# Patient Record
Sex: Male | Born: 1935 | Race: White | Hispanic: No | Marital: Married | Smoking: Never smoker
Health system: Southern US, Community
[De-identification: ages and names within clinical notes are randomized; demographics above are authoritative.]

## PROBLEM LIST (undated history)

## (undated) ENCOUNTER — Ambulatory Visit: Payer: Medicare Other | Source: Home / Self Care

## (undated) DIAGNOSIS — M1990 Unspecified osteoarthritis, unspecified site: Secondary | ICD-10-CM

## (undated) DIAGNOSIS — I4891 Unspecified atrial fibrillation: Secondary | ICD-10-CM

## (undated) DIAGNOSIS — C61 Malignant neoplasm of prostate: Secondary | ICD-10-CM

## (undated) DIAGNOSIS — K5790 Diverticulosis of intestine, part unspecified, without perforation or abscess without bleeding: Secondary | ICD-10-CM

## (undated) DIAGNOSIS — E785 Hyperlipidemia, unspecified: Secondary | ICD-10-CM

## (undated) DIAGNOSIS — U071 COVID-19: Secondary | ICD-10-CM

## (undated) DIAGNOSIS — N200 Calculus of kidney: Secondary | ICD-10-CM

## (undated) DIAGNOSIS — I639 Cerebral infarction, unspecified: Secondary | ICD-10-CM

## (undated) DIAGNOSIS — J189 Pneumonia, unspecified organism: Secondary | ICD-10-CM

## (undated) DIAGNOSIS — K635 Polyp of colon: Secondary | ICD-10-CM

## (undated) HISTORY — DX: Malignant neoplasm of prostate: C61

## (undated) HISTORY — PX: COLONOSCOPY W/ POLYPECTOMY: SHX1380

## (undated) HISTORY — DX: COVID-19: U07.1

## (undated) HISTORY — DX: Diverticulosis of intestine, part unspecified, without perforation or abscess without bleeding: K57.90

## (undated) HISTORY — DX: Polyp of colon: K63.5

## (undated) HISTORY — DX: Calculus of kidney: N20.0

## (undated) HISTORY — DX: Hyperlipidemia, unspecified: E78.5

## (undated) HISTORY — PX: CERVICAL SPINE SURGERY: SHX589

## (undated) HISTORY — PX: RETROPUBIC PROSTATECTOMY: SUR1055

## (undated) HISTORY — PX: OTHER SURGICAL HISTORY: SHX169

## (undated) HISTORY — PX: CATARACT EXTRACTION W/ INTRAOCULAR LENS  IMPLANT, BILATERAL: SHX1307

## (undated) HISTORY — PX: SHOULDER SURGERY: SHX246

---

## 1997-12-08 ENCOUNTER — Emergency Department (HOSPITAL_COMMUNITY): Admission: EM | Admit: 1997-12-08 | Discharge: 1997-12-08 | Payer: Self-pay | Admitting: Emergency Medicine

## 1999-10-10 ENCOUNTER — Ambulatory Visit (HOSPITAL_BASED_OUTPATIENT_CLINIC_OR_DEPARTMENT_OTHER): Admission: RE | Admit: 1999-10-10 | Discharge: 1999-10-10 | Payer: Self-pay | Admitting: Orthopaedic Surgery

## 2004-02-17 ENCOUNTER — Encounter: Payer: Self-pay | Admitting: Internal Medicine

## 2004-02-17 ENCOUNTER — Encounter: Admission: RE | Admit: 2004-02-17 | Discharge: 2004-02-17 | Payer: Self-pay | Admitting: Internal Medicine

## 2005-03-13 ENCOUNTER — Ambulatory Visit (HOSPITAL_COMMUNITY): Admission: RE | Admit: 2005-03-13 | Discharge: 2005-03-13 | Payer: Self-pay | Admitting: Family Medicine

## 2005-07-12 ENCOUNTER — Ambulatory Visit: Payer: Self-pay | Admitting: Internal Medicine

## 2006-04-08 ENCOUNTER — Ambulatory Visit: Payer: Self-pay | Admitting: Internal Medicine

## 2006-05-30 ENCOUNTER — Ambulatory Visit: Payer: Self-pay | Admitting: Internal Medicine

## 2006-10-31 ENCOUNTER — Ambulatory Visit: Payer: Self-pay | Admitting: Internal Medicine

## 2006-10-31 LAB — CONVERTED CEMR LAB
Rhuematoid fact SerPl-aCnc: <20 intl units/mL — ABNORMAL LOW (ref 0.0–20.0)
Sed Rate: 17 mm/hr (ref 0–20)
Total CK: 123 units/L (ref 7–195)
Uric Acid, Serum: 7 mg/dL (ref 2.4–7.0)

## 2006-11-26 ENCOUNTER — Ambulatory Visit: Payer: Self-pay | Admitting: Internal Medicine

## 2007-01-13 ENCOUNTER — Encounter: Payer: Self-pay | Admitting: Internal Medicine

## 2007-01-13 ENCOUNTER — Ambulatory Visit: Payer: Self-pay | Admitting: Internal Medicine

## 2007-08-12 ENCOUNTER — Encounter: Payer: Self-pay | Admitting: Internal Medicine

## 2007-10-09 DIAGNOSIS — Z8739 Personal history of other diseases of the musculoskeletal system and connective tissue: Secondary | ICD-10-CM | POA: Insufficient documentation

## 2007-10-10 ENCOUNTER — Ambulatory Visit: Payer: Self-pay | Admitting: Internal Medicine

## 2007-10-10 ENCOUNTER — Telehealth (INDEPENDENT_AMBULATORY_CARE_PROVIDER_SITE_OTHER): Payer: Self-pay | Admitting: *Deleted

## 2007-10-27 ENCOUNTER — Ambulatory Visit: Payer: Self-pay | Admitting: Internal Medicine

## 2007-10-28 ENCOUNTER — Encounter (INDEPENDENT_AMBULATORY_CARE_PROVIDER_SITE_OTHER): Payer: Self-pay | Admitting: *Deleted

## 2008-01-19 ENCOUNTER — Ambulatory Visit: Payer: Self-pay | Admitting: Internal Medicine

## 2008-01-20 ENCOUNTER — Encounter (INDEPENDENT_AMBULATORY_CARE_PROVIDER_SITE_OTHER): Payer: Self-pay | Admitting: *Deleted

## 2008-01-29 ENCOUNTER — Ambulatory Visit: Payer: Self-pay | Admitting: Pulmonary Disease

## 2008-02-03 ENCOUNTER — Telehealth (INDEPENDENT_AMBULATORY_CARE_PROVIDER_SITE_OTHER): Payer: Self-pay | Admitting: *Deleted

## 2008-05-07 DIAGNOSIS — Z87442 Personal history of urinary calculi: Secondary | ICD-10-CM | POA: Insufficient documentation

## 2008-05-07 DIAGNOSIS — D126 Benign neoplasm of colon, unspecified: Secondary | ICD-10-CM | POA: Insufficient documentation

## 2008-05-07 DIAGNOSIS — K5730 Diverticulosis of large intestine without perforation or abscess without bleeding: Secondary | ICD-10-CM | POA: Insufficient documentation

## 2008-05-10 ENCOUNTER — Ambulatory Visit: Payer: Self-pay | Admitting: Internal Medicine

## 2008-06-21 ENCOUNTER — Ambulatory Visit: Payer: Self-pay | Admitting: Internal Medicine

## 2008-06-21 DIAGNOSIS — Z860100 Personal history of colon polyps, unspecified: Secondary | ICD-10-CM | POA: Insufficient documentation

## 2008-06-21 DIAGNOSIS — Z8546 Personal history of malignant neoplasm of prostate: Secondary | ICD-10-CM | POA: Insufficient documentation

## 2008-06-21 DIAGNOSIS — E785 Hyperlipidemia, unspecified: Secondary | ICD-10-CM

## 2008-06-21 DIAGNOSIS — E1169 Type 2 diabetes mellitus with other specified complication: Secondary | ICD-10-CM | POA: Insufficient documentation

## 2008-06-21 DIAGNOSIS — R9431 Abnormal electrocardiogram [ECG] [EKG]: Secondary | ICD-10-CM | POA: Insufficient documentation

## 2008-06-22 ENCOUNTER — Encounter (INDEPENDENT_AMBULATORY_CARE_PROVIDER_SITE_OTHER): Payer: Self-pay | Admitting: *Deleted

## 2008-06-25 ENCOUNTER — Ambulatory Visit: Payer: Self-pay | Admitting: Cardiology

## 2008-07-07 ENCOUNTER — Encounter: Payer: Self-pay | Admitting: Cardiology

## 2008-07-07 ENCOUNTER — Encounter: Payer: Self-pay | Admitting: Internal Medicine

## 2008-07-07 ENCOUNTER — Ambulatory Visit: Payer: Self-pay

## 2008-07-13 ENCOUNTER — Ambulatory Visit: Payer: Self-pay | Admitting: Cardiology

## 2008-07-28 ENCOUNTER — Ambulatory Visit: Payer: Self-pay | Admitting: Internal Medicine

## 2008-09-03 ENCOUNTER — Encounter: Payer: Self-pay | Admitting: Internal Medicine

## 2008-09-21 ENCOUNTER — Ambulatory Visit: Payer: Self-pay | Admitting: Internal Medicine

## 2008-09-21 ENCOUNTER — Encounter (INDEPENDENT_AMBULATORY_CARE_PROVIDER_SITE_OTHER): Payer: Self-pay | Admitting: *Deleted

## 2008-09-21 LAB — CONVERTED CEMR LAB
OCCULT 1: NEGATIVE
OCCULT 2: NEGATIVE
OCCULT 3: NEGATIVE

## 2008-12-01 ENCOUNTER — Encounter: Payer: Self-pay | Admitting: Internal Medicine

## 2009-04-07 ENCOUNTER — Emergency Department (HOSPITAL_COMMUNITY): Admission: EM | Admit: 2009-04-07 | Discharge: 2009-04-07 | Payer: Self-pay | Admitting: Emergency Medicine

## 2009-04-26 ENCOUNTER — Ambulatory Visit: Payer: Self-pay | Admitting: Internal Medicine

## 2009-06-10 ENCOUNTER — Encounter: Payer: Self-pay | Admitting: Internal Medicine

## 2009-06-21 ENCOUNTER — Emergency Department (HOSPITAL_COMMUNITY): Admission: EM | Admit: 2009-06-21 | Discharge: 2009-06-22 | Payer: Self-pay | Admitting: Emergency Medicine

## 2009-07-14 ENCOUNTER — Encounter: Admission: RE | Admit: 2009-07-14 | Discharge: 2009-07-14 | Payer: Self-pay | Admitting: Family Medicine

## 2009-07-25 ENCOUNTER — Encounter: Admission: RE | Admit: 2009-07-25 | Discharge: 2009-10-23 | Payer: Self-pay | Admitting: Family Medicine

## 2009-10-03 ENCOUNTER — Encounter: Payer: Self-pay | Admitting: Internal Medicine

## 2009-10-07 ENCOUNTER — Encounter: Payer: Self-pay | Admitting: Internal Medicine

## 2009-10-11 ENCOUNTER — Ambulatory Visit: Payer: Self-pay | Admitting: Internal Medicine

## 2009-10-11 ENCOUNTER — Telehealth (INDEPENDENT_AMBULATORY_CARE_PROVIDER_SITE_OTHER): Payer: Self-pay | Admitting: *Deleted

## 2009-10-11 DIAGNOSIS — M109 Gout, unspecified: Secondary | ICD-10-CM | POA: Insufficient documentation

## 2009-10-12 LAB — CONVERTED CEMR LAB: Uric Acid, Serum: 8.8 mg/dL — ABNORMAL HIGH (ref 4.0–7.8)

## 2009-10-13 ENCOUNTER — Inpatient Hospital Stay (HOSPITAL_COMMUNITY): Admission: RE | Admit: 2009-10-13 | Discharge: 2009-10-14 | Payer: Self-pay | Admitting: Neurological Surgery

## 2009-11-03 ENCOUNTER — Encounter: Payer: Self-pay | Admitting: Internal Medicine

## 2009-11-30 ENCOUNTER — Encounter: Payer: Self-pay | Admitting: Internal Medicine

## 2010-04-19 ENCOUNTER — Ambulatory Visit: Payer: Self-pay | Admitting: Internal Medicine

## 2010-06-30 ENCOUNTER — Encounter: Payer: Self-pay | Admitting: Internal Medicine

## 2010-08-20 LAB — CONVERTED CEMR LAB
ALT: 29 units/L (ref 0–53)
AST: 26 units/L (ref 0–37)
Albumin: 4.2 g/dL (ref 3.5–5.2)
Alkaline Phosphatase: 37 units/L — ABNORMAL LOW (ref 39–117)
BUN: 23 mg/dL (ref 6–23)
Basophils Absolute: 0.1 10*3/uL (ref 0.0–0.1)
Basophils Relative: 0.7 % (ref 0.0–3.0)
Bilirubin Urine: NEGATIVE
Bilirubin, Direct: 0.1 mg/dL (ref 0.0–0.3)
Blood in Urine, dipstick: NEGATIVE
CO2: 29 meq/L (ref 19–32)
Calcium: 9.3 mg/dL (ref 8.4–10.5)
Chloride: 106 meq/L (ref 96–112)
Cholesterol, target level: 200 mg/dL
Cholesterol: 179 mg/dL (ref 0–200)
Creatinine, Ser: 1.2 mg/dL (ref 0.4–1.5)
Eosinophils Absolute: 0.2 10*3/uL (ref 0.0–0.7)
Eosinophils Relative: 2.8 % (ref 0.0–5.0)
GFR calc Af Amer: 76 mL/min
GFR calc non Af Amer: 63 mL/min
Glucose, Bld: 99 mg/dL (ref 70–99)
Glucose, Urine, Semiquant: NEGATIVE
HCT: 48.4 % (ref 39.0–52.0)
HDL goal, serum: 40 mg/dL
HDL: 43.2 mg/dL (ref >39.0)
Hemoglobin: 16.9 g/dL (ref 13.0–17.0)
Ketones, urine, test strip: NEGATIVE
LDL Cholesterol: 113 mg/dL — ABNORMAL HIGH (ref 0–99)
LDL Goal: 110 mg/dL
Lymphocytes Relative: 34.4 % (ref 12.0–46.0)
MCHC: 34.9 g/dL (ref 30.0–36.0)
MCV: 92 fL (ref 78.0–100.0)
Monocytes Absolute: 0.6 10*3/uL (ref 0.1–1.0)
Monocytes Relative: 8.5 % (ref 3.0–12.0)
Neutro Abs: 4 10*3/uL (ref 1.4–7.7)
Neutrophils Relative %: 53.6 % (ref 43.0–77.0)
Nitrite: NEGATIVE
Platelets: 175 10*3/uL (ref 150–400)
Potassium: 4.4 meq/L (ref 3.5–5.1)
Protein, U semiquant: 30
RBC: 5.26 M/uL (ref 4.22–5.81)
RDW: 11.9 % (ref 11.5–14.6)
Sodium: 141 meq/L (ref 135–145)
Specific Gravity, Urine: 1.02
TSH: 1.29 microintl units/mL (ref 0.35–5.50)
Total Bilirubin: 1.3 mg/dL — ABNORMAL HIGH (ref 0.3–1.2)
Total CHOL/HDL Ratio: 4.1
Total Protein: 7.5 g/dL (ref 6.0–8.3)
Triglycerides: 116 mg/dL (ref 0–149)
Uric Acid, Serum: 7.7 mg/dL (ref 4.0–7.8)
Urobilinogen, UA: NEGATIVE
VLDL: 23 mg/dL (ref 0–40)
WBC Urine, dipstick: NEGATIVE
WBC: 7.4 10*3/uL (ref 4.5–10.5)
pH: 6

## 2010-08-24 NOTE — Letter (Signed)
Summary: [facility]  [facility]   Imported By: Lanelle Bal 12/15/2009 11:45:02  _____________________________________________________________________  External Attachment:    Type:   Image     Comment:   External Document

## 2010-08-24 NOTE — Assessment & Plan Note (Signed)
Summary: LITTLE THINGS THAT NEED TO BE CUT OFF/YF    History of Present Illness Visit Type: follow up Primary GI MD: Yancey Flemings MD Primary Provider: Marga Melnick, MD Chief Complaint: anal tag History of Present Illness:   77 year old gentleman with a history of prostate cancer, hyperlipidemia, and colon polyps. Also has a history of anal fissure with anal tag. He presents today complains of occasional discomfort or bleeding per rectum as well as the obvious presence of the anal tag and difficulty cleaning the rectum post defecation. He has these problems but once per week. He is on no medical therapy. His last colonoscopy was performed January 13, 2007. At that time he was noted to have multiple diminutive polyps and sigmoid diverticulosis. In addition internal hemorrhoids. Followup in 5 years recommended. Patient has no other symptoms or complaints at this time.             Prior Medications Reviewed Using: Medication Bottles  Updated Prior Medication List: PRAVASTATIN SODIUM 40 MG  TABS (PRAVASTATIN SODIUM) 1 by mouth once daily * IBUPROFEN 600MG  take by mouth as needed * CLARITIN 10MG  1 by mouth once daily * MUCINEX as needed SINGULAIR 10 MG  TABS (MONTELUKAST SODIUM) 1 once daily TYLENOL EX ST ARTHRITIS PAIN 500 MG TABS (ACETAMINOPHEN) Take 4 times weekly prn  Current Allergies: No known allergies   Past Medical History:    Reviewed history from 10/10/2007 and no changes required:       Prostate Cancer       Gout       elevated homocysteine level       elevated lipids  Past Surgical History:    Reviewed history from 05/07/2008 and no changes required:       Left Shoulder surgery       Prostate Biopsy       Radical Retropubic Prostatecomy   Family History:    Reviewed history from 01/29/2008 and no changes required:       cancer: father (kidney) brother (unsure what type)       sister MI age 45       brother MI age 87, diabetes  Social History:    Reviewed  history from 01/29/2008 and no changes required:       Never Smoked       Alcohol use-no       pt is married.       pt has children.     Vital Signs:  Patient Profile:   77 Years Old Male Height:     72 inches Weight:      205.50 pounds BMI:     27.97 Pulse rate:   72 / minute Pulse rhythm:   regular BP sitting:   120 / 78  (left arm)  Vitals Entered By: June McMurray CMA (May 10, 2008 10:03 AM)                  Physical Exam  General:     Well developed, well nourished, no acute distress. Head:     Normocephalic and atraumatic. Eyes:     PERRLA, no icterus. Abdomen:     Soft, nontender and nondistended. No masses, hepatosplenomegaly or hernias noted. Normal bowel sounds. Rectal:     rectal exam reveals a benign anal tag as well as a healed posterior fissure. No other abnormalities. Pulses:     Normal pulses noted. Neurologic:     Alert and  oriented x4;   Psych:  Alert and cooperative. Normal mood and affect.    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  Plan: #1. Daily fiber supplementation with Metamucil 2 tablespoons and 14 ounces of water or juice #2. Anusol suppositories q.h.s. p.r.n. #3. Literature on anal fissure provided #4. If patient fails to respond to medical therapy, consider surgical referral.  Problem # 2:  RECTAL BLEEDING (ICD-569.3) This is due to fissure. Plan as outlined above.  Problem # 3:  DIVERTICULOSIS, COLON (ICD-562.10) Surveillance up-to-date. Due for repeat colonoscopy around June of 2013   Patient Instructions: 1)  Metamucil instructions given to patient in written detail. 2)  Samples of Metamucil and coupon given to patient. 3)  Anusol HC Suppositories sent to pharmacy. 4)  Anal Fissure brochure given.    Prescriptions: ANUSOL-HC 25 MG SUPP (HYDROCORTISONE ACETATE) 1 suppository per rectum at bedtime as needed  #30 x 1   Entered  by:   Milford Cage CMA   Authorized by:   Hilarie Fredrickson MD   Signed by:   Milford Cage CMA on 05/10/2008   Method used:   Electronically to        [id number]* (retail)       [address] [place], [place]  [address]       Ph: [phone]       Fax: [phone]   RxID:   [id number]  ]

## 2010-08-24 NOTE — Assessment & Plan Note (Signed)
Summary: tightness of the chest and cough//PH   Vital Signs:  Patient Profile:   77 Years Old Male Weight:      211.50 pounds Temp:     98.6 degrees F oral Pulse rate:   60 / minute Pulse rhythm:   regular Resp:     17 per minute BP sitting:   120 / 80  (left arm) Cuff size:   large  Pt. in pain?   no  Vitals Entered By: Wendall Stade (October 27, 2007 10:48 AM)                  Chief Complaint:  relapse of symtoms and Cough.  History of Present Illness: some better but it seems it is another relapse, cough with white to yellow mucus chest feels tight fever on and off in the evening and at night  Cough; Rx: Zpack, Hycodan, Mucinex      This is a 77 year old man who presents with Cough.  The patient reports non-productive cough, shortness of breath, and exertional dyspnea, but denies productive cough, pleuritic chest pain, wheezing, fever, hemoptysis, and malaise.  Associated symtpoms include acid reflux symptoms.  The patient denies the following symptoms: cold/URI symptoms, sore throat, nasal congestion, chronic rhinitis, weight loss, and peripheral edema.  The cough is worse with cold exposure.  Risk factors include history of reflux.      Current Allergies (reviewed today): No known allergies   Past Medical History:    Reviewed history from 10/10/2007 and no changes required:       Prostate Cancer       Gout       elevated homocysteine level       elevated lipids  Past Surgical History:    Reviewed history from 10/10/2007 and no changes required:       Left Shoulder surgery       Prostate Biopsy       Radical Retropubic Prost       atecomy   Family History:    Reviewed history from 10/10/2007 and no changes required:       father kidney cancer       sister MI age 13       brother MI age 52, diabetes  Social History:    Reviewed history from 10/10/2007 and no changes required:       Never Smoked       Alcohol use-no    Review of Systems  General       Denies chills, fever, sweats, and weight loss.  Eyes      Denies discharge, eye pain, red eye, and vision loss-both eyes.  ENT      Denies nasal congestion and sinus pressure.      No facial pain or purulence from nose   Physical Exam  General:     Well-developed,well-nourished,in no acute distress; alert,appropriate and cooperative throughout examination Ears:     External ear exam shows no significant lesions or deformities.  Otoscopic examination reveals clear canals, tympanic membranes are intact bilaterally without bulging, retraction, inflammation or discharge. Hearing is grossly normal bilaterally. Some increased  wax bilat Nose:     External nasal examination shows no deformity or inflammation. Nasal mucosa are pink and moist without lesions or exudates. Septum to R Mouth:     Oral mucosa and oropharynx without lesions or exudates.  Teeth in good repair. Lungs:     Normal respiratory effort, chest expands symmetrically. Lungs are  clear to auscultation, no crackles or wheezes. Cervical Nodes:     No lymphadenopathy noted Axillary Nodes:     No palpable lymphadenopathy    Impression & Recommendations:  Problem # 1:  BRONCHITIS-ACUTE (ICD-466.0)  The following medications were removed from the medication list:    Azithromycin 250 Mg Tabs (Azithromycin) .[name]... As per pack    Hydrocodone-homatropine 5-1.5 Mg/43ml Syrp (Hydrocodone-homatropine) .[name]... 1 tsp q 6-8 hrs prn  His updated medication list for this problem includes:    Avelox Abc Pack 400 Mg Tabs (Moxifloxacin hcl) .[name]... 1 once daily    Qvar 40 Mcg/act Aers (Beclomethasone dipropionate) .[name]... 2 inhalations every 12 hrs ; gargle & swallow after use  Orders: T-2 View CXR, Same Day (71020.5TC)   Complete Medication List: 1)  Pravastatin Sodium 40 Mg Tabs (Pravastatin sodium) .[name].. 1 by mouth once daily 2)  Ibuprofen 600mg   .... 1 by mouth three times a day 3)  Claritin 10mg   .... 1 by mouth once daily 4)   Mucinex  .... As needed 5)  Avelox Abc Pack 400 Mg Tabs (Moxifloxacin hcl) .[name].. 1 once daily 6)  Qvar 40 Mcg/act Aers (Beclomethasone dipropionate) .... 2 inhalations every 12 hrs ; gargle & swallow after use   Patient Instructions: 1)  Drink as much fluid as you can tolerate for the next few days. Xray @ [address]    Prescriptions: QVAR 40 MCG/ACT  AERS (BECLOMETHASONE DIPROPIONATE) 2 inhalations every 12 hrs ; gargle & swallow after use  #1 x 1   Entered and Authorized by:   Marga Melnick MD   Signed by:   Marga Melnick MD on 10/27/2007   Method used:   Print then Give to Patient   RxID:   [id number] AVELOX ABC PACK 400 MG  TABS (MOXIFLOXACIN HCL) 1 once daily  #5 x 0   Entered and Authorized by:   Marga Melnick MD   Signed by:   Marga Melnick MD on 10/27/2007   Method used:   Print then Give to Patient   RxID:   [id number]  ]

## 2010-08-24 NOTE — Procedures (Signed)
Summary: Gastroenterology--COLON  Gastroenterology--COLON   Imported By: Freddy Jaksch 01/21/2007 16:55:24  _____________________________________________________________________  External Attachment:    Type:   Image     Comment:   colon

## 2010-08-24 NOTE — Assessment & Plan Note (Signed)
Summary: acute/cough,achy/alr   Vital Signs:  Patient Profile:   78 Years Old Male Weight:      213.25 pounds Temp:     97.7 degrees F oral Pulse rate:   64 / minute Pulse rhythm:   regular Resp:     17 per minute BP sitting:   100 / 60  (left arm) Cuff size:   large  Pt. in pain?   no  Vitals Entered By: Wendall Stade (October 10, 2007 11:58 AM)                  Chief Complaint:  sick for three days and URI symptoms.  History of Present Illness: sick for three days fever at night post nasal drip esp at night with clear mucus coughing but nonproductive chest congestion took one of wife's lipitor and I cautioned him not to do that  URI Symptoms; Rx: ibuprofen, Mucinex DM      This is a 77 year old man who presents with URI symptoms.  Only muscle ache = Rgastroc tear.  The patient reports nasal congestion, clear nasal discharge, dry cough, and sick contacts, but denies purulent nasal discharge, sore throat, productive cough, and earache.  Associated symptoms include fever, low-grade fever (<100.5 degrees), wheezing, and use of an antipyretic.  The patient denies stiff neck, dyspnea, rash, vomiting, diarrhea, and response to antipyretic.  The patient denies itchy watery eyes, itchy throat, sneezing, seasonal symptoms, response to antihistamine, headache, muscle aches, and severe fatigue.  The patient denies the following risk factors for Strep sinusitis: double sickening, tooth pain, Strep exposure, and tender adenopathy.      Current Allergies (reviewed today): No known allergies   Past Medical History:    Reviewed history from 10/09/2007 and no changes required:       Prostate Cancer       Gout       elevated homocysteine level       elevated lipids  Past Surgical History:    Reviewed history from 10/09/2007 and no changes required:       Left Shoulder surgery       Prostate Biopsy       Radical Retropubic Prost       atecomy   Family History:    Reviewed  history and no changes required:       father kidney cancer       sister MI age 52       brother MI age 68, diabetes  Social History:    Reviewed history and no changes required:       Never Smoked       Alcohol use-no   Risk Factors:  Tobacco use:  never Alcohol use:  no    Physical Exam  General:     Well-developed,well-nourished,in no acute distress; alert,appropriate and cooperative throughout examination Eyes:     No corneal or conjunctival inflammation noted. EOMI. Perrla.  Vision grossly normal. Ears:     External ear exam shows no significant lesions or deformities.  Otoscopic examination reveals clear canals, tympanic membranes are dull  without bulging, retraction, inflammation or discharge. Hearing is grossly slightly reduced Nose:     External nasal examination shows no deformity or inflammation. Nasal mucosa are dry; septum to R w/o  lesions or exudates. Mouth:     Oral mucosa and oropharynx without lesions or exudates.  Teeth in good repair.Slightly hoarse Lungs:     Normal respiratory effort, chest expands symmetrically. Lungs :minor  crackles. Skin:     Intact without suspicious lesions or rashes Cervical Nodes:     No lymphadenopathy noted Axillary Nodes:     No palpable lymphadenopathy    Impression & Recommendations:  Problem # 1:  BRONCHITIS-ACUTE (ICD-466.0)  His updated medication list for this problem includes:    Azithromycin 250 Mg Tabs (Azithromycin) .[name]... As per pack    Hydrocodone-homatropine 5-1.5 Mg/75ml Syrp (Hydrocodone-homatropine) .[name]... 1 tsp q 6-8 hrs prn   Problem # 2:  URI (ICD-465.9)  The following medications were removed from the medication list:    Indocin 50 Mg Supp (Indomethacin) ..... Use three times a day as needed  His updated medication list for this problem includes:    Hydrocodone-homatropine 5-1.5 Mg/65ml Syrp (Hydrocodone-homatropine) .[name]... 1 tsp q 6-8 hrs prn   Complete Medication List: 1)  Pravastatin  Sodium 40 Mg Tabs (Pravastatin sodium) .[name].. 1 by mouth once daily out for one month 2)  Ibuprofen 600mg   .... 1 by mouth three times a day 3)  Claritin 10mg   .... 1 by mouth once daily 4)  Mucinex  .... As needed 5)  Azithromycin 250 Mg Tabs (Azithromycin) .... As per pack 6)  Hydrocodone-homatropine 5-1.5 Mg/33ml Syrp (Hydrocodone-homatropine) .[name].. 1 tsp q 6-8 hrs prn   Patient Instructions: 1)  Drink as much fluid as you can tolerate for the next few days.Neti pot once daily for nasal congestion    Prescriptions: HYDROCODONE-HOMATROPINE 5-1.5 MG/5ML  SYRP (HYDROCODONE-HOMATROPINE) 1 tsp q 6-8 hrs prn  #120cc x 0   Entered and Authorized by:   Marga Melnick MD   Signed by:   Marga Melnick MD on 10/10/2007   Method used:   Print then Give to Patient   RxID:   [id number] AZITHROMYCIN 250 MG  TABS (AZITHROMYCIN) as per pack  #1 pack x 0   Entered and Authorized by:   Marga Melnick MD   Signed by:   Marga Melnick MD on 10/10/2007   Method used:   Print then Give to Patient   RxID:   [id number]  ]

## 2010-08-24 NOTE — Progress Notes (Signed)
   Faxed all Cardiac over to [facility]/ [facility] Pre- Admit to fax [phone] [facility]  October 11, 2009 2:45 PM

## 2010-08-24 NOTE — Letter (Signed)
Summary: [facility]  [facility]   Imported By: Lanelle Bal 07/19/2010 10:35:45  _____________________________________________________________________  External Attachment:    Type:   Image     Comment:   External Document

## 2010-08-24 NOTE — Letter (Signed)
Summary: [facility]  [facility]   Imported By: Lanelle Bal 10/22/2009 11:34:45  _____________________________________________________________________  External Attachment:    Type:   Image     Comment:   External Document

## 2010-08-24 NOTE — Letter (Signed)
Summary: Results Follow up Letter  [place] [facility] [place]  [address] [place], [place] [address]   Phone: [phone]  Fax: [phone]    09/21/2008 MRN: [id number]  Chad Norris [address] [place], [place]  [address]  Dear [name],  The following are the results of your recent test(s):  Test         Result    Pap Smear:        Normal _____  Not Normal _____ Comments: ______________________________________________________ Cholesterol: LDL(Bad cholesterol):         Your goal is less than:         HDL (Good cholesterol):       Your goal is more than: Comments:  ______________________________________________________ Mammogram:        Normal _____  Not Normal _____ Comments:  ___________________________________________________________________ Hemoccult:        Normal _X____  Not normal _______ Comments:    _____________________________________________________________________ Other Tests:    We routinely do not discuss normal results over the telephone.  If you desire a copy of the results, or you have any questions about this information we can discuss them at your next office visit.   Sincerely,

## 2010-08-24 NOTE — Letter (Signed)
Summary: [facility]  [facility]   Imported By: Lanelle Bal 06/22/2009 13:57:50  _____________________________________________________________________  External Attachment:    Type:   Image     Comment:   External Document

## 2010-08-24 NOTE — Letter (Signed)
Summary: [facility]  [facility]   Imported By: Lanelle Bal 11/16/2009 14:26:53  _____________________________________________________________________  External Attachment:    Type:   Image     Comment:   External Document

## 2010-08-24 NOTE — Assessment & Plan Note (Signed)
Summary: flu shot/cbs   Nurse Visit   Allergies: No Known Drug Allergies  Orders Added: 1)  Admin 1st Vaccine [90471] 2)  Flu Vaccine 72yrs + [04540] Flu Vaccine Consent Questions     Do you have a history of severe allergic reactions to this vaccine? no    Any prior history of allergic reactions to egg and/or gelatin? no    Do you have a sensitivity to the preservative Thimersol? no    Do you have a past history of Guillan-Barre Syndrome? no    Do you currently have an acute febrile illness? no    Have you ever had a severe reaction to latex? no    Vaccine information given and explained to patient? yes    Are you currently pregnant? no    Lot Number:[id number]   Exp Date:01/20/2011   Site Given  Left Deltoid IM .lbflu

## 2010-08-24 NOTE — Progress Notes (Signed)
Summary: sick  Phone Note Call from Patient Call back at Home Phone [phone]   Caller: Spouse Reason for Call: Acute Illness Summary of Call: dr. hopper patient has a deep cough, chills, weak, achy, and patient has been taking musenix and that is not helping.  Initial call taken by: Charolette Child,  October 10, 2007 8:50 AM  Follow-up for Phone Call        spoke with pt wife c/o deep cough given him mucinex,claritin, not helping ov sched today...................................................................Marland KitchenKandice Hams  October 10, 2007 10:04 AM   Follow-up by: Kandice Hams,  October 10, 2007 10:04 AM

## 2010-08-24 NOTE — Assessment & Plan Note (Signed)
Summary: ? gout//fd   Vital Signs:  Patient profile:   77 year old male Weight:      209.6 pounds BMI:     29.13 Temp:     98.5 degrees F oral Pulse rate:   60 / minute Resp:     16 per minute BP sitting:   136 / 84  (left arm) Cuff size:   large  Vitals Entered By: Shonna Chock (October 11, 2009 9:52 AM) CC: Left big toe sore and stiff-? Gout, would like a RX for Indomethacin Comments REVIEWED MED LIST, PATIENT AGREED DOSE AND INSTRUCTION CORRECT    Primary Care Provider:  Marga Melnick, MD  CC:  Left big toe sore and stiff-? Gout and would like a RX for Indomethacin.  History of Present Illness: Ten- 14  days ago he ate  increased amount of shrimp @ beach & again  @ local restaurant  late last week. This am he woke with L great toe stiffness, pain, redness & redness. ZO:XWRU. PMH of gout.Not on HCTZ.  Allergies (verified): No Known Drug Allergies  Review of Systems General:  Denies chills, fever, and sweats. MS:  Complains of joint pain, joint redness, joint swelling, and stiffness. Derm:  Complains of changes in color of skin; denies lesion(s) and rash.  Physical Exam  General:  in no acute distress; alert,appropriate and cooperative throughout examination Pulses:  R and L posterior tibial pulses are full and equal bilaterally. DecreasedDPP w/o ischemic change Extremities:  No clubbing, cyanosis, edema, or deformity noted with normal full range of motion of all joints.   L great toe red , blanching, tender  distally/ medially Skin:  Erythema with blanching L great toe   Impression & Recommendations:  Problem # 1:  ACUTE GOUTY ARTHROPATHY (ICD-274.01)  Orders: Venipuncture (04540) TLB-Uric Acid, Blood (84550-URIC) Prescription Created Electronically [phone])  Complete Medication List: 1)  Pravastatin Sodium 40 Mg Tabs (Pravastatin sodium) .[name].. 1 by mouth once daily 2)  Ibuprofen 600mg   .... Take by mouth as needed 3)  Multivitamins Tabs (Multiple vitamin) .[name].. 1  by mouth once daily 4)  Vitamin D 1000 Unit Tabs (Cholecalciferol) .[name].. 1 by mouth once daily 5)  Indomethacin 50 Mg Caps (Indomethacin) .[name].. 1 three times a day with meals as needed  Patient Instructions: 1)  Go to Web MD for information on GOUT. High Fructose Corn Syrup in foods & drinks will also raise uric acid .  Prescriptions: INDOMETHACIN 50 MG CAPS (INDOMETHACIN) 1 three times a day with meals as needed  #30 x 1   Entered and Authorized by:   Marga Melnick MD   Signed by:   Marga Melnick MD on 10/11/2009   Method used:   Faxed to ...       [facility] [phone]* (retail)       [address] [place], [place]  [address]       Ph: [phone]       Fax: [phone]   RxID:   [id number]   Appended Document: ? gout//fd Called in RX to the pharmacy-didnt go through electronically

## 2010-08-24 NOTE — Assessment & Plan Note (Signed)
Summary: DRY COUGH//VGJ   Vital Signs:  Patient Profile:   77 Years Old Male Weight:      206 pounds Temp:     98.3 degrees F oral Pulse rate:   62 / minute Resp:     14 per minute BP sitting:   118 / 72  (left arm)  Vitals Entered By: Doristine Devoid (January 19, 2008 4:47 PM)                 Chief Complaint:  COUGH X4 DAYS YELLOW MUCOUS AND CHEST TIGHTNESS and Cough.  History of Present Illness:  Cough; Rx: Mucinex      This is a 77 year old man who presents with Cough.  This similar to episode in 4/09. Some sputum, clear to yellow.  The patient denies productive cough, non-productive cough, pleuritic chest pain, shortness of breath, wheezing, exertional dyspnea, fever, hemoptysis, and malaise.  Associated symtpoms include sore throat.  The patient denies the following symptoms: cold/URI symptoms, nasal congestion, chronic rhinitis, weight loss, acid reflux symptoms, and peripheral edema.  The cough is worse with lying down.  Ineffective prior treatments have included OTC cough medication.      Current Allergies: No known allergies      Review of Systems  Eyes      Denies discharge, eye pain, and red eye.  ENT      Denies earache, nasal congestion, and sinus pressure.  Resp      Complains of excessive snoring.      Wife questions Sleep Apnea   Physical Exam  General:     Well-developed,well-nourished,in no acute distress; alert,appropriate and cooperative throughout examination; appears younger Eyes:     No corneal or conjunctival inflammation noted. EOMI. Perrla.  Ears:     External ear exam shows no significant lesions or deformities.  Otoscopic examination reveals clear canals, tympanic membranes are intact bilaterally without bulging, retraction, inflammation or discharge. Hearing is grossly normal bilaterally. Nose:     External nasal examination shows no deformity or inflammation. Nasal mucosa are pink and moist without lesions or exudates. Mouth:  Oral mucosa and oropharynx without lesions or exudates.  Mild erythema Lungs:     Normal respiratory effort, chest expands symmetrically. Lungs : mild rhonchi & crackles. Cervical Nodes:     No lymphadenopathy noted Axillary Nodes:     No palpable lymphadenopathy    Impression & Recommendations:  Problem # 1:  ACUTE BRONCHITIS (ICD-466.0)  The following medications were removed from the medication list:    Avelox Abc Pack 400 Mg Tabs (Moxifloxacin hcl) .[name]... 1 once daily    Qvar 40 Mcg/act Aers (Beclomethasone dipropionate) .[name]... 2 inhalations every 12 hrs ; gargle & swallow after use  His updated medication list for this problem includes:    Amoxicillin-pot Clavulanate 500-125 Mg Tabs (Amoxicillin-pot clavulanate) .[name]... 1  q 12 hrs with a meal    Singulair 10 Mg Tabs (Montelukast sodium) .[name]... 1 once daily    Promethazine-codeine 6.25-10 Mg/74ml Syrp (Promethazine-codeine) .[name]... 1 tsp q 6 hrs prn   Problem # 2:  SLEEP APNEA (ICD-780.57)  Orders: Sleep Disorder Referral (Sleep Disorder)   Complete Medication List: 1)  Pravastatin Sodium 40 Mg Tabs (Pravastatin sodium) .[name].. 1 by mouth once daily 2)  Ibuprofen 600mg   .... 1 by mouth three times a day 3)  Claritin 10mg   .... 1 by mouth once daily 4)  Mucinex  .... As needed 5)  Amoxicillin-pot Clavulanate 500-125 Mg Tabs (Amoxicillin-pot clavulanate) .Marland KitchenMarland KitchenMarland Kitchen  1  q 12 hrs with a meal 6)  Singulair 10 Mg Tabs (Montelukast sodium) .[name].. 1 once daily 7)  Promethazine-codeine 6.25-10 Mg/90ml Syrp (Promethazine-codeine) .[name].. 1 tsp q 6 hrs prn   Patient Instructions: 1)  Drink as much fluid as you can tolerate for the next few days.   Prescriptions: PROMETHAZINE-CODEINE 6.25-10 MG/5ML  SYRP (PROMETHAZINE-CODEINE) 1 tsp q 6 hrs prn  #120 cc x 0   Entered and Authorized by:   Marga Melnick MD   Signed by:   Marga Melnick MD on 01/19/2008   Method used:   Print then Give to Patient   RxID:   [id number] SINGULAIR 10 MG  TABS  (MONTELUKAST SODIUM) 1 once daily  #28 x 0   Entered and Authorized by:   Marga Melnick MD   Signed by:   Marga Melnick MD on 01/19/2008   Method used:   Print then Give to Patient   RxID:   [id number] AMOXICILLIN-POT CLAVULANATE 500-125 MG  TABS (AMOXICILLIN-POT CLAVULANATE) 1  q 12 hrs with a meal  #20 x 0   Entered and Authorized by:   Marga Melnick MD   Signed by:   Marga Melnick MD on 01/19/2008   Method used:   Print then Give to Patient   RxID:   [id number]  ]

## 2010-08-24 NOTE — Assessment & Plan Note (Signed)
Summary: yearly check up   Vital Signs:  Patient Profile:   77 Years Old Norris Height:     71.25 inches Weight:      203.2 pounds Temp:     97.9 degrees F oral Pulse rate:   60 / minute Resp:     14 per minute BP sitting:   112 / 78  (left arm) Cuff size:   large  Pt. in pain?   no  Vitals Entered By: Shonna Chock (June 21, 2008 11:09 AM)                   Referred by:  Marga Melnick PCP:  Marga Melnick, MD  Chief Complaint:  CPX / Medicare Complete.  History of Present Illness: He saw Dr Marina Goodell for rectal "polyps"; surgery deferred due to risk of infection. Note reviewed ; "benign anal tag & fissure". Adenomatous polyp @ colonoscopy 2008; repeat 2013.  General Medical HPI:        Currently he is doing well and is not having any significant new complaints.  Lipid Management History:      Positive NCEP/ATP III risk factors include Norris age 37 years old or older and family history for ischemic heart disease (males less than 54 years old).  Negative NCEP/ATP III risk factors include non-diabetic, non-tobacco-user status, non-hypertensive, no ASHD (atherosclerotic heart disease), no prior stroke/TIA, no peripheral vascular disease, and no history of aortic aneurysm.       Current Allergies (reviewed today): No known allergies   Past Medical History:    Prostate Cancer, Dr Laverle Patter    Gout    elevated homocysteine level    Colonic polyps, hx of, Dr Marina Goodell    Diverticulosis, colon    Hyperlipidemia    Nephrolithiasis, hx of  Past Surgical History:    Left Shoulder surgery    Radical Retropubic Prostatectomy, [facility] 2003    Colon polypectomy     Cataract extraction & implants bilat   Family History:    cancer: father (kidney) brother (unsure what type)    sister MI age 29    brother MI age 26, diabetes ; mother d @ 95  Social History:    Never Smoked    Alcohol use-no    pt is married.    Regular exercise-yes   Risk Factors:  Exercise:  yes   Family History Risk Factors:    Family History of MI in females < 37 years old:  no    Family History of MI in males < 33 years old:  yes   Review of Systems       The patient complains of vision loss and decreased hearing.  The patient denies anorexia, fever, weight loss, weight gain, hoarseness, syncope, prolonged cough, headaches, hemoptysis, abdominal pain, melena, hematochezia, severe indigestion/heartburn, hematuria, incontinence, muscle weakness, suspicious skin lesions, difficulty walking, depression, unusual weight change, abnormal bleeding, enlarged lymph nodes, and angioedema.         PSA was 0.00 by Dr Laverle Patter in 01/09  Eyes      Complains of blurring.      OD vision "foggy"; surgery next week by Dr Nile Riggs  CV      Denies bluish discoloration of lips or nails, chest pain or discomfort, difficulty breathing at night, difficulty breathing while lying down, leg cramps with exertion, palpitations, shortness of breath with exertion, swelling of feet, and swelling of hands.   Physical Exam  General:     well-nourished,in no acute  distress; alert,appropriate and cooperative throughout examination; appears younger than age Head:     Normocephalic and atraumatic without obvious abnormalities.  Eyes:     No corneal or conjunctival inflammation noted.Perrla. Funduscopic exam benign, without hemorrhages, exudates or papilledema.  Ears:     External ear exam shows no significant lesions or deformities.  Otoscopic examination reveals clear canals, tympanic membranes are intact bilaterally without bulging, retraction, inflammation or discharge. Hearing is grossly normal bilaterally. Nose:     External nasal examination shows no deformity or inflammation. Nasal mucosa are pink and moist without lesions or exudates. Mouth:     Oral mucosa and oropharynx without lesions or exudates.  Teeth in good repair. Neck:     No deformities, masses, or tenderness noted. Lungs:     Normal  respiratory effort, chest expands symmetrically. Lungs are clear to auscultation, no crackles or wheezes. Heart:     Heart sounds distant. Occa prematurenormal rate.   Abdomen:     Bowel sounds positive,abdomen soft and non-tender without masses, organomegaly or hernias noted. Rectal:      Single external tag  w/o inflammation Genitalia:     as per Dr Laverle Patter Prostate:     see PSA value  ( 0.00) Msk:     No deformity or scoliosis noted of thoracic or lumbar spine.   Pulses:     R and L carotid,radial,dorsalis pedis and posterior tibial pulses are full and equal bilaterally Extremities:     No clubbing, cyanosis, edema, or deformity noted  Neurologic:     alert & oriented X3 and DTRs symmetrical and normal.   Skin:     Intact without suspicious lesions or rashes Cervical Nodes:     No lymphadenopathy noted Axillary Nodes:     No palpable lymphadenopathy Psych:     memory intact for recent and remote, normally interactive, and good eye contact.      Impression & Recommendations:  Problem # 1:  ROUTINE GENERAL MEDICAL EXAM@HEALTH  CARE FACL (ICD-V70.0)  Orders: UA Dipstick w/o Micro (manual) ([address]) EKG w/ Interpretation (93000) Venipuncture (60454) Cardiology Referral (Cardiology) TLB-Lipid Panel (80061-LIPID) TLB-BMP (Basic Metabolic Panel-BMET) (80048-METABOL) TLB-CBC Platelet - w/Differential (85025-CBCD) TLB-Hepatic/Liver Function Pnl (80076-HEPATIC) TLB-TSH (Thyroid Stimulating Hormone) (84443-TSH) TLB-Uric Acid, Blood (84550-URIC)   Problem # 2:  HYPERLIPIDEMIA (ICD-272.4)  His updated medication list for this problem includes:    Pravastatin Sodium 40 Mg Tabs (Pravastatin sodium) .[name]... 1 by mouth once daily  Orders: Venipuncture (09811)   Problem # 3:  PROSTATE CANCER, HX OF (ICD-V10.46) as per Dr Laverle Patter Orders: Venipuncture [phone])   Problem # 4:  COLONIC POLYPS, HX OF (ICD-V12.72) Assessment: Unchanged as per Dr Marina Goodell Orders: Venipuncture (29562)    Problem # 5:  NEPHROLITHIASIS, HX OF (ICD-V13.01) PMH of, 1952  Problem # 6:  GOUT (ICD-274.9)  Orders: TLB-Uric Acid, Blood (84550-URIC)   Problem # 7:  NONSPECIFIC ABNORMAL ELECTROCARDIOGRAM (ICD-794.31) loss of T voltage inferolaterally since 2007 Orders: Cardiology Referral (Cardiology)   Complete Medication List: 1)  Pravastatin Sodium 40 Mg Tabs (Pravastatin sodium) .[name].. 1 by mouth once daily 2)  Ibuprofen 600mg   .... Take by mouth as needed 3)  Mucinex  .... As needed 4)  Singulair 10 Mg Tabs (Montelukast sodium) .[name].. 1 once daily 5)  Tylenol Ex St Arthritis Pain 500 Mg Tabs (Acetaminophen) .... Take 4 times weekly prn 6)  Anusol-hc 25 Mg Supp (Hydrocortisone acetate) .[name].. 1 suppository per rectum at bedtime as needed 7)  Multivitamins Tabs (Multiple vitamin) .Marland KitchenMarland KitchenMarland Kitchen  1 by mouth once daily 8)  Vitamin D 1000 Unit Tabs (Cholecalciferol) .[name].. 1 by mouth once daily  Lipid Assessment/Plan:      Based on NCEP/ATP III, the patient's risk factor category is "2 or more risk factors and a calculated 10 year CAD risk of < 20%".  The patient's lipid goals have been set as follows: Total cholesterol goal is 200; LDL cholesterol goal is 110; HDL cholesterol goal is 40; Triglyceride goal is 150.  His LDL cholesterol goal has been met.     Patient Instructions: 1)  Complete Cardiolgy consultation to evaluate the non specific EKG changes which are asymptomatic but  new since 2007   ] Laboratory Results   Urine Tests   Date/Time Reported: June 21, 2008 11:33 AM   Routine Urinalysis   Color: orange Appearance: Clear Glucose: negative   (Normal Range: Negative) Bilirubin: negative   (Normal Range: Negative) Ketone: negative   (Normal Range: Negative) Spec. Gravity: 1.020   (Normal Range: 1.003-1.035) Blood: negative   (Normal Range: Negative) pH: 6.0   (Normal Range: 5.0-8.0) Protein: 30   (Normal Range: Negative) Urobilinogen: negative   (Normal Range: 0-1) Nitrite:  negative   (Normal Range: Negative) Leukocyte Esterace: negative   (Normal Range: Negative)    CommentsFloydene Flock CMA  June 21, 2008 11:34 AM

## 2010-08-24 NOTE — Assessment & Plan Note (Signed)
Summary: consult for osa   Referred by:  Marga Melnick PCP:  Alwyn Ren  Chief Complaint:  Sleep Consult.  History of Present Illness: the patient is a 77 year old male who I've been asked to see for possible obstructive sleep apnea. The patient's wife has noticed loud snoring, as well as an abnormal respiratory pattern during sleep. The patient typically gets to bed between 10:30 and 11:30, and arises at 7 AM to start his day. Most of the time he feels fairly rested upon awakening. He does admit to getting up 3-5 times a night, and part of this is to let out his elderly dog to go to the bathroom. The patient denies any snoring or choking arousals. The patient is fairly satisfied with his degree of alertness with periods of inactivity. He does not feel significant sleep pressure with most things, but can occasionally doze with reading or watching television. He has no sleepiness with driving short distances, but occasionally will have sleep pressure with the longer ones. Of note, the patient's weight has been neutral over the last 2 years, and his Epworth sleepiness score today is 13.     Current Allergies: No known allergies   Past Medical History:    Reviewed history from 10/10/2007 and no changes required:       Prostate Cancer       Gout       elevated homocysteine level       elevated lipids  Past Surgical History:    Reviewed history from 10/10/2007 and no changes required:       Left Shoulder surgery       Prostate Biopsy       Radical Retropubic Prost       atecomy   Family History:    cancer: father (kidney) brother (unsure what type)    sister MI age 22    brother MI age 38, diabetes  Social History:    Never Smoked    Alcohol use-no    pt is married.    pt has children.   Risk Factors: Tobacco use:  never Alcohol use:  no   Review of Systems      See HPI   Vital Signs:  Patient Profile:   77 Years Old Male Weight:      207.50 pounds O2 Sat:      98 %  O2 treatment:    Room Air Temp:     97.6 degrees F oral Pulse rate:   53 / minute BP sitting:   146 / 82  (left arm) Cuff size:   regular  Vitals Entered By: Cyndia Diver LPN (January 28, 4741 11:20 AM)             Is Patient Diabetic? No Comments Medications reviewed with patient Cyndia Diver LPN  January 29, 5955 11:30 AM      Physical Exam  General:     well developed male in no acute distress Eyes:     PERRLA and EOMI.   Nose:     mild septal deviation to the right, but patent Mouth:     elongation of the soft palate, with a normal uvula Neck:     no JVD, thyromegaly, or lymphadenopathy. Lungs:     totally clear to auscultation Heart:     regular rate and rhythm, no MRG Abdomen:     soft and nontender, bowel sounds present Extremities:     no significant edema, pulses intact distally Neurologic:  alert and oriented, moves all 4 extremities.      Impression & Recommendations:  Problem # 1:  SLEEP APNEA (ICD-780.57) it is really unclear from the patient's history whether he may have clinically significant sleep disordered breathing. There is certainly historical information which may support the diagnosis, but it does not appear to be impacting his quality of life. The patient has noted significant comorbid medical illnesses, and only has modest weight to lose. At this point, I think a compromise with the patient would be best. Will set up a screening study with an apnea link device, and see what kind of information that we can obtain. The patient is willing to go through formal sleep study if the screening exam is suggestive of sleep apnea.  Medications Added to Medication List This Visit: 1)  Ibuprofen 600mg   .... Take by mouth as needed   Patient Instructions: 1)  will set up for apnea link screening 2)  I will call when results available.   ]

## 2010-08-24 NOTE — Progress Notes (Signed)
Summary: lab work & ct scan  Phone Note Call from Patient Call back at Home Phone [phone]   Caller: Spouse Reason for Call: Talk to Nurse Summary of Call: Dr. Alwyn Ren Patient has not ate breakfast as of yet....wife wanted to get him checked for cancer again since he had  prostate cancer six years ago. Wife would also like him to have a Cat Scan to check him as well. Wife states that since he has had congestion since april. Chad Norris has been on three axb.   Initial call taken by: Charolette Child,  February 03, 2008 8:33 AM  Follow-up for Phone Call        PRINTED AND PLACE ON LEDGE FOR DR.HOPPER TO ADVISE Follow-up by: Chad Norris,  February 03, 2008 8:58 AM  Additional Follow-up for Phone Call Additional follow up Details #1::        PSA is prostate CA screening test (code :185,PMH of). Please schedule appt with Dr Shelle Iron concerning protracted cough, he is Pul Specialist Additional Follow-up by: Marga Melnick MD,  February 03, 2008 9:02 AM    Additional Follow-up for Phone Call Additional follow up Details #2::    SPOKE WITH WIFE, PATIENT SEEN DR.CLANCE LAST WEEK AND THEY ARE ADDRESSING SLEEP APNEA, WILL HAVE MACHINE AT HOME TO MONITOR PATIENT(FYI) WIFE WILL CALL THEM BACK TO SEE IF THEY CAN ORDER LUNG SCAN. PSA WAS CHECKED AND NOT DUE TO BE RECHECKED UNTIL 04/2008 (PER WIFE). HER MAIN CONCERN IS HIS LUNGS AND SHE WILL CONTACT DR.CLANCE. Follow-up by: Chad Norris,  February 03, 2008 9:09 AM

## 2010-08-24 NOTE — Letter (Signed)
Summary: [facility]  [facility]   Imported By: Lanelle Bal 10/19/2009 13:04:16  _____________________________________________________________________  External Attachment:    Type:   Image     Comment:   External Document

## 2010-08-24 NOTE — Letter (Signed)
Summary: Results Follow-up Letter  [facility] [place]  [address] [place], [place] [address]   Phone: [phone]  Fax: [phone]    06/22/2008        [name] [address] [place], [place]  [address]  Dear [name],   The following are the results of your recent test(s):  Test     Result     Pap Smear    Normal_______  Not Normal_____       Comments: _________________________________________________________ Cholesterol LDL(Bad cholesterol):          Your goal is less than:         HDL (Good cholesterol):        Your goal is more than: _________________________________________________________ Other Tests:   _________________________________________________________  Please call for an appointment Or __Please see attached labwork._______________________________________________________ _________________________________________________________ _________________________________________________________  Sincerely,  Ardyth Man Carlton at [facility]

## 2010-08-24 NOTE — Letter (Signed)
Summary: [facility]  [facility]   Imported By: Lanelle Bal 09/10/2008 12:39:25  _____________________________________________________________________  External Attachment:    Type:   Image     Comment:   External Document

## 2010-08-24 NOTE — Assessment & Plan Note (Signed)
Summary: h1n1.cbs   Nurse Visit    Prior Medications: PRAVASTATIN SODIUM 40 MG  TABS (PRAVASTATIN SODIUM) 1 by mouth once daily IBUPROFEN 600MG  () take by mouth as needed MUCINEX () as needed SINGULAIR 10 MG  TABS (MONTELUKAST SODIUM) 1 once daily TYLENOL EX ST ARTHRITIS PAIN 500 MG TABS (ACETAMINOPHEN) Take 4 times weekly prn ANUSOL-HC 25 MG SUPP (HYDROCORTISONE ACETATE) 1 suppository per rectum at bedtime as needed MULTIVITAMINS  TABS (MULTIPLE VITAMIN) 1 by mouth once daily VITAMIN D 1000 UNIT TABS (CHOLECALCIFEROL) 1 by mouth once daily Current Allergies: No known allergies    H1N1 # 1    Vaccine Type: H1N1 vaccine G code    Site: right deltoid    Mfr: [employer]i Pasteur    Dose: 0.5 ml    Route: IM    Given by: Floydene Flock CMA    Exp. Date: 11/28/2009    Lot #: [id number]   Orders Added: 1)  H1N1 vaccine G code [email or web address]    ]

## 2010-08-24 NOTE — Letter (Signed)
Summary: [facility]  [facility]   Imported By: Lanelle Bal 12/23/2008 10:22:43  _____________________________________________________________________  External Attachment:    Type:   Image     Comment:   External Document

## 2010-10-16 LAB — CBC
HCT: 43.2 % (ref 39.0–52.0)
Hemoglobin: 14.9 g/dL (ref 13.0–17.0)
MCHC: 34.5 g/dL (ref 30.0–36.0)
MCV: 93.3 fL (ref 78.0–100.0)
Platelets: 166 10*3/uL (ref 150–400)
RBC: 4.63 MIL/uL (ref 4.22–5.81)
RDW: 12.5 % (ref 11.5–15.5)
WBC: 8.4 10*3/uL (ref 4.0–10.5)

## 2010-10-16 LAB — BASIC METABOLIC PANEL
BUN: 20 mg/dL (ref 6–23)
CO2: 26 mEq/L (ref 19–32)
Calcium: 9.2 mg/dL (ref 8.4–10.5)
Chloride: 106 mEq/L (ref 96–112)
Creatinine, Ser: 1.14 mg/dL (ref 0.4–1.5)
GFR calc Af Amer: >60 mL/min (ref >60)
GFR calc non Af Amer: >60 mL/min (ref >60)
Glucose, Bld: 95 mg/dL (ref 70–99)
Potassium: 4.3 mEq/L (ref 3.5–5.1)
Sodium: 139 mEq/L (ref 135–145)

## 2010-10-16 LAB — SURGICAL PCR SCREEN
MRSA, PCR: NEGATIVE
Staphylococcus aureus: POSITIVE — AB

## 2010-12-05 NOTE — Assessment & Plan Note (Signed)
Chad Norris                            CARDIOLOGY OFFICE NOTE   Chad Norris, Chad Norris                         MRN:          [id number]  DATE:07/13/2008                            DOB:          1932-04-01    Chad Norris is here to follow up his cardiac evaluation that I started  on June 25, 2008.  When I saw him, we decided to proceed with an echo  and an exercise test.  He had an EKG revealing premature beats.  He has  a strong family history of coronary artery disease.   Chad Norris walked on the treadmill for 8 minutes and 30 seconds.  He  stopped for fatigue.  There was no chest pain.  It is of note that he  developed a rate related left bundle-branch block.  He had PVCs in  recovery.  His nuclear images were normal.  There was no evidence of  scar or ischemia.  His 2-D echo showed that there was some distal septal  hypokinesis.  This could be related to his intermittent bundle.  Ejection fraction was 55%.  There was mild-to-moderate left ventricular  hypertrophy.  There was mild mitral regurgitation.   Chad Norris is seen back today and is feeling well.  He has not had any  significant symptoms.  His wife is present.   PAST MEDICAL HISTORY:   ALLERGIES:  No known drug allergies.   MEDICATIONS:  See the flow sheet.   OTHER MEDICAL PROBLEMS:  See the list on my note of June 25, 2008.   REVIEW OF SYSTEMS:  He is not having any GI or GU symptoms.  He has no  fevers or chills.  There is no headache or skin rashes.  His review of  systems is negative.   PHYSICAL EXAMINATION:  VITAL SIGNS:  Blood pressure 110/68 with a pulse  of 75.  GENERAL:  The patient is oriented to person, time, and place.  Affect is  normal.  HEENT:  No xanthelasma.  He has normal extraocular motion.  NECK:  There are no carotid bruits.  There is no jugular venous  distention.  LUNGS:  Clear.  Respiratory effort is not labored.  CARDIAC:  An S1 with an S2.  There are  no clicks or significant murmurs.  ABDOMEN:  Soft.  He has no peripheral edema.   Labs are discussed in the HPI paragraph above.   PROBLEMS:  #1.  Gout.  #2.  History of elevated homocysteine level.  #3.  Colon polyps.  #4.  Diverticulosis.  #5.  Hyperlipidemia, treated.  #6.  History of nephrolithiasis.  #7.  Status post left surgery shoulder.  #8.  Radical retropubic prostatectomy in 2003.  #9.  Status post cataract surgery.  #10.  Family history of coronary artery disease.  #11.  Premature ventricular contractions noted on EKG.  #12.  No evidence of ischemia by Myoview in December 2009.  #13.  Rate-related left bundle-branch block seen when walking on the  treadmill in December 2009.  #14.  Ejection fraction 55% by echo in 2009 with  slight distal septal  hypokinesis.  #15.  Mild aortic valve calcification.  #16.  Mild mitral regurgitation.   Chad Norris is stable.  I have recommended no further workup.  He will  follow with Dr. Alwyn Norris.     Luis Abed, MD, [facility]  Electronically Signed    JDK/MedQ  DD: 07/13/2008  DT: 07/13/2008  Job #: [id number]   cc:   Titus Dubin. Alwyn Ren, MD,FACP,FCCP

## 2010-12-05 NOTE — Assessment & Plan Note (Signed)
[facility]                            CARDIOLOGY OFFICE NOTE   [name], [name]                         MRN:          [id number]  DATE:06/25/2008                            DOB:          01/03/1932    HISTORY OF PRESENT ILLNESS:  [name] is seen for cardiology  consultation.  He is followed by Dr. Alwyn Ren.  The patient has risk  factors for coronary artery disease in that he has a brother who had  coronary artery disease at a young age.  The brother was a smoker.  The  patient does not smoke.  While seeing Dr. Alwyn Ren recently, [name]  was really feeling well.  It was noted on his EKG that he had scattered  PVCs.  The patient does not feel palpitation.  He has not had syncope or  presyncope.  He is active.  He is not having any chest pain.  He has no  exertional shortness of breath.  The patient had seen Dr. Loraine Leriche Pulsipher  in 2002.  At that time, he had some exertional shortness of breath.  There was no proven cardiac pathology.  He has normal LV function by  echo with no evidence of ischemia.   PAST MEDICAL HISTORY:   ALLERGIES:  No known drug allergies.   MEDICATIONS:  Vitamins, Pravachol, and vitamin D.   OTHER MEDICAL PROBLEMS:  See the list below.   REVIEW OF SYSTEMS:  He is not having any headaches or eye problems.  He  has no fevers or chills.  He has no significant chest discomfort.  He  has no GI or GU symptoms.  Last night, when standing up after sitting  for while he had some leg cramps, which have resolved.  Otherwise,  review of systems is negative.   PHYSICAL EXAMINATION:  VITAL SIGNS:  Blood pressure is 116/67 with a  pulse of 63.  GENERAL:  The patient is oriented to person, time, and place.  Affect is  normal.  HEENT:  No xanthelasma.  He has normal extraocular motion.  There are no  carotid bruits.  There is no jugular venous distention.  LUNGS:  Clear.  Respiratory effort is not labored.  CARDIAC:  S1 and S2.   There are no clicks or significant murmurs.  ABDOMEN:  Soft.  EXTREMITIES:  He has no peripheral edema.   LABS:  EKG is reviewed from Dr. Frederik Pear office.  There is no diagnostic  abnormality.  The patient does have scattered PVCs.   SOCIAL HISTORY:  The patient is married.  He does not smoke.   FAMILY HISTORY:  The patient does have a family history of coronary  artery disease in that he had a brother with young age having an MI.   PROBLEMS:  1. Gout.  2. History of elevated homocysteine level.  I do not have the exact      values.  3. History of colon polyps followed by Dr. Marina Goodell.  4. History of diverticulosis.  5. Hyperlipidemia, treated.  6. History of nephrolithiasis.  7. Status post left shoulder surgery.  8. Radical retropubic prostatectomy at [facility] in 2003.  9. Status post cataract extraction.  He has some slight  blurriness in      one eye and he thinks that may be related to follow up of his      surgery.  10.Family history of coronary artery disease with his brother.  11.Current findings with EKG revealing premature ventricular      contractions.   At this point, the patient appears to be stable.  He does not have any  symptoms.  He does have family history of coronary artery disease and he  has hyperlipidemia.  I explained to him and his wife that we need to  assess his LV function this time and this will be done with a 2-D echo.  He will also have a stress Myoview scan.  He will walk on the treadmill.  We will assess what happens to his PVCs with walking.  We will also  assess him for ischemia.   After the studies.  I will see him back for followup.   I have recommended that he start 81 mg of aspirin daily.  He can go  about his usual activities.     Luis Abed, MD, [facility]  Electronically Signed    JDK/MedQ  DD: 06/25/2008  DT: 06/26/2008  Job #: [id number]   cc:   Titus Dubin. Alwyn Ren, MD,FACP,FCCP

## 2010-12-08 NOTE — Op Note (Signed)
[place]  Patient:    Chad Norris, Chad Norris                     MRN: [id number] Proc. Date: 10/10/99 Adm. Date:  [id number] Attending:  Marcene Corning                           Operative Report  PREOPERATIVE DIAGNOSIS: 1. Left shoulder rotator cuff tear. 2. Left shoulder impingement. 3. Left shoulder labral tear.  POSTOPERATIVE DIAGNOSIS: 1. Left shoulder rotator cuff tear. 2. Left shoulder impingement. 3. Left shoulder labral tear.  OPERATION PERFORMED:  Left shoulder debridement, arthroscopic acromioplasty.  ANESTHESIA:  General and block.  ATTENDING SURGEON:  Lubertha Basque. Jerl Santos, M.D.  ASSISTANT:  Lindwood Qua, P.A.  INDICATIONS FOR PROCEDURE:  The patient is a 77 year old man with a many month history of left shoulder pain.  This persisted despite anti-inflammatories and injection and activity restriction.  He has undergone an MRI which showed a partial rotator cuff tear with some biceps tear and impingement as well as a labral tear. Planned procedure at this point is for arthroscopic intervention.  The procedure was discussed with the patient and informed operative consent was obtained after discussion of possible complications of reaction to anesthesia and infection.  DESCRIPTION OF PROCEDURE:  The patient was taken to an operating suite where general anesthetic was applied without difficulty.  This was done after a regional block was applied in the preanesthesia area.  He was then positioned in beach chair position and prepped and draped in normal sterile fashion.  After the administration of preop IV antibiotics, an arthroscopy of the left shoulder was  performed through a total of two portals.  Glenohumeral joint exhibited no degenerative change.  The anterior labral structure was well attached.  The biceps tendon was completely absent in the joint.  The rotator cuff was inspected on its undersurface.  I noted a small  partial thickness tear which was debrided.  The subacromial space was then entered.  The cuff was thoroughly inspected and no full thickness tear was seen.  He did have some partial tearing from above but this id not constitute more than about 30 or 40% of the thickness of the tendon and was not felt worthwhile to repair.  He had a large subacromial spur which was addressed  with a decompression back to a flat surface.  This was done initially with the ur in the lateral position and it was then transferred to the posterior position. A good centimeter was removed from the anteroinferior aspect of the acromion. The shoulder was then thoroughly irrigated followed by placement of Marcaine with epinephrine and morphine.  The portal was reapproximated loosely with simple sutures of nylon followed by Adaptic and a dry gauze dressing with tape. Estimated blood loss and intraoperative fluids can be obtained from Anesthesia records.  DISPOSITION:  The patient was extubated in the operating room and taken to the recovery room in stable condition.  Plans were for him to go home the same day nd to follow up in the office in less than a week.  I will contact him by phone tonight. DD:  10/10/99 TD:  10/10/99 Job: [id number]

## 2010-12-08 NOTE — Assessment & Plan Note (Signed)
[facility]ALTHC[facility]                         GASTROENTEROLOGY OFFICE NOTE   ABIEL, ANTRIM                         MRN:          [id number]  DATE:11/26/2006                            DOB:          1932-04-19    REFERRING PHYSICIAN:  Titus Dubin. Hopper, MD,FACP,FCCP   REASON FOR CONSULTATION:  Constipation, rectal pain, and rectal  bleeding.   HISTORY:  This is a pleasant 77 year old white male with a history of  prostate cancer and colon polyps who is referred through the courtesy of  Dr. Alwyn Ren regarding constipation, rectal pain, and rectal bleeding.  The patient reports intermittent problems with constipation over the  past several months.  He attributes this to diet with items such as  cheese and peanuts.  Recent problems with constipation were significant.  He had difficult and uncomfortable bowel movements as well as rectal  pain.  He also noticed some type of polypoid lesion in the anus.  He was  recently seen by Dr. Alwyn Ren.  No adequate rectal exam due to patient  discomfort.  Over the past few weeks he has been on Metamucil.  This has  helped his constipation.  His rectal discomfort has also improved  significantly.  He has not seen blood in the past week.  The patient  denies nausea or vomiting, or weight loss.  His last colonoscopy was  performed in July of 2003.  Several diminutive polyps were removed.  Only 1 polyp was reported on as a lymphoid nodule.  The second polyp's  pathology is uncertain.  Followup in 5 years recommended.  He also had  pan-diverticulosis.   PAST MEDICAL HISTORY:  1. Prostate cancer status post radical prostatectomy at [facility]      October 2003.  2. Hyperlipidemia.  3. Rotator cuff repair.   ALLERGIES:  No known drug allergies.   CURRENT MEDICATIONS:  1. Multivitamin.  2. Pravastatin 40 mg at night.  3. Also has been using Proctofoam HC p.r.n.   FAMILY HISTORY:  Brother with colon polyps, as well as  brother with  diabetes and alcoholism.  Sister with breast cancer, brother with heart  disease.   SOCIAL HISTORY:  The patient is married with 2 children, lives with his  wife.  He is a retired Tour manager.  He is now retired  and enjoys Naval architect.  Drinks an occasional beer.   REVIEW OF SYSTEMS:  Per diagnostic evaluation form.   PHYSICAL EXAM:  Well-appearing male, in no acute distress.  Blood pressure 126/64, heart rate 74, weight 203.8 pounds.  He is 6 feet  in height.  HEENT:  Sclerae anicteric, conjunctivae pink, oral mucosa intact.  No  adenopathy.  LUNGS:  Clear.  HEART:  Regular.  ABDOMEN:  Soft without tenderness, mass, or hernia.  Good bowel sounds  heard.  RECTAL:  Posterior fissure as well as benign anal tag.  No internal mass  or tenderness.  Stools Hemoccult negative.  EXTREMITIES:  Without edema.   IMPRESSION:  1. Constipation, likely functional.  Improved with Metamucil.  2. Rectal pain and rectal bleeding  due to anal fissure.  3. Benign rectal tag.  4. History of colon polyps, due for surveillance colonoscopy.  5. Diverticulosis.  6. History of prostate cancer.   RECOMMENDATIONS:  1. Continue Metamucil.  2. Schedule colonoscopy.  The nature of the procedure as well as the      risks, benefits, and alternatives have been reviewed.  He      understood and agreed to proceed.  3. Ongoing general medical care with Dr. Alwyn Ren.     Wilhemina Bonito. Marina Goodell, MD  Electronically Signed    JNP/MedQ  DD: 11/26/2006  DT: 11/26/2006  Job #: [id number]   cc:   Titus Dubin. Alwyn Ren, MD,FACP,FCCP

## 2010-12-08 NOTE — Letter (Signed)
October 31, 2006    Wilhemina Bonito. Marina Goodell, MD  [address]  [place], [place] [address]   RE:  RAYDEN, DOCK  MRN:  [id number]  /  DOB:  02/24/1932   Dear Jonny Ruiz:   I saw [place] on April 10, complaining of constipation and rectal bleeding  present for 5 weeks.  You performed a colonoscopy in 2003 which revealed  a polyp.  He questions whether he may have had recurrence of the of the  growthover the last 6 weeks. Following a  bowel movement he has had  residual rectal soreness.  He is using Preparation H with only partial  response.  I could not complete a rectal exam due to tenderness.  The  external tissues looked normal.  I could palpate a small hemorrhoids;  Hemoccult was trace positive.   Would you please have your office schedule a consultation for him.   He also had arthralgias of his hands and questions whether it might be  related to his pravastatin.  He has been taking the pravastatin somewhat  irregularly.  I will check a CPK, sed rate, uric acid on him today.  If  these are negative or normal, then I will ask him to stay on the  pravastatin for 8 to 10 weeks to assess his response to the medication.    Sincerely,      Titus Dubin. Alwyn Ren, MD,FACP,FCCP  Electronically Signed    WFH/MedQ  DD: 10/31/2006  DT: 10/31/2006  Job #: [id number]

## 2011-04-17 ENCOUNTER — Other Ambulatory Visit: Payer: Self-pay | Admitting: Internal Medicine

## 2011-04-18 MED ORDER — ZOSTER VACCINE LIVE 19400 UNT/0.65ML ~~LOC~~ SOLR: 0.6500 mL | Freq: Once | SUBCUTANEOUS | Status: DC

## 2011-04-18 NOTE — Telephone Encounter (Signed)
Rx sent 

## 2011-04-19 ENCOUNTER — Ambulatory Visit: Payer: Self-pay

## 2011-08-29 ENCOUNTER — Telehealth: Payer: Self-pay | Admitting: Internal Medicine

## 2011-08-30 NOTE — Telephone Encounter (Signed)
Spoke with pt and he states that he is having some trouble with hemorrhoids and some rectal bleeding. Pt is requesting to be seen by Dr. Marina Goodell. Pt is using Tucks pads and suppositories. Suggested pt soak in a warm tub also. Pt schedule to see Dr. Marina Goodell 09/07/11@1 :30pm. Pt aware of appt date and time.

## 2011-09-07 ENCOUNTER — Ambulatory Visit (INDEPENDENT_AMBULATORY_CARE_PROVIDER_SITE_OTHER): Payer: Medicare Other | Admitting: Internal Medicine

## 2011-09-07 ENCOUNTER — Encounter: Payer: Self-pay | Admitting: Internal Medicine

## 2011-09-07 VITALS — BP 120/60 | HR 60 | Ht 72.0 in | Wt 209.0 lb

## 2011-09-07 DIAGNOSIS — B369 Superficial mycosis, unspecified: Secondary | ICD-10-CM

## 2011-09-07 DIAGNOSIS — B49 Unspecified mycosis: Secondary | ICD-10-CM

## 2011-09-07 DIAGNOSIS — Z86010 Personal history of colonic polyps: Secondary | ICD-10-CM

## 2011-09-07 DIAGNOSIS — K625 Hemorrhage of anus and rectum: Secondary | ICD-10-CM

## 2011-09-07 MED ORDER — PEG-KCL-NACL-NASULF-NA ASC-C 100 G PO SOLR: 1.0000 | Freq: Once | ORAL | Status: DC

## 2011-09-07 NOTE — Patient Instructions (Signed)
You have been scheduled for a colonoscopy with propofol. Please follow written instructions given to you at your visit today.  Please pick up your prep kit at the pharmacy within the next 1-3 days.  You may obtain the generic form of Lamisil cream from the drugstore and apply it to the affected area 2 times daily for 3-4 weeks.  Dr. Marina Goodell also suggests you use metamucil, 2 tbs in 12-14 ounces of water or juice, daily

## 2011-09-07 NOTE — Progress Notes (Signed)
HISTORY OF PRESENT ILLNESS:  Chad Norris is a 78 y.o. male with the below listened medical history who presents today regarding rectal bleeding. He was last seen in this office October 2009 regarding rectal bleeding. This was determined to be secondary to a rectal fissure. He was treated with suppositories and fiber. This improved. He also has a history of adenomatous colon polyps on previous colonoscopy in 2003 and again in 2008. He is due for routine surveillance this year. His current history is that of intermittent rectal bleeding over the past 6-12 months. More prominent over the past 3-4 months. Some rectal discomfort. Sensation of protruding lesion. More constipation. Some perianal itching and burning. GI review of systems otherwise negative.  REVIEW OF SYSTEMS:  All non-GI ROS negative except for arthritis, fatigue, hearing problems, itching, muscle cramps, increased urination and urinary leakage at times  Past Medical History  Diagnosis Date  . Prostate cancer   . Gout   . Colon polyps   . Diverticulosis   . Hyperlipemia   . Nephrolithiasis   . Hemorrhoids     Past Surgical History  Procedure Date  . Shoulder surgery     left  . Retropubic prostatectomy   . Colonoscopy w/ polypectomy   . Cataract extraction w/ intraocular lens  implant, bilateral     bi-lat    Social History Chad Norris  reports that he has never smoked. He has never used smokeless tobacco. He reports that he does not drink alcohol or use illicit drugs.  family history includes Alcohol abuse in his brother; Breast cancer in his sister; Cancer in his brother; Colon cancer in his father; Diabetes in his brother; Heart attack in his brother and sister; and Kidney cancer in his father.  No Known Allergies     PHYSICAL EXAMINATION: Vital signs: BP 120/60  Pulse 60  Ht 6' (1.829 m)  Wt 209 lb (94.802 kg)  BMI 28.35 kg/m2  Constitutional: generally well-appearing, no acute distress Psychiatric: alert  and oriented x3, cooperative Eyes: extraocular movements intact, anicteric, conjunctiva pink Mouth: oral pharynx moist, no lesions Neck: supple no lymphadenopathy Cardiovascular: heart regular rate and rhythm, no murmur Lungs: clear to auscultation bilaterally Abdomen: soft, nontender, nondistended, no obvious ascites, no peritoneal signs, normal bowel sounds, no organomegaly Rectal:cutaneous fungal infection around the perianal area. No external hemorrhoids. Small tag. Extremities: no lower extremity edema bilaterally Skin: no lesions on visible extremities Neuro: No focal deficits.   ASSESSMENT:  #1. Rectal bleeding. Likely due to note internal hemorrhoids or fissure. (Has had previously) #2. Cutaneous fungal infection around the perianal area #3. History of adenomatous colon polyps. Due for surveillance   PLAN:  #1. Metamucil 2 tablespoons daily in 12-14 ounces of water or juice #2. Lamisil cream to be applied twice a day to the affected perianal skin for 3-4 weeks #3. Schedule surveillance colonoscopy. Movi prep prescribed. Patient instructed on its use.The nature of the procedure, as well as the risks, benefits, and alternatives were carefully and thoroughly reviewed with the patient. Ample time for discussion and questions allowed. The patient understood, was satisfied, and agreed to proceed.

## 2011-09-17 ENCOUNTER — Encounter: Payer: Medicare Other | Admitting: Internal Medicine

## 2011-10-02 ENCOUNTER — Ambulatory Visit (AMBULATORY_SURGERY_CENTER): Payer: Medicare Other | Admitting: Internal Medicine

## 2011-10-02 ENCOUNTER — Encounter: Payer: Self-pay | Admitting: Internal Medicine

## 2011-10-02 VITALS — BP 138/83 | HR 60 | Temp 98.1°F | Resp 20 | Ht 72.0 in | Wt 209.0 lb

## 2011-10-02 DIAGNOSIS — Z860100 Personal history of colon polyps, unspecified: Secondary | ICD-10-CM

## 2011-10-02 DIAGNOSIS — K5730 Diverticulosis of large intestine without perforation or abscess without bleeding: Secondary | ICD-10-CM

## 2011-10-02 DIAGNOSIS — K625 Hemorrhage of anus and rectum: Secondary | ICD-10-CM

## 2011-10-02 DIAGNOSIS — Z1211 Encounter for screening for malignant neoplasm of colon: Secondary | ICD-10-CM

## 2011-10-02 MED ORDER — SODIUM CHLORIDE 0.9 % IV SOLN: 500.0000 mL | INTRAVENOUS | Status: DC

## 2011-10-02 NOTE — Patient Instructions (Signed)
YOU HAD AN ENDOSCOPIC PROCEDURE TODAY AT THE [facility]: Refer to the procedure report that was given to you for any specific questions about what was found during the examination.  If the procedure report does not answer your questions, please call your gastroenterologist to clarify.  If you requested that your care partner not be given the details of your procedure findings, then the procedure report has been included in a sealed envelope for you to review at your convenience later.  YOU SHOULD EXPECT: Some feelings of bloating in the abdomen. Passage of more gas than usual.  Walking can help get rid of the air that was put into your GI tract during the procedure and reduce the bloating. If you had a lower endoscopy (such as a colonoscopy or flexible sigmoidoscopy) you may notice spotting of blood in your stool or on the toilet paper. If you underwent a bowel prep for your procedure, then you may not have a normal bowel movement for a few days.  DIET: Your first meal following the procedure should be a light meal and then it is ok to progress to your normal diet.  A half-sandwich or bowl of soup is an example of a good first meal.  Heavy or fried foods are harder to digest and may make you feel nauseous or bloated.  Likewise meals heavy in dairy and vegetables can cause extra gas to form and this can also increase the bloating.  Drink plenty of fluids but you should avoid alcoholic beverages for 24 hours.  ACTIVITY: Your care partner should take you home directly after the procedure.  You should plan to take it easy, moving slowly for the rest of the day.  You can resume normal activity the day after the procedure however you should NOT DRIVE or use heavy machinery for 24 hours (because of the sedation medicines used during the test).    SYMPTOMS TO REPORT IMMEDIATELY: A gastroenterologist can be reached at any hour.  During normal business hours, 8:30 AM to 5:00 PM Monday through Friday,  call [phone].  After hours and on weekends, please call the GI answering service at [phone] who will take a message and have the physician on call contact you.   Following lower endoscopy (colonoscopy or flexible sigmoidoscopy):  Excessive amounts of blood in the stool  Significant tenderness or worsening of abdominal pains  Swelling of the abdomen that is new, acute  Fever of 100F or higher    FOLLOW UP: If any biopsies were taken you will be contacted by phone or by letter within the next 1-3 weeks.  Call your gastroenterologist if you have not heard about the biopsies in 3 weeks.  Our staff will call the home number listed on your records the next business day following your procedure to check on you and address any questions or concerns that you may have at that time regarding the information given to you following your procedure. This is a courtesy call and so if there is no answer at the home number and we have not heard from you through the emergency physician on call, we will assume that you have returned to your regular daily activities without incident.  SIGNATURES/CONFIDENTIALITY: You and/or your care partner have signed paperwork which will be entered into your electronic medical record.  These signatures attest to the fact that that the information above on your After Visit Summary has been reviewed and is understood.  Full responsibility of the confidentiality   of this discharge information lies with you and/or your care-partner.     

## 2011-10-02 NOTE — Progress Notes (Signed)
Patient did not experience any of the following events: a burn prior to discharge; a fall within the facility; wrong site/side/patient/procedure/implant event; or a hospital transfer or hospital admission upon discharge from the facility. (G8907) Patient with preoperative order for IV antibiotic SSI prophylaxis, antibiotic initiated on time. (G8916)  

## 2011-10-02 NOTE — Op Note (Signed)
[facility] [address] [place], [place]  [address]  COLONOSCOPY PROCEDURE REPORT  PATIENT:  Hillard, Goodwine  MR#:  [id number] BIRTHDATE:  1931-09-03, 79 yrs. old  GENDER:  male ENDOSCOPIST:  Wilhemina Bonito. Eda Keys, MD REF. BY:  Office PROCEDURE DATE:  10/02/2011 PROCEDURE:  Surveillance Colonoscopy ASA CLASS:  Class II INDICATIONS:  history of pre-cancerous (adenomatous) colon polyps, surveillance and high-risk screening ; prior exams 2003, 2008 ([facility])  MEDICATIONS:   MAC sedation, administered by CRNA, propofol (Diprivan) 130 mg IV  DESCRIPTION OF PROCEDURE:   After the risks benefits and alternatives of the procedure were thoroughly explained, informed consent was obtained.  Digital rectal exam was performed and revealed no abnormalities.   The LB CF-[id number] endoscope was introduced through the anus and advanced to the cecum, which was identified by both the appendix and ileocecal valve, without limitations.  The quality of the prep was adequate, using MoviPrep.  The instrument was then slowly withdrawn as the colon was fully examined. <<PROCEDUREIMAGES>>  FINDINGS:  Moderate diverticulosis was found in the sigmoid colon. Otherwise normal colonoscopy without polyps, masses, vascular ectasias, or inflammatory changes.   Retroflexed views in the rectum revealed internal hemorrhoids.    The time to cecum =  2:16 minutes. The scope was then withdrawn in 9:40  minutes from the cecum and the procedure completed.  COMPLICATIONS:  None  ENDOSCOPIC IMPRESSION: 1) Moderate diverticulosis in the sigmoid colon 2) Otherwise normal colonoscopy 3) Internal hemorrhoids  RECOMMENDATIONS: 1) Return to the care of your primary provider. GI follow up as needed  ______________________________ Wilhemina Bonito. Eda Keys, MD  CC:  Pecola Lawless, MD;  The Patient  n. eSIGNED:   Wilhemina Bonito. Eda Keys at 10/02/2011 03:30 PM  Stark Bray, [id number]

## 2011-10-03 ENCOUNTER — Telehealth: Payer: Self-pay | Admitting: *Deleted

## 2011-10-03 NOTE — Telephone Encounter (Signed)
  Follow up Call-  Call back number 10/02/2011  Post procedure Call Back phone  # [phone]  Permission to leave phone message Yes     Patient questions:  Do you have a fever, pain , or abdominal swelling? no Pain Score  0 *  Have you tolerated food without any problems? yes  Have you been able to return to your normal activities? yes  Do you have any questions about your discharge instructions: Diet   no Medications  no Follow up visit  no  Do you have questions or concerns about your Care? no  Actions: * If pain score is 4 or above: No action needed, pain <4.

## 2011-12-07 ENCOUNTER — Encounter: Payer: Self-pay | Admitting: Family Medicine

## 2011-12-07 ENCOUNTER — Ambulatory Visit (INDEPENDENT_AMBULATORY_CARE_PROVIDER_SITE_OTHER): Payer: Medicare Other | Admitting: Family Medicine

## 2011-12-07 VITALS — BP 130/75 | HR 60 | Temp 98.1°F | Ht 72.0 in | Wt 203.2 lb

## 2011-12-07 DIAGNOSIS — R059 Cough, unspecified: Secondary | ICD-10-CM

## 2011-12-07 DIAGNOSIS — R058 Other specified cough: Secondary | ICD-10-CM | POA: Insufficient documentation

## 2011-12-07 DIAGNOSIS — R05 Cough: Secondary | ICD-10-CM

## 2011-12-07 DIAGNOSIS — J358 Other chronic diseases of tonsils and adenoids: Secondary | ICD-10-CM

## 2011-12-07 NOTE — Patient Instructions (Signed)
We'll call you with your ENT appt This is most likely a tonsillar stone (tonsillith) and these are usually nothing to worry about Call with any questions or concerns Happy Early [place] Ouch!

## 2011-12-07 NOTE — Progress Notes (Signed)
  Subjective:    Patient ID: Chad Norris, male    DOB: 03/04/1932, 78 y.o.   MRN: [id number]  HPI Productive cough- sxs started 6 months ago, will cough up 'pellet like' things 'at least a couple times a week'.  Generally not coughing otherwise.  Doesn't have to cough hard for them to be expelled.  Stones have 'terrible odor'.  No fevers.  No SOB.  No tender salivary glands or LNs.  No hx of similar.  Uncertain whether tonsils are present.   Review of Systems For ROS see HPI     Objective:   Physical Exam  Vitals reviewed. Constitutional: He appears well-developed and well-nourished. No distress.  HENT:  Head: Normocephalic and atraumatic.  Nose: Nose normal.  Mouth/Throat: Oropharynx is clear and moist. No oropharyngeal exudate.       TMs normal bilaterally No TTP over sinuses  Neck: Normal range of motion. Neck supple.  Pulmonary/Chest: Effort normal and breath sounds normal. No respiratory distress. He has no wheezes. He has no rales.  Lymphadenopathy:    He has no cervical adenopathy.          Assessment & Plan:

## 2011-12-08 NOTE — Assessment & Plan Note (Signed)
New.  See above. 

## 2011-12-08 NOTE — Assessment & Plan Note (Signed)
New.  Suspect that this is actually not a lung issue but rather an ENT problem- tonsillith.  Pt brought sample- foul smelling, mass the size of a small marble.  Lungs CTA.  No abx required.  Reviewed dx w/ pt.  Will refer to ENT.  Pt expressed understanding and is in agreement w/ plan.

## 2011-12-20 ENCOUNTER — Encounter: Payer: Self-pay | Admitting: Family Medicine

## 2011-12-21 ENCOUNTER — Other Ambulatory Visit: Payer: Self-pay | Admitting: Family Medicine

## 2011-12-21 DIAGNOSIS — M549 Dorsalgia, unspecified: Secondary | ICD-10-CM

## 2011-12-24 ENCOUNTER — Ambulatory Visit
Admission: RE | Admit: 2011-12-24 | Discharge: 2011-12-24 | Disposition: A | Discharge status: Home / Self Care | Payer: Medicare Other | Source: Ambulatory Visit | Attending: Family Medicine | Admitting: Family Medicine

## 2011-12-24 VITALS — BP 154/77 | HR 55

## 2011-12-24 DIAGNOSIS — M549 Dorsalgia, unspecified: Secondary | ICD-10-CM

## 2011-12-24 MED ORDER — IOHEXOL 180 MG/ML  SOLN
1.0000 mL | Freq: Once | INTRAMUSCULAR | Status: AC | PRN
  Administered 2011-12-24: 1 mL via EPIDURAL

## 2011-12-24 MED ORDER — METHYLPREDNISOLONE ACETATE 40 MG/ML INJ SUSP (RADIOLOG
60.0000 mg | Freq: Once | INTRAMUSCULAR | Status: AC
  Administered 2011-12-24: 60 mg via EPIDURAL

## 2011-12-24 NOTE — Discharge Instructions (Signed)

## 2012-02-14 ENCOUNTER — Other Ambulatory Visit: Payer: Self-pay | Admitting: Family Medicine

## 2012-02-14 DIAGNOSIS — M5410 Radiculopathy, site unspecified: Secondary | ICD-10-CM

## 2012-02-18 ENCOUNTER — Ambulatory Visit
Admission: RE | Admit: 2012-02-18 | Discharge: 2012-02-18 | Disposition: A | Discharge status: Home / Self Care | Payer: Medicare Other | Source: Ambulatory Visit | Attending: Family Medicine | Admitting: Family Medicine

## 2012-02-18 VITALS — BP 148/82 | HR 56

## 2012-02-18 DIAGNOSIS — M5410 Radiculopathy, site unspecified: Secondary | ICD-10-CM

## 2012-02-18 MED ORDER — IOHEXOL 180 MG/ML  SOLN
1.0000 mL | Freq: Once | INTRAMUSCULAR | Status: AC | PRN
  Administered 2012-02-18: 1 mL via EPIDURAL

## 2012-02-18 MED ORDER — METHYLPREDNISOLONE ACETATE 40 MG/ML INJ SUSP (RADIOLOG
120.0000 mg | Freq: Once | INTRAMUSCULAR | Status: AC
  Administered 2012-02-18: 120 mg via EPIDURAL

## 2012-02-19 ENCOUNTER — Other Ambulatory Visit: Payer: Medicare Other

## 2012-05-07 ENCOUNTER — Ambulatory Visit: Payer: Medicare Other

## 2012-05-08 ENCOUNTER — Ambulatory Visit (INDEPENDENT_AMBULATORY_CARE_PROVIDER_SITE_OTHER): Payer: Medicare Other

## 2012-05-08 DIAGNOSIS — Z23 Encounter for immunization: Secondary | ICD-10-CM

## 2012-09-25 ENCOUNTER — Encounter: Payer: Self-pay | Admitting: Internal Medicine

## 2013-02-25 ENCOUNTER — Other Ambulatory Visit: Payer: Self-pay

## 2013-05-12 ENCOUNTER — Ambulatory Visit (INDEPENDENT_AMBULATORY_CARE_PROVIDER_SITE_OTHER): Payer: Medicare Other | Admitting: General Practice

## 2013-05-12 DIAGNOSIS — Z23 Encounter for immunization: Secondary | ICD-10-CM

## 2013-07-24 ENCOUNTER — Encounter: Payer: Self-pay | Admitting: Internal Medicine

## 2013-07-24 ENCOUNTER — Ambulatory Visit (INDEPENDENT_AMBULATORY_CARE_PROVIDER_SITE_OTHER): Payer: Medicare Other | Admitting: Internal Medicine

## 2013-07-24 ENCOUNTER — Telehealth: Payer: Self-pay | Admitting: Internal Medicine

## 2013-07-24 VITALS — BP 123/72 | HR 74 | Temp 99.0°F | Resp 16 | Wt 202.0 lb

## 2013-07-24 DIAGNOSIS — R5383 Other fatigue: Secondary | ICD-10-CM

## 2013-07-24 DIAGNOSIS — R509 Fever, unspecified: Secondary | ICD-10-CM

## 2013-07-24 DIAGNOSIS — J22 Unspecified acute lower respiratory infection: Secondary | ICD-10-CM

## 2013-07-24 DIAGNOSIS — J988 Other specified respiratory disorders: Secondary | ICD-10-CM

## 2013-07-24 DIAGNOSIS — R5381 Other malaise: Secondary | ICD-10-CM

## 2013-07-24 LAB — CBC WITH DIFFERENTIAL/PLATELET
Basophils Absolute: 0 10*3/uL (ref 0.0–0.1)
Basophils Relative: 0.3 % (ref 0.0–3.0)
Eosinophils Absolute: 0 10*3/uL (ref 0.0–0.7)
Eosinophils Relative: 0.3 % (ref 0.0–5.0)
HCT: 43.4 % (ref 39.0–52.0)
Hemoglobin: 15 g/dL (ref 13.0–17.0)
Lymphocytes Relative: 16.1 % (ref 12.0–46.0)
Lymphs Abs: 1.9 10*3/uL (ref 0.7–4.0)
MCHC: 34.7 g/dL (ref 30.0–36.0)
MCV: 90 fl (ref 78.0–100.0)
Monocytes Absolute: 1.3 10*3/uL — ABNORMAL HIGH (ref 0.1–1.0)
Monocytes Relative: 10.6 % (ref 3.0–12.0)
Neutro Abs: 8.8 10*3/uL — ABNORMAL HIGH (ref 1.4–7.7)
Neutrophils Relative %: 72.7 % (ref 43.0–77.0)
Platelets: 223 10*3/uL (ref 150.0–400.0)
RBC: 4.82 Mil/uL (ref 4.22–5.81)
RDW: 12.3 % (ref 11.5–14.6)
WBC: 12.1 10*3/uL — ABNORMAL HIGH (ref 4.5–10.5)

## 2013-07-24 MED ORDER — OSELTAMIVIR PHOSPHATE 75 MG PO CAPS: 75.0000 mg | ORAL_CAPSULE | Freq: Two times a day (BID) | ORAL | Status: DC

## 2013-07-24 NOTE — Telephone Encounter (Signed)
12:15 or 12:45 today

## 2013-07-24 NOTE — Telephone Encounter (Signed)
Patient's spouse is calling on behalf of the patient who has a bad cough, chills, and is running a slight fever. She states that she took him over to [facility] and he was given azithromycin which is not helping. Patient's spouse Julie Paolini, states that she thinks he has the same bug she had and Dr. Linna Darner rx'd an antibiotic for her on 12/30 that seems to be helping. She wants to know if Dr. Linna Darner can rx the same for her husband since there are no openings today. Please advise.

## 2013-07-24 NOTE — Progress Notes (Signed)
   Subjective:    Patient ID: Chad Norris, male    DOB: 1932-03-26, 80 y.o.   MRN: [id number]  HPI  His symptoms began 07/10/13 as weakness and diffuse arthralgias and dry cough.  He also describes chills ,head congestion & yellow sputum.  He was seen at an urgent care and given Zithromax with partial response.  He continues to have temporal headaches, "dizzy headed"  and malaise.    Review of Systems  He denies frontal headache, facial pain, or nasal purulence  He has no otic pain, or otic discharge  He is not having shortness of breath or wheezing with the cough.     Objective:   Physical Exam General appearance:adequately nourished; no acute distress or increased work of breathing is present. Appears tired. No  lymphadenopathy about the head, neck, or axilla noted.   Eyes: No conjunctival inflammation or lid edema is present. There is no scleral icterus.  Ears:  External ear exam shows no significant lesions or deformities.  Otoscopic examination reveals clear canals, tympanic membranes are intact bilaterally without bulging, retraction, inflammation or discharge.  Nose:  External nasal examination shows no deformity or inflammation. Nasal mucosa erythematous  without lesions or exudates. Rightward  septal  deviation.No obstruction to airflow.   Oral exam: Dental hygiene is good; lips and gums are healthy appearing.There is no oropharyngeal erythema or exudate noted.   Neck:  No deformities, thyromegaly, masses, or tenderness noted.   Supple with full range of motion without pain.   Heart:  Normal rate and regular rhythm. S1 and S2 normal without gallop, murmur, click, rub or other extra sounds.   Lungs:Chest clear to auscultation; no wheezes, rhonchi,rales ,or rubs present.No increased work of breathing.    Extremities:  No cyanosis, edema, or clubbing  noted    Skin: Warm & dry w/o jaundice or tenting.         Assessment & Plan:  #1 RTI with malaise, fever  ,arthralgias & cough , R/O influenza See orders

## 2013-07-24 NOTE — Patient Instructions (Addendum)
NSAIDS ( Aleve, Advil, Naproxen) or Tylenol every 4 hrs as needed for fever as discussed based on label recommendations.Stay well hydrated. Drink to thirst up to 40 ounces of fluids daily.   Nasal cleansing in the shower as discussed with lather of mild shampoo.After 10 seconds wash off lather while  exhaling through nostrils. Make sure that all residual soap is removed to prevent irritation.  Nasacort AQ OTC  1 spray in each nostril twice a day as needed. Use the "crossover" technique into opposite nostril spraying toward opposite ear @ 45 degree angle, not straight up into nostril.     Vitamin C 2000 mg daily  & Echinacea for 4-7 days. Report fever, exudate("pus") or progressive pain.

## 2013-07-24 NOTE — Telephone Encounter (Signed)
Adero, can you please schedule patient at 12:15? Thanks, Janett Billow

## 2013-07-24 NOTE — Telephone Encounter (Signed)
Called and spoke with patient's wife and stated they would be here at 12:15 to see Dr. Linna Darner. JG//CMA

## 2013-09-25 ENCOUNTER — Encounter: Payer: Medicare Other | Admitting: Internal Medicine

## 2013-11-26 ENCOUNTER — Encounter: Payer: Medicare Other | Admitting: Internal Medicine

## 2014-01-13 ENCOUNTER — Encounter: Payer: Self-pay | Admitting: Internal Medicine

## 2014-01-13 ENCOUNTER — Ambulatory Visit (INDEPENDENT_AMBULATORY_CARE_PROVIDER_SITE_OTHER): Payer: Medicare Other | Admitting: Internal Medicine

## 2014-01-13 ENCOUNTER — Other Ambulatory Visit: Payer: Self-pay | Admitting: Internal Medicine

## 2014-01-13 ENCOUNTER — Other Ambulatory Visit (INDEPENDENT_AMBULATORY_CARE_PROVIDER_SITE_OTHER): Payer: Medicare Other

## 2014-01-13 VITALS — BP 132/78 | HR 60 | Temp 98.1°F | Ht 72.0 in | Wt 208.4 lb

## 2014-01-13 DIAGNOSIS — Z8546 Personal history of malignant neoplasm of prostate: Secondary | ICD-10-CM

## 2014-01-13 DIAGNOSIS — E785 Hyperlipidemia, unspecified: Secondary | ICD-10-CM

## 2014-01-13 DIAGNOSIS — Z0000 Encounter for general adult medical examination without abnormal findings: Secondary | ICD-10-CM

## 2014-01-13 DIAGNOSIS — M109 Gout, unspecified: Secondary | ICD-10-CM

## 2014-01-13 DIAGNOSIS — M5137 Other intervertebral disc degeneration, lumbosacral region: Secondary | ICD-10-CM

## 2014-01-13 DIAGNOSIS — Z86010 Personal history of colonic polyps: Secondary | ICD-10-CM

## 2014-01-13 LAB — TSH: TSH: 1.56 u[IU]/mL (ref 0.35–4.50)

## 2014-01-13 LAB — CBC WITH DIFFERENTIAL/PLATELET
Basophils Absolute: 0.1 10*3/uL (ref 0.0–0.1)
Basophils Relative: 1 % (ref 0.0–3.0)
Eosinophils Absolute: 0.5 10*3/uL (ref 0.0–0.7)
Eosinophils Relative: 6.4 % — ABNORMAL HIGH (ref 0.0–5.0)
HCT: 44.7 % (ref 39.0–52.0)
Hemoglobin: 15.4 g/dL (ref 13.0–17.0)
Lymphocytes Relative: 35.8 % (ref 12.0–46.0)
Lymphs Abs: 2.6 10*3/uL (ref 0.7–4.0)
MCHC: 34.6 g/dL (ref 30.0–36.0)
MCV: 91.4 fl (ref 78.0–100.0)
Monocytes Absolute: 0.7 10*3/uL (ref 0.1–1.0)
Monocytes Relative: 9.4 % (ref 3.0–12.0)
Neutro Abs: 3.4 10*3/uL (ref 1.4–7.7)
Neutrophils Relative %: 47.4 % (ref 43.0–77.0)
Platelets: 206 10*3/uL (ref 150.0–400.0)
RBC: 4.89 Mil/uL (ref 4.22–5.81)
RDW: 12.4 % (ref 11.5–15.5)
WBC: 7.3 10*3/uL (ref 4.0–10.5)

## 2014-01-13 LAB — BASIC METABOLIC PANEL
BUN: 23 mg/dL (ref 6–23)
CO2: 28 mEq/L (ref 19–32)
Calcium: 9.5 mg/dL (ref 8.4–10.5)
Chloride: 103 mEq/L (ref 96–112)
Creatinine, Ser: 1.2 mg/dL (ref 0.4–1.5)
GFR: 60.74 mL/min (ref >60.00)
Glucose, Bld: 97 mg/dL (ref 70–99)
Potassium: 4.4 mEq/L (ref 3.5–5.1)
Sodium: 138 mEq/L (ref 135–145)

## 2014-01-13 LAB — PSA: PSA: 0.01 ng/mL — ABNORMAL LOW (ref 0.10–4.00)

## 2014-01-13 LAB — HEPATIC FUNCTION PANEL
ALT: 25 U/L (ref 0–53)
AST: 24 U/L (ref 0–37)
Albumin: 4.3 g/dL (ref 3.5–5.2)
Alkaline Phosphatase: 39 U/L (ref 39–117)
Bilirubin, Direct: 0.1 mg/dL (ref 0.0–0.3)
Total Bilirubin: 0.7 mg/dL (ref 0.2–1.2)
Total Protein: 6.9 g/dL (ref 6.0–8.3)

## 2014-01-13 LAB — URIC ACID: Uric Acid, Serum: 8.5 mg/dL — ABNORMAL HIGH (ref 4.0–7.8)

## 2014-01-13 NOTE — Assessment & Plan Note (Signed)
PSA

## 2014-01-13 NOTE — Progress Notes (Signed)
Subjective:    Patient ID: Chad Norris, male    DOB: 10/06/1931, 80 y.o.   MRN: [id number]  HPI UHC/ Medicare Wellness Visit: Psychosocial and medical history were reviewed as required by Medicare (history related to abuse, antisocial behavior , firearm risk). Social history: Caffeine:1 cup/day , Alcohol:no  , Tobacco use:no Exercise:see below Personal safety/fall risk:no Limitations of activities of daily living:no Seatbelt/ smoke alarm use:yes Healthcare Power of Attorney/Living Will status: UTD Ophthalmologic exam status:UTD Hearing evaluation status:2013 @ [employer] ; could not wear aids due to background interference Orientation: Oriented X 3 Memory and recall: good Math testing: good Depression/anxiety assessment: no Foreign travel history:[place] 2005 Immunization status for influenza/pneumonia/ shingles /tetanus: UTD Transfusion history:no Preventive health care maintenance status: Colonoscopy as per protocol/standard care: UTD Dental care:every 6 mos Chart reviewed and updated. Active issues reviewed and addressed as documented below.  A heart healthy diet is followed; exercise encompasses 15 min minutes 7 times per week as walking without symptoms.  Family history is + for premature coronary disease. Advanced cholesterol testing not done to date. To date no statin.  Low dose ASA taken.  Review of Systems  Specifically denied are  chest pain, palpitations, dyspnea, or claudication.  Significant abdominal symptoms, memory deficit, or myalgias not present. Gout inactive since 2010.  He continues to have lumbosacral area pain. He's been using bio freeze withsome benefit. He did have epidural steroid injections from a neurosurgeon in 2005 without benefit. He denies numbness, tingling, weakness in the extremities. He has no urinary your stool incontinence.       Objective:   Physical Exam  Gen.: Healthy and well-nourished in appearance. Alert, appropriate and cooperative  throughout exam. Appears younger than stated age  Head: Normocephalic without obvious abnormalities;  pattern alopecia  Eyes: No corneal or conjunctival inflammation noted. Pupils equal round reactive to light and accommodation. Extraocular motion intact.  Ears: External  ear exam reveals no significant lesions or deformities. Canals clear .TMs normal. Hearing is grossly decreased bilaterally. Nose: External nasal exam reveals no deformity or inflammation. Nasal mucosa are pink and moist. No lesions or exudates noted.   Mouth: Oral mucosa and oropharynx reveal no lesions or exudates. Teeth in good repair. Neck: No deformities, masses, or tenderness noted. Range of motion & Thyroid normal. Lungs: Normal respiratory effort; chest expands symmetrically. Lungs are clear to auscultation without rales, wheezes, or increased work of breathing. Heart: Sounds distant.Slow rate ;normal rhythm. Normal S1 and S2. No gallop, click, or rub. No murmur. Abdomen: Bowel sounds normal; abdomen soft and nontender. No masses, organomegaly or hernias noted. Genitalia:  Deferred ; check PSA because of LS pain                            Musculoskeletal/extremities: No deformity or scoliosis noted of  the thoracic or lumbar spine.  No clubbing, cyanosis, edema, or significant extremity  deformity noted. Range of motion normal .Tone & strength normal. Hand joints normal Fingernail  health good. Able to lie down & sit up w/o help. Negative SLR bilaterally Vascular: Carotid, radial artery, dorsalis pedis and  posterior tibial pulses are full and equal. No bruits present. Neurologic: Alert and oriented x3. Deep tendon reflexes symmetrical and normal.  Gait normal .     Skin: Intact without suspicious lesions or rashes. Lymph: No cervical, axillary lymphadenopathy present. Psych: Mood and affect are normal. Normally interactive  Assessment & Plan:  #1 Medicare Wellness Exam; criteria met ; data entered #2 See Current Assessment & Plan in Problem List under specific DiagnosisThe labs will be reviewed and risks and options assessed. Written recommendations will be provided by mail or directly through My Chart.Further evaluation or change in medical therapy will be directed by those results.

## 2014-01-13 NOTE — Patient Instructions (Signed)
Your next office appointment will be determined based upon review of your pending labs. Those instructions will be transmitted to you through My Chart . 

## 2014-01-13 NOTE — Assessment & Plan Note (Signed)
CBC

## 2014-01-13 NOTE — Progress Notes (Signed)
Pre visit review using our clinic review tool, if applicable. No additional management support is needed unless otherwise documented below in the visit note. 

## 2014-01-13 NOTE — Assessment & Plan Note (Addendum)
NMR Lipoprofile LFT

## 2014-01-13 NOTE — Assessment & Plan Note (Signed)
Uric acid

## 2014-01-15 LAB — NMR LIPOPROFILE WITH LIPIDS
Cholesterol, Total: 216 mg/dL — ABNORMAL HIGH (ref <200)
HDL Particle Number: 28.2 umol/L — ABNORMAL LOW (ref >=30.5)
HDL Size: 8.4 nm — ABNORMAL LOW (ref >=9.2)
HDL-C: 41 mg/dL (ref >=40)
LDL (calc): 126 mg/dL — ABNORMAL HIGH (ref <100)
LDL Particle Number: 2057 nmol/L — ABNORMAL HIGH (ref <1000)
LDL Size: 20.3 nm — ABNORMAL LOW (ref >20.5)
LP-IR Score: 81 — ABNORMAL HIGH (ref <=45)
Large HDL-P: <1.3 umol/L — ABNORMAL LOW (ref >=4.8)
Large VLDL-P: 5.9 nmol/L — ABNORMAL HIGH (ref <=2.7)
Small LDL Particle Number: 1207 nmol/L — ABNORMAL HIGH (ref <=527)
Triglycerides: 245 mg/dL — ABNORMAL HIGH (ref <150)
VLDL Size: 53.1 nm — ABNORMAL HIGH (ref <=46.6)

## 2014-01-15 NOTE — Assessment & Plan Note (Signed)
Uric Acid 

## 2014-03-16 ENCOUNTER — Encounter: Payer: Self-pay | Admitting: Internal Medicine

## 2014-04-21 ENCOUNTER — Ambulatory Visit (INDEPENDENT_AMBULATORY_CARE_PROVIDER_SITE_OTHER): Payer: Medicare Other

## 2014-04-21 DIAGNOSIS — Z23 Encounter for immunization: Secondary | ICD-10-CM

## 2014-07-01 ENCOUNTER — Emergency Department (HOSPITAL_BASED_OUTPATIENT_CLINIC_OR_DEPARTMENT_OTHER): Payer: Medicare Other

## 2014-07-01 ENCOUNTER — Encounter (HOSPITAL_BASED_OUTPATIENT_CLINIC_OR_DEPARTMENT_OTHER): Payer: Self-pay | Admitting: *Deleted

## 2014-07-01 ENCOUNTER — Emergency Department (HOSPITAL_BASED_OUTPATIENT_CLINIC_OR_DEPARTMENT_OTHER)
Admission: EM | Admit: 2014-07-01 | Discharge: 2014-07-01 | Disposition: A | Discharge status: Home / Self Care | Payer: Medicare Other | Attending: Emergency Medicine | Admitting: Emergency Medicine

## 2014-07-01 DIAGNOSIS — J159 Unspecified bacterial pneumonia: Secondary | ICD-10-CM | POA: Diagnosis not present

## 2014-07-01 DIAGNOSIS — Z8739 Personal history of other diseases of the musculoskeletal system and connective tissue: Secondary | ICD-10-CM | POA: Diagnosis not present

## 2014-07-01 DIAGNOSIS — R05 Cough: Secondary | ICD-10-CM

## 2014-07-01 DIAGNOSIS — Z8546 Personal history of malignant neoplasm of prostate: Secondary | ICD-10-CM | POA: Insufficient documentation

## 2014-07-01 DIAGNOSIS — Z87442 Personal history of urinary calculi: Secondary | ICD-10-CM | POA: Insufficient documentation

## 2014-07-01 DIAGNOSIS — Z7982 Long term (current) use of aspirin: Secondary | ICD-10-CM | POA: Insufficient documentation

## 2014-07-01 DIAGNOSIS — Z8719 Personal history of other diseases of the digestive system: Secondary | ICD-10-CM | POA: Diagnosis not present

## 2014-07-01 DIAGNOSIS — Z8639 Personal history of other endocrine, nutritional and metabolic disease: Secondary | ICD-10-CM | POA: Insufficient documentation

## 2014-07-01 DIAGNOSIS — Z79899 Other long term (current) drug therapy: Secondary | ICD-10-CM | POA: Insufficient documentation

## 2014-07-01 DIAGNOSIS — J189 Pneumonia, unspecified organism: Secondary | ICD-10-CM

## 2014-07-01 DIAGNOSIS — Z86010 Personal history of colonic polyps: Secondary | ICD-10-CM | POA: Insufficient documentation

## 2014-07-01 DIAGNOSIS — R059 Cough, unspecified: Secondary | ICD-10-CM

## 2014-07-01 MED ORDER — LEVOFLOXACIN 750 MG PO TABS: 750.0000 mg | ORAL_TABLET | Freq: Every day | ORAL | Status: DC

## 2014-07-01 NOTE — ED Provider Notes (Signed)
CSN: [id number]     Arrival date & time 07/01/14  1158 History   First MD Initiated Contact with Patient 07/01/14 1232     Chief Complaint  Patient presents with  . Cough     (Consider location/radiation/quality/duration/timing/severity/associated sxs/prior Treatment) Patient is a 80 y.o. male presenting with cough. The history is provided by the patient. No language interpreter was used.  Cough Cough characteristics:  Productive Sputum characteristics:  Yellow Severity:  Moderate Onset quality:  Gradual Duration:  2 days Timing:  Constant Progression:  Unchanged Chronicity:  New Smoker: no   Context: sick contacts   Context: not animal exposure, not exposure to allergens, not fumes, not occupational exposure, not upper respiratory infection, not weather changes and not with activity   Relieved by:  Nothing Worsened by:  Activity Ineffective treatments:  Rest, fluids and cough suppressants Associated symptoms: fever   Associated symptoms: no chest pain, no myalgias, no rhinorrhea, no shortness of breath, no sinus congestion and no weight loss   Fever:    Duration:  2 days   Timing:  Intermittent   Max temp PTA (F):  Subjective   Temp source:  Subjective   Progression:  Unchanged Risk factors: no chemical exposure, no recent infection and no recent travel     Past Medical History  Diagnosis Date  . Prostate cancer   . Gout   . Colon polyps   . Diverticulosis   . Hyperlipemia   . Nephrolithiasis   . Hemorrhoids    Past Surgical History  Procedure Laterality Date  . Shoulder surgery      left  . Retropubic prostatectomy    . Colonoscopy w/ polypectomy    . Cataract extraction w/ intraocular lens  implant, bilateral      bi-lat   Family History  Problem Relation Age of Onset  . Kidney cancer Father   . Cancer Brother     ?  [name] Heart attack Brother 48  . Heart attack Sister 31  . Breast cancer Sister   . Diabetes Brother   . Alcohol abuse Brother   . Colon  cancer Father   . Colon polyps Brother    History  Substance Use Topics  . Smoking status: Never Smoker   . Smokeless tobacco: Never Used  . Alcohol Use: 0.6 oz/week    1 Cans of beer per week    Review of Systems  Constitutional: Positive for fever and fatigue. Negative for weight loss.  HENT: Negative for rhinorrhea.   Respiratory: Positive for cough. Negative for shortness of breath.   Cardiovascular: Negative for chest pain.  Musculoskeletal: Negative for myalgias.  All other systems reviewed and are negative.     Allergies  Review of patient's allergies indicates no known allergies.  Home Medications   Prior to Admission medications   Medication Sig Start Date End Date Taking? Authorizing Provider  dextromethorphan-guaiFENesin (MUCINEX DM) 30-600 MG per 12 hr tablet Take 1 tablet by mouth 2 (two) times daily.   Yes Historical Provider, MD  aspirin 81 MG tablet Take 81 mg by mouth daily.    Historical Provider, MD  Multiple Vitamin (MULTIVITAMIN) tablet Take 1 tablet by mouth daily.    Historical Provider, MD   BP 116/74 mmHg  Pulse 89  Temp(Src) 98.7 F (37.1 C) (Oral)  Resp 20  Ht 6' (1.829 m)  Wt 210 lb (95.255 kg)  BMI 28.47 kg/m2  SpO2 96% Physical Exam  Constitutional: He appears well-developed and well-nourished. No  distress.  HENT:  Head: Normocephalic and atraumatic.  Mouth/Throat: Oropharynx is clear and moist. No oropharyngeal exudate.  Eyes: Conjunctivae and EOM are normal. No scleral icterus.  Neck: Normal range of motion.  Cardiovascular: Normal rate and regular rhythm.  Exam reveals no gallop and no friction rub.   No murmur heard. Pulmonary/Chest: Effort normal. He has no wheezes. He has no rales. He exhibits no tenderness.  Inspiratory and expiratory rhonchi noted at the left lateral lung.   Abdominal: Soft. He exhibits no distension. There is no tenderness. There is no rebound.  Musculoskeletal: Normal range of motion.  Neurological: He is  alert. Coordination normal.  Speech is goal-oriented. Moves limbs without ataxia.   Skin: Skin is warm and dry.  Psychiatric: He has a normal mood and affect. His behavior is normal.  Nursing note and vitals reviewed.   ED Course  Procedures (including critical care time) Labs Review Labs Reviewed - No data to display  Imaging Review Dg Chest 2 View  07/01/2014   CLINICAL DATA:  Congestion.  Low-grade fever.  Productive cough.  EXAM: CHEST  2 VIEW  COMPARISON:  10/11/2009.  FINDINGS: Cardiopericardial silhouette within normal limits. Mediastinal contours normal. Trachea midline. No airspace disease or effusion. Lower cervical ACDF is present.  IMPRESSION: No active cardiopulmonary disease.   Electronically Signed   By: Dereck Ligas M.D.   On: 07/01/2014 13:32     EKG Interpretation None      MDM   Final diagnoses:  Cough  CAP (community acquired pneumonia)    1:52 PM Vitals stable and patient afebrile. Chest xray unremarkable. Patient has focal lung finding on physical exam. Will treat for CAP. Patient instructed to return with worsening for concerning symptoms. No respiratory distress or difficulty breathing.   Alvina Chou, PA-C 07/01/14 1358  Wandra Arthurs, MD 07/01/14 [phone]

## 2014-07-01 NOTE — Discharge Instructions (Signed)
Take Levaquin as directed until gone. Refer to attached documents for more information. Return to the ED with worsening or concerning symptoms.

## 2014-07-01 NOTE — ED Notes (Signed)
Pt c/o productive cough x 2 days- taking mucinex

## 2014-11-26 ENCOUNTER — Emergency Department (HOSPITAL_BASED_OUTPATIENT_CLINIC_OR_DEPARTMENT_OTHER): Payer: Medicare Other

## 2014-11-26 ENCOUNTER — Encounter (HOSPITAL_BASED_OUTPATIENT_CLINIC_OR_DEPARTMENT_OTHER): Payer: Self-pay

## 2014-11-26 ENCOUNTER — Emergency Department (HOSPITAL_BASED_OUTPATIENT_CLINIC_OR_DEPARTMENT_OTHER)
Admission: EM | Admit: 2014-11-26 | Discharge: 2014-11-26 | Disposition: A | Discharge status: Home / Self Care | Payer: Medicare Other | Attending: Emergency Medicine | Admitting: Emergency Medicine

## 2014-11-26 DIAGNOSIS — Z7982 Long term (current) use of aspirin: Secondary | ICD-10-CM | POA: Diagnosis not present

## 2014-11-26 DIAGNOSIS — R0981 Nasal congestion: Secondary | ICD-10-CM | POA: Insufficient documentation

## 2014-11-26 DIAGNOSIS — Z8719 Personal history of other diseases of the digestive system: Secondary | ICD-10-CM | POA: Insufficient documentation

## 2014-11-26 DIAGNOSIS — Z79899 Other long term (current) drug therapy: Secondary | ICD-10-CM | POA: Insufficient documentation

## 2014-11-26 DIAGNOSIS — Z86010 Personal history of colonic polyps: Secondary | ICD-10-CM | POA: Diagnosis not present

## 2014-11-26 DIAGNOSIS — R05 Cough: Secondary | ICD-10-CM | POA: Insufficient documentation

## 2014-11-26 DIAGNOSIS — Z8546 Personal history of malignant neoplasm of prostate: Secondary | ICD-10-CM | POA: Diagnosis not present

## 2014-11-26 DIAGNOSIS — Z87442 Personal history of urinary calculi: Secondary | ICD-10-CM | POA: Diagnosis not present

## 2014-11-26 DIAGNOSIS — Z792 Long term (current) use of antibiotics: Secondary | ICD-10-CM | POA: Diagnosis not present

## 2014-11-26 DIAGNOSIS — Z8739 Personal history of other diseases of the musculoskeletal system and connective tissue: Secondary | ICD-10-CM | POA: Diagnosis not present

## 2014-11-26 DIAGNOSIS — R079 Chest pain, unspecified: Secondary | ICD-10-CM | POA: Insufficient documentation

## 2014-11-26 DIAGNOSIS — R059 Cough, unspecified: Secondary | ICD-10-CM

## 2014-11-26 MED ORDER — FEXOFENADINE HCL 60 MG PO TABS: 60.0000 mg | ORAL_TABLET | Freq: Two times a day (BID) | ORAL | Status: DC

## 2014-11-26 MED ORDER — BENZONATATE 100 MG PO CAPS: 100.0000 mg | ORAL_CAPSULE | Freq: Three times a day (TID) | ORAL | Status: DC

## 2014-11-26 MED ORDER — DOXYCYCLINE HYCLATE 100 MG PO CAPS: 100.0000 mg | ORAL_CAPSULE | Freq: Two times a day (BID) | ORAL | Status: DC

## 2014-11-26 NOTE — Discharge Instructions (Signed)
Cough, Adult  A cough is a reflex that helps clear your throat and airways. It can help heal the body or may be a reaction to an irritated airway. A cough may only last 2 or 3 weeks (acute) or may last more than 8 weeks (chronic).  CAUSES Acute cough:  Viral or bacterial infections. Chronic cough:  Infections.  Allergies.  Asthma.  Post-nasal drip.  Smoking.  Heartburn or acid reflux.  Some medicines.  Chronic lung problems (COPD).  Cancer. SYMPTOMS   Cough.  Fever.  Chest pain.  Increased breathing rate.  High-pitched whistling sound when breathing (wheezing).  Colored mucus that you cough up (sputum). TREATMENT   A bacterial cough may be treated with antibiotic medicine.  A viral cough must run its course and will not respond to antibiotics.  Your caregiver may recommend other treatments if you have a chronic cough. HOME CARE INSTRUCTIONS   Only take over-the-counter or prescription medicines for pain, discomfort, or fever as directed by your caregiver. Use cough suppressants only as directed by your caregiver.  Use a cold steam vaporizer or humidifier in your bedroom or home to help loosen secretions.  Sleep in a semi-upright position if your cough is worse at night.  Rest as needed.  Stop smoking if you smoke. SEEK IMMEDIATE MEDICAL CARE IF:   You have pus in your sputum.  Your cough starts to worsen.  You cannot control your cough with suppressants and are losing sleep.  You begin coughing up blood.  You have difficulty breathing.  You develop pain which is getting worse or is uncontrolled with medicine.  You have a fever. MAKE SURE YOU:   Understand these instructions.  Will watch your condition.  Will get help right away if you are not doing well or get worse. Document Released: 01/05/2011 Document Revised: 10/01/2011 Document Reviewed: 01/05/2011 Exit[employer] Patient Information 2015 [employer]. This information is not intended  to replace advice given to you by your health care provider. Make sure you discuss any questions you have with your health care provider.  

## 2014-11-26 NOTE — ED Notes (Signed)
Pt reports 2 week history of cough, nasal congestion, sore throat.

## 2014-11-26 NOTE — ED Provider Notes (Signed)
CSN: [id number]     Arrival date & time 11/26/14  1612 History  This chart was scribed for Malvin Johns, MD by Steva Colder, ED Scribe. The patient was seen in room MHFT1/MHFT1 at 6:12 PM.     Chief Complaint  Patient presents with  . Cough     Patient is a 81 y.o. male presenting with cough. The history is provided by the patient. No language interpreter was used.  Cough Cough characteristics:  Productive Sputum characteristics:  Yellow Severity:  Moderate Onset quality:  Gradual Duration:  5 days Timing:  Unable to specify Progression:  Unchanged Chronicity:  New Smoker: no   Context: with activity   Context: not animal exposure, not exposure to allergens, not fumes, not occupational exposure, not sick contacts and not smoke exposure   Relieved by:  Nothing Worsened by:  Deep breathing Ineffective treatments: OTC medication. Associated symptoms: chest pain, rhinorrhea and sinus congestion   Associated symptoms: no chills, no diaphoresis, no fever, no headaches, no rash and no shortness of breath     HPI Comments: Chad Norris is a 81 y.o. male who presents to the Emergency Department complaining of productive cough onset 4-5 days. Pt notes that his cough is productive of yellow sputum. Pt notes that he just feels tired and run down. Pt thinks that he may have issues with allergies. Pt reports that he has had pneumonia several times in his life. He states that he is having associated symptoms of rhinorrhea, congestion, CP, fatigue. Pt reports that his CP happened for a period of 3-4 days at 5 second intervals (this was 2 weeks ago, no CP since that tims). He states that the CP occurs when he is eating, swallowing, or while breathing deep. Pt denies having a stress test recently. He states that he has tried OTC medication with no relief for his symptoms. He denies leg swelling, SOB, fever, and any other symptoms. Pt denies being a smoker. Pt PCP is Dr. Linna Darner.   Past Medical History   Diagnosis Date  . Prostate cancer   . Gout   . Colon polyps   . Diverticulosis   . Hyperlipemia   . Nephrolithiasis   . Hemorrhoids    Past Surgical History  Procedure Laterality Date  . Shoulder surgery      left  . Retropubic prostatectomy    . Colonoscopy w/ polypectomy    . Cataract extraction w/ intraocular lens  implant, bilateral      bi-lat   Family History  Problem Relation Age of Onset  . Kidney cancer Father   . Cancer Brother     ?  [name] Heart attack Brother 22  . Heart attack Sister 68  . Breast cancer Sister   . Diabetes Brother   . Alcohol abuse Brother   . Colon cancer Father   . Colon polyps Brother    History  Substance Use Topics  . Smoking status: Never Smoker   . Smokeless tobacco: Never Used  . Alcohol Use: 0.6 oz/week    1 Cans of beer per week    Review of Systems  Constitutional: Positive for fatigue. Negative for fever, chills and diaphoresis.  HENT: Positive for congestion and rhinorrhea. Negative for sneezing.   Eyes: Negative.   Respiratory: Positive for cough. Negative for chest tightness and shortness of breath.   Cardiovascular: Positive for chest pain. Negative for leg swelling.  Gastrointestinal: Negative for nausea, vomiting, abdominal pain, diarrhea and blood in stool.  Genitourinary:  Negative for frequency, hematuria, flank pain and difficulty urinating.  Musculoskeletal: Negative for back pain and arthralgias.  Skin: Negative for rash.  Neurological: Negative for dizziness, speech difficulty, weakness, numbness and headaches.      Allergies  Review of patient's allergies indicates no known allergies.  Home Medications   Prior to Admission medications   Medication Sig Start Date End Date Taking? Authorizing Provider  Multiple Vitamin (MULTIVITAMIN) tablet Take 1 tablet by mouth daily.   Yes Historical Provider, MD  aspirin 81 MG tablet Take 81 mg by mouth daily.    Historical Provider, MD  benzonatate (TESSALON) 100 MG  capsule Take 1 capsule (100 mg total) by mouth every 8 (eight) hours. 11/26/14   Malvin Johns, MD  dextromethorphan-guaiFENesin (MUCINEX DM) 30-600 MG per 12 hr tablet Take 1 tablet by mouth 2 (two) times daily.    Historical Provider, MD  doxycycline (VIBRAMYCIN) 100 MG capsule Take 1 capsule (100 mg total) by mouth 2 (two) times daily. One po bid x 7 days 11/26/14   Malvin Johns, MD  fexofenadine (ALLEGRA) 60 MG tablet Take 1 tablet (60 mg total) by mouth 2 (two) times daily. 11/26/14   Malvin Johns, MD  levofloxacin (LEVAQUIN) 750 MG tablet Take 1 tablet (750 mg total) by mouth daily. 07/01/14   Kaitlyn Szekalski, PA-C   BP 106/74 mmHg  Pulse 90  Temp(Src) 98.7 F (37.1 C) (Oral)  Resp 16  Ht 6' (1.829 m)  Wt 210 lb (95.255 kg)  BMI 28.47 kg/m2  SpO2 96%  Physical Exam  Constitutional: He is oriented to person, place, and time. He appears well-developed and well-nourished.  HENT:  Head: Normocephalic and atraumatic.  Right Ear: External ear normal.  Left Ear: External ear normal.  Mouth/Throat: Oropharynx is clear and moist.  Eyes: Pupils are equal, round, and reactive to light.  Neck: Normal range of motion. Neck supple.  Cardiovascular: Normal rate, regular rhythm and normal heart sounds.   Pulmonary/Chest: Effort normal and breath sounds normal. No respiratory distress. He has no wheezes. He has no rales. He exhibits no tenderness.  Abdominal: Soft. Bowel sounds are normal. There is no tenderness. There is no rebound and no guarding.  Musculoskeletal: Normal range of motion. He exhibits no edema.  Lymphadenopathy:    He has no cervical adenopathy.  Neurological: He is alert and oriented to person, place, and time.  Skin: Skin is warm and dry. No rash noted.  Psychiatric: He has a normal mood and affect.    ED Course  Procedures (including critical care time) DIAGNOSTIC STUDIES: Oxygen Saturation is 96% on RA, nl by my interpretation.    COORDINATION OF CARE:  6:20  PM-Discussed treatment plan which includes CXR and EKG with pt at bedside and pt agreed to plan.    Results for orders placed or performed in visit on 01/13/14  NMR Lipoprofile with Lipids  Result Value Ref Range   LDL Particle Number 2057 (H) <1000 nmol/L   LDL (calc) 126 (H) <100 mg/dL   HDL-C 41 >=40 mg/dL   Triglycerides 245 (H) <150 mg/dL   Cholesterol, Total 216 (H) <200 mg/dL   HDL Particle Number 28.2 (L) >=30.5 umol/L   Large HDL-P <1.3 (L) >=4.8 umol/L   Large VLDL-P 5.9 (H) <=2.7 nmol/L   Small LDL Particle Number 1207 (H) <=527 nmol/L   LDL Size 20.3 (L) >20.5 nm   HDL Size 8.4 (L) >=9.2 nm   VLDL Size 53.1 (H) <=46.6 nm   LP-IR  Score 81 (H) <=45   Dg Chest 2 View  11/26/2014   CLINICAL DATA:  Cough and chest congestion for 5 days. Personal history of prostate carcinoma.  EXAM: CHEST  2 VIEW  COMPARISON:  07/01/2014  FINDINGS: The heart size and mediastinal contours are within normal limits. Both lungs are clear. No evidence of pleural effusion. No mass or lymphadenopathy identified. Cervical spine fusion hardware again noted.  IMPRESSION: Stable exam.  No active cardiopulmonary disease.   Electronically Signed   By: Earle Gell M.D.   On: 11/26/2014 16:48       EKG Interpretation   Date/Time:  Friday Nov 26 2014 18:25:27 EDT Ventricular Rate:  80 PR Interval:  180 QRS Duration: 148 QT Interval:  428 QTC Calculation: 493 R Axis:   68 Text Interpretation:  Normal sinus rhythm Left bundle branch block  Abnormal ECG Confirmed by Nyla Creason  MD, Mishka Stegemann ([id number]) on 11/26/2014 6:53:06  PM      MDM   Final diagnoses:  Cough    Patient presents with cough and URI symptoms. He's afebrile. His lungs are clear without evidence of pneumonia. Given his age and worsening symptoms, I will go ahead and start him on doxycycline for probable bronchitis as well as Allegra and Tessalon Perles. He did report some chest pain but this happened 2 weeks ago. His EKG shows a left bundle  branch block which is noted on his prior EKG. He hasn't had a recent episodes of chest pain or shortness of breath. I encouraged him to have follow-up with his primary care physician regarding this. I also encouraged him to return to the ED if he has any recurrence of the chest pain.  I personally performed the services described in this documentation, which was scribed in my presence.  The recorded information has been reviewed and considered.    Malvin Johns, MD 11/26/14 [phone]

## 2015-01-17 ENCOUNTER — Other Ambulatory Visit: Payer: Self-pay

## 2015-03-18 DIAGNOSIS — C61 Malignant neoplasm of prostate: Secondary | ICD-10-CM | POA: Diagnosis not present

## 2015-03-24 DIAGNOSIS — C61 Malignant neoplasm of prostate: Secondary | ICD-10-CM | POA: Diagnosis not present

## 2015-03-24 DIAGNOSIS — N5201 Erectile dysfunction due to arterial insufficiency: Secondary | ICD-10-CM | POA: Diagnosis not present

## 2015-04-22 ENCOUNTER — Ambulatory Visit (INDEPENDENT_AMBULATORY_CARE_PROVIDER_SITE_OTHER): Payer: Medicare Other

## 2015-04-22 DIAGNOSIS — Z23 Encounter for immunization: Secondary | ICD-10-CM | POA: Diagnosis not present

## 2015-05-03 ENCOUNTER — Other Ambulatory Visit: Payer: Self-pay | Admitting: Internal Medicine

## 2015-05-12 ENCOUNTER — Encounter: Payer: Self-pay | Admitting: Internal Medicine

## 2015-05-12 ENCOUNTER — Ambulatory Visit (INDEPENDENT_AMBULATORY_CARE_PROVIDER_SITE_OTHER): Payer: Medicare Other | Admitting: Internal Medicine

## 2015-05-12 VITALS — BP 124/78 | HR 63 | Temp 98.2°F | Resp 16 | Wt 216.0 lb

## 2015-05-12 DIAGNOSIS — M5416 Radiculopathy, lumbar region: Secondary | ICD-10-CM | POA: Diagnosis not present

## 2015-05-12 DIAGNOSIS — Z23 Encounter for immunization: Secondary | ICD-10-CM | POA: Diagnosis not present

## 2015-05-12 MED ORDER — TRAMADOL HCL 50 MG PO TABS: 50.0000 mg | ORAL_TABLET | Freq: Three times a day (TID) | ORAL | Status: DC | PRN

## 2015-05-12 NOTE — Progress Notes (Signed)
   Subjective:    Patient ID: Chad Norris, male    DOB: 13-Jul-1932, 81 y.o.   MRN: [id number]  HPI He describes pain in the right inguinal area over the last week without any specific injury or trigger. It is worse when he begins to mobilize. He can walk up to 200 yards before it increases in severity. It has been better today without any specific intervention.  He has received epidural steroid injections to his back on 3-4 occasions from a Neurosurgeon. Occasionally he will have some numbness and tingling into the right upper leg.  He denies any constitutional, GI, or GU symptoms  Review of Systems No fever, chills or sweats. Unexplained weight loss, abdominal pain, significant dyspepsia, dysphagia, melena, rectal bleeding, or persistently small caliber stools are denied.  Dysuria, pyuria, hematuria, frequency, nocturia or polyuria are denied.    Objective:   Physical Exam  Pattern alopecia is present. He has decreased hearing even with hearing aids. He does have crepitus of the knees. There is hyperpigmentation of the right medial thigh which is bland without maceration, postural, or vesicles. There is no evidence of epididymitis; he does have some varices on the left. There are no direct or indirect hernias present. There is full range of motion of the right lower extremity and hip.  He broke down in tears as discussed his friend being placed on hospice today.      Assessment & Plan:  #1 L1 lumbar radiculopathy  Plan: See orders and recommendations. Neurosurgery consult if symptoms fail to resolve.

## 2015-05-12 NOTE — Progress Notes (Signed)
Pre visit review using our clinic review tool, if applicable. No additional management support is needed unless otherwise documented below in the visit note. 

## 2015-05-12 NOTE — Patient Instructions (Signed)
If the  symptoms persist or progress despite the medication; Neurosurgery should be reconsulted

## 2015-05-13 ENCOUNTER — Encounter: Payer: Self-pay | Admitting: Internal Medicine

## 2015-05-13 ENCOUNTER — Telehealth: Payer: Self-pay | Admitting: Emergency Medicine

## 2015-05-13 DIAGNOSIS — M5416 Radiculopathy, lumbar region: Secondary | ICD-10-CM | POA: Diagnosis not present

## 2015-05-13 DIAGNOSIS — Z23 Encounter for immunization: Secondary | ICD-10-CM | POA: Diagnosis not present

## 2015-05-13 NOTE — Telephone Encounter (Signed)
-----   Message from Hendricks Limes, MD sent at 05/13/2015  3:14 PM EDT ----- It may have been Lovena Le who gave him shot,not Stef.  If whoever gave shot will sign off; I'll close chart. Thanks very much.Sorry for my confusion. [employer]

## 2015-05-26 ENCOUNTER — Encounter: Payer: Self-pay | Admitting: Internal Medicine

## 2015-05-26 ENCOUNTER — Ambulatory Visit (INDEPENDENT_AMBULATORY_CARE_PROVIDER_SITE_OTHER): Payer: Medicare Other | Admitting: Internal Medicine

## 2015-05-26 VITALS — BP 118/72 | HR 81 | Temp 98.2°F | Ht 72.0 in | Wt 212.0 lb

## 2015-05-26 DIAGNOSIS — R509 Fever, unspecified: Secondary | ICD-10-CM

## 2015-05-26 DIAGNOSIS — J310 Chronic rhinitis: Secondary | ICD-10-CM | POA: Diagnosis not present

## 2015-05-26 LAB — POCT URINALYSIS DIPSTICK
Bilirubin, UA: NEGATIVE
Blood, UA: NEGATIVE
Glucose, UA: NEGATIVE
Ketones, UA: NEGATIVE
Leukocytes, UA: NEGATIVE
Nitrite, UA: NEGATIVE
Protein, UA: POSITIVE
Spec Grav, UA: 1.02
Urobilinogen, UA: 0.2
pH, UA: 6

## 2015-05-26 MED ORDER — AMOXICILLIN 500 MG PO CAPS: 500.0000 mg | ORAL_CAPSULE | Freq: Three times a day (TID) | ORAL | Status: DC

## 2015-05-26 NOTE — Patient Instructions (Addendum)
Plain Mucinex (NOT D) for thick secretions ;force NON dairy fluids .   Nasal cleansing in the shower as discussed with lather of mild shampoo.After 10 seconds wash off lather while  exhaling through nostrils. Make sure that all residual soap is removed to prevent irritation.  Flonase OR Nasacort AQ 1 spray in each nostril twice a day as needed. Use the "crossover" technique into opposite nostril spraying toward opposite ear @ 45 degree angle, not straight up into nostril.  Plain Allegra (NOT D )  160 daily , Loratidine 10 mg , OR Zyrtec 10 mg @ bedtime  as needed for itchy eyes & sneezing.  Fill the  prescription for antibiotic if fever, discolored nasal or chest secretions or significant pain above & below eyes appear in the next 48-72 hours.  NSAIDS ( Aleve, Advil, Naproxen) or Tylenol every 4 hrs as needed for fever as discussed based on label recommendations

## 2015-05-26 NOTE — Progress Notes (Signed)
   Subjective:    Patient ID: Chad Norris, male    DOB: 1931-09-22, 81 y.o.   MRN: [id number]  HPI He states that symptoms began as his head feeling "flushed" associated with chills 10/30 /16. On 10/31 he had chills and clear nasal discharge. He describes discomfort in the maxillary sinus area along with itchy/watery eyes and sore throat. The cough is productive of white phlegm.   On 11/2 he had a temperature between 4-9 PM up to 102. Since that time he's felt physically tired and fatigued. He has taken Mucinex with improvement in the cough.   Review of Systems  Frontal headache,  nasal purulence, dental pain,  otic pain or otic discharge denied.  No diarrhea present.Cola colored urine or clay colored stools denied.  Dysuria, pyuria, or hematuria not present.  No new rashes, pustules, vesicles.  No redness or swelling of joints.  No significant travel, pets, or tick exposures.      Objective:   Physical Exam   Pertinent or positive findings include: pattern alopecia is present. He has bilateral hearing aids. Tympanic membranes are dull. Has marked erythema of the nasal mucosa.   General appearance :adequately nourished; in no distress.  Eyes: No conjunctival inflammation or scleral icterus is present.  Oral exam:  Lips and gums are healthy appearing.There is no oropharyngeal erythema or exudate noted. Dental hygiene is good.  Heart:  Normal rate and regular rhythm. S1 and S2 normal without gallop, murmur, click, rub or other extra sounds    Lungs:Chest clear to auscultation; no wheezes, rhonchi,rales ,or rubs present.No increased work of breathing.   Abdomen: bowel sounds normal, soft and non-tender without masses, organomegaly or hernias noted.  No guarding or rebound. No flank tenderness to percussion.  Vascular : all pulses equal ; no bruits present.  Skin:Warm & dry.  Intact without suspicious lesions or rashes ; no tenting or jaundice   Lymphatic: No  lymphadenopathy is noted about the head, neck, axilla, or inguinal areas.   Neuro: Strength, tone & DTRs normal.        Assessment & Plan:  #1 non allergic rhinitis #2 cough See Orders & AVS

## 2015-06-20 ENCOUNTER — Encounter: Payer: Self-pay | Admitting: Internal Medicine

## 2015-06-20 ENCOUNTER — Encounter: Payer: Self-pay | Admitting: Cardiology

## 2015-07-11 DIAGNOSIS — H524 Presbyopia: Secondary | ICD-10-CM | POA: Diagnosis not present

## 2015-07-11 DIAGNOSIS — Z961 Presence of intraocular lens: Secondary | ICD-10-CM | POA: Diagnosis not present

## 2016-03-23 ENCOUNTER — Emergency Department (HOSPITAL_COMMUNITY): Payer: Medicare Other

## 2016-03-23 ENCOUNTER — Encounter (HOSPITAL_COMMUNITY): Payer: Self-pay | Admitting: Emergency Medicine

## 2016-03-23 ENCOUNTER — Telehealth: Payer: Self-pay | Admitting: Internal Medicine

## 2016-03-23 ENCOUNTER — Emergency Department (HOSPITAL_COMMUNITY)
Admission: EM | Admit: 2016-03-23 | Discharge: 2016-03-23 | Disposition: A | Discharge status: Home / Self Care | Payer: Medicare Other | Attending: Emergency Medicine | Admitting: Emergency Medicine

## 2016-03-23 DIAGNOSIS — Z7982 Long term (current) use of aspirin: Secondary | ICD-10-CM | POA: Diagnosis not present

## 2016-03-23 DIAGNOSIS — Z8546 Personal history of malignant neoplasm of prostate: Secondary | ICD-10-CM | POA: Insufficient documentation

## 2016-03-23 DIAGNOSIS — R200 Anesthesia of skin: Secondary | ICD-10-CM | POA: Diagnosis not present

## 2016-03-23 DIAGNOSIS — M79604 Pain in right leg: Secondary | ICD-10-CM | POA: Diagnosis present

## 2016-03-23 LAB — I-STAT CHEM 8, ED
BUN: 20 mg/dL (ref 6–20)
Calcium, Ion: 1.12 mmol/L — ABNORMAL LOW (ref 1.15–1.40)
Chloride: 104 mmol/L (ref 101–111)
Creatinine, Ser: 1.1 mg/dL (ref 0.61–1.24)
Glucose, Bld: 131 mg/dL — ABNORMAL HIGH (ref 65–99)
HCT: 40 % (ref 39.0–52.0)
Hemoglobin: 13.6 g/dL (ref 13.0–17.0)
Potassium: 3.9 mmol/L (ref 3.5–5.1)
Sodium: 140 mmol/L (ref 135–145)
TCO2: 24 mmol/L (ref 0–100)

## 2016-03-23 LAB — URINALYSIS, ROUTINE W REFLEX MICROSCOPIC
Bilirubin Urine: NEGATIVE
Glucose, UA: NEGATIVE mg/dL
Hgb urine dipstick: NEGATIVE
Ketones, ur: NEGATIVE mg/dL
Leukocytes, UA: NEGATIVE
Nitrite: NEGATIVE
Protein, ur: NEGATIVE mg/dL
Specific Gravity, Urine: 1.017 (ref 1.005–1.030)
pH: 6.5 (ref 5.0–8.0)

## 2016-03-23 LAB — I-STAT TROPONIN, ED: Troponin i, poc: 0 ng/mL (ref 0.00–0.08)

## 2016-03-23 MED ORDER — LORAZEPAM 2 MG/ML IJ SOLN: 1.0000 mg | Freq: Once | INTRAMUSCULAR | Status: DC

## 2016-03-23 MED ORDER — GADOBENATE DIMEGLUMINE 529 MG/ML IV SOLN
20.0000 mL | Freq: Once | INTRAVENOUS | Status: AC | PRN
  Administered 2016-03-23: 20 mL via INTRAVENOUS

## 2016-03-23 NOTE — ED Notes (Signed)
Pt c/o right leg numbness started 1 week ago, after playing golf, has had low back for past few days.  Had an episode of chest pain 2-3 days ago, now sore over left chest area.

## 2016-03-23 NOTE — ED Notes (Signed)
Pt transported to MRI 

## 2016-03-23 NOTE — Telephone Encounter (Signed)
Patient Name: PAXSON STEFANOVIC DOB: 11/25/1931 Initial Comment Caller states husband's right leg is numb and has been for a week. Nurse Assessment Nurse: Ardine Bjork, RN, Melissa Date/Time (Eastern Time): 03/23/2016 1:27:41 PM Confirm and document reason for call. If symptomatic, describe symptoms. You must click the next button to save text entered. ---Caller states husband's right leg is numb and has been for a week. Numb and cold from groin down. Has the patient traveled out of the country within the last 30 days? ---Not Applicable Does the patient have any new or worsening symptoms? ---Yes Will a triage be completed? ---Yes Related visit to physician within the last 2 weeks? ---N/A Does the PT have any chronic conditions? (i.e. diabetes, asthma, etc.) ---No Is this a behavioral health or substance abuse call? ---No Guidelines Guideline Title Affirmed Question Affirmed Notes Neurologic Deficit [1] Numbness (i.e., loss of sensation) of the face, arm / hand, or leg / foot on one side of the body AND [2] sudden onset AND [3] present now Neurologic Deficit [1] Weakness (i.e., paralysis, loss of muscle strength) of the face, arm / hand, or leg / foot on one side of the body AND [2] sudden onset AND [3] present now Final Disposition User Call EMS 911 Now Zayas, RN, Melissa Comments Wife to drive pt to ED-states right leg numb from groin downleg is also cold-has been trying to warm leg up by exercising but continues to feel cold. Referrals [facility] - ED

## 2016-03-23 NOTE — ED Triage Notes (Signed)
Pt presents with numbness to R leg x 2 weeks ago.  Pt reports numbness from upper thigh down to his foot.  Pt reports his foot "feels cold", denies any pain or swelling.  Pt reports car ride to [place] recently but after numbness began.  Pt reports L sided chest pain x 4-5 days ago but denies any at present, denies any shortness of breath.

## 2016-03-23 NOTE — ED Notes (Signed)
PT continues to be in MRI

## 2016-03-23 NOTE — ED Notes (Signed)
Pt given urinal.  Encouraged to urinate

## 2016-03-23 NOTE — ED Provider Notes (Signed)
[facility] DEPT Provider Note   CSN: [id number] Arrival date & time: 03/23/16  1428     History   Chief Complaint Chief Complaint  Patient presents with  . Leg Pain    HPI Chad Norris is a 82 y.o. male.  Patient presents to the ER with right leg numbness that started proximal me 5 days ago after playing golf. Patient's also had mild low back pain for the past few days. Back pain is not significant patient has had intermittent in the past. Patient does have a history of prostate cancer however no known spread of cancer to the lumbar spine. Patient had surgery years ago at another hospital. Patient denies any weakness. No arm or cranial nerve symptoms. No stroke history. No fevers or chills. Patient does feel generally tired however he was playing significant amount of golf recently. Patient had brief left-sided chest pain constant for 2 days straight 5 days ago that resolved. No cardiac history. No chest pain since. No diaphoresis.      Past Medical History:  Diagnosis Date  . Colon polyps   . Diverticulosis   . Gout   . Hemorrhoids   . Hyperlipemia   . Nephrolithiasis   . Prostate cancer [facility])     Patient Active Problem List   Diagnosis Date Noted  . DDD (degenerative disc disease), lumbosacral 01/13/2014  . Tonsillith 12/07/2011  . HYPERLIPIDEMIA 06/21/2008  . NONSPECIFIC ABNORMAL ELECTROCARDIOGRAM 06/21/2008  . PROSTATE CANCER, HX OF 06/21/2008  . COLONIC POLYPS, HX OF 06/21/2008  . DIVERTICULOSIS, COLON 05/07/2008  . NEPHROLITHIASIS, HX OF 05/07/2008  . GOUT 10/09/2007    Past Surgical History:  Procedure Laterality Date  . CATARACT EXTRACTION W/ INTRAOCULAR LENS  IMPLANT, BILATERAL     bi-lat  . COLONOSCOPY W/ POLYPECTOMY    . Epidural steroid injections     3-4, Dr Ellene Route  . RETROPUBIC PROSTATECTOMY    . SHOULDER SURGERY     left       Home Medications    Prior to Admission medications   Medication Sig Start Date End Date Taking? Authorizing  Provider  aspirin 325 MG tablet Take 325 mg by mouth every 6 (six) hours as needed for moderate pain.   Yes Historical Provider, MD  ibuprofen (ADVIL,MOTRIN) 200 MG tablet Take 400 mg by mouth every 6 (six) hours as needed for moderate pain.   Yes Historical Provider, MD  Multiple Vitamins-Minerals (ADULT ONE DAILY GUMMIES) CHEW Chew 2 each by mouth daily.   Yes Historical Provider, MD  amoxicillin (AMOXIL) 500 MG capsule Take 1 capsule (500 mg total) by mouth 3 (three) times daily. Patient not taking: Reported on 03/23/2016 05/26/15   Hendricks Limes, MD  traMADol (ULTRAM) 50 MG tablet Take 1 tablet (50 mg total) by mouth every 8 (eight) hours as needed. Patient not taking: Reported on 03/23/2016 05/12/15   Hendricks Limes, MD    Family History Family History  Problem Relation Age of Onset  . Kidney cancer Father   . Colon cancer Father   . Cancer Brother     ?  [name] Heart attack Brother 56  . Heart attack Sister 44  . Breast cancer Sister   . Diabetes Brother   . Alcohol abuse Brother   . Colon polyps Brother     Social History Social History  Substance Use Topics  . Smoking status: Never Smoker  . Smokeless tobacco: Never Used  . Alcohol use 0.6 oz/week    1 Cans  of beer per week     Allergies   Review of patient's allergies indicates no known allergies.   Review of Systems Review of Systems  Constitutional: Negative for chills and fever.  HENT: Negative for congestion.   Eyes: Negative for visual disturbance.  Respiratory: Negative for shortness of breath.   Cardiovascular: Negative for chest pain.  Gastrointestinal: Negative for abdominal pain and vomiting.  Genitourinary: Negative for dysuria and flank pain.  Musculoskeletal: Positive for back pain. Negative for neck pain and neck stiffness.  Skin: Negative for rash.  Neurological: Positive for numbness. Negative for weakness, light-headedness and headaches.     Physical Exam Updated Vital Signs BP 152/84    Pulse (!) 50   Temp 98.1 F (36.7 C) (Oral)   Resp 17   Ht 6' (1.829 m)   Wt 219 lb (99.3 kg)   SpO2 98%   BMI 29.70 kg/m   Physical Exam  Constitutional: He is oriented to person, place, and time. He appears well-developed and well-nourished.  HENT:  Head: Normocephalic and atraumatic.  Eyes: Conjunctivae are normal. Right eye exhibits no discharge. Left eye exhibits no discharge.  Neck: Normal range of motion. Neck supple. No tracheal deviation present.  Cardiovascular: Normal rate and regular rhythm.   Pulmonary/Chest: Effort normal and breath sounds normal.  Abdominal: Soft. He exhibits no distension. There is no tenderness. There is no guarding.  Musculoskeletal: He exhibits no edema.  Neurological: He is alert and oriented to person, place, and time. GCS eye subscore is 4. GCS verbal subscore is 5. GCS motor subscore is 6.  5+ strength in UE and LE with f/e at major joints. Sensation to palpation intact in UE and LE. Patient has subjective decreased sensation in the entire right leg versus left, no back tenderness CNs 2-12 grossly intact.  EOMFI.  PERRL.   Finger nose and coordination intact bilateral.   Visual fields intact to finger testing. No nystagmus 2+ DP and PT pulses LEs   Skin: Skin is warm. No rash noted.  Psychiatric: He has a normal mood and affect.  Nursing note and vitals reviewed.    ED Treatments / Results  Labs (all labs ordered are listed, but only abnormal results are displayed) Labs Reviewed  I-STAT CHEM 8, ED - Abnormal; Notable for the following:       Result Value   Glucose, Bld 131 (*)    Calcium, Ion 1.12 (*)    All other components within normal limits  URINALYSIS, ROUTINE W REFLEX MICROSCOPIC (NOT AT [facility])  Randolm Idol, ED    EKG  EKG Interpretation  Date/Time:  Friday March 23 2016 14:37:36 EDT Ventricular Rate:  66 PR Interval:  172 QRS Duration: 150 QT Interval:  454 QTC Calculation: 475 R Axis:   -31 Text  Interpretation:  Normal sinus rhythm Left axis deviation Left bundle branch block Abnormal ECG similar in past Confirmed by Jaycie Kregel MD, Vonna Kotyk [phone]) on 03/23/2016 3:13:52 PM       Radiology Mr Thoracic Spine W Wo Contrast  Result Date: 03/23/2016 CLINICAL DATA:  RIGHT leg numbness beginning 1 week ago while playing golf. Low back pain for a few days. LEFT chest pain/soreness. History of prostate cancer. EXAM: MRI THORACIC AND LUMBAR SPINE WITHOUT AND WITH CONTRAST TECHNIQUE: Multiplanar and multiecho pulse sequences of the thoracic and lumbar spine were obtained without and with intravenous contrast. CONTRAST:  16mL MULTIHANCE GADOBENATE DIMEGLUMINE 529 MG/ML IV SOLN COMPARISON:  None. FINDINGS: MRI THORACIC SPINE FINDINGS ALIGNMENT:  Maintenance of the thoracic kyphosis. No malalignment. VERTEBRAE/DISCS: Status post C5 through C7 ACDF. Vertebral bodies are intact. Intervertebral discs morphology and signal are normal. No abnormal bone marrow signal to suggest acute osseous process. No abnormal osseous or intradiscal enhancement. Scattered old Schmorl's node. Mild enhancing acute T10 inferior endplate Schmorl's node. CORD: Thoracic spinal cord is normal morphology and signal characteristics ; conus medullaris not included. No abnormal cord or leptomeningeal enhancement. PREVERTEBRAL AND PARASPINAL SOFT TISSUES: Normal. 6 mm LEFT T10 perineural cyst. DISC LEVELS: No significant disc bulge, canal stenosis or neural foraminal narrowing any thoracic level. MRI LUMBAR SPINE FINDINGS SEGMENTATION: For the purposes of this report, the last well-formed intervertebral disc will be described as L5-S1. ALIGNMENT: No malalignment.  Maintenance of the lumbar lordosis. VERTEBRAE:Lumbar vertebral bodies intact. Intervertebral discs demonstrate normal morphology, mild desiccation. Faint central mineralization L2-3. Moderate subacute on chronic discogenic endplate changes [id number]. Mild acute on chronic discogenic endplate changes  [id number]. No suspicious bone marrow signal to suggest fracture. No abnormal osseous or intradiscal enhancement. CONUS MEDULLARIS: Conus medullaris terminates at L1-2 and demonstrates normal morphology and signal characteristics. Cauda equina is normal. PARASPINAL AND SOFT TISSUES: Included prevertebral and paraspinal soft tissues are normal. DISC LEVELS: T12-L1, L1-2: No disc bulge, canal stenosis nor neural foraminal narrowing. L2-3: Small broad-based disc bulge. Mild canal stenosis including partial effacement of the RIGHT lateral recess which could affect the traversing RIGHT L3 nerve. No neural foraminal narrowing. L3-4: Small broad-based disc bulge. Mild facet arthropathy and ligamentum flavum redundancy. Mild canal stenosis with partial lateral recess effacement which could affect the traversing L4 nerves. Mild bilateral caudal neural foraminal narrowing. L4-5: Small central disc protrusion enhancing annular fissure. Mild facet arthropathy and ligamentum flavum redundancy. Mild canal stenosis. Minimal LEFT neural foraminal narrowing. L5-S1: Small central disc protrusion and enhancing annular fissure. Mild facet arthropathy and ligamentum flavum redundancy without canal stenosis. No significant neural foraminal narrowing. IMPRESSION: MRI THORACIC SPINE: Early degenerative change. No acute fracture or malalignment. No canal stenosis or neural foraminal narrowing. MRI LUMBAR SPINE: Small disc protrusions and annular fissures. No fracture or malalignment. Mild canal stenosis L2-3 through L4-5. Minimal to mild L3-4 and L4-5 neural foraminal narrowing. Electronically Signed   By: Elon Alas M.D.   On: 03/23/2016 20:21   Mr Lumbar Spine W Wo Contrast  Result Date: 03/23/2016 CLINICAL DATA:  RIGHT leg numbness beginning 1 week ago while playing golf. Low back pain for a few days. LEFT chest pain/soreness. History of prostate cancer. EXAM: MRI THORACIC AND LUMBAR SPINE WITHOUT AND WITH CONTRAST TECHNIQUE:  Multiplanar and multiecho pulse sequences of the thoracic and lumbar spine were obtained without and with intravenous contrast. CONTRAST:  64mL MULTIHANCE GADOBENATE DIMEGLUMINE 529 MG/ML IV SOLN COMPARISON:  None. FINDINGS: MRI THORACIC SPINE FINDINGS ALIGNMENT: Maintenance of the thoracic kyphosis. No malalignment. VERTEBRAE/DISCS: Status post C5 through C7 ACDF. Vertebral bodies are intact. Intervertebral discs morphology and signal are normal. No abnormal bone marrow signal to suggest acute osseous process. No abnormal osseous or intradiscal enhancement. Scattered old Schmorl's node. Mild enhancing acute T10 inferior endplate Schmorl's node. CORD: Thoracic spinal cord is normal morphology and signal characteristics ; conus medullaris not included. No abnormal cord or leptomeningeal enhancement. PREVERTEBRAL AND PARASPINAL SOFT TISSUES: Normal. 6 mm LEFT T10 perineural cyst. DISC LEVELS: No significant disc bulge, canal stenosis or neural foraminal narrowing any thoracic level. MRI LUMBAR SPINE FINDINGS SEGMENTATION: For the purposes of this report, the last well-formed intervertebral disc will be described as L5-S1. ALIGNMENT:  No malalignment.  Maintenance of the lumbar lordosis. VERTEBRAE:Lumbar vertebral bodies intact. Intervertebral discs demonstrate normal morphology, mild desiccation. Faint central mineralization L2-3. Moderate subacute on chronic discogenic endplate changes [id number]. Mild acute on chronic discogenic endplate changes [id number]. No suspicious bone marrow signal to suggest fracture. No abnormal osseous or intradiscal enhancement. CONUS MEDULLARIS: Conus medullaris terminates at L1-2 and demonstrates normal morphology and signal characteristics. Cauda equina is normal. PARASPINAL AND SOFT TISSUES: Included prevertebral and paraspinal soft tissues are normal. DISC LEVELS: T12-L1, L1-2: No disc bulge, canal stenosis nor neural foraminal narrowing. L2-3: Small broad-based disc bulge. Mild canal stenosis  including partial effacement of the RIGHT lateral recess which could affect the traversing RIGHT L3 nerve. No neural foraminal narrowing. L3-4: Small broad-based disc bulge. Mild facet arthropathy and ligamentum flavum redundancy. Mild canal stenosis with partial lateral recess effacement which could affect the traversing L4 nerves. Mild bilateral caudal neural foraminal narrowing. L4-5: Small central disc protrusion enhancing annular fissure. Mild facet arthropathy and ligamentum flavum redundancy. Mild canal stenosis. Minimal LEFT neural foraminal narrowing. L5-S1: Small central disc protrusion and enhancing annular fissure. Mild facet arthropathy and ligamentum flavum redundancy without canal stenosis. No significant neural foraminal narrowing. IMPRESSION: MRI THORACIC SPINE: Early degenerative change. No acute fracture or malalignment. No canal stenosis or neural foraminal narrowing. MRI LUMBAR SPINE: Small disc protrusions and annular fissures. No fracture or malalignment. Mild canal stenosis L2-3 through L4-5. Minimal to mild L3-4 and L4-5 neural foraminal narrowing. Electronically Signed   By: Elon Alas M.D.   On: 03/23/2016 20:21    Procedures Procedures (including critical care time)  Medications Ordered in ED Medications  LORazepam (ATIVAN) injection 1 mg (1 mg Intravenous Not Given 03/23/16 1838)  gadobenate dimeglumine (MULTIHANCE) injection 20 mL (20 mLs Intravenous Contrast Given 03/23/16 2002)     Initial Impression / Assessment and Plan / ED Course  I have reviewed the triage vital signs and the nursing notes.  Pertinent labs & imaging results that were available during my care of the patient were reviewed by me and considered in my medical decision making (see chart for details).  Clinical Course   Patient presents with persistent right leg numbness and intermittent back pain. With cancer history and new numbness in the right plan for MRI to look for cause of his signs and  symptoms.  Patient has no other neurologic findings.  Patient had very atypical chest pain after swinging a golf club all day long. It wasn't specifically exertional. Plan for one troponin and EKG and outpatient follow-up for this. No chest pain in the past 3 days.   MRI  Negative for any significant findings.  Results and differential diagnosis were discussed with the patient/parent/guardian. Xrays were independently reviewed by myself.  Close follow up outpatient was discussed, comfortable with the plan.   Medications  LORazepam (ATIVAN) injection 1 mg (1 mg Intravenous Not Given 03/23/16 1838)  gadobenate dimeglumine (MULTIHANCE) injection 20 mL (20 mLs Intravenous Contrast Given 03/23/16 2002)    Vitals:   03/23/16 1431 03/23/16 1441 03/23/16 1712 03/23/16 1715  BP: 146/87  153/88 152/84  Pulse: (!) 57  (!) 51 (!) 50  Resp: 18  18 17   Temp: 98.1 F (36.7 C)     TempSrc: Oral     SpO2: 97%  99% 98%  Weight:  219 lb (99.3 kg)    Height:  6' (1.829 m)      Final diagnoses:  Right leg numbness  Right leg numbness  Final Clinical Impressions(s) / ED Diagnoses   Final diagnoses:  Right leg numbness  Right leg numbness    New Prescriptions New Prescriptions   No medications on file     Elnora Morrison, MD 03/23/16 2115

## 2016-03-23 NOTE — Discharge Instructions (Signed)
Return for worsening or new neurologic symptoms.   If you were given medicines take as directed.  If you are on coumadin or contraceptives realize their levels and effectiveness is altered by many different medicines.  If you have any reaction (rash, tongues swelling, other) to the medicines stop taking and see a physician.    If your blood pressure was elevated in the ER make sure you follow up for management with a primary doctor or return for chest pain, shortness of breath or stroke symptoms.  Please follow up as directed and return to the ER or see a physician for new or worsening symptoms.  Thank you. Vitals:   03/23/16 1431 03/23/16 1441 03/23/16 1712 03/23/16 1715  BP: 146/87  153/88 152/84  Pulse: (!) 57  (!) 51 (!) 50  Resp: 18  18 17   Temp: 98.1 F (36.7 C)     TempSrc: Oral     SpO2: 97%  99% 98%  Weight:  219 lb (99.3 kg)    Height:  6' (1.829 m)

## 2016-10-17 NOTE — Telephone Encounter (Signed)
Pt has not established with Dr. Quay Burow.

## 2016-10-18 IMAGING — CR DG CHEST 2V
2 series · 2 of 2 positions shown · non-contrast
Comparison: 07/01/2014

CLINICAL DATA: Cough and chest congestion for 5 days. Personal
history of prostate carcinoma.

EXAM:
CHEST  2 VIEW

[w chest pa]
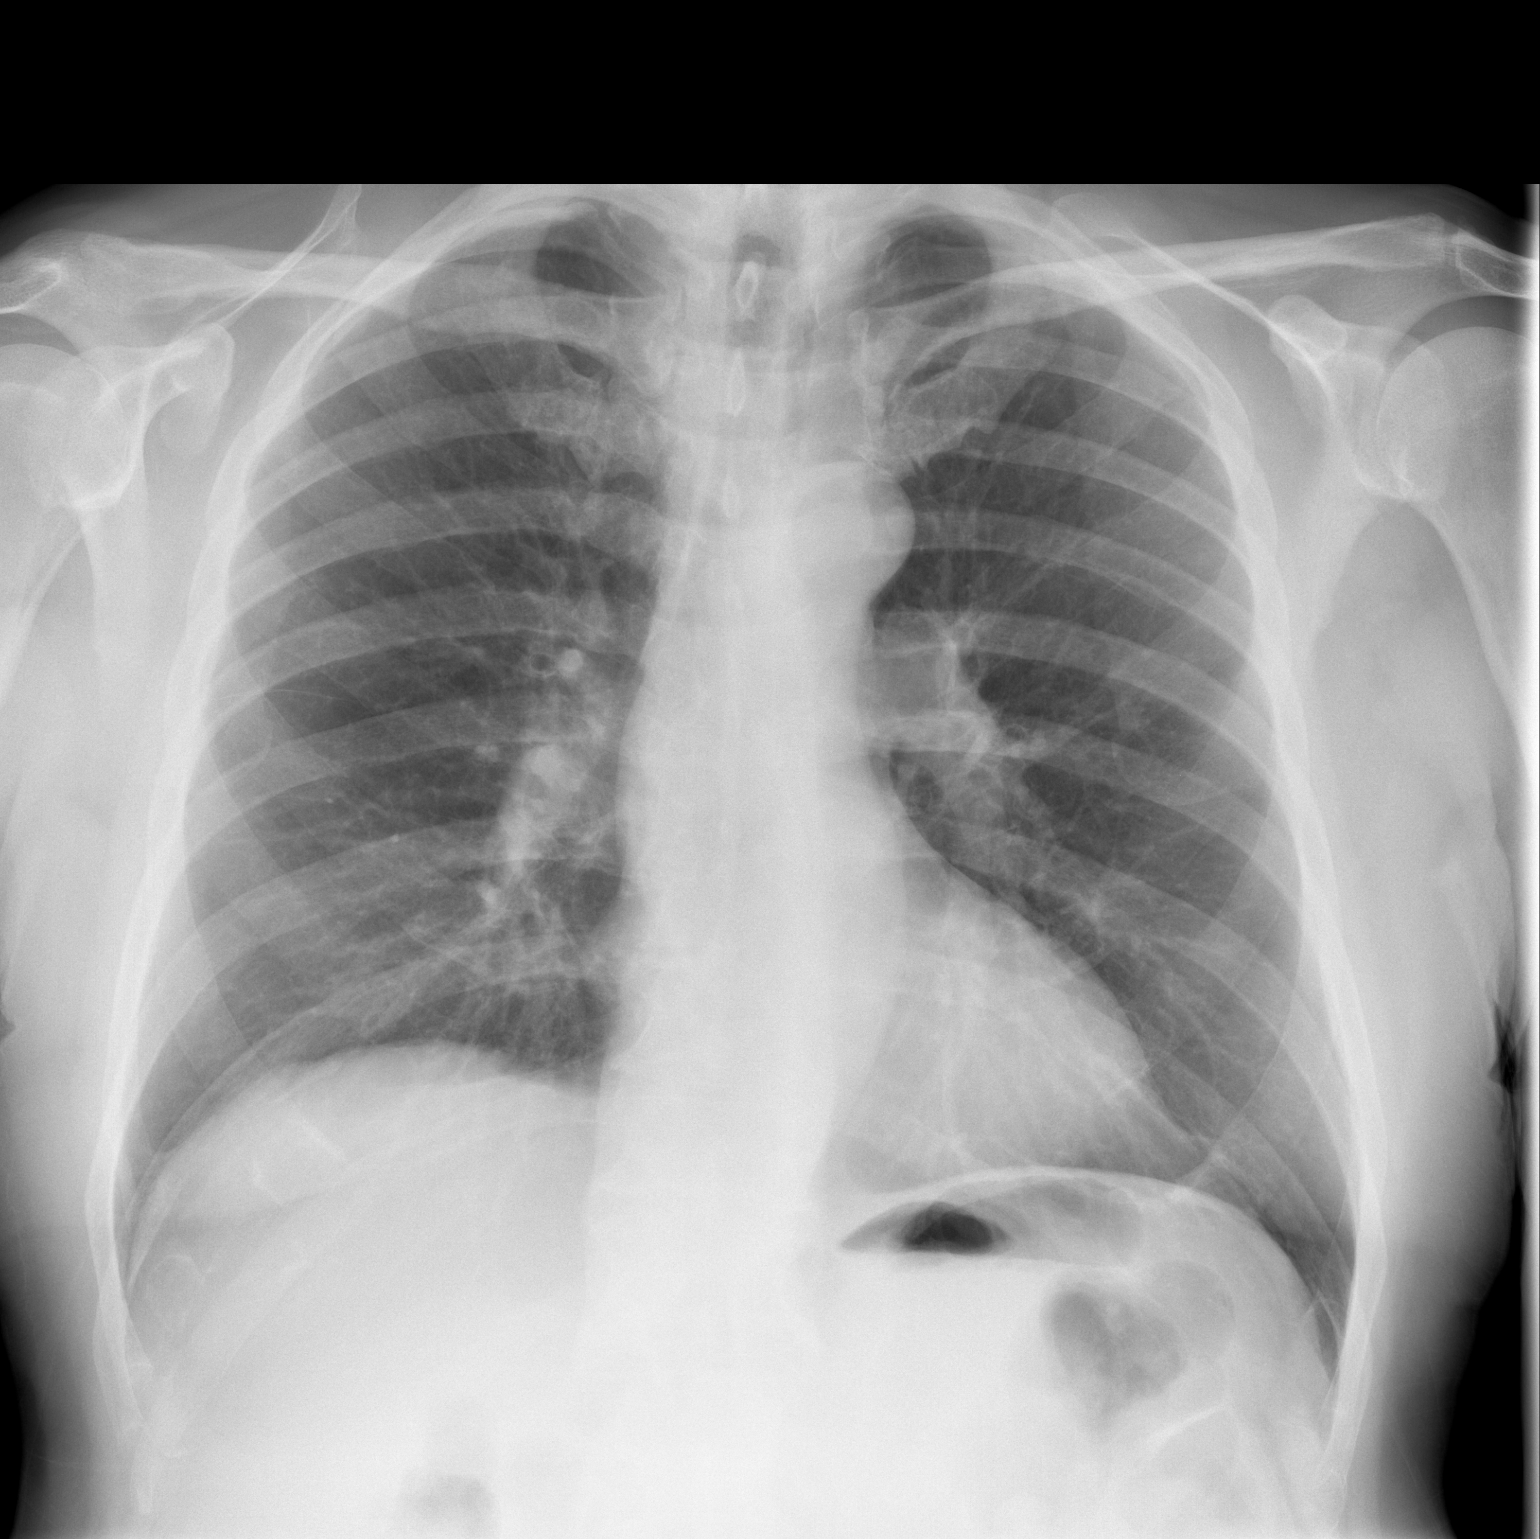

[w chest lat]
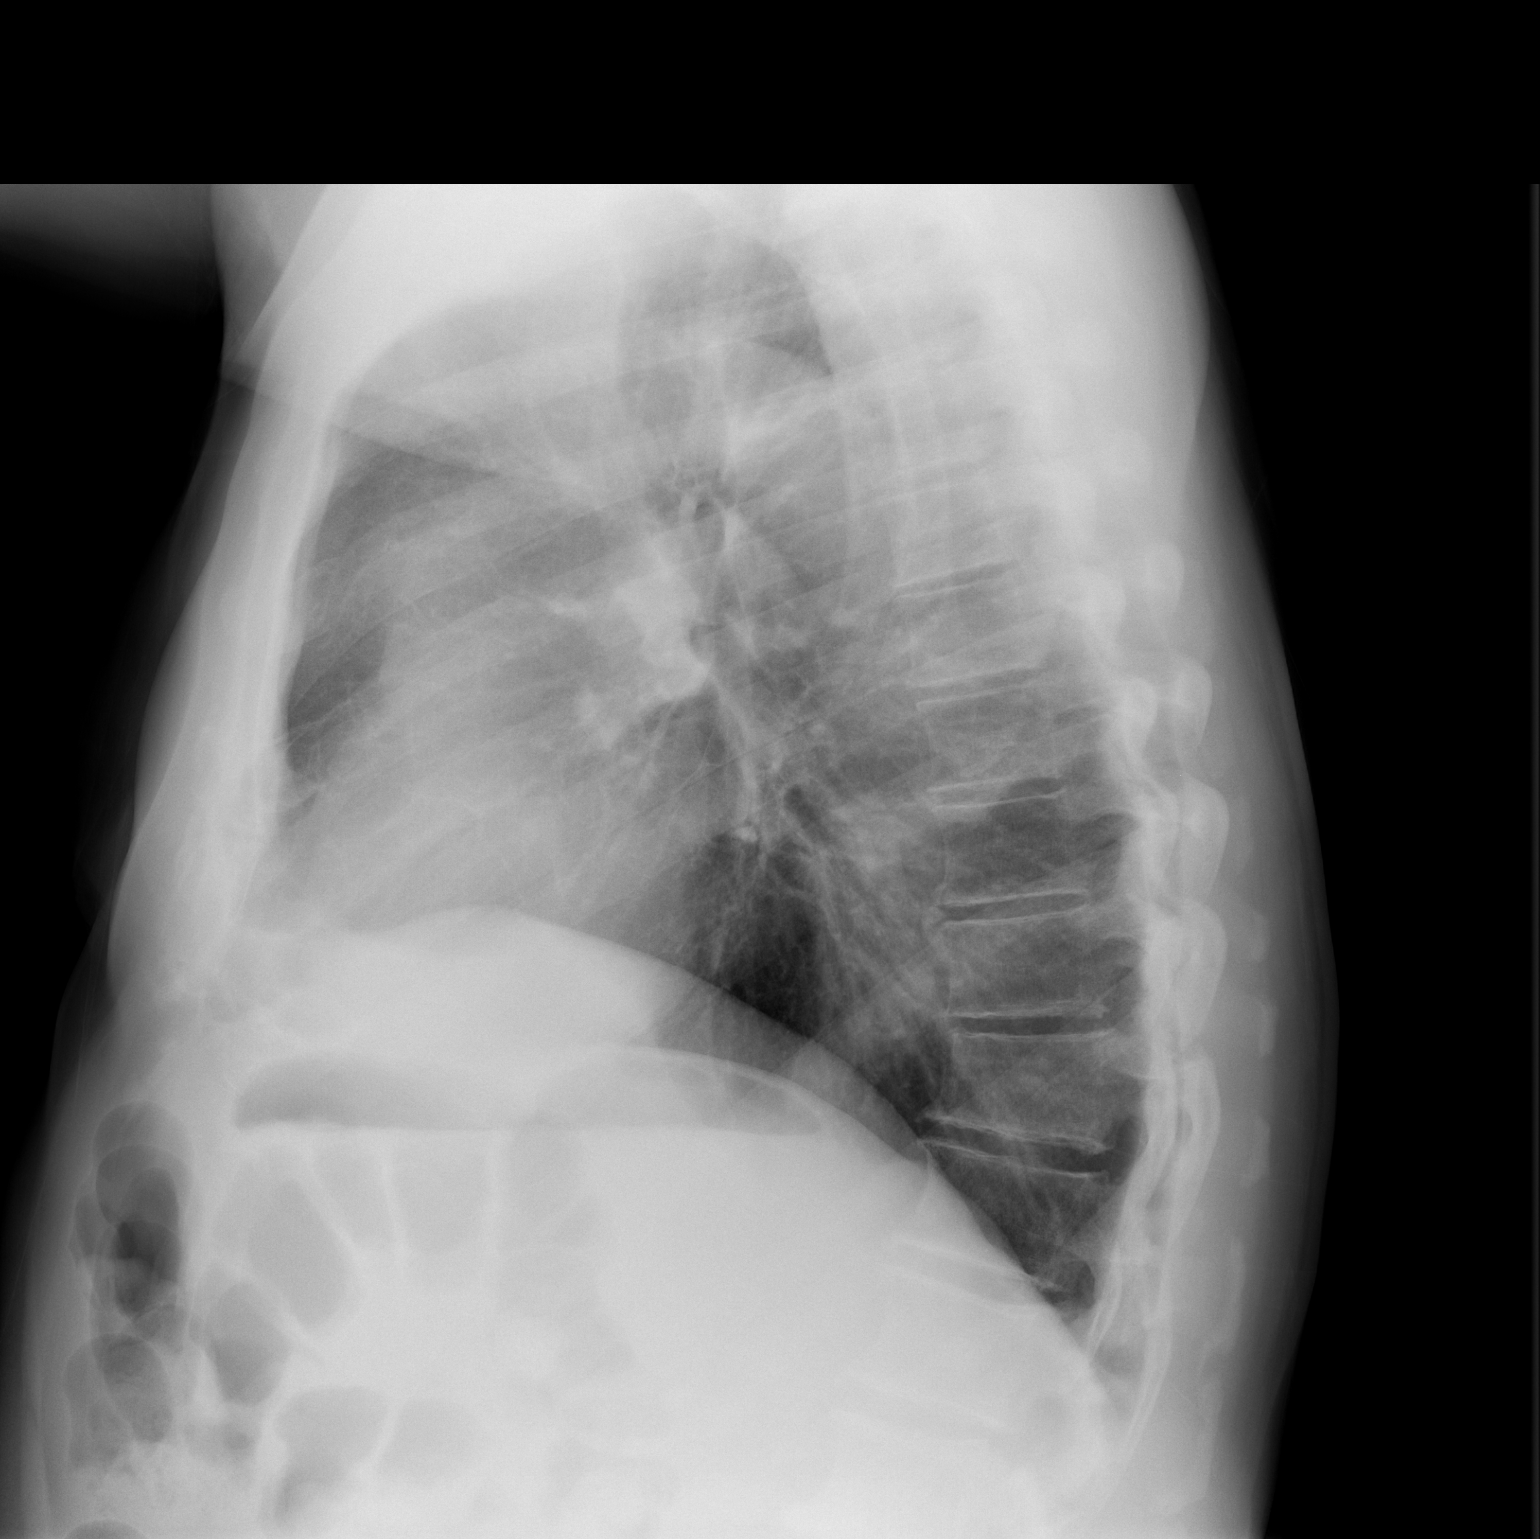

[2 of 2 positions shown; findings below may reference images not displayed]

FINDINGS: The heart size and mediastinal contours are within normal limits.
Both lungs are clear. No evidence of pleural effusion. No mass or
lymphadenopathy identified. Cervical spine fusion hardware again
noted.
IMPRESSION: Stable exam.  No active cardiopulmonary disease.

## 2016-11-05 ENCOUNTER — Ambulatory Visit: Payer: Medicare Other | Admitting: Internal Medicine

## 2017-03-10 NOTE — Progress Notes (Signed)
Subjective:    Patient ID: Chad Norris, male    DOB: 04/05/1932, 83 y.o.   MRN: [id number]  HPI He is here to establish with a new pcp.    He is here for a physical exam.    He plays golf twice a week.  He takes advil prior for some back pain.    He has no questions or concerns.     Medications and allergies reviewed with patient and updated if appropriate.  Patient Active Problem List   Diagnosis Date Noted  . Basal cell carcinoma (BCC) of [place] 03/11/2017  . DDD (degenerative disc disease), lumbosacral 01/13/2014  . HYPERLIPIDEMIA 06/21/2008  . NONSPECIFIC ABNORMAL ELECTROCARDIOGRAM 06/21/2008  . PROSTATE CANCER, HX OF 06/21/2008  . COLONIC POLYPS, HX OF 06/21/2008  . DIVERTICULOSIS, COLON 05/07/2008  . NEPHROLITHIASIS, HX OF 05/07/2008  . History of gout 10/09/2007    Current Outpatient Prescriptions on File Prior to Visit  Medication Sig Dispense Refill  . ibuprofen (ADVIL,MOTRIN) 200 MG tablet Take 400 mg by mouth every 6 (six) hours as needed for moderate pain.    . Multiple Vitamins-Minerals (ADULT ONE DAILY GUMMIES) CHEW Chew 2 each by mouth daily.     No current facility-administered medications on file prior to visit.     Past Medical History:  Diagnosis Date  . Colon polyps   . Diverticulosis   . Gout   . Hemorrhoids   . Hyperlipemia   . Nephrolithiasis   . Prostate cancer [facility])     Past Surgical History:  Procedure Laterality Date  . CATARACT EXTRACTION W/ INTRAOCULAR LENS  IMPLANT, BILATERAL     bi-lat  . CERVICAL SPINE SURGERY    . COLONOSCOPY W/ POLYPECTOMY    . Epidural steroid injections     3-4, Dr Ellene Route  . RETROPUBIC PROSTATECTOMY    . SHOULDER SURGERY     left    Social History   Social History  . Marital status: Married    Spouse name: N/A  . Number of children: 2  . Years of education: N/A   Occupational History  . retired    Social History Main Topics  . Smoking status: Never Smoker  . Smokeless tobacco: Never  Used  . Alcohol use 0.6 oz/week    1 Cans of beer per week  . Drug use: No  . Sexual activity: Not Asked   Other Topics Concern  . None   Social History Narrative  . None    Family History  Problem Relation Age of Onset  . Kidney cancer Father   . Colon cancer Father   . Cancer Brother        ?  [name] Heart attack Brother 55  . Heart attack Sister 43  . Breast cancer Sister   . Diabetes Brother   . Alcohol abuse Brother   . Colon polyps Brother     Review of Systems  Constitutional: Negative for chills and fever.  HENT: Positive for hearing loss (wears hearing aids).   Eyes: Negative for visual disturbance.  Respiratory: Negative for cough, shortness of breath and wheezing.   Cardiovascular: Negative for chest pain, palpitations and leg swelling.  Gastrointestinal: Negative for abdominal pain, blood in stool, constipation, diarrhea and nausea.  Genitourinary: Negative for dysuria and hematuria.  Musculoskeletal: Positive for back pain (chronic, intermittent).  Neurological: Negative for light-headedness and headaches.  Psychiatric/Behavioral: Negative for dysphoric mood. The patient is not nervous/anxious.  Objective:   Vitals:   03/11/17 1004  BP: 134/76  Pulse: (!) 55  Resp: 16  Temp: 98 F (36.7 C)  SpO2: 98%   Filed Weights   03/11/17 1004  Weight: 219 lb (99.3 kg)   Body mass index is 29.7 kg/m.  Wt Readings from Last 3 Encounters:  03/11/17 219 lb (99.3 kg)  03/23/16 219 lb (99.3 kg)  05/26/15 212 lb (96.2 kg)     Physical Exam Constitutional: He appears well-developed and well-nourished. No distress.  HENT:  Head: Normocephalic and atraumatic.  Right Ear: External ear normal.  Left Ear: External ear normal.  Mouth/Throat: Oropharynx is clear and moist.  Eyes: Conjunctivae and EOM are normal.  Neck: Neck supple. No tracheal deviation present. No thyromegaly present.  No carotid bruit  Cardiovascular: Normal rate, regular rhythm,  normal heart sounds and intact distal pulses.   No murmur heard. Pulmonary/Chest: Effort normal and breath sounds normal. No respiratory distress. He has no wheezes. He has no rales.  Abdominal: Soft. He exhibits no distension. There is no tenderness.  Musculoskeletal: He exhibits no edema.  Lymphadenopathy:   He has no cervical adenopathy.  Skin: Skin is warm and dry. He is not diaphoretic.  Psychiatric: He has a normal mood and affect. His behavior is normal.         Assessment & Plan:   Physical exam: Screening blood work ordered Immunizations  Discussed shingrix, other up to date Colonoscopy  No longer needed due to age Eye exams  Up to date  EKG last done 03/2016 Exercise  Plays golf twice a week Weight   Good for age Skin   No concerns Substance abuse   none  See Problem List for Assessment and Plan of chronic medical problems.    Follow up annually

## 2017-03-11 ENCOUNTER — Other Ambulatory Visit (INDEPENDENT_AMBULATORY_CARE_PROVIDER_SITE_OTHER): Payer: Medicare Other

## 2017-03-11 ENCOUNTER — Ambulatory Visit (INDEPENDENT_AMBULATORY_CARE_PROVIDER_SITE_OTHER): Payer: Medicare Other | Admitting: Internal Medicine

## 2017-03-11 ENCOUNTER — Encounter: Payer: Self-pay | Admitting: Internal Medicine

## 2017-03-11 VITALS — BP 134/76 | HR 55 | Temp 98.0°F | Resp 16 | Ht 72.0 in | Wt 219.0 lb

## 2017-03-11 DIAGNOSIS — Z23 Encounter for immunization: Secondary | ICD-10-CM | POA: Diagnosis not present

## 2017-03-11 DIAGNOSIS — Z8546 Personal history of malignant neoplasm of prostate: Secondary | ICD-10-CM | POA: Diagnosis not present

## 2017-03-11 DIAGNOSIS — Z8739 Personal history of other diseases of the musculoskeletal system and connective tissue: Secondary | ICD-10-CM

## 2017-03-11 DIAGNOSIS — C44311 Basal cell carcinoma of skin of nose: Secondary | ICD-10-CM

## 2017-03-11 DIAGNOSIS — Z0000 Encounter for general adult medical examination without abnormal findings: Secondary | ICD-10-CM | POA: Diagnosis not present

## 2017-03-11 DIAGNOSIS — Z85828 Personal history of other malignant neoplasm of skin: Secondary | ICD-10-CM | POA: Insufficient documentation

## 2017-03-11 DIAGNOSIS — R7303 Prediabetes: Secondary | ICD-10-CM | POA: Insufficient documentation

## 2017-03-11 DIAGNOSIS — M5137 Other intervertebral disc degeneration, lumbosacral region: Secondary | ICD-10-CM

## 2017-03-11 LAB — LIPID PANEL
Cholesterol: 160 mg/dL (ref 0–200)
HDL: 35.8 mg/dL — ABNORMAL LOW (ref >39.00)
LDL Cholesterol: 86 mg/dL (ref 0–99)
NonHDL: 123.91
Total CHOL/HDL Ratio: 4
Triglycerides: 190 mg/dL — ABNORMAL HIGH (ref 0.0–149.0)
VLDL: 38 mg/dL (ref 0.0–40.0)

## 2017-03-11 LAB — COMPREHENSIVE METABOLIC PANEL
ALT: 26 U/L (ref 0–53)
AST: 20 U/L (ref 0–37)
Albumin: 4.1 g/dL (ref 3.5–5.2)
Alkaline Phosphatase: 38 U/L — ABNORMAL LOW (ref 39–117)
BUN: 17 mg/dL (ref 6–23)
CO2: 26 mEq/L (ref 19–32)
Calcium: 9.4 mg/dL (ref 8.4–10.5)
Chloride: 106 mEq/L (ref 96–112)
Creatinine, Ser: 1.16 mg/dL (ref 0.40–1.50)
GFR: 63.87 mL/min (ref >60.00)
Glucose, Bld: 94 mg/dL (ref 70–99)
Potassium: 4.4 mEq/L (ref 3.5–5.1)
Sodium: 138 mEq/L (ref 135–145)
Total Bilirubin: 0.7 mg/dL (ref 0.2–1.2)
Total Protein: 7.6 g/dL (ref 6.0–8.3)

## 2017-03-11 LAB — CBC WITH DIFFERENTIAL/PLATELET
Basophils Absolute: 0.1 10*3/uL (ref 0.0–0.1)
Basophils Relative: 1.2 % (ref 0.0–3.0)
Eosinophils Absolute: 0.2 10*3/uL (ref 0.0–0.7)
Eosinophils Relative: 3.6 % (ref 0.0–5.0)
HCT: 44.6 % (ref 39.0–52.0)
Hemoglobin: 15.2 g/dL (ref 13.0–17.0)
Lymphocytes Relative: 45.4 % (ref 12.0–46.0)
Lymphs Abs: 2.8 10*3/uL (ref 0.7–4.0)
MCHC: 34.2 g/dL (ref 30.0–36.0)
MCV: 94.6 fl (ref 78.0–100.0)
Monocytes Absolute: 0.6 10*3/uL (ref 0.1–1.0)
Monocytes Relative: 9.9 % (ref 3.0–12.0)
Neutro Abs: 2.5 10*3/uL (ref 1.4–7.7)
Neutrophils Relative %: 39.9 % — ABNORMAL LOW (ref 43.0–77.0)
Platelets: 186 10*3/uL (ref 150.0–400.0)
RBC: 4.72 Mil/uL (ref 4.22–5.81)
RDW: 12.8 % (ref 11.5–15.5)
WBC: 6.2 10*3/uL (ref 4.0–10.5)

## 2017-03-11 LAB — TSH: TSH: 1.77 u[IU]/mL (ref 0.35–4.50)

## 2017-03-11 LAB — HEMOGLOBIN A1C: Hgb A1c MFr Bld: 6 % (ref 4.6–6.5)

## 2017-03-11 MED ORDER — ZOSTER VAC RECOMB ADJUVANTED 50 MCG/0.5ML IM SUSR
0.5000 mL | Freq: Once | INTRAMUSCULAR | 1 refills | Status: AC
Start: 2017-03-11 — End: 2017-03-11

## 2017-03-11 NOTE — Patient Instructions (Addendum)
Test(s) ordered today. Your results will be released to MyChart (or called to you) after review, usually within 72hours after test completion. If any changes need to be made, you will be notified at that same time.  All other Health Maintenance issues reviewed.   All recommended immunizations and age-appropriate screenings are up-to-date or discussed.  No immunizations administered today.   Medications reviewed and updated.  No changes recommended at this time.   Please followup in one year    Health Maintenance, Male A healthy lifestyle and preventive care is important for your health and wellness. Ask your health care provider about what schedule of regular examinations is right for you. What should I know about weight and diet? Eat a Healthy Diet  Eat plenty of vegetables, fruits, whole grains, low-fat dairy products, and lean protein.  Do not eat a lot of foods high in solid fats, added sugars, or salt.  Maintain a Healthy Weight Regular exercise can help you achieve or maintain a healthy weight. You should:  Do at least 150 minutes of exercise each week. The exercise should increase your heart rate and make you sweat (moderate-intensity exercise).  Do strength-training exercises at least twice a week.  Watch Your Levels of Cholesterol and Blood Lipids  Have your blood tested for lipids and cholesterol every 5 years starting at 81 years of age. If you are at high risk for heart disease, you should start having your blood tested when you are 81 years old. You may need to have your cholesterol levels checked more often if: ? Your lipid or cholesterol levels are high. ? You are older than 81 years of age. ? You are at high risk for heart disease.  What should I know about cancer screening? Many types of cancers can be detected early and may often be prevented. Lung Cancer  You should be screened every year for lung cancer if: ? You are a current smoker who has smoked for  at least 30 years. ? You are a former smoker who has quit within the past 15 years.  Talk to your health care provider about your screening options, when you should start screening, and how often you should be screened.  Colorectal Cancer  Routine colorectal cancer screening usually begins at 81 years of age and should be repeated every 5-10 years until you are 81 years old. You may need to be screened more often if early forms of precancerous polyps or small growths are found. Your health care provider may recommend screening at an earlier age if you have risk factors for colon cancer.  Your health care provider may recommend using home test kits to check for hidden blood in the stool.  A small camera at the end of a tube can be used to examine your colon (sigmoidoscopy or colonoscopy). This checks for the earliest forms of colorectal cancer.  Prostate and Testicular Cancer  Depending on your age and overall health, your health care provider may do certain tests to screen for prostate and testicular cancer.  Talk to your health care provider about any symptoms or concerns you have about testicular or prostate cancer.  Skin Cancer  Check your skin from head to toe regularly.  Tell your health care provider about any new moles or changes in moles, especially if: ? There is a change in a mole's size, shape, or color. ? You have a mole that is larger than a pencil eraser.  Always use sunscreen. Apply sunscreen liberally   and repeat throughout the day.  Protect yourself by wearing long sleeves, pants, a wide-brimmed hat, and sunglasses when outside.  What should I know about heart disease, diabetes, and high blood pressure?  If you are 18-39 years of age, have your blood pressure checked every 3-5 years. If you are 40 years of age or older, have your blood pressure checked every year. You should have your blood pressure measured twice-once when you are at a hospital or clinic, and once  when you are not at a hospital or clinic. Record the average of the two measurements. To check your blood pressure when you are not at a hospital or clinic, you can use: ? An automated blood pressure machine at a pharmacy. ? A home blood pressure monitor.  Talk to your health care provider about your target blood pressure.  If you are between 45-79 years old, ask your health care provider if you should take aspirin to prevent heart disease.  Have regular diabetes screenings by checking your fasting blood sugar level. ? If you are at a normal weight and have a low risk for diabetes, have this test once every three years after the age of 45. ? If you are overweight and have a high risk for diabetes, consider being tested at a younger age or more often.  A one-time screening for abdominal aortic aneurysm (AAA) by ultrasound is recommended for men aged 65-75 years who are current or former smokers. What should I know about preventing infection? Hepatitis B If you have a higher risk for hepatitis B, you should be screened for this virus. Talk with your health care provider to find out if you are at risk for hepatitis B infection. Hepatitis C Blood testing is recommended for:  Everyone born from 1945 through 1965.  Anyone with known risk factors for hepatitis C.  Sexually Transmitted Diseases (STDs)  You should be screened each year for STDs including gonorrhea and chlamydia if: ? You are sexually active and are younger than 81 years of age. ? You are older than 81 years of age and your health care provider tells you that you are at risk for this type of infection. ? Your sexual activity has changed since you were last screened and you are at an increased risk for chlamydia or gonorrhea. Ask your health care provider if you are at risk.  Talk with your health care provider about whether you are at high risk of being infected with HIV. Your health care provider may recommend a prescription  medicine to help prevent HIV infection.  What else can I do?  Schedule regular health, dental, and eye exams.  Stay current with your vaccines (immunizations).  Do not use any tobacco products, such as cigarettes, chewing tobacco, and e-cigarettes. If you need help quitting, ask your health care provider.  Limit alcohol intake to no more than 2 drinks per day. One drink equals 12 ounces of beer, 5 ounces of wine, or 1 ounces of hard liquor.  Do not use street drugs.  Do not share needles.  Ask your health care provider for help if you need support or information about quitting drugs.  Tell your health care provider if you often feel depressed.  Tell your health care provider if you have ever been abused or do not feel safe at home. This information is not intended to replace advice given to you by your health care provider. Make sure you discuss any questions you have with your health   care provider. Document Released: 01/05/2008 Document Revised: 03/07/2016 Document Reviewed: 04/12/2015 Elsevier Interactive Patient Education  2018 [employer]  

## 2017-03-11 NOTE — Assessment & Plan Note (Signed)
Takes advil as needed - usually prior to playing golf

## 2017-03-11 NOTE — Assessment & Plan Note (Signed)
Avoid certain foods - controlled

## 2017-03-11 NOTE — Assessment & Plan Note (Addendum)
Cancer free since [facility] annually

## 2017-03-11 NOTE — Assessment & Plan Note (Signed)
Sees dermatology.

## 2017-05-23 DIAGNOSIS — C44319 Basal cell carcinoma of skin of other parts of face: Secondary | ICD-10-CM | POA: Diagnosis not present

## 2017-05-23 DIAGNOSIS — B078 Other viral warts: Secondary | ICD-10-CM | POA: Diagnosis not present

## 2017-05-23 DIAGNOSIS — L821 Other seborrheic keratosis: Secondary | ICD-10-CM | POA: Diagnosis not present

## 2017-05-23 DIAGNOSIS — D2239 Melanocytic nevi of other parts of face: Secondary | ICD-10-CM | POA: Diagnosis not present

## 2017-05-23 DIAGNOSIS — Z85828 Personal history of other malignant neoplasm of skin: Secondary | ICD-10-CM | POA: Diagnosis not present

## 2017-06-20 DIAGNOSIS — C44319 Basal cell carcinoma of skin of other parts of face: Secondary | ICD-10-CM | POA: Diagnosis not present

## 2017-11-19 ENCOUNTER — Ambulatory Visit (INDEPENDENT_AMBULATORY_CARE_PROVIDER_SITE_OTHER): Payer: Medicare Other | Admitting: Nurse Practitioner

## 2017-11-19 ENCOUNTER — Encounter: Payer: Self-pay | Admitting: Nurse Practitioner

## 2017-11-19 ENCOUNTER — Telehealth: Payer: Self-pay | Admitting: Internal Medicine

## 2017-11-19 VITALS — BP 120/72 | HR 58 | Temp 98.6°F | Ht 72.0 in | Wt 216.0 lb

## 2017-11-19 DIAGNOSIS — T161XXA Foreign body in right ear, initial encounter: Secondary | ICD-10-CM

## 2017-11-19 NOTE — Progress Notes (Signed)
Subjective:  Patient ID: Chad Norris, male    DOB: 01-24-1932  Age: 84 y.o. MRN: [id number]  CC: Ear Pain (rght ear painful to touch/1 day)   Foreign Body in Ear  The incident occurred 12 to 24 hours ago. Suspected object: piece of hearing aid. The foreign body is suspected to be in the right ear. The incident was suspected. The incident was witnessed/reported by the patient. Associated symptoms include hearing loss. Pertinent negatives include no drainage, fever or nosebleeds. His past medical history is significant for a prior foreign body removal. There is no history of esophageal disease or mental retardation.    Outpatient Medications Prior to Visit  Medication Sig Dispense Refill  . ibuprofen (ADVIL,MOTRIN) 200 MG tablet Take 400 mg by mouth every 6 (six) hours as needed for moderate pain.    . Multiple Vitamins-Minerals (ADULT ONE DAILY GUMMIES) CHEW Chew 2 each by mouth daily.     No facility-administered medications prior to visit.     ROS See HPI  Objective:  BP 120/72   Pulse (!) 58   Temp 98.6 F (37 C) (Oral)   Ht 6' (1.829 m)   Wt 216 lb (98 kg)   SpO2 95%   BMI 29.29 kg/m   BP Readings from Last 3 Encounters:  11/19/17 120/72  03/11/17 134/76  03/23/16 138/94    Wt Readings from Last 3 Encounters:  11/19/17 216 lb (98 kg)  03/11/17 219 lb (99.3 kg)  03/23/16 219 lb (99.3 kg)    Physical Exam  Constitutional: No distress.  HENT:  Right Ear: Tympanic membrane, external ear and ear canal normal. Tympanic membrane is not injected, not perforated and not erythematous. Decreased hearing is noted.  Left Ear: Tympanic membrane, external ear and ear canal normal. Tympanic membrane is not injected, not perforated and not erythematous. Decreased hearing is noted.  Nose: Nose normal.  Removed small soft plastic piece from right ear canal with use of tweezers. Procedure well tolerated. No complication.  Cardiovascular: Normal rate.  Vitals reviewed.   Lab  Results  Component Value Date   WBC 6.2 03/11/2017   HGB 15.2 03/11/2017   HCT 44.6 03/11/2017   PLT 186.0 03/11/2017   GLUCOSE 94 03/11/2017   CHOL 160 03/11/2017   TRIG 190.0 (H) 03/11/2017   HDL 35.80 (L) 03/11/2017   LDLCALC 86 03/11/2017   ALT 26 03/11/2017   AST 20 03/11/2017   NA 138 03/11/2017   K 4.4 03/11/2017   CL 106 03/11/2017   CREATININE 1.16 03/11/2017   BUN 17 03/11/2017   CO2 26 03/11/2017   TSH 1.77 03/11/2017   PSA 0.01 (L) 01/13/2014   HGBA1C 6.0 03/11/2017    Mr Thoracic Spine W Wo Contrast  Result Date: 03/23/2016 CLINICAL DATA:  RIGHT leg numbness beginning 1 week ago while playing golf. Low back pain for a few days. LEFT chest pain/soreness. History of prostate cancer. EXAM: MRI THORACIC AND LUMBAR SPINE WITHOUT AND WITH CONTRAST TECHNIQUE: Multiplanar and multiecho pulse sequences of the thoracic and lumbar spine were obtained without and with intravenous contrast. CONTRAST:  58mL MULTIHANCE GADOBENATE DIMEGLUMINE 529 MG/ML IV SOLN COMPARISON:  None. FINDINGS: MRI THORACIC SPINE FINDINGS ALIGNMENT: Maintenance of the thoracic kyphosis. No malalignment. VERTEBRAE/DISCS: Status post C5 through C7 ACDF. Vertebral bodies are intact. Intervertebral discs morphology and signal are normal. No abnormal bone marrow signal to suggest acute osseous process. No abnormal osseous or intradiscal enhancement. Scattered old Schmorl's node. Mild enhancing acute T10 inferior  endplate Schmorl's node. CORD: Thoracic spinal cord is normal morphology and signal characteristics ; conus medullaris not included. No abnormal cord or leptomeningeal enhancement. PREVERTEBRAL AND PARASPINAL SOFT TISSUES: Normal. 6 mm LEFT T10 perineural cyst. DISC LEVELS: No significant disc bulge, canal stenosis or neural foraminal narrowing any thoracic level. MRI LUMBAR SPINE FINDINGS SEGMENTATION: For the purposes of this report, the last well-formed intervertebral disc will be described as L5-S1. ALIGNMENT:  No malalignment.  Maintenance of the lumbar lordosis. VERTEBRAE:Lumbar vertebral bodies intact. Intervertebral discs demonstrate normal morphology, mild desiccation. Faint central mineralization L2-3. Moderate subacute on chronic discogenic endplate changes H6-3. Mild acute on chronic discogenic endplate changes J4-9. No suspicious bone marrow signal to suggest fracture. No abnormal osseous or intradiscal enhancement. CONUS MEDULLARIS: Conus medullaris terminates at L1-2 and demonstrates normal morphology and signal characteristics. Cauda equina is normal. PARASPINAL AND SOFT TISSUES: Included prevertebral and paraspinal soft tissues are normal. DISC LEVELS: T12-L1, L1-2: No disc bulge, canal stenosis nor neural foraminal narrowing. L2-3: Small broad-based disc bulge. Mild canal stenosis including partial effacement of the RIGHT lateral recess which could affect the traversing RIGHT L3 nerve. No neural foraminal narrowing. L3-4: Small broad-based disc bulge. Mild facet arthropathy and ligamentum flavum redundancy. Mild canal stenosis with partial lateral recess effacement which could affect the traversing L4 nerves. Mild bilateral caudal neural foraminal narrowing. L4-5: Small central disc protrusion enhancing annular fissure. Mild facet arthropathy and ligamentum flavum redundancy. Mild canal stenosis. Minimal LEFT neural foraminal narrowing. L5-S1: Small central disc protrusion and enhancing annular fissure. Mild facet arthropathy and ligamentum flavum redundancy without canal stenosis. No significant neural foraminal narrowing. IMPRESSION: MRI THORACIC SPINE: Early degenerative change. No acute fracture or malalignment. No canal stenosis or neural foraminal narrowing. MRI LUMBAR SPINE: Small disc protrusions and annular fissures. No fracture or malalignment. Mild canal stenosis L2-3 through L4-5. Minimal to mild L3-4 and L4-5 neural foraminal narrowing. Electronically Signed   By: Elon Alas M.D.   On:  03/23/2016 20:21   Mr Lumbar Spine W Wo Contrast  Result Date: 03/23/2016 CLINICAL DATA:  RIGHT leg numbness beginning 1 week ago while playing golf. Low back pain for a few days. LEFT chest pain/soreness. History of prostate cancer. EXAM: MRI THORACIC AND LUMBAR SPINE WITHOUT AND WITH CONTRAST TECHNIQUE: Multiplanar and multiecho pulse sequences of the thoracic and lumbar spine were obtained without and with intravenous contrast. CONTRAST:  37mL MULTIHANCE GADOBENATE DIMEGLUMINE 529 MG/ML IV SOLN COMPARISON:  None. FINDINGS: MRI THORACIC SPINE FINDINGS ALIGNMENT: Maintenance of the thoracic kyphosis. No malalignment. VERTEBRAE/DISCS: Status post C5 through C7 ACDF. Vertebral bodies are intact. Intervertebral discs morphology and signal are normal. No abnormal bone marrow signal to suggest acute osseous process. No abnormal osseous or intradiscal enhancement. Scattered old Schmorl's node. Mild enhancing acute T10 inferior endplate Schmorl's node. CORD: Thoracic spinal cord is normal morphology and signal characteristics ; conus medullaris not included. No abnormal cord or leptomeningeal enhancement. PREVERTEBRAL AND PARASPINAL SOFT TISSUES: Normal. 6 mm LEFT T10 perineural cyst. DISC LEVELS: No significant disc bulge, canal stenosis or neural foraminal narrowing any thoracic level. MRI LUMBAR SPINE FINDINGS SEGMENTATION: For the purposes of this report, the last well-formed intervertebral disc will be described as L5-S1. ALIGNMENT: No malalignment.  Maintenance of the lumbar lordosis. VERTEBRAE:Lumbar vertebral bodies intact. Intervertebral discs demonstrate normal morphology, mild desiccation. Faint central mineralization L2-3. Moderate subacute on chronic discogenic endplate changes F0-2. Mild acute on chronic discogenic endplate changes O3-7. No suspicious bone marrow signal to suggest fracture. No abnormal osseous  or intradiscal enhancement. CONUS MEDULLARIS: Conus medullaris terminates at L1-2 and  demonstrates normal morphology and signal characteristics. Cauda equina is normal. PARASPINAL AND SOFT TISSUES: Included prevertebral and paraspinal soft tissues are normal. DISC LEVELS: T12-L1, L1-2: No disc bulge, canal stenosis nor neural foraminal narrowing. L2-3: Small broad-based disc bulge. Mild canal stenosis including partial effacement of the RIGHT lateral recess which could affect the traversing RIGHT L3 nerve. No neural foraminal narrowing. L3-4: Small broad-based disc bulge. Mild facet arthropathy and ligamentum flavum redundancy. Mild canal stenosis with partial lateral recess effacement which could affect the traversing L4 nerves. Mild bilateral caudal neural foraminal narrowing. L4-5: Small central disc protrusion enhancing annular fissure. Mild facet arthropathy and ligamentum flavum redundancy. Mild canal stenosis. Minimal LEFT neural foraminal narrowing. L5-S1: Small central disc protrusion and enhancing annular fissure. Mild facet arthropathy and ligamentum flavum redundancy without canal stenosis. No significant neural foraminal narrowing. IMPRESSION: MRI THORACIC SPINE: Early degenerative change. No acute fracture or malalignment. No canal stenosis or neural foraminal narrowing. MRI LUMBAR SPINE: Small disc protrusions and annular fissures. No fracture or malalignment. Mild canal stenosis L2-3 through L4-5. Minimal to mild L3-4 and L4-5 neural foraminal narrowing. Electronically Signed   By: Elon Alas M.D.   On: 03/23/2016 20:21    Assessment & Plan:   Chad Norris was seen today for ear pain.  Diagnoses and all orders for this visit:  Foreign body of right ear, initial encounter   I am having Chad Norris maintain his ibuprofen and ADULT ONE DAILY GUMMIES.  No orders of the defined types were placed in this encounter.   Follow-up: No follow-ups on file.  Wilfred Lacy, NP

## 2017-11-19 NOTE — Telephone Encounter (Signed)
Mora Bellman discussed transfer of care from Dr. Quay Burow to Dr, Zigmund Daniel when patient walked in.

## 2017-11-19 NOTE — Telephone Encounter (Signed)
Ok with me 

## 2017-11-20 NOTE — Telephone Encounter (Signed)
Ok with me 

## 2018-03-27 ENCOUNTER — Encounter: Payer: Self-pay | Admitting: Family Medicine

## 2018-03-27 ENCOUNTER — Ambulatory Visit (INDEPENDENT_AMBULATORY_CARE_PROVIDER_SITE_OTHER): Payer: Medicare Other | Admitting: Family Medicine

## 2018-03-27 VITALS — BP 140/82 | HR 58 | Temp 98.2°F | Ht 71.5 in | Wt 220.0 lb

## 2018-03-27 DIAGNOSIS — E7800 Pure hypercholesterolemia, unspecified: Secondary | ICD-10-CM | POA: Insufficient documentation

## 2018-03-27 DIAGNOSIS — E785 Hyperlipidemia, unspecified: Secondary | ICD-10-CM

## 2018-03-27 DIAGNOSIS — Z0000 Encounter for general adult medical examination without abnormal findings: Secondary | ICD-10-CM | POA: Insufficient documentation

## 2018-03-27 LAB — CBC
HCT: 42.5 % (ref 39.0–52.0)
Hemoglobin: 14.7 g/dL (ref 13.0–17.0)
MCHC: 34.7 g/dL (ref 30.0–36.0)
MCV: 93.1 fl (ref 78.0–100.0)
Platelets: 172 10*3/uL (ref 150.0–400.0)
RBC: 4.56 Mil/uL (ref 4.22–5.81)
RDW: 13.1 % (ref 11.5–15.5)
WBC: 5.3 10*3/uL (ref 4.0–10.5)

## 2018-03-27 LAB — COMPREHENSIVE METABOLIC PANEL
ALT: 27 U/L (ref 0–53)
AST: 23 U/L (ref 0–37)
Albumin: 4.3 g/dL (ref 3.5–5.2)
Alkaline Phosphatase: 39 U/L (ref 39–117)
BUN: 21 mg/dL (ref 6–23)
CO2: 29 mEq/L (ref 19–32)
Calcium: 8.9 mg/dL (ref 8.4–10.5)
Chloride: 105 mEq/L (ref 96–112)
Creatinine, Ser: 1.29 mg/dL (ref 0.40–1.50)
GFR: 56.36 mL/min — ABNORMAL LOW (ref >60.00)
Glucose, Bld: 123 mg/dL — ABNORMAL HIGH (ref 70–99)
Potassium: 4.2 mEq/L (ref 3.5–5.1)
Sodium: 139 mEq/L (ref 135–145)
Total Bilirubin: 0.9 mg/dL (ref 0.2–1.2)
Total Protein: 6.8 g/dL (ref 6.0–8.3)

## 2018-03-27 LAB — LIPID PANEL
Cholesterol: 174 mg/dL (ref 0–200)
HDL: 31.5 mg/dL — ABNORMAL LOW (ref >39.00)
NonHDL: 142.47
Total CHOL/HDL Ratio: 6
Triglycerides: 218 mg/dL — ABNORMAL HIGH (ref 0.0–149.0)
VLDL: 43.6 mg/dL — ABNORMAL HIGH (ref 0.0–40.0)

## 2018-03-27 LAB — TSH: TSH: 1.95 u[IU]/mL (ref 0.35–4.50)

## 2018-03-27 LAB — VITAMIN D 25 HYDROXY (VIT D DEFICIENCY, FRACTURES): VITD: 28.5 ng/mL — ABNORMAL LOW (ref 30.00–100.00)

## 2018-03-27 LAB — LDL CHOLESTEROL, DIRECT: Direct LDL: 122 mg/dL

## 2018-03-27 NOTE — Assessment & Plan Note (Signed)
Well adult Orders Placed This Encounter  Procedures  . Comp Met (CMET)  . CBC  . Lipid Profile  . TSH  . Vitamin D (25 hydroxy)  Immunizations: We will contact once we have high dose vaccine available.  He will obtain shingrix through pharmacy.  Screenings:  Up to date Anticipatory guidance/Risk factor reduction:  Recommendations per AVS.

## 2018-03-27 NOTE — Progress Notes (Signed)
Daril Warga - 84 y.o. male MRN [id number]  Date of birth: 22-Aug-1931  Subjective Chief Complaint  Patient presents with  . Annual Exam    HPI Kyan Yurkovich is a 84 y.o. male with history of HLD, prostate cancer s/p prostatectomy, and gout here today for annual exam.  He remains fairly active for his age, playing golf regularly.  He does have some mild back pain while playing golf and takes ibuprofen as needed for this.  He continues to follow with urology as well.  He has no additional concerns today.  He is interested in Shingrix and flu vaccine.   Review of Systems  Constitutional: Negative for chills, fever, malaise/fatigue and weight loss.  HENT: Negative for congestion, ear pain and sore throat.   Eyes: Negative for blurred vision, double vision and pain.  Respiratory: Negative for cough and shortness of breath.   Cardiovascular: Negative for chest pain and palpitations.  Gastrointestinal: Negative for abdominal pain, blood in stool, constipation, heartburn and nausea.  Genitourinary: Negative for dysuria and urgency.  Musculoskeletal: Negative for joint pain and myalgias.  Neurological: Negative for dizziness and headaches.  Endo/Heme/Allergies: Does not bruise/bleed easily.  Psychiatric/Behavioral: Negative for depression. The patient is not nervous/anxious and does not have insomnia.     No Known Allergies  Past Medical History:  Diagnosis Date  . Colon polyps   . Diverticulosis   . Gout   . Hemorrhoids   . Hyperlipemia   . Nephrolithiasis   . Prostate cancer [facility])     Past Surgical History:  Procedure Laterality Date  . CATARACT EXTRACTION W/ INTRAOCULAR LENS  IMPLANT, BILATERAL     bi-lat  . CERVICAL SPINE SURGERY    . COLONOSCOPY W/ POLYPECTOMY    . Epidural steroid injections     3-4, Dr Ellene Route  . RETROPUBIC PROSTATECTOMY    . SHOULDER SURGERY     left    Social History   Socioeconomic History  . Marital status: Married    Spouse name: Not on file    . Number of children: 2  . Years of education: Not on file  . Highest education level: Not on file  Occupational History  . Occupation: retired  Scientific laboratory technician  . Financial resource strain: Not on file  . Food insecurity:    Worry: Not on file    Inability: Not on file  . Transportation needs:    Medical: Not on file    Non-medical: Not on file  Tobacco Use  . Smoking status: Never Smoker  . Smokeless tobacco: Never Used  Substance and Sexual Activity  . Alcohol use: Yes    Alcohol/week: 1.0 standard drinks    Types: 1 Cans of beer per week  . Drug use: No  . Sexual activity: Not on file  Lifestyle  . Physical activity:    Days per week: Not on file    Minutes per session: Not on file  . Stress: Not on file  Relationships  . Social connections:    Talks on phone: Not on file    Gets together: Not on file    Attends religious service: Not on file    Active member of club or organization: Not on file    Attends meetings of clubs or organizations: Not on file    Relationship status: Not on file  Other Topics Concern  . Not on file  Social History Narrative  . Not on file    Family History  Problem Relation  Age of Onset  . Kidney cancer Father   . Colon cancer Father   . Cancer Brother        ?  [name] Heart attack Brother 81  . Heart attack Sister 42  . Breast cancer Sister   . Diabetes Brother   . Alcohol abuse Brother   . Colon polyps Brother     Health Maintenance  Topic Date Due  . TETANUS/TDAP  12/20/1952  . INFLUENZA VACCINE  02/20/2018  . PNA vac Low Risk Adult  Completed    ----------------------------------------------------------------------------------------------------------------------------------------------------------------------------------------------------------------- Physical Exam BP 140/82 (BP Location: Left Arm, Patient Position: Sitting, Cuff Size: Normal)   Pulse (!) 58   Temp 98.2 F (36.8 C) (Oral)   Ht 5' 11.5" (1.816 m)   Wt  220 lb (99.8 kg)   SpO2 98%   BMI 30.26 kg/m   Physical Exam  Constitutional: He is oriented to person, place, and time. He appears well-nourished. No distress.  HENT:  Head: Normocephalic and atraumatic.  Mouth/Throat: Oropharynx is clear and moist.  Eyes: No scleral icterus.  Neck: Neck supple. No thyromegaly present.  Cardiovascular: Normal rate, regular rhythm and normal heart sounds.  Pulmonary/Chest: Effort normal and breath sounds normal.  Abdominal: Soft. Bowel sounds are normal. He exhibits no distension. There is no tenderness.  Musculoskeletal: He exhibits no edema.  Lymphadenopathy:    He has no cervical adenopathy.  Neurological: He is alert and oriented to person, place, and time. No cranial nerve deficit.  Skin: Skin is warm and dry. No rash noted.  Psychiatric: He has a normal mood and affect. His behavior is normal.    ------------------------------------------------------------------------------------------------------------------------------------------------------------------------------------------------------------------- Assessment and Plan  Well adult exam Well adult Orders Placed This Encounter  Procedures  . Comp Met (CMET)  . CBC  . Lipid Profile  . TSH  . Vitamin D (25 hydroxy)  Immunizations: We will contact once we have high dose vaccine available.  He will obtain shingrix through pharmacy.  Screenings:  Up to date Anticipatory guidance/Risk factor reduction:  Recommendations per AVS.

## 2018-03-27 NOTE — Patient Instructions (Signed)

## 2018-04-17 ENCOUNTER — Telehealth: Payer: Self-pay | Admitting: Emergency Medicine

## 2018-04-17 DIAGNOSIS — L821 Other seborrheic keratosis: Secondary | ICD-10-CM | POA: Diagnosis not present

## 2018-04-17 DIAGNOSIS — Z85828 Personal history of other malignant neoplasm of skin: Secondary | ICD-10-CM | POA: Diagnosis not present

## 2018-04-17 DIAGNOSIS — L820 Inflamed seborrheic keratosis: Secondary | ICD-10-CM | POA: Diagnosis not present

## 2018-04-17 DIAGNOSIS — C44311 Basal cell carcinoma of skin of nose: Secondary | ICD-10-CM | POA: Diagnosis not present

## 2018-04-17 NOTE — Telephone Encounter (Signed)
Spoke with patients wife regarding scheduling an appointment for the high dose flu vaccine for both patients. Patients has been scheduled to come in 04/24/2018 at 10 am. Nothing further needed

## 2018-04-24 ENCOUNTER — Ambulatory Visit (INDEPENDENT_AMBULATORY_CARE_PROVIDER_SITE_OTHER): Payer: Medicare Other

## 2018-04-24 ENCOUNTER — Encounter: Payer: Self-pay | Admitting: Family Medicine

## 2018-04-24 DIAGNOSIS — Z23 Encounter for immunization: Secondary | ICD-10-CM | POA: Diagnosis not present

## 2018-04-24 NOTE — Progress Notes (Signed)
Patient came into the office to receive his flu vaccine. Patient received the vaccine in his right deltoid & tolerated the injection well. No signs/symptoms of a reaction prior to leaving the exam room. VIS given to patient.

## 2018-05-19 ENCOUNTER — Telehealth: Payer: Self-pay | Admitting: Family Medicine

## 2018-05-19 NOTE — Telephone Encounter (Signed)
Copied from [place] [id number]. Topic: Quick Communication - See Telephone Encounter >> May 19, 2018  2:31 PM Cecelia Byars, NT wrote: Patients wife called and would like return call at  [phone] with patients most recent lab results

## 2018-05-19 NOTE — Telephone Encounter (Signed)
Spoke with patient wife regarding lab results. Patient wife understood and had no further questions.

## 2018-05-26 DIAGNOSIS — C44311 Basal cell carcinoma of skin of nose: Secondary | ICD-10-CM | POA: Diagnosis not present

## 2018-06-05 DIAGNOSIS — Z4802 Encounter for removal of sutures: Secondary | ICD-10-CM | POA: Diagnosis not present

## 2018-06-25 ENCOUNTER — Ambulatory Visit (INDEPENDENT_AMBULATORY_CARE_PROVIDER_SITE_OTHER): Payer: Medicare Other | Admitting: Family Medicine

## 2018-06-25 ENCOUNTER — Encounter: Payer: Self-pay | Admitting: Family Medicine

## 2018-06-25 VITALS — BP 130/80 | HR 95 | Temp 98.0°F | Wt 217.8 lb

## 2018-06-25 DIAGNOSIS — J029 Acute pharyngitis, unspecified: Secondary | ICD-10-CM | POA: Diagnosis not present

## 2018-06-25 LAB — POCT RAPID STREP A (OFFICE): Rapid Strep A Screen: POSITIVE — AB

## 2018-06-25 MED ORDER — AMOXICILLIN 500 MG PO CAPS: 500.0000 mg | ORAL_CAPSULE | Freq: Two times a day (BID) | ORAL | 0 refills | Status: AC

## 2018-06-25 NOTE — Assessment & Plan Note (Signed)
-  POC Strep test positive -Will cover empirically with amoxicillin -Increase fluid intake -Warm salt water gargles/tylenol for comfort.

## 2018-06-25 NOTE — Progress Notes (Signed)
Chad Norris - 84 y.o. male MRN [id number]  Date of birth: [birth year]  Subjective Chief Complaint  Patient presents with  . Sore Throat    HPI Chad Norris is a 84 y.o. male here today with complaint of pharyngitis, mild congestion and fatigue.  He reports that symptoms started about 2 weeks ago and have not really improved.  Has tried coricidin without improvement.  He doesn't think he has had fever.  He denies shortness of breath, nausea, vomiting, diarrhea, headache, ear or sinus pain.    ROS:  A comprehensive ROS was completed and negative except as noted per HPI  No Known Allergies  Past Medical History:  Diagnosis Date  . Colon polyps   . Diverticulosis   . Gout   . Hemorrhoids   . Hyperlipemia   . Nephrolithiasis   . Prostate cancer [facility])     Past Surgical History:  Procedure Laterality Date  . CATARACT EXTRACTION W/ INTRAOCULAR LENS  IMPLANT, BILATERAL     bi-lat  . CERVICAL SPINE SURGERY    . COLONOSCOPY W/ POLYPECTOMY    . Epidural steroid injections     3-4, Dr Ellene Route  . RETROPUBIC PROSTATECTOMY    . SHOULDER SURGERY     left    Social History   Socioeconomic History  . Marital status: Married    Spouse name: Not on file  . Number of children: 2  . Years of education: Not on file  . Highest education level: Not on file  Occupational History  . Occupation: retired  Scientific laboratory technician  . Financial resource strain: Not on file  . Food insecurity:    Worry: Not on file    Inability: Not on file  . Transportation needs:    Medical: Not on file    Non-medical: Not on file  Tobacco Use  . Smoking status: Never Smoker  . Smokeless tobacco: Never Used  Substance and Sexual Activity  . Alcohol use: Yes    Alcohol/week: 1.0 standard drinks    Types: 1 Cans of beer per week  . Drug use: No  . Sexual activity: Not on file  Lifestyle  . Physical activity:    Days per week: Not on file    Minutes per session: Not on file  . Stress: Not on file    Relationships  . Social connections:    Talks on phone: Not on file    Gets together: Not on file    Attends religious service: Not on file    Active member of club or organization: Not on file    Attends meetings of clubs or organizations: Not on file    Relationship status: Not on file  Other Topics Concern  . Not on file  Social History Narrative  . Not on file    Family History  Problem Relation Age of Onset  . Kidney cancer Father   . Colon cancer Father   . Cancer Brother        ?  [name] Heart attack Brother 63  . Heart attack Sister 21  . Breast cancer Sister   . Diabetes Brother   . Alcohol abuse Brother   . Colon polyps Brother     Health Maintenance  Topic Date Due  . FOOT EXAM  12/21/1943  . OPHTHALMOLOGY EXAM  12/21/1943  . URINE MICROALBUMIN  12/21/1943  . TETANUS/TDAP  12/20/1952  . HEMOGLOBIN A1C  09/11/2017  . INFLUENZA VACCINE  Completed  . PNA vac Low Risk  Adult  Completed    ----------------------------------------------------------------------------------------------------------------------------------------------------------------------------------------------------------------- Physical Exam BP 130/80   Pulse 95   Temp 98 F (36.7 C)   Wt 217 lb 12.8 oz (98.8 kg)   SpO2 96%   BMI 29.95 kg/m   Physical Exam  Constitutional: He is oriented to person, place, and time. He appears well-nourished. No distress.  HENT:  Head: Normocephalic and atraumatic.  Posterior OP erythema.   Eyes: No scleral icterus.  Cardiovascular: Normal rate and regular rhythm.  Pulmonary/Chest: Effort normal and breath sounds normal.  Lymphadenopathy:    He has cervical adenopathy (b/l anterior cervical. ).  Neurological: He is alert and oriented to person, place, and time.  Skin: Skin is warm and dry. No rash noted.  Psychiatric: He has a normal mood and affect. His behavior is normal.     ------------------------------------------------------------------------------------------------------------------------------------------------------------------------------------------------------------------- Assessment and Plan  Sore throat -POC Strep test positive -Will cover empirically with amoxicillin -Increase fluid intake -Warm salt water gargles/tylenol for comfort.

## 2018-06-25 NOTE — Patient Instructions (Signed)

## 2018-08-18 ENCOUNTER — Encounter (HOSPITAL_COMMUNITY): Payer: Self-pay | Admitting: Emergency Medicine

## 2018-08-18 ENCOUNTER — Emergency Department (HOSPITAL_COMMUNITY): Payer: Medicare Other

## 2018-08-18 ENCOUNTER — Ambulatory Visit: Payer: Self-pay | Admitting: *Deleted

## 2018-08-18 ENCOUNTER — Inpatient Hospital Stay (HOSPITAL_COMMUNITY)
Admission: EM | Admit: 2018-08-18 | Discharge: 2018-08-19 | DRG: 066 | Disposition: A | Discharge status: Home / Self Care | Payer: Medicare Other | Attending: Internal Medicine | Admitting: Internal Medicine

## 2018-08-18 ENCOUNTER — Other Ambulatory Visit: Payer: Self-pay

## 2018-08-18 DIAGNOSIS — Z8371 Family history of colonic polyps: Secondary | ICD-10-CM

## 2018-08-18 DIAGNOSIS — Z8673 Personal history of transient ischemic attack (TIA), and cerebral infarction without residual deficits: Secondary | ICD-10-CM | POA: Diagnosis present

## 2018-08-18 DIAGNOSIS — K5790 Diverticulosis of intestine, part unspecified, without perforation or abscess without bleeding: Secondary | ICD-10-CM | POA: Diagnosis not present

## 2018-08-18 DIAGNOSIS — M109 Gout, unspecified: Secondary | ICD-10-CM | POA: Diagnosis present

## 2018-08-18 DIAGNOSIS — E785 Hyperlipidemia, unspecified: Secondary | ICD-10-CM | POA: Diagnosis not present

## 2018-08-18 DIAGNOSIS — I639 Cerebral infarction, unspecified: Secondary | ICD-10-CM | POA: Diagnosis present

## 2018-08-18 DIAGNOSIS — Z9841 Cataract extraction status, right eye: Secondary | ICD-10-CM | POA: Diagnosis not present

## 2018-08-18 DIAGNOSIS — Z833 Family history of diabetes mellitus: Secondary | ICD-10-CM | POA: Diagnosis not present

## 2018-08-18 DIAGNOSIS — R29700 NIHSS score 0: Secondary | ICD-10-CM | POA: Diagnosis present

## 2018-08-18 DIAGNOSIS — Z803 Family history of malignant neoplasm of breast: Secondary | ICD-10-CM | POA: Diagnosis not present

## 2018-08-18 DIAGNOSIS — H538 Other visual disturbances: Secondary | ICD-10-CM | POA: Diagnosis not present

## 2018-08-18 DIAGNOSIS — R471 Dysarthria and anarthria: Secondary | ICD-10-CM | POA: Diagnosis present

## 2018-08-18 DIAGNOSIS — Z8546 Personal history of malignant neoplasm of prostate: Secondary | ICD-10-CM

## 2018-08-18 DIAGNOSIS — R4701 Aphasia: Secondary | ICD-10-CM | POA: Diagnosis not present

## 2018-08-18 DIAGNOSIS — Z811 Family history of alcohol abuse and dependence: Secondary | ICD-10-CM

## 2018-08-18 DIAGNOSIS — Z87442 Personal history of urinary calculi: Secondary | ICD-10-CM

## 2018-08-18 DIAGNOSIS — Z8051 Family history of malignant neoplasm of kidney: Secondary | ICD-10-CM | POA: Diagnosis not present

## 2018-08-18 DIAGNOSIS — Z85828 Personal history of other malignant neoplasm of skin: Secondary | ICD-10-CM | POA: Diagnosis not present

## 2018-08-18 DIAGNOSIS — R51 Headache: Secondary | ICD-10-CM | POA: Diagnosis not present

## 2018-08-18 DIAGNOSIS — I63522 Cerebral infarction due to unspecified occlusion or stenosis of left anterior cerebral artery: Secondary | ICD-10-CM | POA: Diagnosis not present

## 2018-08-18 DIAGNOSIS — Z800 Family history of malignant neoplasm of digestive organs: Secondary | ICD-10-CM

## 2018-08-18 DIAGNOSIS — Z9842 Cataract extraction status, left eye: Secondary | ICD-10-CM | POA: Diagnosis not present

## 2018-08-18 DIAGNOSIS — R4781 Slurred speech: Secondary | ICD-10-CM | POA: Diagnosis not present

## 2018-08-18 DIAGNOSIS — Z8719 Personal history of other diseases of the digestive system: Secondary | ICD-10-CM

## 2018-08-18 DIAGNOSIS — R0683 Snoring: Secondary | ICD-10-CM

## 2018-08-18 DIAGNOSIS — Z9079 Acquired absence of other genital organ(s): Secondary | ICD-10-CM

## 2018-08-18 DIAGNOSIS — I10 Essential (primary) hypertension: Secondary | ICD-10-CM | POA: Diagnosis present

## 2018-08-18 DIAGNOSIS — Z961 Presence of intraocular lens: Secondary | ICD-10-CM | POA: Diagnosis present

## 2018-08-18 DIAGNOSIS — I63512 Cerebral infarction due to unspecified occlusion or stenosis of left middle cerebral artery: Secondary | ICD-10-CM | POA: Diagnosis not present

## 2018-08-18 DIAGNOSIS — Z8249 Family history of ischemic heart disease and other diseases of the circulatory system: Secondary | ICD-10-CM | POA: Diagnosis not present

## 2018-08-18 DIAGNOSIS — G4733 Obstructive sleep apnea (adult) (pediatric): Secondary | ICD-10-CM | POA: Diagnosis present

## 2018-08-18 DIAGNOSIS — R7303 Prediabetes: Secondary | ICD-10-CM | POA: Diagnosis not present

## 2018-08-18 LAB — URINALYSIS, ROUTINE W REFLEX MICROSCOPIC
Bilirubin Urine: NEGATIVE
Glucose, UA: NEGATIVE mg/dL
Hgb urine dipstick: NEGATIVE
Ketones, ur: NEGATIVE mg/dL
Leukocytes, UA: NEGATIVE
Nitrite: NEGATIVE
Protein, ur: NEGATIVE mg/dL
Specific Gravity, Urine: 1.006 (ref 1.005–1.030)
pH: 5 (ref 5.0–8.0)

## 2018-08-18 LAB — CBC
HCT: 42.6 % (ref 39.0–52.0)
Hemoglobin: 14.2 g/dL (ref 13.0–17.0)
MCH: 31.3 pg (ref 26.0–34.0)
MCHC: 33.3 g/dL (ref 30.0–36.0)
MCV: 93.8 fL (ref 80.0–100.0)
Platelets: 164 10*3/uL (ref 150–400)
RBC: 4.54 MIL/uL (ref 4.22–5.81)
RDW: 12.4 % (ref 11.5–15.5)
WBC: 5.7 10*3/uL (ref 4.0–10.5)
nRBC: 0 % (ref 0.0–0.2)

## 2018-08-18 LAB — COMPREHENSIVE METABOLIC PANEL
ALT: 27 U/L (ref 0–44)
AST: 26 U/L (ref 15–41)
Albumin: 4 g/dL (ref 3.5–5.0)
Alkaline Phosphatase: 35 U/L — ABNORMAL LOW (ref 38–126)
Anion gap: 9 (ref 5–15)
BUN: 17 mg/dL (ref 8–23)
CO2: 23 mmol/L (ref 22–32)
Calcium: 9.3 mg/dL (ref 8.9–10.3)
Chloride: 105 mmol/L (ref 98–111)
Creatinine, Ser: 1.35 mg/dL — ABNORMAL HIGH (ref 0.61–1.24)
GFR calc Af Amer: 55 mL/min — ABNORMAL LOW (ref >60)
GFR calc non Af Amer: 48 mL/min — ABNORMAL LOW (ref >60)
Glucose, Bld: 128 mg/dL — ABNORMAL HIGH (ref 70–99)
Potassium: 4.1 mmol/L (ref 3.5–5.1)
Sodium: 137 mmol/L (ref 135–145)
Total Bilirubin: 0.9 mg/dL (ref 0.3–1.2)
Total Protein: 6.9 g/dL (ref 6.5–8.1)

## 2018-08-18 LAB — PROTIME-INR
INR: 1.06
Prothrombin Time: 13.7 seconds (ref 11.4–15.2)

## 2018-08-18 LAB — DIFFERENTIAL
Abs Immature Granulocytes: 0.03 10*3/uL (ref 0.00–0.07)
Basophils Absolute: 0.1 10*3/uL (ref 0.0–0.1)
Basophils Relative: 1 %
Eosinophils Absolute: 0.2 10*3/uL (ref 0.0–0.5)
Eosinophils Relative: 4 %
Immature Granulocytes: 1 %
Lymphocytes Relative: 45 %
Lymphs Abs: 2.6 10*3/uL (ref 0.7–4.0)
Monocytes Absolute: 0.5 10*3/uL (ref 0.1–1.0)
Monocytes Relative: 9 %
Neutro Abs: 2.3 10*3/uL (ref 1.7–7.7)
Neutrophils Relative %: 40 %

## 2018-08-18 LAB — RAPID URINE DRUG SCREEN, HOSP PERFORMED
Amphetamines: NOT DETECTED
Barbiturates: NOT DETECTED
Benzodiazepines: NOT DETECTED
Cocaine: NOT DETECTED
Opiates: NOT DETECTED
Tetrahydrocannabinol: NOT DETECTED

## 2018-08-18 LAB — I-STAT TROPONIN, ED: Troponin i, poc: 0 ng/mL (ref 0.00–0.08)

## 2018-08-18 LAB — APTT: aPTT: 39 seconds — ABNORMAL HIGH (ref 24–36)

## 2018-08-18 LAB — ETHANOL: Alcohol, Ethyl (B): <10 mg/dL (ref <10)

## 2018-08-18 MED ORDER — GADOBUTROL 1 MMOL/ML IV SOLN
10.0000 mL | Freq: Once | INTRAVENOUS | Status: AC | PRN
  Administered 2018-08-18: 9 mL via INTRAVENOUS

## 2018-08-18 MED ORDER — ATORVASTATIN CALCIUM 40 MG PO TABS
40.0000 mg | ORAL_TABLET | Freq: Every day | ORAL | Status: DC
  Administered 2018-08-19: 40 mg via ORAL
  Filled 2018-08-18: qty 1

## 2018-08-18 MED ORDER — IBUPROFEN 200 MG PO TABS: 400.0000 mg | ORAL_TABLET | Freq: Four times a day (QID) | ORAL | Status: DC | PRN

## 2018-08-18 MED ORDER — CLOPIDOGREL BISULFATE 75 MG PO TABS
300.0000 mg | ORAL_TABLET | Freq: Once | ORAL | Status: DC
  Filled 2018-08-18: qty 4

## 2018-08-18 MED ORDER — STROKE: EARLY STAGES OF RECOVERY BOOK
Freq: Once | Status: AC
  Administered 2018-08-18: 21:00:00
  Filled 2018-08-18: qty 1

## 2018-08-18 MED ORDER — ASPIRIN 81 MG PO CHEW
81.0000 mg | CHEWABLE_TABLET | Freq: Every day | ORAL | Status: DC
  Administered 2018-08-18 – 2018-08-19 (×2): 81 mg via ORAL
  Filled 2018-08-18 (×2): qty 1

## 2018-08-18 MED ORDER — CLOPIDOGREL BISULFATE 75 MG PO TABS
75.0000 mg | ORAL_TABLET | Freq: Every day | ORAL | Status: DC
  Administered 2018-08-19: 75 mg via ORAL
  Filled 2018-08-18: qty 1

## 2018-08-18 MED ORDER — ENOXAPARIN SODIUM 40 MG/0.4ML ~~LOC~~ SOLN
40.0000 mg | SUBCUTANEOUS | Status: DC
  Administered 2018-08-18: 40 mg via SUBCUTANEOUS
  Filled 2018-08-18: qty 0.4

## 2018-08-18 NOTE — ED Triage Notes (Signed)
Pt arrives via POV from home with generalized weakness for the last 56mo. Saturday c/o headache. Yesterday had slurred speech and aphasia beginning 2pm. Denies CP, SOB. Follows commands, oriented x4.

## 2018-08-18 NOTE — Consult Note (Signed)
Neurology Consultation Reason for Consult: Slurred speech Referring Physician: rees, e  CC: Slurred speech  History is obtained from: Patient, wife  HPI: Chad Norris is a 85 y.o. male with a history of hyperlipidemia who does not take any medications who presents with slurred speech that started 2 PM yesterday.  He states that he had a transient episode of generalized weakness, blurred vision at the same time.  Following this, his speech has been slurred.  Apparently these spells of generalized weakness have been coming and going over the past few months.  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   LKW: 2 PM tpa given?: no, out of window    ROS: A 14 point ROS was performed and is negative except as noted in the HPI.  Past Medical History:  Diagnosis Date  . Colon polyps   . Diverticulosis   . Gout   . Hemorrhoids   . Hyperlipemia   . Nephrolithiasis   . Prostate cancer [facility])      Family History  Problem Relation Age of Onset  . Kidney cancer Father   . Colon cancer Father   . Cancer Brother        ?  [name] Heart attack Brother 59  . Heart attack Sister 31  . Breast cancer Sister   . Diabetes Brother   . Alcohol abuse Brother   . Colon polyps Brother      Social History:  reports that he has never smoked. He has never used smokeless tobacco. He reports current alcohol use of about 1.0 standard drinks of alcohol per week. He reports that he does not use drugs.   Exam: Current vital signs: BP (!) 141/81   Pulse (!) 50   Temp 97.6 F (36.4 C) (Oral)   Resp 17   Ht 6' (1.829 m)   Wt 95.3 kg   SpO2 97%   BMI 28.48 kg/m  Vital signs in last 24 hours: Temp:  [97.6 F (36.4 C)-98.4 F (36.9 C)] 97.6 F (36.4 C) (01/27 1019) Pulse Rate:  [48-54] 50 (01/27 1130) Resp:  [12-25] 17 (01/27 1130) BP: (121-151)/(64-81) 141/81 (01/27 1130) SpO2:  [95  %-98 %] 97 % (01/27 1130) Weight:  [95.3 kg] 95.3 kg (01/27 1017)   Physical Exam  Constitutional: Appears well-developed and well-nourished.  Psych: Affect appropriate to situation Eyes: No scleral injection HENT: No OP obstrucion Head: Normocephalic.  Cardiovascular: Normal rate and regular rhythm.  Respiratory: Effort normal, non-labored breathing GI: Soft.  No distension. There is no tenderness.  Skin: WDI  Neuro: Mental Status: Patient is awake, alert, oriented to person, place, month, year, and situation. Patient is able to give a clear and coherent history. No signs of aphasia or neglect Mild dysarthria Cranial Nerves: II: Visual Fields are full. Pupils are equal, round, and reactive to light.   III,IV, VI: EOMI without ptosis or diploplia.  V: Facial sensation is symmetric to temperature VII: Facial movement with ? Mild decreased R NL fold VIII: hearing is intact to voice X: Uvula elevates symmetrically XI: Shoulder shrug is symmetric. XII: tongue is midline without atrophy or fasciculations.  Motor: Tone is normal. Bulk is normal. 5/5 strength was present in all four extremities.  Sensory: Sensation is symmetric to light touch and temperature in the arms and legs. Cerebellar: FNF intact bilaterally   I have reviewed labs in epic and the  results pertinent to this consultation are: UA - negative  I have reviewed the images obtained: MRI brain - subcortical infarct on the left.   Impression: 85 yo M with what I suspect is likely small vessel ischemic stroke. With the description of generalized weakness, I do wonder about a transient generalized hypoperfusion resulting in focal stroke due to small vessel disease. He will need admission for secondary risk factor modification.   Recommendations: - HgbA1c, fasting lipid panel - Frequent neuro checks - Echocardiogram - Prophylactic therapy-Antiplatelet med: Aspirin - 81mg  daily + plavix 75mg  daily after 300mg  load -  Risk factor modification - Telemetry monitoring - PT consult, OT consult, Speech consult - Stroke team to follow   Roland Rack, MD [facility] [phone]  If 7pm- 7am, please page neurology on call as listed in [place].

## 2018-08-18 NOTE — ED Provider Notes (Signed)
[place] EMERGENCY DEPARTMENT Provider Note   CSN: [id number] Arrival date & time: 08/18/18  0957     History   Chief Complaint Chief Complaint  Patient presents with  . Aphasia    HPI Chad Norris is a 85 y.o. male with history of hyperlipidemia, nephrolithiasis, prediabetes, degenerative disc disease, diverticulosis presenting for evaluation of acute onset, persistent slurred speech and vision changes since 2 PM yesterday.  Wife reports that while they were watching television she noted that he was having some slurred speech.  He reported to her that he was having difficulty putting words together.  He did not want to be evaluated at that time.  He did note some bilateral blurred vision at that time.  Wife notes that he has had intermittent generalized weakness for 6 months, at times to the point that he could not ambulate independently.  He did have a headache on Saturday evening which has resolved.  No unilateral or focal weakness.  He is a non-smoker.  His wife called his primary care office who recommended presentation to the ED for further evaluation.  Presently the patient denies chest pain, shortness of breath, abdominal pain, nausea, vomiting, numbness, tingling, or dizziness. The history is provided by the patient.    Past Medical History:  Diagnosis Date  . Colon polyps   . Diverticulosis   . Gout   . Hemorrhoids   . Hyperlipemia   . Nephrolithiasis   . Prostate cancer [facility])     Patient Active Problem List   Diagnosis Date Noted  . Sore throat 06/25/2018  . Well adult exam 03/27/2018  . Hyperlipidemia 03/27/2018  . Basal cell carcinoma (BCC) of [place] 03/11/2017  . Prediabetes 03/11/2017  . DDD (degenerative disc disease), lumbosacral 01/13/2014  . NONSPECIFIC ABNORMAL ELECTROCARDIOGRAM 06/21/2008  . PROSTATE CANCER, HX OF 06/21/2008  . COLONIC POLYPS, HX OF 06/21/2008  . DIVERTICULOSIS, COLON 05/07/2008  . NEPHROLITHIASIS, HX OF  05/07/2008  . History of gout 10/09/2007    Past Surgical History:  Procedure Laterality Date  . CATARACT EXTRACTION W/ INTRAOCULAR LENS  IMPLANT, BILATERAL     bi-lat  . CERVICAL SPINE SURGERY    . COLONOSCOPY W/ POLYPECTOMY    . Epidural steroid injections     3-4, Dr Ellene Route  . RETROPUBIC PROSTATECTOMY    . SHOULDER SURGERY     left        Home Medications    Prior to Admission medications   Medication Sig Start Date End Date Taking? Authorizing Provider  ibuprofen (ADVIL,MOTRIN) 200 MG tablet Take 400 mg by mouth every 6 (six) hours as needed for moderate pain.   Yes [provider]  Multiple Vitamins-Minerals (ADULT ONE DAILY GUMMIES) CHEW Chew 3 each by mouth daily. men   Yes [provider]    Family History Family History  Problem Relation Age of Onset  . Kidney cancer Father   . Colon cancer Father   . Cancer Brother        ?  [name] Heart attack Brother 26  . Heart attack Sister 70  . Breast cancer Sister   . Diabetes Brother   . Alcohol abuse Brother   . Colon polyps Brother     Social History Social History   Tobacco Use  . Smoking status: Never Smoker  . Smokeless tobacco: Never Used  Substance Use Topics  . Alcohol use: Yes    Alcohol/week: 1.0 standard drinks    Types: 1 Cans  of beer per week  . Drug use: No     Allergies   Patient has no known allergies.   Review of Systems Review of Systems  Constitutional: Negative for chills and fever.  Eyes: Positive for visual disturbance.  Respiratory: Positive for cough. Negative for shortness of breath.   Cardiovascular: Negative for chest pain.  Gastrointestinal: Negative for abdominal pain, nausea and vomiting.  Neurological: Positive for speech difficulty, weakness and headaches. Negative for syncope and facial asymmetry.  All other systems reviewed and are negative.    Physical Exam Updated Vital Signs BP (!) 150/67 (BP Location: Left Arm)   Pulse (!) 53   Temp 98 F  (36.7 C) (Oral)   Resp 17   Ht 6' (1.829 m)   Wt 95.3 kg   SpO2 95%   BMI 28.48 kg/m   Physical Exam Vitals signs and nursing note reviewed.  Constitutional:      General: He is not in acute distress.    Appearance: He is well-developed.  HENT:     Head: Normocephalic and atraumatic.  Eyes:     General:        Right eye: No discharge.        Left eye: No discharge.     Extraocular Movements: Extraocular movements intact.     Conjunctiva/sclera: Conjunctivae normal.     Pupils: Pupils are equal, round, and reactive to light.  Neck:     Musculoskeletal: Normal range of motion and neck supple.     Vascular: No JVD.     Trachea: No tracheal deviation.  Cardiovascular:     Rate and Rhythm: Normal rate.     Pulses: Normal pulses.     Heart sounds: Normal heart sounds.  Pulmonary:     Effort: Pulmonary effort is normal. No respiratory distress.     Breath sounds: Normal breath sounds. No wheezing.  Abdominal:     General: Abdomen is flat. Bowel sounds are normal. There is no distension.     Tenderness: There is no abdominal tenderness. There is no guarding or rebound.  Skin:    General: Skin is warm and dry.     Findings: No erythema.  Neurological:     Mental Status: He is alert.     Comments: Mental Status:  Alert, thought content appropriate, able to give a coherent history. Somewhat dysarthric speech. Able to follow 2 step commands without difficulty.  Cranial Nerves:  II:  Peripheral visual fields grossly normal, pupils equal, round, reactive to light III,IV, VI: ptosis not present, extra-ocular motions intact bilaterally  V,VII: smile symmetric, facial light touch sensation equal VIII: hearing grossly normal to voice  X: uvula elevates symmetrically  XI: bilateral shoulder shrug symmetric and strong XII: midline tongue extension without fassiculations Motor:  Normal tone. 5/5 strength of BUE and BLE major muscle groups including strong and equal grip strength and  dorsiflexion/plantar flexion, no pronator drift Sensory: light touch normal in all extremities. Cerebellar: normal finger-to-nose with bilateral upper extremities, Romberg sign absent Gait: normal gait and balance. Able to walk on toes and heels with ease.     Psychiatric:        Behavior: Behavior normal.      ED Treatments / Results  Labs (all labs ordered are listed, but only abnormal results are displayed) Labs Reviewed  APTT - Abnormal; Notable for the following components:      Result Value   aPTT 39 (*)    All other components within  normal limits  COMPREHENSIVE METABOLIC PANEL - Abnormal; Notable for the following components:   Glucose, Bld 128 (*)    Creatinine, Ser 1.35 (*)    Alkaline Phosphatase 35 (*)    GFR calc non Af Amer 48 (*)    GFR calc Af Amer 55 (*)    All other components within normal limits  URINALYSIS, ROUTINE W REFLEX MICROSCOPIC - Abnormal; Notable for the following components:   Color, Urine STRAW (*)    All other components within normal limits  PROTIME-INR  CBC  DIFFERENTIAL  RAPID URINE DRUG SCREEN, HOSP PERFORMED  ETHANOL  I-STAT TROPONIN, ED    EKG EKG Interpretation  Date/Time:  Monday August 18 2018 10:19:25 EST Ventricular Rate:  52 PR Interval:    QRS Duration: 160 QT Interval:  466 QTC Calculation: 434 R Axis:   -55 Text Interpretation:  Sinus rhythm Multiform ventricular premature complexes Left bundle branch block Baseline wander in lead(s) V2 V3 Confirmed by Quintella Reichert [phone]) on 08/18/2018 10:31:09 AM Also confirmed by Quintella Reichert [phone]), editor Philomena Doheny [phone])  on 08/18/2018 1:31:49 PM   Radiology Ct Head Wo Contrast  Result Date: 08/18/2018 CLINICAL DATA:  85 year old male with generalized weakness for the past 6 months. Headache 2 days ago. Yesterday slurred speech and aphasia. Initial encounter. EXAM: CT HEAD WITHOUT CONTRAST TECHNIQUE: Contiguous axial images were obtained from the base of the skull  through the vertex without intravenous contrast. COMPARISON:  No comparison head CT. FINDINGS: Brain: No intracranial hemorrhage. Chronic microvascular changes versus small infarct of indeterminate age posterior superior left lenticular nucleus. Mild global atrophy most notable parietal lobes. No intracranial mass lesion noted on this unenhanced exam. Vascular: Atherosclerotic changes without acute hyperdense vessel. Skull: No acute abnormality. Sinuses/Orbits: Post lens replacement. No acute orbital abnormality. Mucosal thickening inferior maxillary sinuses. Partial opacification superior left pterygoid plate region. Other: Mastoid air cells and middle ear cavities are clear. IMPRESSION: 1. No intracranial hemorrhage. 2. Chronic microvascular changes versus small infarct of indeterminate age posterior superior left lenticular nucleus. 3. Mild global atrophy most notable parietal lobes. Electronically Signed   By: Genia Del M.D.   On: 08/18/2018 12:02   [name] Head Wo Contrast  Result Date: 08/18/2018 CLINICAL DATA:  Acute onset of slurred speech and aphasia beginning at 2 p.m. yesterday. Patient describes some generalized weakness over the last 6 months. Acute headache beginning 2 days ago. EXAM: MRI HEAD WITHOUT AND WITH CONTRAST MRA HEAD WITHOUT CONTRAST MRA NECK WITHOUT AND WITH CONTRAST TECHNIQUE: Multiplanar, multiecho pulse sequences of the brain and surrounding structures were obtained without and with intravenous contrast. Angiographic images of the Circle of Willis were obtained using MRA technique without intravenous contrast. Angiographic images of the neck were obtained using MRA technique without and with intravenous contrast. Carotid stenosis measurements (when applicable) are obtained utilizing NASCET criteria, using the distal internal carotid diameter as the denominator. CONTRAST:  9 mL Gadavist COMPARISON:  CT head without contrast 08/18/2018 FINDINGS: MRI HEAD FINDINGS Brain: The  diffusion-weighted images confirm an acute/subacute nonhemorrhagic infarct in the superior posterior left lentiform nucleus. Subtle T2 signal changes are present in the same area. Minimal subcortical white-matter changes otherwise are likely within normal limits for age. Mild atrophy is within normal limits for age. The ventricles are of proportionate to the degree of atrophy. No significant extraaxial fluid collection is present. The internal auditory canals are within normal limits. The brainstem and cerebellum are within normal limits. Vascular: Flow is present in  the major intracranial arteries. Skull and upper cervical spine: The craniocervical junction is normal. Upper cervical spine is within normal limits. Marrow signal is unremarkable. Sinuses/Orbits: Minimal mucosal thickening is present along the floor of the maxillary sinuses bilaterally. No fluid levels are present. The paranasal sinuses and mastoid air cells are otherwise clear. Bilateral lens replacements are noted. Globes and orbits are otherwise unremarkable. MRA HEAD FINDINGS Time-of-flight images demonstrate normal appearance of the internal carotid arteries from the high cervical segments through the ICA termini bilaterally. The left A1 is highly stenotic or occluded. Both ACA vessels fill from the right via a patent anterior communicating artery. Signal in the left A2 segment is less robust than on the right. M1 segments are normal bilaterally. MCA bifurcations are intact. MCA branch vessels are normal bilaterally. The left vertebral artery is the dominant vessel. The right vertebral artery terminates at the PICA. Basilar artery is normal. The right posterior cerebral artery originates from basilar tip. The left posterior cerebral artery is of fetal type. A small left P1 segment is not present. PCA branch vessels are normal bilaterally. MRA NECK FINDINGS The time-of-flight images demonstrate no significant flow disturbance in either carotid  artery bifurcation. Postcontrast images demonstrate a 3 vessel arch configuration. Great vessel origins are within normal limits. The right common carotid artery is normal. Bifurcation is unremarkable. The cervical right ICA is normal. The left common carotid artery is within normal limits. Bifurcation is within normal limits. The left ICA is normal. The left vertebral artery is the dominant vessel. The right vertebral artery is hypoplastic. Flow is antegrade in both vertebral arteries. IMPRESSION: 1. Acute nonhemorrhagic infarct involving the posterior left lentiform nucleus measuring up to 1.5 cm. 2. MR brain otherwise normal for age. 3. Occluded proximal left A1 segment. 4. Both ACA vessels fill from the right with asymmetric flow to the left ACA. 5. Normal MRA of the neck. Electronically Signed   By: San Morelle M.D.   On: 08/18/2018 15:05   [name] Neck W Wo Contrast  Result Date: 08/18/2018 CLINICAL DATA:  Acute onset of slurred speech and aphasia beginning at 2 p.m. yesterday. Patient describes some generalized weakness over the last 6 months. Acute headache beginning 2 days ago. EXAM: MRI HEAD WITHOUT AND WITH CONTRAST MRA HEAD WITHOUT CONTRAST MRA NECK WITHOUT AND WITH CONTRAST TECHNIQUE: Multiplanar, multiecho pulse sequences of the brain and surrounding structures were obtained without and with intravenous contrast. Angiographic images of the Circle of Willis were obtained using MRA technique without intravenous contrast. Angiographic images of the neck were obtained using MRA technique without and with intravenous contrast. Carotid stenosis measurements (when applicable) are obtained utilizing NASCET criteria, using the distal internal carotid diameter as the denominator. CONTRAST:  9 mL Gadavist COMPARISON:  CT head without contrast 08/18/2018 FINDINGS: MRI HEAD FINDINGS Brain: The diffusion-weighted images confirm an acute/subacute nonhemorrhagic infarct in the superior posterior left  lentiform nucleus. Subtle T2 signal changes are present in the same area. Minimal subcortical white-matter changes otherwise are likely within normal limits for age. Mild atrophy is within normal limits for age. The ventricles are of proportionate to the degree of atrophy. No significant extraaxial fluid collection is present. The internal auditory canals are within normal limits. The brainstem and cerebellum are within normal limits. Vascular: Flow is present in the major intracranial arteries. Skull and upper cervical spine: The craniocervical junction is normal. Upper cervical spine is within normal limits. Marrow signal is unremarkable. Sinuses/Orbits: Minimal mucosal thickening is present  along the floor of the maxillary sinuses bilaterally. No fluid levels are present. The paranasal sinuses and mastoid air cells are otherwise clear. Bilateral lens replacements are noted. Globes and orbits are otherwise unremarkable. MRA HEAD FINDINGS Time-of-flight images demonstrate normal appearance of the internal carotid arteries from the high cervical segments through the ICA termini bilaterally. The left A1 is highly stenotic or occluded. Both ACA vessels fill from the right via a patent anterior communicating artery. Signal in the left A2 segment is less robust than on the right. M1 segments are normal bilaterally. MCA bifurcations are intact. MCA branch vessels are normal bilaterally. The left vertebral artery is the dominant vessel. The right vertebral artery terminates at the PICA. Basilar artery is normal. The right posterior cerebral artery originates from basilar tip. The left posterior cerebral artery is of fetal type. A small left P1 segment is not present. PCA branch vessels are normal bilaterally. MRA NECK FINDINGS The time-of-flight images demonstrate no significant flow disturbance in either carotid artery bifurcation. Postcontrast images demonstrate a 3 vessel arch configuration. Great vessel origins are  within normal limits. The right common carotid artery is normal. Bifurcation is unremarkable. The cervical right ICA is normal. The left common carotid artery is within normal limits. Bifurcation is within normal limits. The left ICA is normal. The left vertebral artery is the dominant vessel. The right vertebral artery is hypoplastic. Flow is antegrade in both vertebral arteries. IMPRESSION: 1. Acute nonhemorrhagic infarct involving the posterior left lentiform nucleus measuring up to 1.5 cm. 2. MR brain otherwise normal for age. 3. Occluded proximal left A1 segment. 4. Both ACA vessels fill from the right with asymmetric flow to the left ACA. 5. Normal MRA of the neck. Electronically Signed   By: San Morelle M.D.   On: 08/18/2018 15:05   Mr Brain Wo Contrast (neuro Protocol)  Result Date: 08/18/2018 CLINICAL DATA:  Acute onset of slurred speech and aphasia beginning at 2 p.m. yesterday. Patient describes some generalized weakness over the last 6 months. Acute headache beginning 2 days ago. EXAM: MRI HEAD WITHOUT AND WITH CONTRAST MRA HEAD WITHOUT CONTRAST MRA NECK WITHOUT AND WITH CONTRAST TECHNIQUE: Multiplanar, multiecho pulse sequences of the brain and surrounding structures were obtained without and with intravenous contrast. Angiographic images of the Circle of Willis were obtained using MRA technique without intravenous contrast. Angiographic images of the neck were obtained using MRA technique without and with intravenous contrast. Carotid stenosis measurements (when applicable) are obtained utilizing NASCET criteria, using the distal internal carotid diameter as the denominator. CONTRAST:  9 mL Gadavist COMPARISON:  CT head without contrast 08/18/2018 FINDINGS: MRI HEAD FINDINGS Brain: The diffusion-weighted images confirm an acute/subacute nonhemorrhagic infarct in the superior posterior left lentiform nucleus. Subtle T2 signal changes are present in the same area. Minimal subcortical  white-matter changes otherwise are likely within normal limits for age. Mild atrophy is within normal limits for age. The ventricles are of proportionate to the degree of atrophy. No significant extraaxial fluid collection is present. The internal auditory canals are within normal limits. The brainstem and cerebellum are within normal limits. Vascular: Flow is present in the major intracranial arteries. Skull and upper cervical spine: The craniocervical junction is normal. Upper cervical spine is within normal limits. Marrow signal is unremarkable. Sinuses/Orbits: Minimal mucosal thickening is present along the floor of the maxillary sinuses bilaterally. No fluid levels are present. The paranasal sinuses and mastoid air cells are otherwise clear. Bilateral lens replacements are noted. Globes and orbits  are otherwise unremarkable. MRA HEAD FINDINGS Time-of-flight images demonstrate normal appearance of the internal carotid arteries from the high cervical segments through the ICA termini bilaterally. The left A1 is highly stenotic or occluded. Both ACA vessels fill from the right via a patent anterior communicating artery. Signal in the left A2 segment is less robust than on the right. M1 segments are normal bilaterally. MCA bifurcations are intact. MCA branch vessels are normal bilaterally. The left vertebral artery is the dominant vessel. The right vertebral artery terminates at the PICA. Basilar artery is normal. The right posterior cerebral artery originates from basilar tip. The left posterior cerebral artery is of fetal type. A small left P1 segment is not present. PCA branch vessels are normal bilaterally. MRA NECK FINDINGS The time-of-flight images demonstrate no significant flow disturbance in either carotid artery bifurcation. Postcontrast images demonstrate a 3 vessel arch configuration. Great vessel origins are within normal limits. The right common carotid artery is normal. Bifurcation is unremarkable.  The cervical right ICA is normal. The left common carotid artery is within normal limits. Bifurcation is within normal limits. The left ICA is normal. The left vertebral artery is the dominant vessel. The right vertebral artery is hypoplastic. Flow is antegrade in both vertebral arteries. IMPRESSION: 1. Acute nonhemorrhagic infarct involving the posterior left lentiform nucleus measuring up to 1.5 cm. 2. MR brain otherwise normal for age. 3. Occluded proximal left A1 segment. 4. Both ACA vessels fill from the right with asymmetric flow to the left ACA. 5. Normal MRA of the neck. Electronically Signed   By: San Morelle M.D.   On: 08/18/2018 15:05    Procedures Procedures (including critical care time)  Medications Ordered in ED Medications  gadobutrol (GADAVIST) 1 MMOL/ML injection 10 mL (9 mLs Intravenous Contrast Given 08/18/18 1445)     Initial Impression / Assessment and Plan / ED Course  I have reviewed the triage vital signs and the nursing notes.  Pertinent labs & imaging results that were available during my care of the patient were reviewed by me and considered in my medical decision making (see chart for details).     Patient presenting for evaluation of dysarthric speech and blurred vision bilaterally beginning 2 PM yesterday.  He is afebrile, persistently bradycardic and mildly hypertensive in the ED.  He is nontoxic in appearance.  He exhibits some mildly dysarthric speech on examination, otherwise no focal neurologic deficits.  Strength is intact on examination.  He is outside of the window for TPA and is not LVO+ given no weakness on examination.  Lab work reviewed by me shows mildly elevated creatinine, no metabolic derangements, leukocytosis, or anemia.  His APTT is prolonged.  EKG shows normal sinus rhythm.  Head CT shows no intracranial hemorrhage but does show a possible age-indeterminate infarct of the posterior superior left lenticular nucleus.  Spoke with Dr. Malen Gauze  with neurology who recommends obtaining MRI of the brain and MRA of the head and neck.   2:47 PM Reviewed images with Dr. Malen Gauze.  MRI shows acute left MCA territory infarct.  Spoke with Dr. Sloan Leiter with [facility] hospitalist service who agrees to assume care of patient and bring him into the hospital for further evaluation and management.  Final Clinical Impressions(s) / ED Diagnoses   Final diagnoses:  Acute ischemic left MCA stroke [facility])    ED Discharge Orders    None       Debroah Baller 08/18/18 1514    Quintella Reichert, MD 08/20/18  1014  

## 2018-08-18 NOTE — ED Notes (Signed)
Pt remains in MRI 

## 2018-08-18 NOTE — H&P (Signed)
History and Physical    Chad Norris QTM:[id number] DOB: 1932/04/08 DOA: 08/18/2018  PCP: Luetta Nutting, DO  Patient coming from: home   I have personally briefly reviewed patient's old medical records available.   Chief Complaint: slurred speech X 1 day   HPI: Chad Norris is a 85 y.o. male with history of prediabetes, no significant medical problems presenting to the hospital with family with complaints of slurred speech since yesterday afternoon.  Patient is hard of hearing.  He currently denies any complaints.  Wife noted that when patient was talking he was slurring since 2 PM yesterday.  He would not like to come to the hospital.  He also complained of some headache since the day before, generalized and currently improved.  No other symptoms.  Family did not notice any facial asymmetry, drooling.  Family were still noticing his speech difficulty in the morning, they called primary care physician and was advised to come to ER.  His voice has cleared by the time he arrived to the ER.  In the emergency room, he has no neurological deficits. ED Course: Hemodynamically stable.  Electrolytes normal.  No neuro deficits on arrival.  He has some dysarthria, however family thinks it is back to normal.  Stroke alert was called.  Seen by neurology.  CT scan was negative.  MRI showed lenticular infarction.  Review of Systems: As per HPI otherwise 10 point review of systems negative.    Past Medical History:  Diagnosis Date  . Colon polyps   . Diverticulosis   . Gout   . Hemorrhoids   . Hyperlipemia   . Nephrolithiasis   . Prostate cancer [facility])     Past Surgical History:  Procedure Laterality Date  . CATARACT EXTRACTION W/ INTRAOCULAR LENS  IMPLANT, BILATERAL     bi-lat  . CERVICAL SPINE SURGERY    . COLONOSCOPY W/ POLYPECTOMY    . Epidural steroid injections     3-4, Dr Ellene Route  . RETROPUBIC PROSTATECTOMY    . SHOULDER SURGERY     left     reports that he has never smoked. He has  never used smokeless tobacco. He reports current alcohol use of about 1.0 standard drinks of alcohol per week. He reports that he does not use drugs.  No Known Allergies  Family History  Problem Relation Age of Onset  . Kidney cancer Father   . Colon cancer Father   . Cancer Brother        ?  [name] Heart attack Brother 44  . Heart attack Sister 49  . Breast cancer Sister   . Diabetes Brother   . Alcohol abuse Brother   . Colon polyps Brother      Prior to Admission medications   Medication Sig Start Date End Date Taking? Authorizing Provider  ibuprofen (ADVIL,MOTRIN) 200 MG tablet Take 400 mg by mouth every 6 (six) hours as needed for moderate pain.   Yes [provider]  Multiple Vitamins-Minerals (ADULT ONE DAILY GUMMIES) CHEW Chew 3 each by mouth daily. men   Yes [provider]    Physical Exam: Vitals:   08/18/18 1100 08/18/18 1115 08/18/18 1130 08/18/18 1501  BP: (!) 142/64 134/79 (!) 141/81 (!) 150/67  Pulse: (!) 50 (!) 48 (!) 50 (!) 53  Resp: (!) 25 15 17    Temp:    98 F (36.7 C)  TempSrc:    Oral  SpO2: 97% 98% 97% 95%  Weight:      Height:  Constitutional: NAD, calm, comfortable Vitals:   08/18/18 1100 08/18/18 1115 08/18/18 1130 08/18/18 1501  BP: (!) 142/64 134/79 (!) 141/81 (!) 150/67  Pulse: (!) 50 (!) 48 (!) 50 (!) 53  Resp: (!) 25 15 17    Temp:    98 F (36.7 C)  TempSrc:    Oral  SpO2: 97% 98% 97% 95%  Weight:      Height:       Eyes: PERRL, lids and conjunctivae normal ENMT: Mucous membranes are moist. Posterior pharynx clear of any exudate or lesions.Normal dentition.  Neck: normal, supple, no masses, no thyromegaly Respiratory: clear to auscultation bilaterally, no wheezing, no crackles. Normal respiratory effort. No accessory muscle use.  Cardiovascular: Regular rate and rhythm, no murmurs / rubs / gallops. No extremity edema. 2+ pedal pulses. No carotid bruits.  Abdomen: no tenderness, no masses palpated. No  hepatosplenomegaly. Bowel sounds positive.  Musculoskeletal: no clubbing / cyanosis. No joint deformity upper and lower extremities. Good ROM, no contractures. Normal muscle tone.  Skin: no rashes, lesions, ulcers. No induration Neurologic: CN 2-12 grossly intact. Sensation intact, DTR normal. Strength 5/5 in all 4. He has slightly dysarthric speech that family thinks is normal.  Psychiatric: Normal judgment and insight. Alert and oriented x 3. Normal mood.     Labs on Admission: I have personally reviewed following labs and imaging studies  CBC: Recent Labs  Lab 08/18/18 1031  WBC 5.7  NEUTROABS 2.3  HGB 14.2  HCT 42.6  MCV 93.8  PLT 030   Basic Metabolic Panel: Recent Labs  Lab 08/18/18 1031  NA 137  K 4.1  CL 105  CO2 23  GLUCOSE 128*  BUN 17  CREATININE 1.35*  CALCIUM 9.3   GFR: Estimated Creatinine Clearance: 48.8 mL/min (A) (by C-G formula based on SCr of 1.35 mg/dL (H)). Liver Function Tests: Recent Labs  Lab 08/18/18 1031  AST 26  ALT 27  ALKPHOS 35*  BILITOT 0.9  PROT 6.9  ALBUMIN 4.0   No results for input(s): LIPASE, AMYLASE in the last 168 hours. No results for input(s): AMMONIA in the last 168 hours. Coagulation Profile: Recent Labs  Lab 08/18/18 1031  INR 1.06   Cardiac Enzymes: No results for input(s): CKTOTAL, CKMB, CKMBINDEX, TROPONINI in the last 168 hours. BNP (last 3 results) No results for input(s): PROBNP in the last 8760 hours. HbA1C: No results for input(s): HGBA1C in the last 72 hours. CBG: No results for input(s): GLUCAP in the last 168 hours. Lipid Profile: No results for input(s): CHOL, HDL, LDLCALC, TRIG, CHOLHDL, LDLDIRECT in the last 72 hours. Thyroid Function Tests: No results for input(s): TSH, T4TOTAL, FREET4, T3FREE, THYROIDAB in the last 72 hours. Anemia Panel: No results for input(s): VITAMINB12, FOLATE, FERRITIN, TIBC, IRON, RETICCTPCT in the last 72 hours. Urine analysis:    Component Value Date/Time    COLORURINE STRAW (A) 08/18/2018 1156   APPEARANCEUR CLEAR 08/18/2018 1156   LABSPEC 1.006 08/18/2018 1156   PHURINE 5.0 08/18/2018 1156   GLUCOSEU NEGATIVE 08/18/2018 1156   HGBUR NEGATIVE 08/18/2018 1156   HGBUR negative 06/21/2008 0000   BILIRUBINUR NEGATIVE 08/18/2018 1156   BILIRUBINUR neg 05/26/2015 1138   KETONESUR NEGATIVE 08/18/2018 1156   PROTEINUR NEGATIVE 08/18/2018 1156   UROBILINOGEN 0.2 05/26/2015 1138   UROBILINOGEN negative 06/21/2008 0000   NITRITE NEGATIVE 08/18/2018 1156   LEUKOCYTESUR NEGATIVE 08/18/2018 1156    Radiological Exams on Admission: Ct Head Wo Contrast  Result Date: 08/18/2018 CLINICAL DATA:  85 year old male  with generalized weakness for the past 6 months. Headache 2 days ago. Yesterday slurred speech and aphasia. Initial encounter. EXAM: CT HEAD WITHOUT CONTRAST TECHNIQUE: Contiguous axial images were obtained from the base of the skull through the vertex without intravenous contrast. COMPARISON:  No comparison head CT. FINDINGS: Brain: No intracranial hemorrhage. Chronic microvascular changes versus small infarct of indeterminate age posterior superior left lenticular nucleus. Mild global atrophy most notable parietal lobes. No intracranial mass lesion noted on this unenhanced exam. Vascular: Atherosclerotic changes without acute hyperdense vessel. Skull: No acute abnormality. Sinuses/Orbits: Post lens replacement. No acute orbital abnormality. Mucosal thickening inferior maxillary sinuses. Partial opacification superior left pterygoid plate region. Other: Mastoid air cells and middle ear cavities are clear. IMPRESSION: 1. No intracranial hemorrhage. 2. Chronic microvascular changes versus small infarct of indeterminate age posterior superior left lenticular nucleus. 3. Mild global atrophy most notable parietal lobes. Electronically Signed   By: Genia Del M.D.   On: 08/18/2018 12:02   [name] Head Wo Contrast  Result Date: 08/18/2018 CLINICAL DATA:  Acute  onset of slurred speech and aphasia beginning at 2 p.m. yesterday. Patient describes some generalized weakness over the last 6 months. Acute headache beginning 2 days ago. EXAM: MRI HEAD WITHOUT AND WITH CONTRAST MRA HEAD WITHOUT CONTRAST MRA NECK WITHOUT AND WITH CONTRAST TECHNIQUE: Multiplanar, multiecho pulse sequences of the brain and surrounding structures were obtained without and with intravenous contrast. Angiographic images of the Circle of Willis were obtained using MRA technique without intravenous contrast. Angiographic images of the neck were obtained using MRA technique without and with intravenous contrast. Carotid stenosis measurements (when applicable) are obtained utilizing NASCET criteria, using the distal internal carotid diameter as the denominator. CONTRAST:  9 mL Gadavist COMPARISON:  CT head without contrast 08/18/2018 FINDINGS: MRI HEAD FINDINGS Brain: The diffusion-weighted images confirm an acute/subacute nonhemorrhagic infarct in the superior posterior left lentiform nucleus. Subtle T2 signal changes are present in the same area. Minimal subcortical white-matter changes otherwise are likely within normal limits for age. Mild atrophy is within normal limits for age. The ventricles are of proportionate to the degree of atrophy. No significant extraaxial fluid collection is present. The internal auditory canals are within normal limits. The brainstem and cerebellum are within normal limits. Vascular: Flow is present in the major intracranial arteries. Skull and upper cervical spine: The craniocervical junction is normal. Upper cervical spine is within normal limits. Marrow signal is unremarkable. Sinuses/Orbits: Minimal mucosal thickening is present along the floor of the maxillary sinuses bilaterally. No fluid levels are present. The paranasal sinuses and mastoid air cells are otherwise clear. Bilateral lens replacements are noted. Globes and orbits are otherwise unremarkable. MRA HEAD  FINDINGS Time-of-flight images demonstrate normal appearance of the internal carotid arteries from the high cervical segments through the ICA termini bilaterally. The left A1 is highly stenotic or occluded. Both ACA vessels fill from the right via a patent anterior communicating artery. Signal in the left A2 segment is less robust than on the right. M1 segments are normal bilaterally. MCA bifurcations are intact. MCA branch vessels are normal bilaterally. The left vertebral artery is the dominant vessel. The right vertebral artery terminates at the PICA. Basilar artery is normal. The right posterior cerebral artery originates from basilar tip. The left posterior cerebral artery is of fetal type. A small left P1 segment is not present. PCA branch vessels are normal bilaterally. MRA NECK FINDINGS The time-of-flight images demonstrate no significant flow disturbance in either carotid artery bifurcation. Postcontrast  images demonstrate a 3 vessel arch configuration. Great vessel origins are within normal limits. The right common carotid artery is normal. Bifurcation is unremarkable. The cervical right ICA is normal. The left common carotid artery is within normal limits. Bifurcation is within normal limits. The left ICA is normal. The left vertebral artery is the dominant vessel. The right vertebral artery is hypoplastic. Flow is antegrade in both vertebral arteries. IMPRESSION: 1. Acute nonhemorrhagic infarct involving the posterior left lentiform nucleus measuring up to 1.5 cm. 2. MR brain otherwise normal for age. 3. Occluded proximal left A1 segment. 4. Both ACA vessels fill from the right with asymmetric flow to the left ACA. 5. Normal MRA of the neck. Electronically Signed   By: San Morelle M.D.   On: 08/18/2018 15:05   [name] Neck W Wo Contrast  Result Date: 08/18/2018 CLINICAL DATA:  Acute onset of slurred speech and aphasia beginning at 2 p.m. yesterday. Patient describes some generalized weakness  over the last 6 months. Acute headache beginning 2 days ago. EXAM: MRI HEAD WITHOUT AND WITH CONTRAST MRA HEAD WITHOUT CONTRAST MRA NECK WITHOUT AND WITH CONTRAST TECHNIQUE: Multiplanar, multiecho pulse sequences of the brain and surrounding structures were obtained without and with intravenous contrast. Angiographic images of the Circle of Willis were obtained using MRA technique without intravenous contrast. Angiographic images of the neck were obtained using MRA technique without and with intravenous contrast. Carotid stenosis measurements (when applicable) are obtained utilizing NASCET criteria, using the distal internal carotid diameter as the denominator. CONTRAST:  9 mL Gadavist COMPARISON:  CT head without contrast 08/18/2018 FINDINGS: MRI HEAD FINDINGS Brain: The diffusion-weighted images confirm an acute/subacute nonhemorrhagic infarct in the superior posterior left lentiform nucleus. Subtle T2 signal changes are present in the same area. Minimal subcortical white-matter changes otherwise are likely within normal limits for age. Mild atrophy is within normal limits for age. The ventricles are of proportionate to the degree of atrophy. No significant extraaxial fluid collection is present. The internal auditory canals are within normal limits. The brainstem and cerebellum are within normal limits. Vascular: Flow is present in the major intracranial arteries. Skull and upper cervical spine: The craniocervical junction is normal. Upper cervical spine is within normal limits. Marrow signal is unremarkable. Sinuses/Orbits: Minimal mucosal thickening is present along the floor of the maxillary sinuses bilaterally. No fluid levels are present. The paranasal sinuses and mastoid air cells are otherwise clear. Bilateral lens replacements are noted. Globes and orbits are otherwise unremarkable. MRA HEAD FINDINGS Time-of-flight images demonstrate normal appearance of the internal carotid arteries from the high  cervical segments through the ICA termini bilaterally. The left A1 is highly stenotic or occluded. Both ACA vessels fill from the right via a patent anterior communicating artery. Signal in the left A2 segment is less robust than on the right. M1 segments are normal bilaterally. MCA bifurcations are intact. MCA branch vessels are normal bilaterally. The left vertebral artery is the dominant vessel. The right vertebral artery terminates at the PICA. Basilar artery is normal. The right posterior cerebral artery originates from basilar tip. The left posterior cerebral artery is of fetal type. A small left P1 segment is not present. PCA branch vessels are normal bilaterally. MRA NECK FINDINGS The time-of-flight images demonstrate no significant flow disturbance in either carotid artery bifurcation. Postcontrast images demonstrate a 3 vessel arch configuration. Great vessel origins are within normal limits. The right common carotid artery is normal. Bifurcation is unremarkable. The cervical right ICA is normal. The  left common carotid artery is within normal limits. Bifurcation is within normal limits. The left ICA is normal. The left vertebral artery is the dominant vessel. The right vertebral artery is hypoplastic. Flow is antegrade in both vertebral arteries. IMPRESSION: 1. Acute nonhemorrhagic infarct involving the posterior left lentiform nucleus measuring up to 1.5 cm. 2. MR brain otherwise normal for age. 3. Occluded proximal left A1 segment. 4. Both ACA vessels fill from the right with asymmetric flow to the left ACA. 5. Normal MRA of the neck. Electronically Signed   By: San Morelle M.D.   On: 08/18/2018 15:05   Mr Brain Wo Contrast (neuro Protocol)  Result Date: 08/18/2018 CLINICAL DATA:  Acute onset of slurred speech and aphasia beginning at 2 p.m. yesterday. Patient describes some generalized weakness over the last 6 months. Acute headache beginning 2 days ago. EXAM: MRI HEAD WITHOUT AND WITH  CONTRAST MRA HEAD WITHOUT CONTRAST MRA NECK WITHOUT AND WITH CONTRAST TECHNIQUE: Multiplanar, multiecho pulse sequences of the brain and surrounding structures were obtained without and with intravenous contrast. Angiographic images of the Circle of Willis were obtained using MRA technique without intravenous contrast. Angiographic images of the neck were obtained using MRA technique without and with intravenous contrast. Carotid stenosis measurements (when applicable) are obtained utilizing NASCET criteria, using the distal internal carotid diameter as the denominator. CONTRAST:  9 mL Gadavist COMPARISON:  CT head without contrast 08/18/2018 FINDINGS: MRI HEAD FINDINGS Brain: The diffusion-weighted images confirm an acute/subacute nonhemorrhagic infarct in the superior posterior left lentiform nucleus. Subtle T2 signal changes are present in the same area. Minimal subcortical white-matter changes otherwise are likely within normal limits for age. Mild atrophy is within normal limits for age. The ventricles are of proportionate to the degree of atrophy. No significant extraaxial fluid collection is present. The internal auditory canals are within normal limits. The brainstem and cerebellum are within normal limits. Vascular: Flow is present in the major intracranial arteries. Skull and upper cervical spine: The craniocervical junction is normal. Upper cervical spine is within normal limits. Marrow signal is unremarkable. Sinuses/Orbits: Minimal mucosal thickening is present along the floor of the maxillary sinuses bilaterally. No fluid levels are present. The paranasal sinuses and mastoid air cells are otherwise clear. Bilateral lens replacements are noted. Globes and orbits are otherwise unremarkable. MRA HEAD FINDINGS Time-of-flight images demonstrate normal appearance of the internal carotid arteries from the high cervical segments through the ICA termini bilaterally. The left A1 is highly stenotic or occluded.  Both ACA vessels fill from the right via a patent anterior communicating artery. Signal in the left A2 segment is less robust than on the right. M1 segments are normal bilaterally. MCA bifurcations are intact. MCA branch vessels are normal bilaterally. The left vertebral artery is the dominant vessel. The right vertebral artery terminates at the PICA. Basilar artery is normal. The right posterior cerebral artery originates from basilar tip. The left posterior cerebral artery is of fetal type. A small left P1 segment is not present. PCA branch vessels are normal bilaterally. MRA NECK FINDINGS The time-of-flight images demonstrate no significant flow disturbance in either carotid artery bifurcation. Postcontrast images demonstrate a 3 vessel arch configuration. Great vessel origins are within normal limits. The right common carotid artery is normal. Bifurcation is unremarkable. The cervical right ICA is normal. The left common carotid artery is within normal limits. Bifurcation is within normal limits. The left ICA is normal. The left vertebral artery is the dominant vessel. The right vertebral artery is  hypoplastic. Flow is antegrade in both vertebral arteries. IMPRESSION: 1. Acute nonhemorrhagic infarct involving the posterior left lentiform nucleus measuring up to 1.5 cm. 2. MR brain otherwise normal for age. 3. Occluded proximal left A1 segment. 4. Both ACA vessels fill from the right with asymmetric flow to the left ACA. 5. Normal MRA of the neck. Electronically Signed   By: San Morelle M.D.   On: 08/18/2018 15:05    EKG: Independently reviewed.  EKG shows left bundle branch block, present on previous EKGs from 2 years ago.  Assessment/Plan Principal Problem:   Acute ischemic stroke [facility]) Active Problems:   Prediabetes   Stroke [facility])   Acute ischemic stroke: Admit to monitored unit because of severity of symptoms. Neurochecks and vital signs as per stroke protocol. Patient was not a TPA  and vascular intervention candidate because of minimal symptoms.  Bedside swallow evaluation, patient will be started on oral diet and medications after swallow evaluation. Antiplatelets, with aspirin and Plavix as suggested by neurology. Statin, start on Lipitor 40 mg daily. Blood pressure at goals.  Will monitor. Consultations, neurology, speech, PT OT 2D echocardiogram Hemoglobin A1c and lipid profile for risk stratification.  Prediabetes with history: Check A1c.  DVT prophylaxis: Lovenox. Code Status: Full code. Family Communication: Wife and son at the bedside. Disposition Plan: Probably home with outpatient follow-up tomorrow. Consults called: Neurology. Admission status: Observation telemetry.   Barb Merino MD [facility] Pager [phone]  If 7PM-7AM, please contact night-coverage [email or web address] Password [facility]  08/18/2018, 3:18 PM

## 2018-08-18 NOTE — Telephone Encounter (Signed)
Weak spells recently per wife.  Yesterday, she noticed slurred speech. Got worse as the day went on. 122/70 b/p last night around 9:00pm. Patient reports having a headache Saturday evening-it resolved. Sunday around 2pm she noticed he began slurring his speech. No one-sided weakness/numbness/vision changes. Advised ER at this time.  Reason for Disposition . [1] Loss of speech or garbled speech AND [2] sudden onset AND [3] present now  Answer Assessment - Initial Assessment Questions 1. SYMPTOM: "What is the main symptom you are concerned about?" (e.g., weakness, numbness)     Slurred speech since yesterday 2. ONSET: "When did this start?" (minutes, hours, days; while sleeping)   Slurred speech started around 2:00p 3. LAST NORMAL: "When was the last time you were normal (no symptoms)?"     Sunday morning 4. PATTERN "Does this come and go, or has it been constant since it started?"  "Is it present now?"    constant 5. CARDIAC SYMPTOMS: "Have you had any of the following symptoms: chest pain, difficulty breathing, palpitations?"     none 6. NEUROLOGIC SYMPTOMS: "Have you had any of the following symptoms: headache, dizziness, vision loss, double vision, changes in speech, unsteady on your feet?"     Headache 2 days ago, dizziness. 7. OTHER SYMPTOMS: "Do you have any other symptoms?"     no 8. PREGNANCY: "Is there any chance you are pregnant?" "When was your last menstrual period?"     na  Protocols used: NEUROLOGIC DEFICIT-A-AH

## 2018-08-18 NOTE — ED Notes (Signed)
Patient transported to CT 

## 2018-08-18 NOTE — Progress Notes (Signed)
Patient arrived to unit with famiily.  Patient AAOx4, no pain, VSS, slightly slurred speech. Patient placed on tele box, bed in low position, call bell in reach, bed alarm on.

## 2018-08-19 ENCOUNTER — Inpatient Hospital Stay (HOSPITAL_COMMUNITY): Payer: Medicare Other

## 2018-08-19 DIAGNOSIS — Z9842 Cataract extraction status, left eye: Secondary | ICD-10-CM | POA: Diagnosis not present

## 2018-08-19 DIAGNOSIS — I639 Cerebral infarction, unspecified: Secondary | ICD-10-CM | POA: Diagnosis present

## 2018-08-19 DIAGNOSIS — Z961 Presence of intraocular lens: Secondary | ICD-10-CM | POA: Diagnosis present

## 2018-08-19 DIAGNOSIS — I63512 Cerebral infarction due to unspecified occlusion or stenosis of left middle cerebral artery: Secondary | ICD-10-CM | POA: Diagnosis not present

## 2018-08-19 DIAGNOSIS — Z8371 Family history of colonic polyps: Secondary | ICD-10-CM | POA: Diagnosis not present

## 2018-08-19 DIAGNOSIS — K5790 Diverticulosis of intestine, part unspecified, without perforation or abscess without bleeding: Secondary | ICD-10-CM | POA: Diagnosis present

## 2018-08-19 DIAGNOSIS — Z811 Family history of alcohol abuse and dependence: Secondary | ICD-10-CM | POA: Diagnosis not present

## 2018-08-19 DIAGNOSIS — Z85828 Personal history of other malignant neoplasm of skin: Secondary | ICD-10-CM | POA: Diagnosis not present

## 2018-08-19 DIAGNOSIS — H538 Other visual disturbances: Secondary | ICD-10-CM | POA: Diagnosis present

## 2018-08-19 DIAGNOSIS — Z833 Family history of diabetes mellitus: Secondary | ICD-10-CM | POA: Diagnosis not present

## 2018-08-19 DIAGNOSIS — Z8249 Family history of ischemic heart disease and other diseases of the circulatory system: Secondary | ICD-10-CM | POA: Diagnosis not present

## 2018-08-19 DIAGNOSIS — I6350 Cerebral infarction due to unspecified occlusion or stenosis of unspecified cerebral artery: Secondary | ICD-10-CM | POA: Diagnosis not present

## 2018-08-19 DIAGNOSIS — Z803 Family history of malignant neoplasm of breast: Secondary | ICD-10-CM | POA: Diagnosis not present

## 2018-08-19 DIAGNOSIS — R471 Dysarthria and anarthria: Secondary | ICD-10-CM | POA: Diagnosis present

## 2018-08-19 DIAGNOSIS — Z8719 Personal history of other diseases of the digestive system: Secondary | ICD-10-CM | POA: Diagnosis not present

## 2018-08-19 DIAGNOSIS — Z9079 Acquired absence of other genital organ(s): Secondary | ICD-10-CM | POA: Diagnosis not present

## 2018-08-19 DIAGNOSIS — Z8051 Family history of malignant neoplasm of kidney: Secondary | ICD-10-CM | POA: Diagnosis not present

## 2018-08-19 DIAGNOSIS — R4701 Aphasia: Secondary | ICD-10-CM | POA: Diagnosis present

## 2018-08-19 DIAGNOSIS — R7303 Prediabetes: Secondary | ICD-10-CM

## 2018-08-19 DIAGNOSIS — M109 Gout, unspecified: Secondary | ICD-10-CM | POA: Diagnosis present

## 2018-08-19 DIAGNOSIS — Z87442 Personal history of urinary calculi: Secondary | ICD-10-CM | POA: Diagnosis not present

## 2018-08-19 DIAGNOSIS — R29700 NIHSS score 0: Secondary | ICD-10-CM | POA: Diagnosis present

## 2018-08-19 DIAGNOSIS — Z8546 Personal history of malignant neoplasm of prostate: Secondary | ICD-10-CM | POA: Diagnosis not present

## 2018-08-19 DIAGNOSIS — Z9841 Cataract extraction status, right eye: Secondary | ICD-10-CM | POA: Diagnosis not present

## 2018-08-19 DIAGNOSIS — Z800 Family history of malignant neoplasm of digestive organs: Secondary | ICD-10-CM | POA: Diagnosis not present

## 2018-08-19 DIAGNOSIS — E785 Hyperlipidemia, unspecified: Secondary | ICD-10-CM | POA: Diagnosis present

## 2018-08-19 LAB — LIPID PANEL
Cholesterol: 197 mg/dL (ref 0–200)
HDL: 36 mg/dL — ABNORMAL LOW (ref >40)
LDL Cholesterol: 135 mg/dL — ABNORMAL HIGH (ref 0–99)
Total CHOL/HDL Ratio: 5.5 RATIO
Triglycerides: 131 mg/dL (ref <150)
VLDL: 26 mg/dL (ref 0–40)

## 2018-08-19 LAB — ECHOCARDIOGRAM COMPLETE
Height: 72 in
Weight: 3360 oz

## 2018-08-19 LAB — BASIC METABOLIC PANEL
Anion gap: 11 (ref 5–15)
BUN: 17 mg/dL (ref 8–23)
CO2: 23 mmol/L (ref 22–32)
Calcium: 8.6 mg/dL — ABNORMAL LOW (ref 8.9–10.3)
Chloride: 102 mmol/L (ref 98–111)
Creatinine, Ser: 1.15 mg/dL (ref 0.61–1.24)
GFR calc Af Amer: >60 mL/min (ref >60)
GFR calc non Af Amer: 58 mL/min — ABNORMAL LOW (ref >60)
Glucose, Bld: 113 mg/dL — ABNORMAL HIGH (ref 70–99)
Potassium: 4.1 mmol/L (ref 3.5–5.1)
Sodium: 136 mmol/L (ref 135–145)

## 2018-08-19 LAB — HEMOGLOBIN A1C
Hgb A1c MFr Bld: 5.8 % — ABNORMAL HIGH (ref 4.8–5.6)
Mean Plasma Glucose: 119.76 mg/dL

## 2018-08-19 MED ORDER — ATORVASTATIN CALCIUM 40 MG PO TABS: 40.0000 mg | ORAL_TABLET | Freq: Every day | ORAL | 0 refills | Status: DC

## 2018-08-19 MED ORDER — CLOPIDOGREL BISULFATE 75 MG PO TABS: 75.0000 mg | ORAL_TABLET | Freq: Every day | ORAL | 0 refills | Status: AC

## 2018-08-19 MED ORDER — ASPIRIN 81 MG PO CHEW: 81.0000 mg | CHEWABLE_TABLET | Freq: Every day | ORAL | 0 refills | Status: DC

## 2018-08-19 NOTE — Progress Notes (Addendum)
STROKE TEAM PROGRESS NOTE   INTERVAL HISTORY [name] He feel he is doing much better, though does have some mild drooling and decreased sensation.   Vitals:   08/18/18 1854 08/18/18 2000 08/19/18 0037 08/19/18 0406  BP: (!) 167/95 (!) 145/90 130/71 (!) 128/58  Pulse: (!) 58 (!) 57 (!) 54 (!) 56  Resp: 18 18 18  (!) 21  Temp: 98 F (36.7 C) 97.9 F (36.6 C) 98.7 F (37.1 C) 98.4 F (36.9 C)  TempSrc: Oral Oral Oral Oral  SpO2: 99% 96% 96% 99%  Weight:      Height:        CBC:  Recent Labs  Lab 08/18/18 1031  WBC 5.7  NEUTROABS 2.3  HGB 14.2  HCT 42.6  MCV 93.8  PLT 672    Basic Metabolic Panel:  Recent Labs  Lab 08/18/18 1031 08/19/18 0743  NA 137 136  K 4.1 4.1  CL 105 102  CO2 23 23  GLUCOSE 128* 113*  BUN 17 17  CREATININE 1.35* 1.15  CALCIUM 9.3 8.6*   Lipid Panel:     Component Value Date/Time   CHOL 197 08/19/2018 0212   CHOL 216 (H) 01/13/2014 1037   TRIG 131 08/19/2018 0212   TRIG 245 (H) 01/13/2014 1037   HDL 36 (L) 08/19/2018 0212   HDL 41 01/13/2014 1037   CHOLHDL 5.5 08/19/2018 0212   VLDL 26 08/19/2018 0212   LDLCALC 135 (H) 08/19/2018 0212   LDLCALC 126 (H) 01/13/2014 1037   HgbA1c:  Lab Results  Component Value Date   HGBA1C 5.8 (H) 08/19/2018   Urine Drug Screen:     Component Value Date/Time   LABOPIA NONE DETECTED 08/18/2018 1156   COCAINSCRNUR NONE DETECTED 08/18/2018 1156   LABBENZ NONE DETECTED 08/18/2018 1156   AMPHETMU NONE DETECTED 08/18/2018 1156   THCU NONE DETECTED 08/18/2018 1156   LABBARB NONE DETECTED 08/18/2018 1156    Alcohol Level     Component Value Date/Time   ETH <10 08/18/2018 1917    IMAGING Ct Head Wo Contrast  Result Date: 08/18/2018 CLINICAL DATA:  85 year old male with generalized weakness for the past 6 months. Headache 2 days ago. Yesterday slurred speech and aphasia. Initial encounter. EXAM: CT HEAD WITHOUT CONTRAST TECHNIQUE:  Contiguous axial images were obtained from the base of the skull through the vertex without intravenous contrast. COMPARISON:  No comparison head CT. FINDINGS: Brain: No intracranial hemorrhage. Chronic microvascular changes versus small infarct of indeterminate age posterior superior left lenticular nucleus. Mild global atrophy most notable parietal lobes. No intracranial mass lesion noted on this unenhanced exam. Vascular: Atherosclerotic changes without acute hyperdense vessel. Skull: No acute abnormality. Sinuses/Orbits: Post lens replacement. No acute orbital abnormality. Mucosal thickening inferior maxillary sinuses. Partial opacification superior left pterygoid plate region. Other: Mastoid air cells and middle ear cavities are clear. IMPRESSION: 1. No intracranial hemorrhage. 2. Chronic microvascular changes versus small infarct of indeterminate age posterior superior left lenticular nucleus. 3. Mild global atrophy most notable parietal lobes. Electronically Signed   By: Genia Del M.D.   On: 08/18/2018 12:02   Mr Chad Norris Head Wo Contrast  Result Date: 08/18/2018 CLINICAL DATA:  Acute onset of slurred speech and aphasia beginning at 2 p.m. yesterday. Patient describes some generalized weakness over the last 6 months. Acute headache beginning 2 days ago. EXAM: MRI HEAD WITHOUT AND WITH CONTRAST MRA HEAD WITHOUT CONTRAST MRA NECK WITHOUT AND  WITH CONTRAST TECHNIQUE: Multiplanar, multiecho pulse sequences of the brain and surrounding structures were obtained without and with intravenous contrast. Angiographic images of the Circle of Willis were obtained using MRA technique without intravenous contrast. Angiographic images of the neck were obtained using MRA technique without and with intravenous contrast. Carotid stenosis measurements (when applicable) are obtained utilizing NASCET criteria, using the distal internal carotid diameter as the denominator. CONTRAST:  9 mL Gadavist COMPARISON:  CT head without  contrast 08/18/2018 FINDINGS: MRI HEAD FINDINGS Brain: The diffusion-weighted images confirm an acute/subacute nonhemorrhagic infarct in the superior posterior left lentiform nucleus. Subtle T2 signal changes are present in the same area. Minimal subcortical white-matter changes otherwise are likely within normal limits for age. Mild atrophy is within normal limits for age. The ventricles are of proportionate to the degree of atrophy. No significant extraaxial fluid collection is present. The internal auditory canals are within normal limits. The brainstem and cerebellum are within normal limits. Vascular: Flow is present in the major intracranial arteries. Skull and upper cervical spine: The craniocervical junction is normal. Upper cervical spine is within normal limits. Marrow signal is unremarkable. Sinuses/Orbits: Minimal mucosal thickening is present along the floor of the maxillary sinuses bilaterally. No fluid levels are present. The paranasal sinuses and mastoid air cells are otherwise clear. Bilateral lens replacements are noted. Globes and orbits are otherwise unremarkable. MRA HEAD FINDINGS Time-of-flight images demonstrate normal appearance of the internal carotid arteries from the high cervical segments through the ICA termini bilaterally. The left A1 is highly stenotic or occluded. Both ACA vessels fill from the right via a patent anterior communicating artery. Signal in the left A2 segment is less robust than on the right. M1 segments are normal bilaterally. MCA bifurcations are intact. MCA branch vessels are normal bilaterally. The left vertebral artery is the dominant vessel. The right vertebral artery terminates at the PICA. Basilar artery is normal. The right posterior cerebral artery originates from basilar tip. The left posterior cerebral artery is of fetal type. A small left P1 segment is not present. PCA branch vessels are normal bilaterally. MRA NECK FINDINGS The time-of-flight images  demonstrate no significant flow disturbance in either carotid artery bifurcation. Postcontrast images demonstrate a 3 vessel arch configuration. Great vessel origins are within normal limits. The right common carotid artery is normal. Bifurcation is unremarkable. The cervical right ICA is normal. The left common carotid artery is within normal limits. Bifurcation is within normal limits. The left ICA is normal. The left vertebral artery is the dominant vessel. The right vertebral artery is hypoplastic. Flow is antegrade in both vertebral arteries. IMPRESSION: 1. Acute nonhemorrhagic infarct involving the posterior left lentiform nucleus measuring up to 1.5 cm. 2. MR brain otherwise normal for age. 3. Occluded proximal left A1 segment. 4. Both ACA vessels fill from the right with asymmetric flow to the left ACA. 5. Normal MRA of the neck. Electronically Signed   By: San Morelle M.D.   On: 08/18/2018 15:05   Mr Chad Norris Neck W Wo Contrast  Result Date: 08/18/2018 CLINICAL DATA:  Acute onset of slurred speech and aphasia beginning at 2 p.m. yesterday. Patient describes some generalized weakness over the last 6 months. Acute headache beginning 2 days ago. EXAM: MRI HEAD WITHOUT AND WITH CONTRAST MRA HEAD WITHOUT CONTRAST MRA NECK WITHOUT AND WITH CONTRAST TECHNIQUE: Multiplanar, multiecho pulse sequences of the brain and surrounding structures were obtained without and with intravenous contrast. Angiographic images of the Circle of Willis were obtained using MRA  technique without intravenous contrast. Angiographic images of the neck were obtained using MRA technique without and with intravenous contrast. Carotid stenosis measurements (when applicable) are obtained utilizing NASCET criteria, using the distal internal carotid diameter as the denominator. CONTRAST:  9 mL Gadavist COMPARISON:  CT head without contrast 08/18/2018 FINDINGS: MRI HEAD FINDINGS Brain: The diffusion-weighted images confirm an  acute/subacute nonhemorrhagic infarct in the superior posterior left lentiform nucleus. Subtle T2 signal changes are present in the same area. Minimal subcortical white-matter changes otherwise are likely within normal limits for age. Mild atrophy is within normal limits for age. The ventricles are of proportionate to the degree of atrophy. No significant extraaxial fluid collection is present. The internal auditory canals are within normal limits. The brainstem and cerebellum are within normal limits. Vascular: Flow is present in the major intracranial arteries. Skull and upper cervical spine: The craniocervical junction is normal. Upper cervical spine is within normal limits. Marrow signal is unremarkable. Sinuses/Orbits: Minimal mucosal thickening is present along the floor of the maxillary sinuses bilaterally. No fluid levels are present. The paranasal sinuses and mastoid air cells are otherwise clear. Bilateral lens replacements are noted. Globes and orbits are otherwise unremarkable. MRA HEAD FINDINGS Time-of-flight images demonstrate normal appearance of the internal carotid arteries from the high cervical segments through the ICA termini bilaterally. The left A1 is highly stenotic or occluded. Both ACA vessels fill from the right via a patent anterior communicating artery. Signal in the left A2 segment is less robust than on the right. M1 segments are normal bilaterally. MCA bifurcations are intact. MCA branch vessels are normal bilaterally. The left vertebral artery is the dominant vessel. The right vertebral artery terminates at the PICA. Basilar artery is normal. The right posterior cerebral artery originates from basilar tip. The left posterior cerebral artery is of fetal type. A small left P1 segment is not present. PCA branch vessels are normal bilaterally. MRA NECK FINDINGS The time-of-flight images demonstrate no significant flow disturbance in either carotid artery bifurcation. Postcontrast images  demonstrate a 3 vessel arch configuration. Great vessel origins are within normal limits. The right common carotid artery is normal. Bifurcation is unremarkable. The cervical right ICA is normal. The left common carotid artery is within normal limits. Bifurcation is within normal limits. The left ICA is normal. The left vertebral artery is the dominant vessel. The right vertebral artery is hypoplastic. Flow is antegrade in both vertebral arteries. IMPRESSION: 1. Acute nonhemorrhagic infarct involving the posterior left lentiform nucleus measuring up to 1.5 cm. 2. MR brain otherwise normal for age. 3. Occluded proximal left A1 segment. 4. Both ACA vessels fill from the right with asymmetric flow to the left ACA. 5. Normal MRA of the neck. Electronically Signed   By: San Morelle M.D.   On: 08/18/2018 15:05   Mr Brain Wo Contrast (neuro Protocol)  Result Date: 08/18/2018 CLINICAL DATA:  Acute onset of slurred speech and aphasia beginning at 2 p.m. yesterday. Patient describes some generalized weakness over the last 6 months. Acute headache beginning 2 days ago. EXAM: MRI HEAD WITHOUT AND WITH CONTRAST MRA HEAD WITHOUT CONTRAST MRA NECK WITHOUT AND WITH CONTRAST TECHNIQUE: Multiplanar, multiecho pulse sequences of the brain and surrounding structures were obtained without and with intravenous contrast. Angiographic images of the Circle of Willis were obtained using MRA technique without intravenous contrast. Angiographic images of the neck were obtained using MRA technique without and with intravenous contrast. Carotid stenosis measurements (when applicable) are obtained utilizing NASCET criteria, using the  distal internal carotid diameter as the denominator. CONTRAST:  9 mL Gadavist COMPARISON:  CT head without contrast 08/18/2018 FINDINGS: MRI HEAD FINDINGS Brain: The diffusion-weighted images confirm an acute/subacute nonhemorrhagic infarct in the superior posterior left lentiform nucleus. Subtle T2  signal changes are present in the same area. Minimal subcortical white-matter changes otherwise are likely within normal limits for age. Mild atrophy is within normal limits for age. The ventricles are of proportionate to the degree of atrophy. No significant extraaxial fluid collection is present. The internal auditory canals are within normal limits. The brainstem and cerebellum are within normal limits. Vascular: Flow is present in the major intracranial arteries. Skull and upper cervical spine: The craniocervical junction is normal. Upper cervical spine is within normal limits. Marrow signal is unremarkable. Sinuses/Orbits: Minimal mucosal thickening is present along the floor of the maxillary sinuses bilaterally. No fluid levels are present. The paranasal sinuses and mastoid air cells are otherwise clear. Bilateral lens replacements are noted. Globes and orbits are otherwise unremarkable. MRA HEAD FINDINGS Time-of-flight images demonstrate normal appearance of the internal carotid arteries from the high cervical segments through the ICA termini bilaterally. The left A1 is highly stenotic or occluded. Both ACA vessels fill from the right via a patent anterior communicating artery. Signal in the left A2 segment is less robust than on the right. M1 segments are normal bilaterally. MCA bifurcations are intact. MCA branch vessels are normal bilaterally. The left vertebral artery is the dominant vessel. The right vertebral artery terminates at the PICA. Basilar artery is normal. The right posterior cerebral artery originates from basilar tip. The left posterior cerebral artery is of fetal type. A small left P1 segment is not present. PCA branch vessels are normal bilaterally. MRA NECK FINDINGS The time-of-flight images demonstrate no significant flow disturbance in either carotid artery bifurcation. Postcontrast images demonstrate a 3 vessel arch configuration. Great vessel origins are within normal limits. The right  common carotid artery is normal. Bifurcation is unremarkable. The cervical right ICA is normal. The left common carotid artery is within normal limits. Bifurcation is within normal limits. The left ICA is normal. The left vertebral artery is the dominant vessel. The right vertebral artery is hypoplastic. Flow is antegrade in both vertebral arteries. IMPRESSION: 1. Acute nonhemorrhagic infarct involving the posterior left lentiform nucleus measuring up to 1.5 cm. 2. MR brain otherwise normal for age. 3. Occluded proximal left A1 segment. 4. Both ACA vessels fill from the right with asymmetric flow to the left ACA. 5. Normal MRA of the neck. Electronically Signed   By: San Morelle M.D.   On: 08/18/2018 15:05    PHYSICAL EXAM Constitutional: Appears well-developed and well-nourished.  Psych: Affect appropriate to situation Eyes: No scleral injection HENT: No OP obstrucion Head: Normocephalic.  Cardiovascular: Normal rate and regular rhythm.  Respiratory: Effort normal, non-labored breathing GI: Soft.  No distension. There is no tenderness.  Skin: WDI  Neuro: Mental Status: Patient is awake, alert, oriented to person, place, month, year, and situation. Patient is able to give a clear and coherent history. No signs of aphasia or neglect Mild dysarthria Cranial Nerves: II: Visual Fields are full. Pupils are equal, round, and reactive to light.   III,IV, VI: EOMI with mild L eye ptosis, no diploplia.  V: Facial sensation is symmetric to temperature VII: Mild decreased R NL fold VIII: hearing is intact to voice X: Uvula elevates symmetrically XI: Shoulder shrug is symmetric. XII: tongue is midline without atrophy or fasciculations.  Motor:  Tone is normal. Bulk is normal. 5/5 strength was present in all four extremities.  Sensory: Sensation is symmetric to light touch and temperature in the arms and legs. Cerebellar: FNF intact bilaterally   ASSESSMENT/PLAN [name] is  a 85 y.o. male with history of HLD presenting with slurred speech.   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 Old L lenticular nucleus infarct vs chronic microvascular changes. Atrophy.  MRI  Acute L lentiform nucleus infarct  MRA head Occluded L A1. Both ACAs fill from the R, decreased flow L ACA  MRA neck  normal  2D Echo  pending   LDL 135  HgbA1c 5.8  Lovenox 40 mg sq daily for VTE prophylaxis  No antithrombotic prior to admission, now on aspirin 81 mg daily and clopidogrel 75 mg daily following load. Continue DAPT x 3 weeks then aspirin alone.   Therapy recommendations:  pending - anticipate no therapy needs  Disposition:  pending   OP sleep study recommended for evaluation of snoring. I referred to [facility]. They will call with appt at d/c  Follow up [place] stroke clinic in 4 weeks, order placed  [facility] for d/c once 2D resulted  Hyperlipidemia  Home meds:  None  LDL 135, goal < 70  Now on lipitor 40  Heart healthy diet recommended and discussed  Continue statin at discharge  Other Stroke Risk Factors  Advanced age  ETOH use, advised to drink no more than 2 drink(s) a day  Possible obstructive sleep apnea, snores at hs  Hospital day # 0  Burnetta Sabin, MSN, APRN, ANVP-BC, AGPCNP-BC Advanced Practice Stroke Nurse [place] for Schedule & Pager information 08/19/2018 2:23 PM   ATTENDING NOTE: I reviewed above note and agree with the assessment and plan. Pt was seen and examined.   85 year old male with history of hypertension, hyperlipidemia admitted for slurred speech and intermittent generalized weakness and blurry vision.  CT and MRI showed left BG/CR infarct.  MRA head and neck showed left A1 and left P2 stenosis.  EF 50 to 55%.  UDS negative.  LDL 135 and A1c 5.8.  Creatinine 1.35.  Patient neurological examination showed mild dysarthria otherwise neurologically  intact.  His stroke likely due to small vessel disease given risk factors of hypertension and hyperlipidemia.  Recommend aspirin 81 and Plavix 75 for 3 weeks and then aspirin alone.  Continue Lipitor for stroke prevention.  Stroke risk factor modification.  PT/OT no recommendations.  Neurology will sign off. Please call with questions. Pt will follow up with stroke clinic NP at [facility] in about 4 weeks. Thanks for the consult.   Rosalin Hawking, MD PhD Stroke Neurology 08/19/2018 5:49 PM     To contact Stroke Continuity provider, please refer to [email or web address] After hours, contact General Neurology

## 2018-08-19 NOTE — Evaluation (Signed)
Occupational Therapy Evaluation Patient Details Name: Chad Norris MRN: [id number] DOB: [birth year] Today's Date: 08/19/2018    History of Present Illness 85 y.o. male with history of prediabetes, prostate CA, hx of cervical spine surgery presents to the hospital with family with complaints of slurred speech. MRI showed Acute nonhemorrhagic infarct involving the posterior left lentiform nucleus.    Clinical Impression   This 85 y/o male presents with the above. At baseline pt reports he is independent with ADL and functional mobility. Pt presents supine in bed pleasant and willing to participate in therapy session. Pt performing room level functional mobility this session without AD and overall minguard assist; mild unsteadiness noted with initial mobility but improved throughout session completion and with no overt LOB. Pt completing LB ADL, standing grooming and toileting ADL with overall supervision-minguard assist throughout. Pt reports feeling at his baseline with ADL and mobility completion; family present during session and in agreement. Educated/reviewed with pt and pt's family signs/symptoms of stroke including BEFAST acronym with pt verbalizing understanding. Pt eager to discharge home; will continue to follow while he remains in acute setting to maximize his overall safety and independence with ADL and mobility, do not anticipate pt will require follow up OT services.     Follow Up Recommendations  No OT follow up;Supervision/Assistance - 24 hour(24hr initially)    Equipment Recommendations  None recommended by OT           Precautions / Restrictions Precautions Precautions: Fall Restrictions Weight Bearing Restrictions: No      Mobility Bed Mobility Overal bed mobility: Needs Assistance Bed Mobility: Supine to Sit     Supine to sit: Supervision     General bed mobility comments: for general safety, HOB elevated  Transfers Overall transfer level: Needs  assistance Equipment used: None Transfers: Sit to/from Stand Sit to Stand: Min guard         General transfer comment: for general safety and immediate standing balance    Balance Overall balance assessment: Mild deficits observed, not formally tested                                         ADL either performed or assessed with clinical judgement   ADL Overall ADL's : Needs assistance/impaired Eating/Feeding: Modified independent;Sitting   Grooming: Min guard;Supervision/safety;Wash/dry hands;Oral care;Standing   Upper Body Bathing: Supervision/ safety;Sitting   Lower Body Bathing: Min guard;Sit to/from stand   Upper Body Dressing : Set up;Sitting   Lower Body Dressing: Min guard;Sit to/from stand Lower Body Dressing Details (indicate cue type and reason): pt donning socks seated EOB without difficulty; minguard for standing balance Toilet Transfer: Min guard;Ambulation Toilet Transfer Details (indicate cue type and reason): simulated in transfer to/from [place] and Hygiene: Supervision/safety;Sit to/from stand Toileting - Clothing Manipulation Details (indicate cue type and reason): pt standing to void bladder in bathroom during session; supervision for safety     Functional mobility during ADLs: Min guard       Vision Baseline Vision/History: Wears glasses Wears Glasses: Reading only Patient Visual Report: Blurring of vision(reports has subsided since intial admission) Vision Assessment?: Yes Eye Alignment: Within Functional Limits Ocular Range of Motion: Within Functional Limits Alignment/Gaze Preference: Within Defined Limits Tracking/Visual Pursuits: Able to track stimulus in all quads without difficulty Visual Fields: No apparent deficits     Perception     Praxis  Pertinent Vitals/Pain Pain Assessment: No/denies pain     Hand Dominance Right   Extremity/Trunk Assessment Upper Extremity  Assessment Upper Extremity Assessment: Overall WFL for tasks assessed(mild fine motor deficits noted )   Lower Extremity Assessment Lower Extremity Assessment: Defer to PT evaluation       Communication Communication Communication: HOH   Cognition Arousal/Alertness: Awake/alert Behavior During Therapy: WFL for tasks assessed/performed Overall Cognitive Status: Within Functional Limits for tasks assessed                                     General Comments  pt's spouse and son present during session; educated pt and family on signs/symptoms of stroke including BEFAST acronym    Exercises     Shoulder Instructions      Home Living Family/patient expects to be discharged to:: Private residence Living Arrangements: Spouse/significant other Available Help at Discharge: Family Type of Home: House Home Access: Stairs to enter Technical brewer of Steps: 3 Entrance Stairs-Rails: Right Home Layout: Two level;Able to live on main level with bedroom/bathroom Alternate Level Stairs-Number of Steps: 14 Alternate Level Stairs-Rails: Left Bathroom Shower/Tub: Tub/shower unit;Walk-in Psychologist, prison and probation services: Standard     Home Equipment: Shower seat - built in(in guest bathroom )   Additional Comments: pt's office on 2nd floor      Prior Functioning/Environment Level of Independence: Independent                 OT Problem List: Decreased strength;Decreased activity tolerance;Impaired balance (sitting and/or standing)      OT Treatment/Interventions: Self-care/ADL training;Therapeutic exercise;Therapeutic activities;Visual/perceptual remediation/compensation;Patient/family education;Balance training    OT Goals(Current goals can be found in the care plan section) Acute Rehab OT Goals Patient Stated Goal: home today OT Goal Formulation: With patient Time For Goal Achievement: 09/02/18 Potential to Achieve Goals: Good  OT Frequency: Min 3X/week    Barriers to D/C:            Co-evaluation              AM-PAC OT "6 Clicks" Daily Activity     Outcome Measure Help from another person eating meals?: None Help from another person taking care of personal grooming?: None Help from another person toileting, which includes using toliet, bedpan, or urinal?: None Help from another person bathing (including washing, rinsing, drying)?: None Help from another person to put on and taking off regular upper body clothing?: None Help from another person to put on and taking off regular lower body clothing?: None 6 Click Score: 24   End of Session Equipment Utilized During Treatment: Gait belt Nurse Communication: Mobility status  Activity Tolerance: Patient tolerated treatment well Patient left: in bed;with family/visitor present;with call bell/phone within reach;with bed alarm set(sitting EOB)  OT Visit Diagnosis: Muscle weakness (generalized) (M62.81)                Time: 5102-5852 OT Time Calculation (min): 28 min Charges:  OT General Charges $OT Visit: 1 Visit OT Evaluation $OT Eval Moderate Complexity: 1 Mod OT Treatments $Self Care/Home Management : 8-22 mins  Lou Cal, OT [facility] Services Pager [phone] Office [phone]   Raymondo Band 08/19/2018, 10:31 AM

## 2018-08-19 NOTE — Evaluation (Signed)
Physical Therapy Evaluation Patient Details Name: Chad Norris MRN: [id number] DOB: 02/13/1932 Today's Date: 08/19/2018   History of Present Illness  85 y.o. male with history of prediabetes, prostate CA, hx of cervical spine surgery presents to the hospital with family with complaints of slurred speech. MRI showed Acute nonhemorrhagic infarct involving the posterior left lentiform nucleus.   Clinical Impression  Patient evaluated by Physical Therapy with no further acute PT needs identified. All education has been completed and the patient has no further questions. Pt ambulated >300' mod I level with good gait speed and ability to converse and perform head turns with no LOB. Ascended and descended flight of stairs with HR up to 130's but otherwise no difficulties. Discussed stroke healing and expected recovery.  See below for any follow-up Physical Therapy or equipment needs. PT is signing off. Thank you for this referral.     Follow Up Recommendations No PT follow up    Equipment Recommendations  None recommended by PT    Recommendations for Other Services       Precautions / Restrictions Precautions Precautions: Fall Restrictions Weight Bearing Restrictions: No      Mobility  Bed Mobility Overal bed mobility: Modified Independent Bed Mobility: Supine to Sit     Supine to sit: Modified independent (Device/Increase time)     General bed mobility comments: bed flat, no rail, no difficulties getting to edge  Transfers Overall transfer level: Modified independent Equipment used: None Transfers: Sit to/from Stand Sit to Stand: Modified independent (Device/Increase time)         General transfer comment: stood safely from bed  Ambulation/Gait Ambulation/Gait assistance: Modified independent (Device/Increase time) Gait Distance (Feet): 350 Feet Assistive device: None Gait Pattern/deviations: WFL(Within Functional Limits) Gait velocity: WFL Gait velocity  interpretation: >4.37 ft/sec, indicative of normal walking speed General Gait Details: increased pace as he kept going, good arm swing, good ability to converse and turn head side to side while ambulating  Stairs Stairs: Yes Stairs assistance: Modified independent (Device/Increase time) Stair Management: One rail Right;Alternating pattern;Forwards Number of Stairs: 10 General stair comments: HR up to 130's, pt asymptomatic, mildly winded  Wheelchair Mobility    Modified Rankin (Stroke Patients Only) Modified Rankin (Stroke Patients Only) Pre-Morbid Rankin Score: No symptoms Modified Rankin: No symptoms     Balance Overall balance assessment: Mild deficits observed, not formally tested                                           Pertinent Vitals/Pain Pain Assessment: No/denies pain    Home Living Family/patient expects to be discharged to:: Private residence Living Arrangements: Spouse/significant other Available Help at Discharge: Family Type of Home: House Home Access: Stairs to enter Entrance Stairs-Rails: Right Entrance Stairs-Number of Steps: 3 Home Layout: Two level;Able to live on main level with bedroom/bathroom Home Equipment: Shower seat - built in(in guest bathroom ) Additional Comments: pt's office on 2nd floor    Prior Function Level of Independence: Independent               Hand Dominance   Dominant Hand: Right    Extremity/Trunk Assessment   Upper Extremity Assessment Upper Extremity Assessment: Overall WFL for tasks assessed    Lower Extremity Assessment Lower Extremity Assessment: Overall WFL for tasks assessed    Cervical / Trunk Assessment Cervical / Trunk Assessment: Normal  Communication   Communication:  HOH  Cognition Arousal/Alertness: Awake/alert Behavior During Therapy: WFL for tasks assessed/performed Overall Cognitive Status: Within Functional Limits for tasks assessed                                         General Comments General comments (skin integrity, edema, etc.): discussed brain healing and recovery time    Exercises     Assessment/Plan    PT Assessment Patent does not need any further PT services  PT Problem List         PT Treatment Interventions      PT Goals (Current goals can be found in the Care Plan section)  Acute Rehab PT Goals Patient Stated Goal: home today PT Goal Formulation: All assessment and education complete, DC therapy    Frequency     Barriers to discharge        Co-evaluation               AM-PAC PT "6 Clicks" Mobility  Outcome Measure Help needed turning from your back to your side while in a flat bed without using bedrails?: None Help needed moving from lying on your back to sitting on the side of a flat bed without using bedrails?: None Help needed moving to and from a bed to a chair (including a wheelchair)?: None Help needed standing up from a chair using your arms (e.g., wheelchair or bedside chair)?: None Help needed to walk in hospital room?: None Help needed climbing 3-5 steps with a railing? : None 6 Click Score: 24    End of Session Equipment Utilized During Treatment: Gait belt Activity Tolerance: Patient tolerated treatment well Patient left: in bed;with call bell/phone within reach;with family/visitor present Nurse Communication: Mobility status PT Visit Diagnosis: Unsteadiness on feet (R26.81)    Time: 0539-7673 PT Time Calculation (min) (ACUTE ONLY): 30 min   Charges:   PT Evaluation $PT Eval Moderate Complexity: 1 Mod PT Treatments $Gait Training: 8-22 mins        Leighton Roach, [place]  Pager [phone] Office [place] 08/19/2018, 3:25 PM

## 2018-08-19 NOTE — Progress Notes (Signed)
  Echocardiogram 2D Echocardiogram has been performed.  Chad Norris 08/19/2018, 3:47 PM

## 2018-08-19 NOTE — Evaluation (Signed)
Speech Language Pathology Evaluation Patient Details Name: Chad Norris MRN: [id number] DOB: 05/17/1932 Today's Date: 08/19/2018 Time: 7591-6384 SLP Time Calculation (min) (ACUTE ONLY): 24 min  Problem List:  Patient Active Problem List   Diagnosis Date Noted  . Acute CVA (cerebrovascular accident) ([place]) 08/19/2018  . Acute ischemic stroke ([place]) 08/18/2018  . Stroke ([place]) 08/18/2018  . Sore throat 06/25/2018  . Well adult exam 03/27/2018  . Hyperlipidemia 03/27/2018  . Basal cell carcinoma (BCC) of [place] 03/11/2017  . Prediabetes 03/11/2017  . DDD (degenerative disc disease), lumbosacral 01/13/2014  . NONSPECIFIC ABNORMAL ELECTROCARDIOGRAM 06/21/2008  . PROSTATE CANCER, HX OF 06/21/2008  . COLONIC POLYPS, HX OF 06/21/2008  . DIVERTICULOSIS, COLON 05/07/2008  . NEPHROLITHIASIS, HX OF 05/07/2008  . History of gout 10/09/2007   Past Medical History:  Past Medical History:  Diagnosis Date  . Colon polyps   . Diverticulosis   . Gout   . Hemorrhoids   . Hyperlipemia   . Nephrolithiasis   . Prostate cancer [facility])    Past Surgical History:  Past Surgical History:  Procedure Laterality Date  . CATARACT EXTRACTION W/ INTRAOCULAR LENS  IMPLANT, BILATERAL     bi-lat  . CERVICAL SPINE SURGERY    . COLONOSCOPY W/ POLYPECTOMY    . Epidural steroid injections     3-4, Dr Ellene Route  . RETROPUBIC PROSTATECTOMY    . SHOULDER SURGERY     left   HPI:  85 y.o. male with history of prediabetes, prostate CA, hx of cervical spine surgery presents to the hospital with family with complaints of slurred speech. MRI showed Acute nonhemorrhagic infarct involving the posterior left lentiform nucleus.    Assessment / Plan / Recommendation Clinical Impression   Pt presents with mild, intermittently decreased speech intelligibility in the setting of imprecise articulation of consonants and decreased vocal intensity.  Pt is mod I for compensating for these moments of decreased intelligibility. Pt  and family endorse that pt is otherwise back to baseline for cognitive-linguistic function.  SLP provided skilled education regarding speech intelligibility strategies.  Pt and family are all in agreement with no further ST needs at this time; however, SLP did make pt aware that OP ST services are available should he have ongoing complaints regarding his speech once discharged home.  All questions were answered to pt's and family's satisfaction at this time.    SLP Assessment  SLP Recommendation/Assessment: Patient does not need any further Speech Lanaguage Pathology Services SLP Visit Diagnosis: Dysarthria and anarthria (R47.1)    Follow Up Recommendations  None          SLP Evaluation Cognition  Overall Cognitive Status: Within Functional Limits for tasks assessed       Comprehension  Auditory Comprehension Overall Auditory Comprehension: Appears within functional limits for tasks assessed    Expression Expression Primary Mode of Expression: Verbal Verbal Expression Overall Verbal Expression: Appears within functional limits for tasks assessed Written Expression Dominant Hand: Right   Oral / Motor  Oral Motor/Sensory Function Overall Oral Motor/Sensory Function: Within functional limits Motor Speech Overall Motor Speech: Appears within functional limits for tasks assessed   GO                    Chad Norris 08/19/2018, 2:31 PM

## 2018-08-19 NOTE — Discharge Summary (Signed)
Physician Discharge Summary  Chad Norris FBP:[id number] DOB: Aug 31, 1931  PCP: Luetta Nutting, DO  Admit date: 08/18/2018 Discharge date: 08/19/2018  Recommendations for Outpatient Follow-up:  1. Dr. Luetta Nutting, PCP in 1 week. 2. [facility] in 4 weeks 3. [place] [facility]: Office will call to arrange sleep study appointment.  Home Health: None Equipment/Devices: None  Discharge Condition: Improved and stable CODE STATUS: Full Diet recommendation: Heart healthy diet.  Discharge Diagnoses:  Principal Problem:   Acute ischemic stroke [facility]) Active Problems:   Prediabetes   Stroke [facility])   Acute CVA (cerebrovascular accident) [facility])   Brief Summary: 85 year old male, lives with spouse, independent, PMH of prediabetes, hard of hearing, HLD presented to the ED due to slurred speech that started day prior to hospital admission.  He was admitted for acute stroke.  Neurology was consulted.  Assessment and plan:  Acute stroke Resultant dysarthria. CT head 1/27: Chronic microvascular changes versus small infarct of indeterminate age posterior superior left lenticular nucleus. MRI head: Acute nonhemorrhagic infarct involving the posterior left lentiform nucleus measuring up to 1.5 cm.  Occluded proximal left A1 segment. MRA neck: Normal. TTE: LVEF 50-55% LDL 135 A1c 5.8 Neurology consulted and assisted with evaluation and management. Etiology of stroke: Likely due to small vessel disease. Not on antithrombotics prior to admission, now on aspirin 81 mg daily and Plavix 75 mg daily for 3 weeks followed by aspirin alone. Therapies evaluated and have no home recommendations. Patient reported history of snoring and neurology have referred to outpatient sleep center for sleep study. Outpatient neurology follow-up in 4 weeks.  Hyperlipidemia  LDL 135, goal <70.  Atorvastatin 40 mg daily initiated.  Outpatient  follow-up.   Consultations:  Neurology  Procedures:  None   Discharge Instructions  Discharge Instructions    Ambulatory referral to Neurology   Complete by:  As directed    Follow up with stroke clinic NP (Jessica Vanschaick or Cecille Rubin, if both not available, consider Dr. Antony Contras, Dr. Bess Harvest, or Dr. Sarina Ill) at [facility] in about 4 weeks.   Ambulatory referral to Sleep Studies   Complete by:  As directed    Patient with stroke. Has witnessed sleep apnea in the hospital. Consider for sleep study   Call MD for:   Complete by:  As directed    Recurrent or worsening strokelike symptoms.   Diet - low sodium heart healthy   Complete by:  As directed    Increase activity slowly   Complete by:  As directed        Medication List    TAKE these medications   ADULT ONE DAILY GUMMIES Chew Chew 3 each by mouth daily. men   aspirin 81 MG chewable tablet Chew 1 tablet (81 mg total) by mouth daily. Start taking on:  August 20, 2018   atorvastatin 40 MG tablet Commonly known as:  LIPITOR Take 1 tablet (40 mg total) by mouth daily at 6 PM.   clopidogrel 75 MG tablet Commonly known as:  PLAVIX Take 1 tablet (75 mg total) by mouth daily for 20 days. Start taking on:  August 20, 2018   ibuprofen 200 MG tablet Commonly known as:  ADVIL,MOTRIN Take 400 mg by mouth every 6 (six) hours as needed for moderate pain.      Follow-up Information    [name] Follow up in 4 week(s).   Specialty:  Neurology Why:  stroke clinic. office will call  wtih appt date and time. Contact information: [facility] Follow up.   Specialty:  Sleep Medicine Why:  for sleep study.will call for appt. Contact information: [address] [place] [phone]        Luetta Nutting, DO. Schedule an appointment as soon as possible for a visit in 1 week(s).   Specialty:  Family Medicine Contact information: [place] [address] [phone]          No Known Allergies    Procedures/Studies: Ct Head Wo Contrast  Result Date: 08/18/2018 CLINICAL DATA:  85 year old male with generalized weakness for the past 6 months. Headache 2 days ago. Yesterday slurred speech and aphasia. Initial encounter. EXAM: CT HEAD WITHOUT CONTRAST TECHNIQUE: Contiguous axial images were obtained from the base of the skull through the vertex without intravenous contrast. COMPARISON:  No comparison head CT. FINDINGS: Brain: No intracranial hemorrhage. Chronic microvascular changes versus small infarct of indeterminate age posterior superior left lenticular nucleus. Mild global atrophy most notable parietal lobes. No intracranial mass lesion noted on this unenhanced exam. Vascular: Atherosclerotic changes without acute hyperdense vessel. Skull: No acute abnormality. Sinuses/Orbits: Post lens replacement. No acute orbital abnormality. Mucosal thickening inferior maxillary sinuses. Partial opacification superior left pterygoid plate region. Other: Mastoid air cells and middle ear cavities are clear. IMPRESSION: 1. No intracranial hemorrhage. 2. Chronic microvascular changes versus small infarct of indeterminate age posterior superior left lenticular nucleus. 3. Mild global atrophy most notable parietal lobes. Electronically Signed   By: Genia Del M.D.   On: 08/18/2018 12:02   [name] Head Wo Contrast  Result Date: 08/18/2018 CLINICAL DATA:  Acute onset of slurred speech and aphasia beginning at 2 p.m. yesterday. Patient describes some generalized weakness over the last 6 months. Acute headache beginning 2 days ago. EXAM: MRI HEAD WITHOUT AND WITH CONTRAST MRA HEAD WITHOUT CONTRAST MRA NECK WITHOUT AND WITH CONTRAST TECHNIQUE: Multiplanar, multiecho pulse  sequences of the brain and surrounding structures were obtained without and with intravenous contrast. Angiographic images of the Circle of Willis were obtained using MRA technique without intravenous contrast. Angiographic images of the neck were obtained using MRA technique without and with intravenous contrast. Carotid stenosis measurements (when applicable) are obtained utilizing NASCET criteria, using the distal internal carotid diameter as the denominator. CONTRAST:  9 mL Gadavist COMPARISON:  CT head without contrast 08/18/2018 FINDINGS: MRI HEAD FINDINGS Brain: The diffusion-weighted images confirm an acute/subacute nonhemorrhagic infarct in the superior posterior left lentiform nucleus. Subtle T2 signal changes are present in the same area. Minimal subcortical white-matter changes otherwise are likely within normal limits for age. Mild atrophy is within normal limits for age. The ventricles are of proportionate to the degree of atrophy. No significant extraaxial fluid collection is present. The internal auditory canals are within normal limits. The brainstem and cerebellum are within normal limits. Vascular: Flow is present in the major intracranial arteries. Skull and upper cervical spine: The craniocervical junction is normal. Upper cervical spine is within normal limits. Marrow signal is unremarkable. Sinuses/Orbits: Minimal mucosal thickening is present along the floor of the maxillary sinuses bilaterally. No fluid levels are present. The paranasal sinuses and mastoid air cells are otherwise clear. Bilateral lens replacements are noted. Globes and orbits are otherwise unremarkable. MRA HEAD FINDINGS Time-of-flight images demonstrate normal appearance of the internal carotid arteries from the high  cervical segments through the ICA termini bilaterally. The left A1 is highly stenotic or occluded. Both ACA vessels fill from the right via a patent anterior communicating artery. Signal in the left A2 segment  is less robust than on the right. M1 segments are normal bilaterally. MCA bifurcations are intact. MCA branch vessels are normal bilaterally. The left vertebral artery is the dominant vessel. The right vertebral artery terminates at the PICA. Basilar artery is normal. The right posterior cerebral artery originates from basilar tip. The left posterior cerebral artery is of fetal type. A small left P1 segment is not present. PCA branch vessels are normal bilaterally. MRA NECK FINDINGS The time-of-flight images demonstrate no significant flow disturbance in either carotid artery bifurcation. Postcontrast images demonstrate a 3 vessel arch configuration. Great vessel origins are within normal limits. The right common carotid artery is normal. Bifurcation is unremarkable. The cervical right ICA is normal. The left common carotid artery is within normal limits. Bifurcation is within normal limits. The left ICA is normal. The left vertebral artery is the dominant vessel. The right vertebral artery is hypoplastic. Flow is antegrade in both vertebral arteries. IMPRESSION: 1. Acute nonhemorrhagic infarct involving the posterior left lentiform nucleus measuring up to 1.5 cm. 2. MR brain otherwise normal for age. 3. Occluded proximal left A1 segment. 4. Both ACA vessels fill from the right with asymmetric flow to the left ACA. 5. Normal MRA of the neck. Electronically Signed   By: San Morelle M.D.   On: 08/18/2018 15:05   [name] Neck W Wo Contrast  Result Date: 08/18/2018 CLINICAL DATA:  Acute onset of slurred speech and aphasia beginning at 2 p.m. yesterday. Patient describes some generalized weakness over the last 6 months. Acute headache beginning 2 days ago. EXAM: MRI HEAD WITHOUT AND WITH CONTRAST MRA HEAD WITHOUT CONTRAST MRA NECK WITHOUT AND WITH CONTRAST TECHNIQUE: Multiplanar, multiecho pulse sequences of the brain and surrounding structures were obtained without and with intravenous contrast. Angiographic  images of the Circle of Willis were obtained using MRA technique without intravenous contrast. Angiographic images of the neck were obtained using MRA technique without and with intravenous contrast. Carotid stenosis measurements (when applicable) are obtained utilizing NASCET criteria, using the distal internal carotid diameter as the denominator. CONTRAST:  9 mL Gadavist COMPARISON:  CT head without contrast 08/18/2018 FINDINGS: MRI HEAD FINDINGS Brain: The diffusion-weighted images confirm an acute/subacute nonhemorrhagic infarct in the superior posterior left lentiform nucleus. Subtle T2 signal changes are present in the same area. Minimal subcortical white-matter changes otherwise are likely within normal limits for age. Mild atrophy is within normal limits for age. The ventricles are of proportionate to the degree of atrophy. No significant extraaxial fluid collection is present. The internal auditory canals are within normal limits. The brainstem and cerebellum are within normal limits. Vascular: Flow is present in the major intracranial arteries. Skull and upper cervical spine: The craniocervical junction is normal. Upper cervical spine is within normal limits. Marrow signal is unremarkable. Sinuses/Orbits: Minimal mucosal thickening is present along the floor of the maxillary sinuses bilaterally. No fluid levels are present. The paranasal sinuses and mastoid air cells are otherwise clear. Bilateral lens replacements are noted. Globes and orbits are otherwise unremarkable. MRA HEAD FINDINGS Time-of-flight images demonstrate normal appearance of the internal carotid arteries from the high cervical segments through the ICA termini bilaterally. The left A1 is highly stenotic or occluded. Both ACA vessels fill from the right via a patent anterior communicating artery. Signal in the  left A2 segment is less robust than on the right. M1 segments are normal bilaterally. MCA bifurcations are intact. MCA branch  vessels are normal bilaterally. The left vertebral artery is the dominant vessel. The right vertebral artery terminates at the PICA. Basilar artery is normal. The right posterior cerebral artery originates from basilar tip. The left posterior cerebral artery is of fetal type. A small left P1 segment is not present. PCA branch vessels are normal bilaterally. MRA NECK FINDINGS The time-of-flight images demonstrate no significant flow disturbance in either carotid artery bifurcation. Postcontrast images demonstrate a 3 vessel arch configuration. Great vessel origins are within normal limits. The right common carotid artery is normal. Bifurcation is unremarkable. The cervical right ICA is normal. The left common carotid artery is within normal limits. Bifurcation is within normal limits. The left ICA is normal. The left vertebral artery is the dominant vessel. The right vertebral artery is hypoplastic. Flow is antegrade in both vertebral arteries. IMPRESSION: 1. Acute nonhemorrhagic infarct involving the posterior left lentiform nucleus measuring up to 1.5 cm. 2. MR brain otherwise normal for age. 3. Occluded proximal left A1 segment. 4. Both ACA vessels fill from the right with asymmetric flow to the left ACA. 5. Normal MRA of the neck. Electronically Signed   By: San Morelle M.D.   On: 08/18/2018 15:05   Mr Brain Wo Contrast (neuro Protocol)  Result Date: 08/18/2018 CLINICAL DATA:  Acute onset of slurred speech and aphasia beginning at 2 p.m. yesterday. Patient describes some generalized weakness over the last 6 months. Acute headache beginning 2 days ago. EXAM: MRI HEAD WITHOUT AND WITH CONTRAST MRA HEAD WITHOUT CONTRAST MRA NECK WITHOUT AND WITH CONTRAST TECHNIQUE: Multiplanar, multiecho pulse sequences of the brain and surrounding structures were obtained without and with intravenous contrast. Angiographic images of the Circle of Willis were obtained using MRA technique without intravenous contrast.  Angiographic images of the neck were obtained using MRA technique without and with intravenous contrast. Carotid stenosis measurements (when applicable) are obtained utilizing NASCET criteria, using the distal internal carotid diameter as the denominator. CONTRAST:  9 mL Gadavist COMPARISON:  CT head without contrast 08/18/2018 FINDINGS: MRI HEAD FINDINGS Brain: The diffusion-weighted images confirm an acute/subacute nonhemorrhagic infarct in the superior posterior left lentiform nucleus. Subtle T2 signal changes are present in the same area. Minimal subcortical white-matter changes otherwise are likely within normal limits for age. Mild atrophy is within normal limits for age. The ventricles are of proportionate to the degree of atrophy. No significant extraaxial fluid collection is present. The internal auditory canals are within normal limits. The brainstem and cerebellum are within normal limits. Vascular: Flow is present in the major intracranial arteries. Skull and upper cervical spine: The craniocervical junction is normal. Upper cervical spine is within normal limits. Marrow signal is unremarkable. Sinuses/Orbits: Minimal mucosal thickening is present along the floor of the maxillary sinuses bilaterally. No fluid levels are present. The paranasal sinuses and mastoid air cells are otherwise clear. Bilateral lens replacements are noted. Globes and orbits are otherwise unremarkable. MRA HEAD FINDINGS Time-of-flight images demonstrate normal appearance of the internal carotid arteries from the high cervical segments through the ICA termini bilaterally. The left A1 is highly stenotic or occluded. Both ACA vessels fill from the right via a patent anterior communicating artery. Signal in the left A2 segment is less robust than on the right. M1 segments are normal bilaterally. MCA bifurcations are intact. MCA branch vessels are normal bilaterally. The left vertebral artery is the  dominant vessel. The right vertebral  artery terminates at the PICA. Basilar artery is normal. The right posterior cerebral artery originates from basilar tip. The left posterior cerebral artery is of fetal type. A small left P1 segment is not present. PCA branch vessels are normal bilaterally. MRA NECK FINDINGS The time-of-flight images demonstrate no significant flow disturbance in either carotid artery bifurcation. Postcontrast images demonstrate a 3 vessel arch configuration. Great vessel origins are within normal limits. The right common carotid artery is normal. Bifurcation is unremarkable. The cervical right ICA is normal. The left common carotid artery is within normal limits. Bifurcation is within normal limits. The left ICA is normal. The left vertebral artery is the dominant vessel. The right vertebral artery is hypoplastic. Flow is antegrade in both vertebral arteries. IMPRESSION: 1. Acute nonhemorrhagic infarct involving the posterior left lentiform nucleus measuring up to 1.5 cm. 2. MR brain otherwise normal for age. 3. Occluded proximal left A1 segment. 4. Both ACA vessels fill from the right with asymmetric flow to the left ACA. 5. Normal MRA of the neck. Electronically Signed   By: San Morelle M.D.   On: 08/18/2018 15:05      Subjective: Patient reports that he feels much better.  Indicates that his speech is almost back to normal.  Denies any other strokelike symptoms.  No headache or visual symptoms.  Discharge Exam:  Vitals:   08/19/18 0406 08/19/18 0932 08/19/18 1232 08/19/18 1542  BP: (!) 128/58 (!) 117/55 124/71 127/77  Pulse: (!) 56 (!) 53 62 (!) 54  Resp: (!) 21 (!) 22 18 20   Temp: 98.4 F (36.9 C) 98.7 F (37.1 C) 97.8 F (36.6 C) 98.4 F (36.9 C)  TempSrc: Oral Oral Oral Oral  SpO2: 99% 96% 95% 95%  Weight:      Height:        General: Pleasant elderly male, moderately built and nourished sitting up comfortably in bed. Cardiovascular: S1 & S2 heard, RRR, S1/S2 +. No murmurs, rubs, gallops or  clicks. No JVD or pedal edema.  Telemetry personally reviewed: SB in the 50s-SR, BBB morphology. Respiratory: Clear to auscultation without wheezing, rhonchi or crackles. No increased work of breathing. Abdominal:  Non distended, non tender & soft. No organomegaly or masses appreciated. Normal bowel sounds heard. CNS: Alert and oriented.  May be mild dysarthria but otherwise no focal neurological deficits.  Hard of hearing for which he uses hearing aids. Extremities: no edema, no cyanosis.  Symmetric 5 x 5 power.  No pronator drift.  Right-handed.    The results of significant diagnostics from this hospitalization (including imaging, microbiology, ancillary and laboratory) are listed below for reference.      Labs: CBC: Recent Labs  Lab 08/18/18 1031  WBC 5.7  NEUTROABS 2.3  HGB 14.2  HCT 42.6  MCV 93.8  PLT 790   Basic Metabolic Panel: Recent Labs  Lab 08/18/18 1031 08/19/18 0743  NA 137 136  K 4.1 4.1  CL 105 102  CO2 23 23  GLUCOSE 128* 113*  BUN 17 17  CREATININE 1.35* 1.15  CALCIUM 9.3 8.6*   Liver Function Tests: Recent Labs  Lab 08/18/18 1031  AST 26  ALT 27  ALKPHOS 35*  BILITOT 0.9  PROT 6.9  ALBUMIN 4.0    Hgb A1c Recent Labs    08/19/18 0212  HGBA1C 5.8*   Lipid Profile Recent Labs    08/19/18 0212  CHOL 197  HDL 36*  LDLCALC 135*  TRIG 131  CHOLHDL 5.5   Urinalysis    Component Value Date/Time   COLORURINE STRAW (A) 08/18/2018 1156   APPEARANCEUR CLEAR 08/18/2018 1156   LABSPEC 1.006 08/18/2018 1156   PHURINE 5.0 08/18/2018 1156   GLUCOSEU NEGATIVE 08/18/2018 1156   HGBUR NEGATIVE 08/18/2018 1156   HGBUR negative 06/21/2008 0000   BILIRUBINUR NEGATIVE 08/18/2018 1156   BILIRUBINUR neg 05/26/2015 1138   [place] 08/18/2018 1156   PROTEINUR NEGATIVE 08/18/2018 1156   UROBILINOGEN 0.2 05/26/2015 1138   UROBILINOGEN negative 06/21/2008 0000   NITRITE NEGATIVE 08/18/2018 1156   LEUKOCYTESUR NEGATIVE 08/18/2018 1156     I discussed in detail with patient's son, updated care and answered questions.  Time coordinating discharge: 40 minutes  SIGNED:  Vernell Leep, MD, FACP, [facility]. [facility]  To contact the attending provider between 7A-7P or the covering provider during after hours 7P-7A, please log into the web site [email or web address] and access using universal [place] password for that web site. If you do not have the password, please call the hospital operator.

## 2018-08-19 NOTE — Discharge Instructions (Signed)

## 2018-08-19 NOTE — Progress Notes (Signed)
NURSING PROGRESS NOTE  Chad Norris [id number] Discharge Data: 08/19/2018 6:26 PM Attending Provider: Modena Jansky, MD BWL:[id number], Einar Pheasant, DO     Jerolyn Shin to be D/C'd Home per MD order.  Discussed with the patient the After Visit Summary and all questions fully answered. All IV's discontinued with no bleeding noted. All belongings returned to patient for patient to take home.   Last Vital Signs:  Blood pressure 127/77, pulse (!) 54, temperature 98.4 F (36.9 C), temperature source Oral, resp. rate 20, height 6' (1.829 m), weight 95.3 kg, SpO2 95 %.  Discharge Medication List Allergies as of 08/19/2018   No Known Allergies     Medication List    TAKE these medications   ADULT ONE DAILY GUMMIES Chew Chew 3 each by mouth daily. men   aspirin 81 MG chewable tablet Chew 1 tablet (81 mg total) by mouth daily. Start taking on:  August 20, 2018   atorvastatin 40 MG tablet Commonly known as:  LIPITOR Take 1 tablet (40 mg total) by mouth daily at 6 PM.   clopidogrel 75 MG tablet Commonly known as:  PLAVIX Take 1 tablet (75 mg total) by mouth daily for 20 days. Start taking on:  August 20, 2018   ibuprofen 200 MG tablet Commonly known as:  ADVIL,MOTRIN Take 400 mg by mouth every 6 (six) hours as needed for moderate pain.

## 2018-08-20 ENCOUNTER — Telehealth: Payer: Self-pay | Admitting: Behavioral Health

## 2018-08-20 NOTE — Telephone Encounter (Signed)
Attempted to reach patient for TCM/Hospital Follow-up call. Per recording, the call cannot be completed at this time.

## 2018-08-20 NOTE — Progress Notes (Signed)
1/29/2020d:: Pt discharged home with self care late yesterday. No f/u per PT/OT and no DME needs. Pt has hospital f/u and had transportation home.

## 2018-08-21 NOTE — Telephone Encounter (Signed)
Transition Care Management Follow-up Telephone Call  PCP: Luetta Nutting, DO  Admit date: 08/18/2018 Discharge date: 08/19/2018  Recommendations for Outpatient Follow-up:  1. Dr. Luetta Nutting, PCP in 1 week. 2. [facility] in 4 weeks 3. [place] [facility]: Office will call to arrange sleep study appointment.  Home Health: None Equipment/Devices: None  Discharge Condition: Improved and stable   How have you been since you were released from the hospital? Per patient's wife, "He had a left side stroke, but doing remarkably well" "It has affected his speech & occasionally have drooling, but still doing great".   Do you understand why you were in the hospital? yes   Do you understand the discharge instructions? yes   Where were you discharged to? Home with wife.   Items Reviewed:  Medications reviewed: yes  Allergies reviewed: yes  Dietary changes reviewed: yes, heart healthy diet; no sodium or red meat  Referrals reviewed: yes, follow-up with Dr. Luetta Nutting, PCP in 1 week; [facility] in 4 weeks   Functional Questionnaire:   Activities of Daily Living (ADLs):   He states they are independent in the following: ambulation, bathing and hygiene, feeding, continence, grooming, toileting and dressing States they require assistance with the following: None   Any transportation issues/concerns?: no   Any patient concerns? no   Confirmed importance and date/time of follow-up visits scheduled yes, 08/26/2018 at 11:15 AM.  Provider Appointment booked with Dr. Zigmund Daniel.  Confirmed with patient if condition begins to worsen call PCP or go to the ER.  Patient was given the office number and encouraged to call back with question or concerns.  : yes

## 2018-08-22 ENCOUNTER — Other Ambulatory Visit: Payer: Self-pay

## 2018-08-22 NOTE — Patient Outreach (Signed)
[facility]) Care Management  08/22/2018  Efton Thomley 1931-11-06 [id number]    EMMI-STROKE RED ON EMMI ALERT Day # 1 Date: 08/21/2018 Red Alert Reason: "Feeling worse overall? Yes"   Outreach attempt # 1 to patient. Spoke with patient who is HOH. Reviewed and addressed red alert. Patient reports that he has been doing well. He reports that he was having a hard time hearing automated call and responded in error. He denies any new and/or worsening symptoms. RN CM confirmed that patient has MD follow up appts in place and transportation. Patient confirmed that he has all his meds and no issues or concerns regarding them. He has supportive family to assist him. Advised patient that they would continue to get automated EMMI-Stroke post discharge calls to assess how they are doing following recent hospitalization and will receive a call from a nurse if any of their responses were abnormal. Patient voiced understanding and was appreciative of f/u call.   Plan: RN CM will close case as no further interventions needed at this time.  [name], RN,BSN,CCM [place] Management Telephonic Care Management Coordinator Direct Phone: [phone] Toll Free: [phone] Fax: [phone]

## 2018-08-26 ENCOUNTER — Ambulatory Visit (INDEPENDENT_AMBULATORY_CARE_PROVIDER_SITE_OTHER): Payer: Medicare Other | Admitting: Family Medicine

## 2018-08-26 ENCOUNTER — Encounter: Payer: Self-pay | Admitting: Family Medicine

## 2018-08-26 DIAGNOSIS — I639 Cerebral infarction, unspecified: Secondary | ICD-10-CM | POA: Diagnosis not present

## 2018-08-26 NOTE — Assessment & Plan Note (Signed)
-  Doing well, still with mild dizziness and fatigue -Recommend that he slowly ease back into normal activities until fatigue improves.  Limit roles in [place] and golf committee planning to more of an advisory role rather than lead role for the time being.  Limiting golf play to driving range or 9 holes on cool day.  -Recommend avoiding driving until dizziness resolves.  -Discussed that airplane travel should be ok in June but I would be more concerned about altitude in [place].  Recommend that he discuss further with neuro at upcoming appt.

## 2018-08-26 NOTE — Progress Notes (Signed)
Chad Norris - 85 y.o. male MRN [id number]  Date of birth: [birth year]  Subjective Chief Complaint  Patient presents with  . Hospitalization Follow-up    would like to discuss activites going forward-doing well-still fatigue at times.    HPI Chad Norris is a 84 y.o. male here today for hospital follow up. He was recently hospitalized for L lenticular stroke causing dysarthria.  Evaluated by neurology in the hospital has has f/u with them in a few weeks.  He has been compliant with plavix, asa, and lipitor.  Speech has improved but he still has mild dizziness at times and feels fatigued.  He denies weakness, numbness or tingling.  He is accompanied by his wife today who has several questions including can he return to playing golf, drive, participate on planning committee for their [facility] and travel to [place] in June.    ROS:  A comprehensive ROS was completed and negative except as noted per HPI  No Known Allergies  Past Medical History:  Diagnosis Date  . Colon polyps   . Diverticulosis   . Gout   . Hemorrhoids   . Hyperlipemia   . Nephrolithiasis   . Prostate cancer [facility])     Past Surgical History:  Procedure Laterality Date  . CATARACT EXTRACTION W/ INTRAOCULAR LENS  IMPLANT, BILATERAL     bi-lat  . CERVICAL SPINE SURGERY    . COLONOSCOPY W/ POLYPECTOMY    . Epidural steroid injections     3-4, Dr Ellene Route  . RETROPUBIC PROSTATECTOMY    . SHOULDER SURGERY     left    Social History   Socioeconomic History  . Marital status: Married    Spouse name: Not on file  . Number of children: 2  . Years of education: Not on file  . Highest education level: Not on file  Occupational History  . Occupation: retired  Scientific laboratory technician  . Financial resource strain: Not on file  . Food insecurity:    Worry: Not on file    Inability: Not on file  . Transportation needs:    Medical: Not on file    Non-medical: Not on file  Tobacco Use  . Smoking status: Never Smoker  . Smokeless  tobacco: Never Used  Substance and Sexual Activity  . Alcohol use: Yes    Alcohol/week: 1.0 standard drinks    Types: 1 Cans of beer per week  . Drug use: No  . Sexual activity: Not on file  Lifestyle  . Physical activity:    Days per week: Not on file    Minutes per session: Not on file  . Stress: Not on file  Relationships  . Social connections:    Talks on phone: Not on file    Gets together: Not on file    Attends religious service: Not on file    Active member of club or organization: Not on file    Attends meetings of clubs or organizations: Not on file    Relationship status: Not on file  Other Topics Concern  . Not on file  Social History Narrative  . Not on file    Family History  Problem Relation Age of Onset  . Kidney cancer Father   . Colon cancer Father   . Cancer Brother        ?  [name] Heart attack Brother 82  . Heart attack Sister 39  . Breast cancer Sister   . Diabetes Brother   . Alcohol abuse Brother   .  Colon polyps Brother     Health Maintenance  Topic Date Due  . FOOT EXAM  12/21/1943  . OPHTHALMOLOGY EXAM  12/21/1943  . URINE MICROALBUMIN  12/21/1943  . TETANUS/TDAP  12/20/1952  . HEMOGLOBIN A1C  02/17/2019  . INFLUENZA VACCINE  Completed  . PNA vac Low Risk Adult  Completed    ----------------------------------------------------------------------------------------------------------------------------------------------------------------------------------------------------------------- Physical Exam BP 121/60   Pulse 66   Temp 98.1 F (36.7 C) (Oral)   Ht 6' (1.829 m)   Wt 211 lb (95.7 kg)   SpO2 95%   BMI 28.62 kg/m   Physical Exam Constitutional:      Appearance: Normal appearance.  HENT:     Head: Normocephalic and atraumatic.     Mouth/Throat:     Mouth: Mucous membranes are moist.  Eyes:     General: No scleral icterus. Neck:     Musculoskeletal: Neck supple.  Cardiovascular:     Rate and Rhythm: Normal rate and regular  rhythm.  Pulmonary:     Effort: Pulmonary effort is normal.     Breath sounds: Normal breath sounds.  Skin:    General: Skin is warm and dry.  Neurological:     General: No focal deficit present.     Mental Status: He is alert.     Cranial Nerves: No cranial nerve deficit.     Sensory: No sensory deficit.     Motor: No weakness.     Coordination: Coordination normal.     Gait: Gait normal.     Comments: Hard of hearing   Psychiatric:        Mood and Affect: Mood normal.        Behavior: Behavior normal.     ------------------------------------------------------------------------------------------------------------------------------------------------------------------------------------------------------------------- Assessment and Plan  Acute CVA (cerebrovascular accident) ([place]) -Doing well, still with mild dizziness and fatigue -Recommend that he slowly ease back into normal activities until fatigue improves.  Limit roles in [place] and golf committee planning to more of an advisory role rather than lead role for the time being.  Limiting golf play to driving range or 9 holes on cool day.  -Recommend avoiding driving until dizziness resolves.  -Discussed that airplane travel should be ok in June but I would be more concerned about altitude in [place].  Recommend that he discuss further with neuro at upcoming appt.

## 2018-08-26 NOTE — Patient Instructions (Signed)
Ischemic Stroke    An ischemic stroke is the sudden death of brain tissue. Blood carries oxygen to all areas of the body. This type of stroke happens when your blood does not flow to your brain like normal. Your brain cannot get the oxygen it needs. This is an emergency. It must be treated right away.  Symptoms of a stroke usually happen all of a sudden. You may notice them when you wake up. They can include:  · Weakness or loss of feeling in your face, arm, or leg. This often happens on one side of the body.  · Trouble walking.  · Trouble moving your arms or legs.  · Loss of balance or coordination.  · Feeling confused.  · Trouble talking or understanding what people are saying.  · Slurred speech.  · Trouble seeing.  · Seeing two of one object (double vision).  · Feeling dizzy.  · Feeling sick to your stomach (nauseous) and throwing up (vomiting).  · A very bad headache for no reason.  Get help as soon as any of these problems start. This is important. Some treatments work better if they are given right away. These include:  · Aspirin.  · Medicines to control blood pressure.  · A shot (injection) of medicine to break up the blood clot.  · Treatments given in the blood vessel (artery) to take out the clot or break it up.  Other treatments may include:  · Oxygen.  · Fluids given through an IV tube.  · Medicines to thin out your blood.  · Procedures to help your blood flow better.  What increases the risk?  Certain things may make you more likely to have a stroke. Some of these are things that you can change, such as:  · Being very overweight (obesity).  · Smoking.  · Taking birth control pills.  · Not being active.  · Drinking too much alcohol.  · Using drugs.  Other risk factors include:  · High blood pressure.  · High cholesterol.  · Diabetes.  · Heart disease.  · Being African American, Native American, Hispanic, or [place] Native.  · Being over age 60.  · Family history of stroke.  · Having had blood clots,  stroke, or warning stroke (transient ischemic attack, TIA) in the past.  · Sickle cell disease.  · Being a woman with a history of high blood pressure in pregnancy (preeclampsia).  · Migraine headache.  · Sleep apnea.  · Having an irregular heartbeat (atrial fibrillation).  · Long-term (chronic) diseases that cause soreness and swelling (inflammation).  · Disorders that affect how your blood clots.  Follow these instructions at home:  Medicines  · Take over-the-counter and prescription medicines only as told by your doctor.  · If you were told to take aspirin or another medicine to thin your blood, take it exactly as told by your doctor.  ? Taking too much of the medicine can cause bleeding.  ? If you do not take enough, it may not work as well.  · Know the side effects of your medicines. If you are taking a blood thinner, make sure you:  ? Hold pressure over any cuts for longer than usual.  ? Tell your dentist and other doctors that you take this medicine.  ? Avoid activities that may cause damage or injury to your body.  Eating and drinking  · Follow instructions from your doctor about what you cannot eat or drink.  · Eat   healthy foods.  · If you have trouble with swallowing, do these things to avoid choking:  ? Take small bites when eating.  ? Eat foods that are soft or pureed.  Safety  · Follow instructions from your health care team about physical activity.  · Use a walker or cane as told by your doctor.  · Keep your home safe so you do not fall. This may include:  ? Having experts look at your home to make sure it is safe.  ? Putting grab bars in the bedroom and bathroom.  ? Using raised toilets.  ? Putting a seat in the shower.  General instructions  · Do not use any tobacco products.  ? Examples of these are cigarettes, chewing tobacco, and e-cigarettes.  ? If you need help quitting, ask your doctor.  · Limit how much alcohol you drink. This means no more than 1 drink a day for nonpregnant women and 2 drinks  a day for men. One drink equals 12 oz of beer, 5 oz of wine, or 1½ oz of hard liquor.  · If you need help to stop using drugs or alcohol, ask your doctor to refer you to a program or specialist.  · Stay active. Exercise as told by your doctor.  · Keep all follow-up visits as told by your doctor. This is important.  Get help right away if:    · You have any signs of a stroke. "BE FAST" is an easy way to remember the main warning signs:  ? B - Balance. Signs are dizziness, sudden trouble walking, or loss of balance.  ? E - Eyes. Signs are trouble seeing or a change in how you see.  ? F - Face. Signs are sudden weakness or loss of feeling of the face, or the face or eyelid drooping on one side.  ? A - Arms. Signs are weakness or loss of feeling in an arm. This happens suddenly and usually on one side of the body.  ? S - Speech. Signs are sudden trouble speaking, slurred speech, or trouble understanding what people say.  ? T - Time. Time to call emergency services. Write down what time symptoms started.  · You have other signs of a stroke, such as:  ? A sudden, very bad headache with no known cause.  ? Feeling sick to your stomach (nausea).  ? Throwing up (vomiting).  ? Jerky movements you cannot control (seizure).  These symptoms may be an emergency. Do not wait to see if the symptoms will go away. Get medical help right away. Call your local emergency services (911 in the [place] Do not drive yourself to the hospital.  Summary  · An ischemic stroke is the sudden death of brain tissue.  · Symptoms of a stroke usually happen all of a sudden. You may notice them when you wake up.  · Get help if you have any warning signs of a stroke. This is important. Some treatments work better if they are given right away.  This information is not intended to replace advice given to you by your health care provider. Make sure you discuss any questions you have with your health care provider.  Document Released: 06/28/2011 Document  Revised: 12/18/2017 Document Reviewed: 10/05/2015  Elsevier Interactive Patient Education © 2019 Elsevier Inc.

## 2018-09-01 ENCOUNTER — Other Ambulatory Visit: Payer: Self-pay

## 2018-09-01 NOTE — Patient Outreach (Signed)
[facility]) Care Management  09/01/2018  Drexel Ivey 1932/01/05 [id number]    EMMI-STROKE RED ON EMMI ALERT Day # 9 Date: 08/29/2018 Red Alert Reason: " Feeling worse overall? Yes"   Outreach attempt # 1 to patient. Spoke with spouse(DPR on file) as she voiced patient was busy handling some business affairs. Reviewed and addressed red alert with spouse. She voices that patient had "a little spell" the other day. He was upstairs working in his office for about an hour. Spouse went to check on him and patient complained of feeling weak and not able to move around. Spouse checked vitals and BP was 1115/77 and HR 48. Spouse called MD office and was told patient had probably overexerted himself and was tired and wore out. Spouse shares that it has been difficult for patient to relax and take it easy and he is very a active person prior to stroke. She reports that she is monitoring and limiting how much patient does to help him not overdue it. She voices that patient overdid it again yesterday and had a spell but did not tell spouse until this morning. RN CM gave some education and support to spouse on how to manage patient. Patient has already completed PCP follow up appt and goes for neuro appt on 10/08/18 and sleep study on 10/01/18. She is aware to seek medical attention if symptoms worsen and/or are unresolved. Spouse denies any issues with meds. She has a routine/schedule in place to assist patient. No further RN CM needs or concerns at this time. Spouse appreciative of follow up call.     Plan: RN CM will close case as no further interventions needed at this time.   [name], RN,BSN,CCM [place] Management Telephonic Care Management Coordinator Direct Phone: [phone] Toll Free: [phone] Fax: [phone]

## 2018-09-03 ENCOUNTER — Other Ambulatory Visit: Payer: Self-pay

## 2018-09-03 NOTE — Patient Outreach (Signed)
[name]) Care Management  09/03/2018  Chad Norris January 23, 1932 [id number]    EMMI-Stroke RED ON EMMI ALERT Day # 13 Date: 09/02/18 Red Alert Reason: " Feeling worse overall? Yes [name] to eat and drink? No  Went to follow-up appt? No"   Outreach attempt # 1 to patient. Spoke with spouse who reported patient was doing better and able to speak for himself now. Call completed with patient. Reviewed and addressed red alerts. Patient states that he could not hear machine correctly and machine recorded inaccurate responses. He denies any new and/or worsening symptoms. Patient reports that he has been feeling "great and excellent." He voices no further episodes of heart rate dropping and getting very fatigued and weak. He reports everything is going well at this time. Patient has neuro appt on 10/08/2018 and has already completed PCP appt as previously confirmed with spouse. No further RN CM needs or concerns identified. Patient has completed post discharge automated EMMI-Stroke calls. He voiced understanding and was appreciative of call.    Plan: RN CM will close case as no further interventions needed at this time.  [name], RN,BSN,CCM [place] Management Telephonic Care Management Coordinator Direct Phone: [phone] Toll Free: [phone] Fax: [phone]

## 2018-09-26 ENCOUNTER — Other Ambulatory Visit: Payer: Self-pay | Admitting: Family Medicine

## 2018-09-26 ENCOUNTER — Encounter (HOSPITAL_COMMUNITY): Payer: Self-pay

## 2018-09-26 ENCOUNTER — Ambulatory Visit: Payer: Self-pay | Admitting: *Deleted

## 2018-09-26 ENCOUNTER — Other Ambulatory Visit: Payer: Self-pay

## 2018-09-26 ENCOUNTER — Emergency Department (HOSPITAL_COMMUNITY): Payer: Medicare Other

## 2018-09-26 ENCOUNTER — Emergency Department (HOSPITAL_COMMUNITY)
Admission: EM | Admit: 2018-09-26 | Discharge: 2018-09-26 | Disposition: A | Discharge status: Home / Self Care | Payer: Medicare Other | Attending: Emergency Medicine | Admitting: Emergency Medicine

## 2018-09-26 DIAGNOSIS — Z7982 Long term (current) use of aspirin: Secondary | ICD-10-CM | POA: Diagnosis not present

## 2018-09-26 DIAGNOSIS — Z79899 Other long term (current) drug therapy: Secondary | ICD-10-CM | POA: Diagnosis not present

## 2018-09-26 DIAGNOSIS — I6359 Cerebral infarction due to unspecified occlusion or stenosis of other cerebral artery: Secondary | ICD-10-CM | POA: Insufficient documentation

## 2018-09-26 DIAGNOSIS — I63522 Cerebral infarction due to unspecified occlusion or stenosis of left anterior cerebral artery: Secondary | ICD-10-CM | POA: Diagnosis not present

## 2018-09-26 DIAGNOSIS — I63322 Cerebral infarction due to thrombosis of left anterior cerebral artery: Secondary | ICD-10-CM | POA: Diagnosis not present

## 2018-09-26 DIAGNOSIS — R531 Weakness: Secondary | ICD-10-CM | POA: Diagnosis present

## 2018-09-26 DIAGNOSIS — I639 Cerebral infarction, unspecified: Secondary | ICD-10-CM | POA: Diagnosis not present

## 2018-09-26 HISTORY — DX: Cerebral infarction, unspecified: I63.9

## 2018-09-26 MED ORDER — CLOPIDOGREL BISULFATE 75 MG PO TABS: 75.0000 mg | ORAL_TABLET | Freq: Every day | ORAL | 0 refills | Status: DC

## 2018-09-26 MED ORDER — ATORVASTATIN CALCIUM 40 MG PO TABS: 40.0000 mg | ORAL_TABLET | Freq: Every day | ORAL | 1 refills | Status: DC

## 2018-09-26 NOTE — Telephone Encounter (Signed)
Copied from [place] [id number]. Topic: Quick Communication - See Telephone Encounter >> Sep 26, 2018 12:11 PM Antonieta Iba C wrote: CRM for notification. See Telephone encounter for: 09/26/18.  Pt's spouse called in to request a refill on medication, pt is unable to get in with his neurologist until 10/08/18, pt is currently out of his meds. Pt had a stoke in January of this year.   Pt is requesting the generic for Plavix and also atorvastatin (LIPITOR) 40 MG tablet. Spouse says that yesterday pt experienced some right leg numbness and some slurd speech but spouse says that today pt seems just fine. Spouse feels that this happened because pt has been out of his medication for a few days waiting to see Neuro.   Pharmacy: [facility] [place], [address] AT [facility] [address] [phone] (Phone) [phone] (Fax)

## 2018-09-26 NOTE — Telephone Encounter (Signed)
Neuro only recommend plavix x3 weeks then just asa.  If he is having symptoms of slurred speech/numbness he needs to go to the hospital.  Lipitor renewed

## 2018-09-26 NOTE — ED Triage Notes (Signed)
Pt reports he has been having right leg numbness since 0300 this morning. Pt also reports an episode of incontinence that happened yesterday. Pt denies any headache. AOX4.

## 2018-09-26 NOTE — Consult Note (Signed)
Neurology Consultation Reason for Consult: Stroke Referring Physician: Coralyn Pear, K  CC: Right leg numbness  History is obtained from: Patient  HPI: Chad Norris is a 85 y.o. male who just underwent a stroke work-up for a lentiform nucleus infarct approximately 1 month ago.  He was discharged and has been recovering, but then last night around 3 AM he noticed that the distal portion of his right leg became numb.  He stood up and walked out to his chair but the numbness continued.  Today due to continued symptoms, he sought care in the emergency department where an MRI was performed which demonstrates persistent diffusion change in a similar region to his previous stroke.   LKW: 3 AM tpa given?: no, outside of window NIHSS: 1   ROS: A 14 point ROS was performed and is negative except as noted in the HPI.    Past Medical History:  Diagnosis Date  . Colon polyps   . Diverticulosis   . Gout   . Hemorrhoids   . Hyperlipemia   . Nephrolithiasis   . Prostate cancer ([place])   . Stroke [facility])      Family History  Problem Relation Age of Onset  . Kidney cancer Father   . Colon cancer Father   . Cancer Brother        ?  [name] Heart attack Brother 19  . Heart attack Sister 95  . Breast cancer Sister   . Diabetes Brother   . Alcohol abuse Brother   . Colon polyps Brother      Social History:  reports that he has never smoked. He has never used smokeless tobacco. He reports current alcohol use of about 1.0 standard drinks of alcohol per week. He reports that he does not use drugs.   Exam: Current vital signs: BP 124/70 (BP Location: Right Arm)   Pulse 61   Temp 98.1 F (36.7 C) (Oral)   Resp 18   SpO2 99%  Vital signs in last 24 hours: Temp:  [97.4 F (36.3 C)-98.1 F (36.7 C)] 98.1 F (36.7 C) (03/06 2130) Pulse Rate:  [54-63] 61 (03/06 2130) Resp:  [16-18] 18 (03/06 2130) BP: (121-135)/(65-78) 124/70 (03/06 2130) SpO2:  [95 %-100 %] 99 % (03/06 2130)   Physical Exam   Constitutional: Appears well-developed and well-nourished.  Psych: Affect appropriate to situation Eyes: No scleral injection HENT: No OP obstrucion Head: Normocephalic.  Cardiovascular: Normal rate and regular rhythm.  Respiratory: Effort normal, non-labored breathing GI: Soft.  No distension. There is no tenderness.  Skin: WDI  Neuro: Mental Status: Patient is awake, alert, oriented to person, place, month, year, and situation. Patient is able to give a clear and coherent history. No signs of aphasia or neglect Cranial Nerves: II: Visual Fields are full. Pupils are equal, round, and reactive to light.   III,IV, VI: EOMI without ptosis or diploplia.  V: Facial sensation is symmetric to temperature VII: Facial movement is symmetric.  VIII: hearing is intact to voice X: Uvula elevates symmetrically XI: Shoulder shrug is symmetric. XII: tongue is midline without atrophy or fasciculations.  Motor: Tone is normal. Bulk is normal. 5/5 strength was present in all four extremities.  Sensory: Sensation is symmetric to light touch and temperature in the arms, but in the right leg he has circumferential decreased to light touch below the right knee. Deep Tendon Reflexes: 2+ and symmetric in the biceps and patellae, decreased bilaterally at the ankles Cerebellar: Finger-nose-finger intact bilaterally. Gait: Narrow-based and stable  I have reviewed labs in epic and the results pertinent to this consultation are: CMP-borderline creatinine  I have reviewed the images obtained: MRI brain- persistent diffusion change in the region of his previous stroke that would be expected to have resolved by this point.  Impression: 85 year old male with what I suspect is slight worsening of his previous stroke rather than a truly new stroke.  He does not have any clear evidence of motor deficits or gait dysfunction that would require therapy interventions.  No evidence that he has had a hypotensive  event.  At this time, I think that I would favor repeating a 3-week course of dual antiplatelet therapy, but otherwise I do not think that we would be likely to change management through hospital admission at this time.  I did advise him that if he were to have recurrent symptoms, then he should return to care in the emergency department.  Recommendations: 1) aspirin and Plavix dual therapy for an additional 3 weeks 2) continue other secondary stroke prevention measures as previously instructed 3) follow-up with Dr. Leonie Man this week as scheduled.  Roland Rack, MD [facility] [phone]  If 7pm- 7am, please page neurology on call as listed in [place].

## 2018-09-26 NOTE — Telephone Encounter (Signed)
Please seee refill request encounter this was addressed. Nothing further is needed   Luetta Nutting, DO  to Me      09/26/18 1:21 PM  Note    Neuro only recommend plavix x3 weeks then just asa.  If he is having symptoms of slurred speech/numbness he needs to go to the hospital.  Lipitor renewed

## 2018-09-26 NOTE — Telephone Encounter (Signed)
Contacted pt and he states that he does not; he says that he has numbness due to bad circulation in his right; he says that this is not new; the pt's speech was also a little slurred on 09/25/2018 but the pt said that his speech has gotten better on 09/26/2018; it is reported that the office will not fill his prescriptions until he is seen in the office on 10/02/2018; also see nurse triage note dated 09/26/2018; he states that he is completely out of medication (plavix and atorvastatin) for 3-4 days; the pt uses [place], [place];  the pt says that his wife is not at home, but she wlll call back for instructions; they can also be contacted at  [phone]; pt last seen by Dr Luetta Nutting, Audrie Lia, 08/26/2018; also see nurse triage dated 09/26/2018;  will route to office for final disposition; also spoke with Andee Poles.

## 2018-09-26 NOTE — ED Notes (Signed)
Patient verbalizes understanding of discharge instructions. Opportunity for questioning and answers were provided. Armband removed by staff, pt discharged from ED ambulatory w/ wife 

## 2018-09-26 NOTE — ED Notes (Signed)
Patient transported to MR. 

## 2018-09-26 NOTE — Telephone Encounter (Signed)
Also see telephone encounter for medication refill on 09/26/2018; pt's wife previously called and states that on 09/25/2018 he had numbness in his right leg and slurred speech due to him being out of medication (atorvastatin and plavix); the pt says that his speech gets better everyday, and his leg numbness is due to circulation; the medication is per neurology; it is reported that the office will not fill his prescriptions until he is seen in the office on 10/02/2018; the pt says that his wife is not at home, but she wlll call back for instructions; they can also be contacted at  [phone]; pt last seen by Dr Luetta Nutting, [facility], 08/26/2018; will route to office for notification.   Reason for Disposition . [1] Tingling in foot (e.g., pins and needles) AND [2] after prolonged sitting AND [3] brief (now gone)  Answer Assessment - Initial Assessment Questions 1. SYMPTOM: "What is the main symptom you are concerned about?" (e.g., weakness, numbness)     Right leg numbness 2. ONSET: "When did this start?" (minutes, hours, days; while sleeping)     09/25/2018 now resoved 3. LAST NORMAL: "When was the last time you were normal (no symptoms)?"     09/25/2018 4. PATTERN "Does this come and go, or has it been constant since it started?"  "Is it present now?"     No longer present 5. CARDIAC SYMPTOMS: "Have you had any of the following symptoms: chest pain, difficulty breathing, palpitations?"     no 6. NEUROLOGIC SYMPTOMS: "Have you had any of the following symptoms: headache, dizziness, vision loss, double vision, changes in speech, unsteady on your feet?"     Slurred speech; resolved; says that speech has gotten better everyday since having stroke 7. OTHER SYMPTOMS: "Do you have any other symptoms?"     no 8. PREGNANCY: "Is there any chance you are pregnant?" "When was your last menstrual period?"     n/a  Protocols used: NEUROLOGIC DEFICIT-A-AH

## 2018-09-26 NOTE — Discharge Instructions (Signed)
Dear Chad Norris  You came to [place] with right leg numbness. We have determined this was caused by stroke. Here are our recommendations for you at discharge:  Please start taking clopidogrel for 3 more weeks Please take your other home medications as prescribed Please follow up with your neurologist  Thank you for choosing [facility]

## 2018-09-26 NOTE — ED Provider Notes (Signed)
[facility] EMERGENCY DEPARTMENT Provider Note   CSN: [id number] Arrival date & time: 09/26/18  1538    History   Chief Complaint Chief Complaint  Patient presents with  . Extremity Weakness    HPI Chad Norris is a 85 y.o. 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 He states he was in his usual state of health until late last night when he noticed numbness on his Right shin. He states he thought his numbness would disappear on its own but it persisted throughout the morning and states came to the ED as advised. His wife mentions that he had finished his 3 week course of DAPT with aspirin and plavix 2 weeks ago and has been adherent to aspirin therapy. Also mentions that his Lipitor had run out 2 weeks ago and was not able to get refills on it until this morning. He mentions 1 episode of urinary incontinence yesterday morning but denies any loss of consciousness or altered mental status. Currently denies any headache, blurry vision, focal weakness, burning sensation, tingling, nausea, or vomiting.  Based on chart review, he presented with dysarthria during his prior admission for CVA in 08/18/18 and was found to have acute infarct in left posterior lentiform nucleus as well as occluded proximal left A1 segment. He states he had no reoccurrence of his dysarthria since discharge.    Past Medical History:  Diagnosis Date  . Colon polyps   . Diverticulosis   . Gout   . Hemorrhoids   . Hyperlipemia   . Nephrolithiasis   . Prostate cancer ([place])   . Stroke [facility])     Patient Active Problem List   Diagnosis Date Noted  . Acute CVA (cerebrovascular accident) ([place]) 08/19/2018  . Acute ischemic stroke ([place]) 08/18/2018  . Stroke ([place]) 08/18/2018  . Sore throat 06/25/2018  . Well adult exam 03/27/2018  . Hyperlipidemia 03/27/2018  . Basal cell carcinoma (BCC) of [place] 03/11/2017  . Prediabetes 03/11/2017  . DDD (degenerative disc disease),  lumbosacral 01/13/2014  . NONSPECIFIC ABNORMAL ELECTROCARDIOGRAM 06/21/2008  . PROSTATE CANCER, HX OF 06/21/2008  . COLONIC POLYPS, HX OF 06/21/2008  . DIVERTICULOSIS, COLON 05/07/2008  . NEPHROLITHIASIS, HX OF 05/07/2008  . History of gout 10/09/2007    Past Surgical History:  Procedure Laterality Date  . CATARACT EXTRACTION W/ INTRAOCULAR LENS  IMPLANT, BILATERAL     bi-lat  . CERVICAL SPINE SURGERY    . COLONOSCOPY W/ POLYPECTOMY    . Epidural steroid injections     3-4, Dr Ellene Route  . RETROPUBIC PROSTATECTOMY    . SHOULDER SURGERY     left        Home Medications    Prior to Admission medications   Medication Sig Start Date End Date Taking? Authorizing Provider  acetaminophen (TYLENOL) 500 MG tablet Take 1,000 mg by mouth every 6 (six) hours as needed (pain).   Yes [provider]  aspirin 81 MG chewable tablet Chew 1 tablet (81 mg total) by mouth daily. 08/20/18  Yes Hongalgi, Lenis Dickinson, MD  atorvastatin (LIPITOR) 40 MG tablet Take 1 tablet (40 mg total) by mouth daily at 6 PM. 09/26/18  Yes Luetta Nutting, DO  Multiple Vitamins-Minerals (ADULT ONE DAILY GUMMIES) CHEW Chew 2 tablets by mouth daily. men   Yes [provider]  clopidogrel (PLAVIX) 75 MG tablet Take 1 tablet (75 mg total) by mouth daily for 21 days. 09/26/18 10/17/18  Mosetta Anis, MD  Family History Family History  Problem Relation Age of Onset  . Kidney cancer Father   . Colon cancer Father   . Cancer Brother        ?  [name] Heart attack Brother 69  . Heart attack Sister 75  . Breast cancer Sister   . Diabetes Brother   . Alcohol abuse Brother   . Colon polyps Brother     Social History Social History   Tobacco Use  . Smoking status: Never Smoker  . Smokeless tobacco: Never Used  Substance Use Topics  . Alcohol use: Yes    Alcohol/week: 1.0 standard drinks    Types: 1 Cans of beer per week  . Drug use: No     Allergies   Patient has no known allergies.   Review of  Systems Review of Systems  Constitutional: Negative for chills, fatigue and fever.  HENT: Negative for drooling, hearing loss and rhinorrhea.   Respiratory: Negative for shortness of breath and wheezing.   Cardiovascular: Negative for chest pain and palpitations.  Gastrointestinal: Negative for constipation, diarrhea, nausea and vomiting.  Genitourinary: Negative for dysuria and urgency.  Musculoskeletal: Negative for back pain and myalgias.  Neurological: Positive for numbness. Negative for dizziness, seizures, syncope, weakness, light-headedness and headaches.  Psychiatric/Behavioral: Negative for confusion.     Physical Exam Updated Vital Signs BP 135/76   Pulse (!) 57   Temp (!) 97.4 F (36.3 C) (Oral)   Resp 16   SpO2 98%   Physical Exam Constitutional:      General: He is not in acute distress.    Appearance: He is normal weight.  HENT:     Head: Normocephalic and atraumatic.  Eyes:     Extraocular Movements: Extraocular movements intact.     Conjunctiva/sclera: Conjunctivae normal.     Pupils: Pupils are equal, round, and reactive to light.  Neck:     Musculoskeletal: Normal range of motion and neck supple.  Cardiovascular:     Rate and Rhythm: Normal rate and regular rhythm.     Pulses: Normal pulses.     Heart sounds: Normal heart sounds.  Pulmonary:     Effort: Pulmonary effort is normal.     Breath sounds: Normal breath sounds. No wheezing or rales.  Abdominal:     General: Abdomen is flat. Bowel sounds are normal.     Palpations: Abdomen is soft.  Musculoskeletal: Normal range of motion.        General: No swelling, tenderness or deformity.  Skin:    General: Skin is warm and dry.  Neurological:     Mental Status: He is alert.     Comments: Neurologic exam: Mental status: A&Ox3 Cranial Nerves: II: PERRL III, IV, VI: Extra-occular motions intact bilaterally V, VII: Face symmetric, sensation intact in all 3  divisions  VIII: hearing normal to rubbing fingers bilaterally  IX, X: palate rises symmetrically XI: Head turn and shoulder shrug normal bilaterally  XII: tongue midline  Motor: Strength 5/5 on all upper and lower extremities, bulk muscle and tone are normal  Deep Tendon Reflexes: 2+ symmetric Gait:Normal Sensory: Decreased sensation to light touch on left shin both lateral and medial sides Coordination: There is no dysmetria on finger-to-nose. Rapid alternating movement test normal. Psychiatric: Normal mood and affect      ED Treatments / Results  Labs (all labs ordered are listed, but only abnormal results are displayed) Labs Reviewed - No data to display  EKG None  Radiology Mr  Brain Wo Contrast  Result Date: 09/26/2018 CLINICAL DATA:  85 y/o  M; right leg numbness. EXAM: MRI HEAD WITHOUT CONTRAST TECHNIQUE: Multiplanar, multiecho pulse sequences of the brain and surrounding structures were obtained without intravenous contrast. COMPARISON:  08/18/2018 MRI head. FINDINGS: Brain: 9 mm focus of reduced diffusion at the superior margin of now chronic fracture in the left lentiform nucleus (series 5, image 82 and series 6, image 30) compatible with acute/early subacute infarction. Stable punctate nonspecific T2 FLAIR hyperintensities in subcortical and periventricular white matter are compatible with mild chronic microvascular ischemic changes. Stable mild volume loss of the brain. No extra-axial collection, hydrocephalus, mass effect, hemorrhage, or herniation. Stable nonspecific punctate chronic microhemorrhages within the right posterior parietal lobe. Vascular: Normal flow voids. Skull and upper cervical spine: Normal marrow signal. Sinuses/Orbits: Mucous retention cysts within the maxillary sinuses and the sphenoid sinus. No abnormal signal of mastoid air cells. Bilateral intra-ocular lens replacement. Other: None. IMPRESSION: 1.  New/recrudescent 9 mm acute/early subacute infarction at the superior margin of now chronic infarct in left lentiform nucleus. No hemorrhage or mass effect. 2. Stable mild chronic microvascular ischemic changes and volume loss of the brain. Electronically Signed   By: Kristine Garbe M.D.   On: 09/26/2018 19:17    Procedures Procedures (including critical care time)  Medications Ordered in ED Medications - No data to display   Initial Impression / Assessment and Plan / ED Course  I have reviewed the triage vital signs and the nursing notes.  Pertinent labs & imaging results that were available during my care of the patient were reviewed by me and considered in my medical decision making (see chart for details).    Mr.Keasling is a 85 yo M w/ prior hx of CVA presenting with acute onset R sided numbness. He has known occluded proximal left A1 segment with prior hx of CVA involving left lentiform nucleus. He may have had a new stroke or this may be sequelae of his old infarct. As he has had extensive stroke work-up about a month ago with known reason for CVA, no further work-up is needed at this time. Will call neurology for advice on resuming dual antiplatelet therapy and discharge home.  Discussed case with Dr.Aroor. Recommending MRI to assess for new infarct. MRI is showing acute 51mm infarc at margin of chronic infarct. Currently not orthostatic.  Final Clinical Impressions(s) / ED Diagnoses   Final diagnoses:  Cerebrovascular accident (CVA) due to occlusion of left anterior cerebral artery ([place])   Mr.Detjen is a 85 yo M w/ PMH of CVA presenting with right lower extremity numbness 2/2 acute infarct of his left lentiform nucleus. Evaluated by Dr.Kirkpatrick with neuro, okay to discharge home w/ 3 more weeks of plavix.   ED Discharge Orders         Ordered    clopidogrel (PLAVIX) 75 MG tablet  Daily     09/26/18 2127           Mosetta Anis, MD 09/26/18 2127    Lajean Saver, MD 09/26/18 2220

## 2018-09-26 NOTE — Telephone Encounter (Addendum)
Spoke with wife [name], and she asked pt if he was still having the slurred speech and numbness per pt he was ok right now but still informed them of Dr. West Pugh message in regards to the slurred speech/numbness and informed them of refills. They both understood and had no additional questions at this time. Nothing further is needed

## 2018-09-27 ENCOUNTER — Other Ambulatory Visit (HOSPITAL_COMMUNITY): Payer: Self-pay | Admitting: Internal Medicine

## 2018-10-01 ENCOUNTER — Encounter: Payer: Self-pay | Admitting: Neurology

## 2018-10-01 ENCOUNTER — Ambulatory Visit: Payer: Medicare Other | Admitting: Neurology

## 2018-10-01 ENCOUNTER — Other Ambulatory Visit: Payer: Self-pay

## 2018-10-01 VITALS — BP 134/77 | HR 54 | Ht 72.0 in | Wt 210.0 lb

## 2018-10-01 DIAGNOSIS — Z8673 Personal history of transient ischemic attack (TIA), and cerebral infarction without residual deficits: Secondary | ICD-10-CM | POA: Diagnosis not present

## 2018-10-01 DIAGNOSIS — R351 Nocturia: Secondary | ICD-10-CM

## 2018-10-01 DIAGNOSIS — R0681 Apnea, not elsewhere classified: Secondary | ICD-10-CM

## 2018-10-01 DIAGNOSIS — R0683 Snoring: Secondary | ICD-10-CM | POA: Diagnosis not present

## 2018-10-01 NOTE — Progress Notes (Signed)
Epworth Sleepiness Scale 0= would never doze 1= slight chance of dozing 2= moderate chance of dozing 3= high chance of dozing  Sitting and reading: 1 Watching TV: 0 Sitting inactive in a public place (ex. Theater or meeting): 1 As a passenger in a car for an hour without a break: 0 Lying down to rest in the afternoon: 0 Sitting and talking to someone: 0 Sitting quietly after lunch (no alcohol): 0 In a car, while stopped in traffic: 0 Total: 3

## 2018-10-01 NOTE — Patient Instructions (Signed)
Thank you for choosing [name] for your sleep related care! It was nice to meet you today! I appreciate that you entrust me with your sleep related healthcare concerns. I hope, I was able to address at least some of your concerns today, and that I can help you feel reassured and also get better.    Here is what we discussed today and what we came up with as our plan for you:    Based on your symptoms and your exam I believe you are at risk for obstructive sleep apnea (aka OSA), and I think we should proceed with a sleep study to determine whether you do or do not have OSA and how severe it is. Even, if you have mild OSA, I may want you to consider treatment with CPAP, as treatment of even borderline or mild sleep apnea can result and improvement of symptoms such as sleep disruption, daytime sleepiness, nighttime bathroom breaks, restless leg symptoms, improvement of headache syndromes, even improved mood disorder.   Please remember, the long-term risks and ramifications of untreated moderate to severe obstructive sleep apnea are: increased Cardiovascular disease, including congestive heart failure, stroke, difficult to control hypertension, treatment resistant obesity, arrhythmias, especially irregular heartbeat commonly known as A. Fib. (atrial fibrillation); even type 2 diabetes has been linked to untreated OSA.   Sleep apnea can cause disruption of sleep and sleep deprivation in most cases, which, in turn, can cause recurrent headaches, problems with memory, mood, concentration, focus, and vigilance. Most people with untreated sleep apnea report excessive daytime sleepiness, which can affect their ability to drive. Please do not drive if you feel sleepy. Patients with sleep apnea developed difficulty initiating and maintaining sleep (aka insomnia).   Having sleep apnea may increase your risk for other sleep disorders, including involuntary behaviors sleep such as sleep terrors,  sleep talking, sleepwalking.    Having sleep apnea can also increase your risk for restless leg syndrome and leg movements at night.   Please note that untreated obstructive sleep apnea may carry additional perioperative morbidity. Patients with significant obstructive sleep apnea (typically, in the moderate to severe degree) should receive, if possible, perioperative PAP (positive airway pressure) therapy and the surgeons and particularly the anesthesiologists should be informed of the diagnosis and the severity of the sleep disordered breathing.   I will likely see you back after your sleep study to go over the test results and where to go from there. We will call you after your sleep study to advise about the results (most likely, you will hear from Dwight, my nurse) and to set up an appointment at the time, as necessary.    Please call our office if you change your mind and are ready to proceed with a sleep study.

## 2018-10-01 NOTE — Progress Notes (Signed)
Subjective:    Patient ID: Chad Norris is a 85 y.o. male.  HPI     Star Age, MD, PhD [facility] [address], [address] [place], [place] [address]  I saw patient, Chad Norris, is a referral from the hospital for concern for obstructive sleep apnea. The patient is accompanied by his wife today. Chad Norris is an 61 year old right-handed gentleman with an underlying medical history of stroke in January 2020 with occluded left A1 segment of the ACA, recent stroke in March 2020, hyperlipidemia, kidney stones, prostate cancer, gout, diverticulosis, and overweight state, who reports snoring. I reviewed the hospital records from his admission on 08/18/2018. He was discharged on 08/19/2018. He was seen in the emergency room on 09/26/2018 with acute weakness of the right leg and was found to have an acute or early subacute infarct at the margin of his prior left lentiform nucleus infarct. His Epworth sleepiness score is 3 out of 24 today, fatigue score is 26 out of 63. He lives with his wife, they have 2 children. He is a nonsmoker and does not utilize alcohol on a regular basis, reports that a case of beer may last month, drinks caffeine in the form of coffee, one cup per day on average in the morning, tries to hydrate well with water. He feels nearly back to baseline since his stroke. His son has obstructive sleep apnea and grandson has obstructive sleep apnea, both have CPAP machines. Patient reports nocturia about twice per average night, bedtime is around 10:30 and rise time around 6:30. He denies morning headaches. He stays very busy. 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 He has an upcoming appointment in stroke clinic. He reports sleeping fairly well. His wife reports snoring particularly when he sleeps on his back and she noticed him  sometimes because he pauses in his breathing while asleep. This is better when he sleeps on his sides. He takes an afternoon nap typically around 2 PM daily.  His Past Medical History Is Significant For: Past Medical History:  Diagnosis Date  . Colon polyps   . Diverticulosis   . Gout   . Hemorrhoids   . Hyperlipemia   . Nephrolithiasis   . Prostate cancer ([place])   . Stroke [facility])     His Past Surgical History Is Significant For: Past Surgical History:  Procedure Laterality Date  . CATARACT EXTRACTION W/ INTRAOCULAR LENS  IMPLANT, BILATERAL     bi-lat  . CERVICAL SPINE SURGERY    . COLONOSCOPY W/ POLYPECTOMY    . Epidural steroid injections     3-4, Dr Ellene Route  . RETROPUBIC PROSTATECTOMY    . SHOULDER SURGERY     left    His Family History Is Significant For: Family History  Problem Relation Age of Onset  . Kidney cancer Father   . Colon cancer Father   . Cancer Brother        ?  [name] Heart attack Brother 27  . Heart attack Sister 58  . Breast cancer Sister   . Diabetes Brother   . Alcohol abuse Brother   . Colon polyps Brother     His Social History Is Significant For: Social History   Socioeconomic History  . Marital status: Married    Spouse name: Not on file  . Number of children: 2  .  Years of education: Not on file  . Highest education level: Not on file  Occupational History  . Occupation: retired  Scientific laboratory technician  . Financial resource strain: Not on file  . Food insecurity:    Worry: Not on file    Inability: Not on file  . Transportation needs:    Medical: Not on file    Non-medical: Not on file  Tobacco Use  . Smoking status: Never Smoker  . Smokeless tobacco: Never Used  Substance and Sexual Activity  . Alcohol use: Yes    Alcohol/week: 1.0 standard drinks    Types: 1 Cans of beer per week  . Drug use: No  . Sexual activity: Not on file  Lifestyle  . Physical activity:    Days per week: Not on file    Minutes per session: Not on file  .  Stress: Not on file  Relationships  . Social connections:    Talks on phone: Not on file    Gets together: Not on file    Attends religious service: Not on file    Active member of club or organization: Not on file    Attends meetings of clubs or organizations: Not on file    Relationship status: Not on file  Other Topics Concern  . Not on file  Social History Narrative  . Not on file    His Allergies Are:  No Known Allergies:   His Current Medications Are:  Outpatient Encounter Medications as of 10/01/2018  Medication Sig  . acetaminophen (TYLENOL) 500 MG tablet Take 1,000 mg by mouth every 6 (six) hours as needed (pain).  [name] aspirin 81 MG chewable tablet Chew 1 tablet (81 mg total) by mouth daily.  [name] atorvastatin (LIPITOR) 40 MG tablet Take 1 tablet (40 mg total) by mouth daily at 6 PM.  . clopidogrel (PLAVIX) 75 MG tablet Take 1 tablet (75 mg total) by mouth daily for 21 days.  . Multiple Vitamins-Minerals (ADULT ONE DAILY GUMMIES) CHEW Chew 2 tablets by mouth daily. men   No facility-administered encounter medications on file as of 10/01/2018.   :  Review of Systems:  Out of a complete 14 point review of systems, all are reviewed and negative with the exception of these symptoms as listed below: Review of Systems  Neurological:       Pt presents today to discuss his sleep. Pt has had a sleep study 30-40 years ago and reports that it was normal. Pt does endorse some snoring.  Epworth Sleepiness Scale 0= would never doze 1= slight chance of dozing 2= moderate chance of dozing 3= high chance of dozing  Sitting and reading: 1 Watching TV: 3 Sitting inactive in a public place (ex. Theater or meeting): 3 As a passenger in a car for an hour without a break: 3 Lying down to rest in the afternoon: 1 Sitting and talking to someone: 3 Sitting quietly after lunch (no alcohol): 3 In a car, while stopped in traffic: 3 Total: 20     Objective:  Neurological Exam  Physical  Exam Physical Examination:   Vitals:   10/01/18 1104  BP: 134/77  Pulse: (!) 54    General Examination: The patient is a very pleasant 85 y.o. male in no acute distress. He appears well-developed and well-nourished and well groomed.   HEENT: Normocephalic, atraumatic, pupils are equal, round and reactive to light. His face is symmetric, no dysarthria noted, he is very hard of hearing, does have hearing aids  in place. Airway examination reveals mild mouth dryness, adequate dental hygiene, moderate airway crowding, secondary to redundant soft palate, large uvula, tonsils in place, about 1+ in size, tongue protrudes centrally and palate elevates symmetrically. Neck circumference is 16 inches.  Chest: Clear to auscultation without wheezing, rhonchi or crackles noted.  Heart: S1+S2+0, regular and normal without murmurs, rubs or gallops noted.   Abdomen: Soft, non-tender and non-distended with normal bowel sounds appreciated on auscultation.  Extremities: There is no pitting edema in the distal lower extremities bilaterally. Pedal pulses are intact.  Skin: Warm and dry without trophic changes noted.  Musculoskeletal: exam reveals no obvious joint deformities, tenderness or joint swelling or erythema.   Neurologically:  Mental status: The patient is awake, alert and oriented in all 4 spheres. His immediate and remote memory, attention, language skills and fund of knowledge are appropriate. There is no evidence of aphasia, agnosia, apraxia or anomia. Speech is clear with normal prosody and enunciation. Thought process is linear. Mood is normal and affect is normal.  Cranial nerves II - XII are as described above under HEENT exam. In addition: shoulder shrug is normal with equal shoulder height noted. Motor exam: Normal bulk, strength and tone is noted, with the exception of minimal right hip flexor weakness noted. There is no tremor. Fine motor skills and coordination: intact with normal finger  taps, normal hand movements, normal rapid alternating patting, normal foot taps and normal foot agility.  Cerebellar testing: No dysmetria or intention tremor. There is no truncal or gait ataxia.  Sensory exam: intact to light touch.  Gait, station and balance: He stands easily. No veering to one side is noted. No leaning to one side is noted. Posture is age-appropriate and stance is slightly wider based. Gait shows no limp, preserved arm swing noted.  Assessment and Plan:  In summary, Chad Norris is a very pleasant 85 y.o.-year old male with an underlying medical history of stroke in January 2020 with occluded left A1 segment of the ACA, recent stroke in March 2020, hyperlipidemia, kidney stones, prostate cancer, gout, diverticulosis, and overweight state, who presents for sleep evaluation as a referral from the hospital, he was recently hospitalized in January with an acute stroke. Thankfully, he has recuperated well after the stroke. His history and physical exam are concerning for underlying obstructive sleep apnea. He reports having a sleep study many years ago which was negative for sleep apnea. He does not currently wish to proceed with a sleep study. I explained the diagnosis of OSA, its prognosis and treatment options. We talked about medical treatments, surgical interventions and non-pharmacological approaches. I explained in particular the risks and ramifications of untreated moderate to severe OSA, especially with respect to developing cardiovascular disease down the Road, including congestive heart failure, difficult to treat hypertension, cardiac arrhythmias, or stroke. Even type 2 diabetes has, in part, been linked to untreated OSA. Symptoms of untreated OSA include daytime sleepiness, memory problems, mood irritability and mood disorder such as depression and anxiety, lack of energy, as well as recurrent headaches, especially morning headaches. We talked about trying to maintain a healthy  lifestyle in general, as well as the importance of weight control. I encouraged the patient to [place] office when he is ready to proceed with a sleep study. He is encouraged to think about it. I answered all their questions today and the patient and his wife were in agreement.  Star Age, MD, PhD

## 2018-10-02 ENCOUNTER — Ambulatory Visit: Payer: Medicare Other | Admitting: Adult Health

## 2018-10-02 ENCOUNTER — Other Ambulatory Visit: Payer: Self-pay

## 2018-10-02 ENCOUNTER — Encounter: Payer: Self-pay | Admitting: Adult Health

## 2018-10-02 VITALS — BP 118/68 | HR 63 | Ht 72.0 in | Wt 212.0 lb

## 2018-10-02 DIAGNOSIS — E785 Hyperlipidemia, unspecified: Secondary | ICD-10-CM

## 2018-10-02 DIAGNOSIS — I63512 Cerebral infarction due to unspecified occlusion or stenosis of left middle cerebral artery: Secondary | ICD-10-CM | POA: Diagnosis not present

## 2018-10-02 DIAGNOSIS — I10 Essential (primary) hypertension: Secondary | ICD-10-CM | POA: Diagnosis not present

## 2018-10-02 MED ORDER — CLOPIDOGREL BISULFATE 75 MG PO TABS: 75.0000 mg | ORAL_TABLET | Freq: Every day | ORAL | 4 refills | Status: DC

## 2018-10-02 MED ORDER — ASPIRIN 81 MG PO CHEW: 81.0000 mg | CHEWABLE_TABLET | Freq: Every day | ORAL | 0 refills | Status: AC

## 2018-10-02 NOTE — Patient Instructions (Signed)
Continue aspirin 81 mg daily and clopidogrel 75 mg daily  and lipitor  for secondary stroke prevention  Continue aspirin and plavix for additional 2 weeks and then continue plavix only for secondary stroke prevention  Continue to follow up with PCP regarding cholesterol and blood pressure management   Continue to monitor blood pressure at home  Maintain strict control of hypertension with blood pressure goal below 130/90, diabetes with hemoglobin A1c goal below 6.5% and cholesterol with LDL cholesterol (bad cholesterol) goal below 70 mg/dL. I also advised the patient to eat a healthy diet with plenty of whole grains, cereals, fruits and vegetables, exercise regularly and maintain ideal body weight.  Followup in the future with me in 6 months or call earlier if needed       Thank you for coming to see [place] at [facility]. I hope we have been able to provide you high quality care today.  You may receive a patient satisfaction survey over the next few weeks. We would appreciate your feedback and comments so that we may continue to improve ourselves and the health of our patients.

## 2018-10-02 NOTE — Progress Notes (Signed)
[name] [address] [place]. [place] [address] [phone]       OFFICE FOLLOW UP NOTE  Chad Norris Date of Birth:  December 23, 1931 Medical Record Number:  [id number]   Reason for Referral:  hospital stroke follow up  CHIEF COMPLAINT:  Chief Complaint  Patient presents with  . Follow-up    follow up for Stroke room  9 pt with wife  Chad Norris     HPI: Chad Norris is being seen today for initial visit in the office for left BG/CR infarct secondary to small vessel disease on 08/18/2018. History obtained from patient, wife and chart review. Reviewed all radiology images and labs personally.  Chad Norris is a 85 y.o. male with history of HLD  who presented with slurred speech, generalized weakness and blurred vision.  CT head reviewed and was negative for hemorrhage but did show old left lenticular nucleus infarct versus chronic microvascular changes and atrophy.  MRI brain reviewed showed acute left lentiform nucleus infarct.  MRA head showed occluded left A1, both ACAs fill from the right and decreased flow left ICA.  MRA neck normal.  2D echo showed an EF of 50 to 55%.  LDL 135 and initiated atorvastatin 40 mg daily.  A1c satisfactory at 5.8.  No antithrombotic PTA and recommended aspirin and Plavix for 3 weeks and aspirin alone.  Other stroke risk factors include advanced age, EtOH use and potential OSA.  He was discharged home in stable condition without therapy needs.  He is being seen today for hospital follow-up and unfortunately return to ED on 09/26/2018 with symptoms of right-sided numbness and imaging did not show acute 9 mm infarct at margin of chronic infarct.  He has been doing well since this time without any residual or recurring symptoms.  He was restarted on 3 weeks DAPT which he continues at this time without side effects of bleeding or bruising.  Continues on atorvastatin without side effects myalgias.  Blood pressure today satisfactory 118/68.  He  remains very active within the community where he is on the board of homeowners association along with frequently playing golf.  He also has drastically changed his diet with assistance of his wife.  He was evaluated by [name] yesterday, 10/01/2018, for potential OSA evaluation but at this time patient declines undergoing sleep study.  No further concerns at this time.  Denies new or worsening stroke/TIA symptoms.   ROS:   14 system review of systems performed and negative with exception of fatigue, numbness, speech difficulty and weakness  PMH:  Past Medical History:  Diagnosis Date  . Colon polyps   . Diverticulosis   . Gout   . Hemorrhoids   . Hyperlipemia   . Nephrolithiasis   . Prostate cancer ([place])   . Stroke [facility])     PSH:  Past Surgical History:  Procedure Laterality Date  . CATARACT EXTRACTION W/ INTRAOCULAR LENS  IMPLANT, BILATERAL     bi-lat  . CERVICAL SPINE SURGERY    . COLONOSCOPY W/ POLYPECTOMY    . Epidural steroid injections     3-4, Dr Ellene Route  . RETROPUBIC PROSTATECTOMY    . SHOULDER SURGERY     left    Social History:  Social History   Socioeconomic History  . Marital status: Married    Spouse name: Not on file  . Number of children: 2  . Years of education: Not on file  . Highest education level: Not on file  Occupational History  .  Occupation: retired  Scientific laboratory technician  . Financial resource strain: Not on file  . Food insecurity:    Worry: Not on file    Inability: Not on file  . Transportation needs:    Medical: Not on file    Non-medical: Not on file  Tobacco Use  . Smoking status: Never Smoker  . Smokeless tobacco: Never Used  Substance and Sexual Activity  . Alcohol use: Yes    Alcohol/week: 1.0 standard drinks    Types: 1 Cans of beer per week  . Drug use: No  . Sexual activity: Not on file  Lifestyle  . Physical activity:    Days per week: Not on file    Minutes per session: Not on file  . Stress: Not on file  Relationships   . Social connections:    Talks on phone: Not on file    Gets together: Not on file    Attends religious service: Not on file    Active member of club or organization: Not on file    Attends meetings of clubs or organizations: Not on file    Relationship status: Not on file  . Intimate partner violence:    Fear of current or ex partner: Not on file    Emotionally abused: Not on file    Physically abused: Not on file    Forced sexual activity: Not on file  Other Topics Concern  . Not on file  Social History Narrative  . Not on file    Family History:  Family History  Problem Relation Age of Onset  . Kidney cancer Father   . Colon cancer Father   . Cancer Brother        ?  Chad Norris Heart attack Brother 16  . Heart attack Sister 27  . Breast cancer Sister   . Diabetes Brother   . Alcohol abuse Brother   . Colon polyps Brother     Medications:   Current Outpatient Medications on File Prior to Visit  Medication Sig Dispense Refill  . acetaminophen (TYLENOL) 500 MG tablet Take 1,000 mg by mouth every 6 (six) hours as needed (pain).    Chad Norris atorvastatin (LIPITOR) 40 MG tablet Take 1 tablet (40 mg total) by mouth daily at 6 PM. 90 tablet 1  . Multiple Vitamins-Minerals (ADULT ONE DAILY GUMMIES) CHEW Chew 2 tablets by mouth daily. men     No current facility-administered medications on file prior to visit.     Allergies:  No Known Allergies   Physical Exam  Vitals:   10/02/18 1452  BP: 118/68  Pulse: 63  Weight: 212 lb (96.2 kg)  Height: 6' (1.829 m)   Body mass index is 28.75 kg/m. No exam data present  Depression screen [facility] 2/9 10/02/2018  Decreased Interest 0  Down, Depressed, Hopeless 0  PHQ - 2 Score 0     General: well developed, well nourished, very pleasant elderly Caucasian male, seated, in no evident distress Head: head normocephalic and atraumatic.   Neck: supple with no carotid or supraclavicular bruits Cardiovascular: regular rate and rhythm, no murmurs  Musculoskeletal: no deformity Skin:  no rash/petichiae Vascular:  Normal pulses all extremities  Neurologic Exam Mental Status: Awake and fully alert. Oriented to place and time. Recent and remote memory intact. Attention span, concentration and fund of knowledge appropriate. Mood and affect appropriate.  Cranial Nerves: Fundoscopic exam reveals sharp disc margins. Pupils equal, briskly reactive to light. Extraocular movements full without nystagmus. Visual fields full  to confrontation. Hearing intact. Facial sensation intact. Face, tongue, palate moves normally and symmetrically.  Motor: Normal bulk and tone. Normal strength in all tested extremity muscles. Sensory.: intact to touch , pinprick , position and vibratory sensation.  Coordination: Rapid alternating movements normal in all extremities. Finger-to-nose and heel-to-shin performed accurately bilaterally. Gait and Station: Arises from chair without difficulty. Stance is normal. Gait demonstrates normal stride length and balance. Able to heel, toe and tandem walk without difficulty.  Romberg negative. Reflexes: 1+ and symmetric. Toes downgoing.    NIHSS  0 Modified Rankin  0    Diagnostic Data (Labs, Imaging, Testing)  CT HEAD WO CONTRAST 08/18/2018 IMPRESSION: 1. No intracranial hemorrhage. 2. Chronic microvascular changes versus small infarct of indeterminate age posterior superior left lenticular nucleus. 3. Mild global atrophy most notable parietal lobes.  MR BRAIN WO CONTRAST MR MRA HEAD  MR MRA NECK 08/18/2018 IMPRESSION: 1. Acute nonhemorrhagic infarct involving the posterior left lentiform nucleus measuring up to 1.5 cm. 2. MR brain otherwise normal for age. 3. Occluded proximal left A1 segment. 4. Both ACA vessels fill from the right with asymmetric flow to the left ACA. 5. Normal MRA of the neck.  ECHOCARDIOGRAM 08/19/2018 IMPRESSIONS  1. The left ventricle appears to be normal in size, has normal wall  thickness 50-55% ejection fraction Spectral Doppler shows indeterminate pattern of diastolic filling.  2. There is mild hypokinesis of the basiliar-mid anteroseptal left ventricular segment.  3. Septal hypokinesis is likely due to LBBB.  4. Right ventricular systolic pressure is is normal.  5. The right ventricle has normal size and normal systolic function.  6. Normal left atrial size.  7. Normal right atrial size.  8. Mitral valve regurgitation is trivial by color flow Doppler.  9. The mitral valve normal in structure and function. 10. Normal tricuspid valve. 11. Aortic valve normal. 12. No atrial level shunt detected by color flow Doppler.  MR BRAIN WO CONTRAST 09/26/2018 IMPRESSION: 1. New/recrudescent 9 mm acute/early subacute infarction at the superior margin of now chronic infarct in left lentiform nucleus. No hemorrhage or mass effect. 2. Stable mild chronic microvascular ischemic changes and volume loss of the brain.   ASSESSMENT: Chad Norris is a 85 y.o. 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 Vascular risk factors include HTN and HLD.  He is being seen today for hospital follow-up and overall has recovered well without residual deficits or recurring symptoms.    PLAN:  1. Left lentiform nucleus infarct with expansion: Continue aspirin 81 mg daily and clopidogrel 75 mg daily  and atorvastatin for secondary stroke prevention.  Advised to continue DAPT for an additional 2 weeks and then continue on Plavix alone for secondary stroke prevention.  Maintain strict control of hypertension with blood pressure goal below 130/90, diabetes with hemoglobin A1c goal below 6.5% and cholesterol with LDL cholesterol (bad cholesterol) goal below 70 mg/dL.  I also advised the patient to eat a healthy diet with plenty of whole grains, cereals, fruits and vegetables, exercise regularly with at least 30 minutes  of continuous activity daily and maintain ideal body weight. 2. HTN: Advised to continue current treatment regimen.  Today's BP 118/68.  Advised to continue to monitor at home along with continued follow-up with PCP for management 3. HLD: Advised to continue current treatment regimen along with continued follow-up with PCP for future prescribing and monitoring of lipid panel.  Lipid panel obtained at  today's appointment to ensure adequate management with initiation of statin but did request PCP to monitor in the future    Follow up in 6 months or call earlier if needed   Greater than 50% of time during this 25 minute visit was spent on counseling, explanation of diagnosis of left lentiform nucleus infarct, reviewing risk factor management of HTN and HLD, planning of further management along with potential future management, and discussion with patient and family answering all questions.    Venancio Poisson, AGNP-BC  [facility] [address] [place], [place] [address]  Phone [phone] Fax [phone] Note: This document was prepared with digital dictation and possible smart phrase technology. Any transcriptional errors that result from this process are unintentional.

## 2018-10-03 LAB — LIPID PANEL
Chol/HDL Ratio: 3.1 ratio (ref 0.0–5.0)
Cholesterol, Total: 107 mg/dL (ref 100–199)
HDL: 34 mg/dL — ABNORMAL LOW (ref >39)
LDL Calculated: 48 mg/dL (ref 0–99)
Triglycerides: 123 mg/dL (ref 0–149)
VLDL Cholesterol Cal: 25 mg/dL (ref 5–40)

## 2018-10-03 NOTE — Progress Notes (Signed)
I agree with the above plan 

## 2018-10-08 ENCOUNTER — Inpatient Hospital Stay: Payer: Medicare Other | Admitting: Adult Health

## 2019-02-03 ENCOUNTER — Encounter: Payer: Self-pay | Admitting: *Deleted

## 2019-02-03 ENCOUNTER — Telehealth: Payer: Self-pay | Admitting: Adult Health

## 2019-02-03 NOTE — Telephone Encounter (Signed)
Patient is considered moderate risk for PETKK-44 complications therefore it would be recommended for him to avoid unnecessary travel at this time

## 2019-02-03 NOTE — Telephone Encounter (Signed)
Pts wife [name] called in and stated they need a letter stating pt is high risk of virus and will not be able to travel , in order for them to receive a refund from their family beach trip they had to cancel.  email- [email or web address]

## 2019-02-03 NOTE — Telephone Encounter (Signed)
Letter written, to JV/NP for signature.

## 2019-02-05 ENCOUNTER — Telehealth: Payer: Self-pay

## 2019-02-05 DIAGNOSIS — H903 Sensorineural hearing loss, bilateral: Secondary | ICD-10-CM | POA: Diagnosis not present

## 2019-02-05 DIAGNOSIS — Z9621 Cochlear implant status: Secondary | ICD-10-CM

## 2019-02-05 DIAGNOSIS — Z4889 Encounter for other specified surgical aftercare: Secondary | ICD-10-CM

## 2019-02-05 NOTE — Addendum Note (Signed)
Addended by: Nathanial Millman E on: 02/05/2019 01:16 PM   Modules accepted: Orders

## 2019-02-05 NOTE — Telephone Encounter (Signed)
Ok to place referral.

## 2019-02-05 NOTE — Telephone Encounter (Signed)
Referral placed.

## 2019-02-05 NOTE — Telephone Encounter (Signed)
Copied from [place] [phone]. Topic: Referral - Request for Referral >> Feb 05, 2019 11:09 AM Richardo Priest, NT wrote: Has patient seen PCP for this complaint? Yes *If NO, is insurance requiring patient see PCP for this issue before PCP can refer them? Referral for which specialty: Audiology  Preferred provider/office: fax: [phone] with Attention to [place]. If more info needed please call [phone].  Reason for referral: Patient is candidate for cochlear implant

## 2019-02-06 ENCOUNTER — Telehealth: Payer: Self-pay | Admitting: Family Medicine

## 2019-02-06 NOTE — Telephone Encounter (Addendum)
Patient came into office requesting a letter be written for trip that he was not able to go on, so he can get his money back. Patient  also wants you to know that another office will be requesting information about him since he is having trouble hearing. Patient requesting nurse call him to discuss further.

## 2019-02-09 NOTE — Telephone Encounter (Signed)
Called Pt to ask questions, LAM

## 2019-02-09 NOTE — Telephone Encounter (Signed)
Please obtain more info, type of trip/date of trip/etc.

## 2019-02-12 NOTE — Telephone Encounter (Signed)
Call to Pt have been attempted unsuccessful. [employer]. Waiting on response. Will try to contact Pt on Monday.

## 2019-03-18 ENCOUNTER — Ambulatory Visit: Payer: Medicare Other | Admitting: Orthopedic Surgery

## 2019-03-18 ENCOUNTER — Encounter: Payer: Self-pay | Admitting: Orthopedic Surgery

## 2019-03-18 ENCOUNTER — Ambulatory Visit: Payer: Self-pay

## 2019-03-18 DIAGNOSIS — M25511 Pain in right shoulder: Secondary | ICD-10-CM

## 2019-03-18 NOTE — Progress Notes (Unsigned)
Office Visit Note   Patient: Chad Norris           Date of Birth: 17-Nov-1931           MRN: [id number] Visit Date: 03/18/2019 Requested by: Luetta Nutting, DO [place],  [place] [id number] PCP: Luetta Nutting, DO  Subjective: Chief Complaint  Patient presents with  . Right Shoulder - Pain  . Left Shoulder - Pain    HPI: Chad Norris is a 85 y.o. male who presents to the office complaining of bilateral shoulder pain.  Patient notes ongoing bilateral shoulder pain that is worse in the morning.  Patient states that the right shoulder hurts worse than the left shoulder ever since an injury while cutting weeds last week where he felt a pop in his right shoulder while working outside.  His main complaint is pain but he also notes stiffness that is worse in the morning and steadily improves throughout the day.  His pain wakes him up at night.  He also reports some weakness but this is not a major concern.  He takes Tylenol at night for the pain and uses a heating pad.  He does not take any NSAIDs.  He is on Plavix.  Has a history of a 2 level C-spine fusion.  And he had a stroke in January 2020 that caused some left side arm weakness and right leg weakness.              ROS:  All systems reviewed are negative as they relate to the chief complaint within the history of present illness.  Patient denies fevers or chills.  Assessment & Plan: Visit Diagnoses:  1. Bilateral shoulder pain, unspecified chronicity     Plan: Patient is an 85 year old male who presents complaint of bilateral shoulder pain.  His right shoulder is causing him more pain on a daily basis especially since the injury that he sustained while cutting weeds or he felt a pop.  He has some minor supraspinatus weakness on the right shoulder on exam when compared to the left but overall his strength is good and his range of motion is good.  His history and exam points to shoulder impingement or impending tendon  rupture as a source of his pain.  Right were negative for any acute pathology such as fracture or dislocation.  Offered injection of the bilateral shoulders today in the clinic.  Patient responded that he only wanted the right shoulder injected and he would see how that goes.  Patient tolerated injection well and will follow-up with the office in 4 weeks.  Follow-Up Instructions: No follow-ups on file.   Orders:  Orders Placed This Encounter  Procedures  . XR Shoulder Right  . XR Shoulder Left   No orders of the defined types were placed in this encounter.     Procedures: Large Joint Inj: R subacromial bursa on 03/18/2019 8:50 PM Indications: diagnostic evaluation and pain Details: 18 G 1.5 in needle, posterior approach  Arthrogram: No  Medications: 9 mL bupivacaine 0.5 %; 40 mg methylPREDNISolone acetate 40 MG/ML; 5 mL lidocaine 1 % Outcome: tolerated well, no immediate complications Procedure, treatment alternatives, risks and benefits explained, specific risks discussed. Consent was given by the patient. Immediately prior to procedure a time out was called to verify the correct patient, procedure, equipment, support staff and site/side marked as required. Patient was prepped and draped in the usual sterile fashion.       Clinical Data: No  additional findings.  Objective: Vital Signs: There were no vitals taken for this visit.  Physical Exam:  Constitutional: Patient appears well-developed HEENT:  Head: Normocephalic Eyes:EOM are normal Neck: Normal range of motion Cardiovascular: Normal rate Pulmonary/chest: Effort normal Neurologic: Patient is alert Skin: Skin is warm Psychiatric: Patient has normal mood and affect  Ortho Exam:  Bilateral shoulder Exam Able to fully forward flex and abduct shoulder overhead No loss of ER.  Good endpoint with ER No TTP over the [facility] joint bilaterally TTP over the bicipital groove, more so on the right. 5/5 motor strength of the  subscapularis and infraspinatus bilaterally.  4/5 motor strength of the supraspinatus on the right, 5/5 motor strength on the left Positive Hawkins impingement bilaterally, significantly more painful on the right 5/5 grip strength, forearm pronation/supination, and bicep strength  Specialty Comments:  No specialty comments available.  Imaging: No results found.   PMFS History: Patient Active Problem List   Diagnosis Date Noted  . Acute CVA (cerebrovascular accident) ([place]) 08/19/2018  . Acute ischemic stroke ([place]) 08/18/2018  . Stroke ([place]) 08/18/2018  . Sore throat 06/25/2018  . Well adult exam 03/27/2018  . Hyperlipidemia 03/27/2018  . Basal cell carcinoma (BCC) of [place] 03/11/2017  . Prediabetes 03/11/2017  . DDD (degenerative disc disease), lumbosacral 01/13/2014  . NONSPECIFIC ABNORMAL ELECTROCARDIOGRAM 06/21/2008  . PROSTATE CANCER, HX OF 06/21/2008  . COLONIC POLYPS, HX OF 06/21/2008  . DIVERTICULOSIS, COLON 05/07/2008  . NEPHROLITHIASIS, HX OF 05/07/2008  . History of gout 10/09/2007   Past Medical History:  Diagnosis Date  . Colon polyps   . Diverticulosis   . Gout   . Hemorrhoids   . Hyperlipemia   . Nephrolithiasis   . Prostate cancer ([place])   . Stroke [facility])     Family History  Problem Relation Age of Onset  . Kidney cancer Father   . Colon cancer Father   . Cancer Brother        ?  [name] Heart attack Brother 29  . Heart attack Sister 41  . Breast cancer Sister   . Diabetes Brother   . Alcohol abuse Brother   . Colon polyps Brother     Past Surgical History:  Procedure Laterality Date  . CATARACT EXTRACTION W/ INTRAOCULAR LENS  IMPLANT, BILATERAL     bi-lat  . CERVICAL SPINE SURGERY    . COLONOSCOPY W/ POLYPECTOMY    . Epidural steroid injections     3-4, Dr Ellene Route  . RETROPUBIC PROSTATECTOMY    . SHOULDER SURGERY     left   Social History   Occupational History  . Occupation: retired  Tobacco Use  . Smoking status: Never Smoker  .  Smokeless tobacco: Never Used  Substance and Sexual Activity  . Alcohol use: Yes    Alcohol/week: 1.0 standard drinks    Types: 1 Cans of beer per week  . Drug use: No  . Sexual activity: Not on file

## 2019-03-27 ENCOUNTER — Other Ambulatory Visit: Payer: Self-pay | Admitting: Family Medicine

## 2019-03-29 ENCOUNTER — Encounter: Payer: Self-pay | Admitting: Family Medicine

## 2019-04-01 ENCOUNTER — Encounter: Payer: Self-pay | Admitting: Family Medicine

## 2019-04-01 ENCOUNTER — Ambulatory Visit (INDEPENDENT_AMBULATORY_CARE_PROVIDER_SITE_OTHER): Payer: Medicare Other | Admitting: Behavioral Health

## 2019-04-01 DIAGNOSIS — Z23 Encounter for immunization: Secondary | ICD-10-CM

## 2019-04-01 NOTE — Progress Notes (Signed)
Patient presents in clinic today for Influenza vaccination. IM injection was given in the right deltoid. Patient tolerated the injection well. No signs or symptoms of a reaction were noted prior to patient leaving the nurse visit.

## 2019-04-07 DIAGNOSIS — H903 Sensorineural hearing loss, bilateral: Secondary | ICD-10-CM | POA: Diagnosis not present

## 2019-04-07 DIAGNOSIS — Z974 Presence of external hearing-aid: Secondary | ICD-10-CM | POA: Diagnosis not present

## 2019-04-09 ENCOUNTER — Ambulatory Visit: Payer: Medicare Other | Admitting: Adult Health

## 2019-04-09 ENCOUNTER — Encounter: Payer: Self-pay | Admitting: Adult Health

## 2019-04-09 ENCOUNTER — Other Ambulatory Visit: Payer: Self-pay

## 2019-04-09 VITALS — BP 123/72 | HR 65 | Temp 98.4°F | Ht 72.0 in | Wt 206.4 lb

## 2019-04-09 DIAGNOSIS — I10 Essential (primary) hypertension: Secondary | ICD-10-CM

## 2019-04-09 DIAGNOSIS — E785 Hyperlipidemia, unspecified: Secondary | ICD-10-CM | POA: Diagnosis not present

## 2019-04-09 DIAGNOSIS — I63512 Cerebral infarction due to unspecified occlusion or stenosis of left middle cerebral artery: Secondary | ICD-10-CM | POA: Diagnosis not present

## 2019-04-09 MED ORDER — CLOPIDOGREL BISULFATE 75 MG PO TABS: 75.0000 mg | ORAL_TABLET | Freq: Every day | ORAL | 3 refills | Status: DC

## 2019-04-09 NOTE — Progress Notes (Signed)
[name] [address] [place]. [place] [address] [phone]       OFFICE FOLLOW UP NOTE  [name] Date of Birth:  [birth year] Medical Record Number:  [id number]   Reason for visit: Left BG/CR stroke 07/2018  CHIEF COMPLAINT:  Chief Complaint  Patient presents with  . Follow-up    Treatment Room, with wife. Hx of stroke f/u. "Wants to discuss a procedure."    HPI:  Stroke admission 08/18/2018: [name] is a 85 y.o. male with history of HLD  who presented with slurred speech, generalized weakness and blurred vision.  CT head reviewed and was negative for hemorrhage but did show old left lenticular nucleus infarct versus chronic microvascular changes and atrophy.  MRI brain reviewed showed acute left lentiform nucleus infarct.  MRA head showed occluded left A1, both ACAs fill from the right and decreased flow left ICA.  MRA neck normal.  2D echo showed an EF of 50 to 55%.  LDL 135 and initiated atorvastatin 40 mg daily.  A1c satisfactory at 5.8.  No antithrombotic PTA and recommended aspirin and Plavix for 3 weeks and aspirin alone.  Other stroke risk factors include advanced age, EtOH use and potential OSA.  He was discharged home in stable condition without therapy needs.  Initial visit 10/01/2018: He is being seen today for hospital follow-up and unfortunately return to ED on 09/26/2018 with symptoms of right-sided numbness and imaging did not show acute 9 mm infarct at margin of chronic infarct.  He has been doing well since this time without any residual or recurring symptoms.  He was restarted on 3 weeks DAPT which he continues at this time without side effects of bleeding or bruising.  Continues on atorvastatin without side effects myalgias.  Blood pressure today satisfactory 118/68.  He remains very active within the community where he is on the board of homeowners association along with frequently playing golf.  He also has drastically changed his diet  with assistance of his wife.  He was evaluated by [name] yesterday, 10/01/2018, for potential OSA evaluation but at this time patient declines undergoing sleep study.  No further concerns at this time.  Denies new or worsening stroke/TIA symptoms.  Update 04/09/2019: [name] is being seen today for follow-up regarding prior stroke accompanied by his wife.  He has been doing well since prior visit without residual deficits or recurring symptoms.  Continues on Plavix and atorvastatin for secondary stroke prevention without side effects.  Blood pressure today satisfactory at 123/72.  He is currently being evaluated by [facility] audiology in regards to potentially undergoing cochlear implant device procedure.  He is questioning safety of pursuing procedure from a neurological standpoint.  No further concerns at this time.     ROS:   14 system review of systems performed and negative with exception of hearing loss  PMH:  Past Medical History:  Diagnosis Date  . Colon polyps   . Diverticulosis   . Gout   . Hemorrhoids   . Hyperlipemia   . Nephrolithiasis   . Prostate cancer ([place])   . Stroke [facility])     PSH:  Past Surgical History:  Procedure Laterality Date  . CATARACT EXTRACTION W/ INTRAOCULAR LENS  IMPLANT, BILATERAL     bi-lat  . CERVICAL SPINE SURGERY    . COLONOSCOPY W/ POLYPECTOMY    . Epidural steroid injections     3-4, Dr Ellene Route  . RETROPUBIC PROSTATECTOMY    . SHOULDER  SURGERY     left    Social History:  Social History   Socioeconomic History  . Marital status: Married    Spouse name: Not on file  . Number of children: 2  . Years of education: Not on file  . Highest education level: Not on file  Occupational History  . Occupation: retired  Scientific laboratory technician  . Financial resource strain: Not on file  . Food insecurity    Worry: Not on file    Inability: Not on file  . Transportation needs    Medical: Not on file    Non-medical: Not on file  Tobacco Use  .  Smoking status: Never Smoker  . Smokeless tobacco: Never Used  Substance and Sexual Activity  . Alcohol use: Yes    Alcohol/week: 1.0 standard drinks    Types: 1 Cans of beer per week  . Drug use: No  . Sexual activity: Not on file  Lifestyle  . Physical activity    Days per week: Not on file    Minutes per session: Not on file  . Stress: Not on file  Relationships  . Social Herbalist on phone: Not on file    Gets together: Not on file    Attends religious service: Not on file    Active member of club or organization: Not on file    Attends meetings of clubs or organizations: Not on file    Relationship status: Not on file  . Intimate partner violence    Fear of current or ex partner: Not on file    Emotionally abused: Not on file    Physically abused: Not on file    Forced sexual activity: Not on file  Other Topics Concern  . Not on file  Social History Narrative  . Not on file    Family History:  Family History  Problem Relation Age of Onset  . Kidney cancer Father   . Colon cancer Father   . Cancer Brother        ?  [name] Heart attack Brother 94  . Heart attack Sister 35  . Breast cancer Sister   . Diabetes Brother   . Alcohol abuse Brother   . Colon polyps Brother     Medications:   Current Outpatient Medications on File Prior to Visit  Medication Sig Dispense Refill  . acetaminophen (TYLENOL) 500 MG tablet Take 1,000 mg by mouth every 6 (six) hours as needed (pain).    [name] atorvastatin (LIPITOR) 40 MG tablet TAKE 1 TABLET(40 MG) BY MOUTH DAILY AT 6 PM 90 tablet 1  . Multiple Vitamins-Minerals (ADULT ONE DAILY GUMMIES) CHEW Chew 2 tablets by mouth daily. men     No current facility-administered medications on file prior to visit.     Allergies:  No Known Allergies   Physical Exam  Vitals:   04/09/19 1457  BP: 123/72  Pulse: 65  Temp: 98.4 F (36.9 C)  Weight: 206 lb 6.4 oz (93.6 kg)  Height: 6' (1.829 m)   Body mass index is 27.99 kg/m.  No exam data present  Depression screen [facility] 2/9 10/02/2018  Decreased Interest 0  Down, Depressed, Hopeless 0  PHQ - 2 Score 0     General: well developed, well nourished, very pleasant elderly Caucasian male, seated, in no evident distress Head: head normocephalic and atraumatic.   Neck: supple with no carotid or supraclavicular bruits Cardiovascular: regular rate and rhythm, no murmurs Musculoskeletal: no deformity  Skin:  no rash/petichiae Vascular:  Normal pulses all extremities  Neurologic Exam Mental Status: Awake and fully alert. Oriented to place and time. Recent and remote memory intact. Attention span, concentration and fund of knowledge appropriate. Mood and affect appropriate.  Cranial Nerves: Pupils equal, briskly reactive to light. Extraocular movements full without nystagmus. Visual fields full to confrontation.  HOH bilaterally. Facial sensation intact. Face, tongue, palate moves normally and symmetrically.  Motor: Normal bulk and tone. Normal strength in all tested extremity muscles. Sensory.: intact to touch , pinprick , position and vibratory sensation.  Coordination: Rapid alternating movements normal in all extremities. Finger-to-nose and heel-to-shin performed accurately bilaterally. Gait and Station: Arises from chair without difficulty. Stance is normal. Gait demonstrates normal stride length and balance. Able to heel, toe and tandem walk without difficulty.  Romberg negative. Reflexes: 1+ and symmetric. Toes downgoing.      Diagnostic Data (Labs, Imaging, Testing)  CT HEAD WO CONTRAST 08/18/2018 IMPRESSION: 1. No intracranial hemorrhage. 2. Chronic microvascular changes versus small infarct of indeterminate age posterior superior left lenticular nucleus. 3. Mild global atrophy most notable parietal lobes.  MR BRAIN WO CONTRAST MR MRA HEAD  MR MRA NECK 08/18/2018 IMPRESSION: 1. Acute nonhemorrhagic infarct involving the posterior left lentiform nucleus  measuring up to 1.5 cm. 2. MR brain otherwise normal for age. 3. Occluded proximal left A1 segment. 4. Both ACA vessels fill from the right with asymmetric flow to the left ACA. 5. Normal MRA of the neck.  ECHOCARDIOGRAM 08/19/2018 IMPRESSIONS  1. The left ventricle appears to be normal in size, has normal wall thickness 50-55% ejection fraction Spectral Doppler shows indeterminate pattern of diastolic filling.  2. There is mild hypokinesis of the basiliar-mid anteroseptal left ventricular segment.  3. Septal hypokinesis is likely due to LBBB.  4. Right ventricular systolic pressure is is normal.  5. The right ventricle has normal size and normal systolic function.  6. Normal left atrial size.  7. Normal right atrial size.  8. Mitral valve regurgitation is trivial by color flow Doppler.  9. The mitral valve normal in structure and function. 10. Normal tricuspid valve. 11. Aortic valve normal. 12. No atrial level shunt detected by color flow Doppler.  MR BRAIN WO CONTRAST 09/26/2018 IMPRESSION: 1. New/recrudescent 9 mm acute/early subacute infarction at the superior margin of now chronic infarct in left lentiform nucleus. No hemorrhage or mass effect. 2. Stable mild chronic microvascular ischemic changes and volume loss of the brain.   ASSESSMENT: Chad Norris is a 85 y.o. 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 Vascular risk factors include HTN and HLD.  Recovered well from a stroke standpoint without residual deficits or reoccurring of symptoms.    PLAN:  1. Left lentiform nucleus infarct with expansion: Continue clopidogrel 75 mg daily  and atorvastatin for secondary stroke prevention. Maintain strict control of hypertension with blood pressure goal below 130/90, diabetes with hemoglobin A1c goal below 6.5% and cholesterol with LDL cholesterol (bad cholesterol) goal below 70 mg/dL.  I also  advised the patient to eat a healthy diet with plenty of whole grains, cereals, fruits and vegetables, exercise regularly with at least 30 minutes of continuous activity daily and maintain ideal body weight. 2. HTN: Advised to continue current treatment regimen.  Advised to continue to monitor at home along with continued follow-up with PCP for management 3. HLD: Advised to continue current treatment regimen along with continued follow-up  with PCP for future prescribing and monitoring of lipid panel.  Lipid panel obtained at today's visit per patient request as it has not been checked since prior visit to ensure adequate management with current statin 4. Cochlear implant: Discussion with patient and wife regarding undergoing procedure.  He has recovered well from his stroke without residual deficits and has been stable since that time.  With his ongoing hearing loss which affects his daily functioning and quality of life, he is cleared from a neurological standpoint to pursue further evaluation by audiology for potential procedure.  This procedure will likely greatly benefit patient.  He requested information be forwarded to Dr. Berdie Ogren who is currently evaluating him at [facility] audiology   Follow-up in 4 months or call earlier if needed -if stable at that time, can be discharged   Greater than 50% of time during this 25 minute visit was spent on discussing undergoing cochlear implant, discussion of left lentiform nucleus infarct, reviewing risk factor management of HTN and HLD, planning of further management along with potential future management, and discussion with patient and family answering all questions.    Frann Rider, AGNP-BC  [facility] [address] [place], [place] [address]  Phone [phone] Fax [phone] Note: This document was prepared with digital dictation and possible smart phrase technology. Any transcriptional errors that  result from this process are unintentional.

## 2019-04-09 NOTE — Patient Instructions (Signed)
As you have been doing well from a stroke standpoint, I do not see any reason for concern with you pursuing cochlear implant.  I will reach out to Dr. Berdie Ogren regarding today's visit.  Continue clopidogrel 75 mg daily (Plavix) and Lipitor for secondary stroke prevention  We will check cholesterol levels today  Continue to follow up with PCP regarding cholesterol and blood pressure management   Continue to monitor blood pressure at home  Maintain strict control of hypertension with blood pressure goal below 130/90, diabetes with hemoglobin A1c goal below 6.5% and cholesterol with LDL cholesterol (bad cholesterol) goal below 70 mg/dL. I also advised the patient to eat a healthy diet with plenty of whole grains, cereals, fruits and vegetables, exercise regularly and maintain ideal body weight.  Followup in the future with me in 4 months or call earlier if needed       Thank you for coming to see [place] at [facility]. I hope we have been able to provide you high quality care today.  You may receive a patient satisfaction survey over the next few weeks. We would appreciate your feedback and comments so that we may continue to improve ourselves and the health of our patients.

## 2019-04-10 LAB — LIPID PANEL
Chol/HDL Ratio: 3.1 ratio (ref 0.0–5.0)
Cholesterol, Total: 127 mg/dL (ref 100–199)
HDL: 41 mg/dL (ref >39)
LDL Chol Calc (NIH): 65 mg/dL (ref 0–99)
Triglycerides: 118 mg/dL (ref 0–149)
VLDL Cholesterol Cal: 21 mg/dL (ref 5–40)

## 2019-04-12 ENCOUNTER — Encounter: Payer: Self-pay | Admitting: Adult Health

## 2019-04-13 NOTE — Progress Notes (Signed)
Fax confirmation received for [facility] [phone].

## 2019-04-13 NOTE — Progress Notes (Signed)
I agree with the above plan 

## 2019-04-15 ENCOUNTER — Other Ambulatory Visit: Payer: Self-pay

## 2019-04-15 ENCOUNTER — Ambulatory Visit (INDEPENDENT_AMBULATORY_CARE_PROVIDER_SITE_OTHER): Payer: Medicare Other | Admitting: Orthopedic Surgery

## 2019-04-15 ENCOUNTER — Encounter: Payer: Self-pay | Admitting: Orthopedic Surgery

## 2019-04-15 DIAGNOSIS — M25511 Pain in right shoulder: Secondary | ICD-10-CM | POA: Diagnosis not present

## 2019-04-15 DIAGNOSIS — M12811 Other specific arthropathies, not elsewhere classified, right shoulder: Secondary | ICD-10-CM | POA: Diagnosis not present

## 2019-04-15 DIAGNOSIS — M12812 Other specific arthropathies, not elsewhere classified, left shoulder: Secondary | ICD-10-CM | POA: Diagnosis not present

## 2019-04-15 DIAGNOSIS — M25512 Pain in left shoulder: Secondary | ICD-10-CM | POA: Diagnosis not present

## 2019-04-19 ENCOUNTER — Encounter: Payer: Self-pay | Admitting: Orthopedic Surgery

## 2019-04-19 DIAGNOSIS — M25512 Pain in left shoulder: Secondary | ICD-10-CM | POA: Diagnosis not present

## 2019-04-19 DIAGNOSIS — M12812 Other specific arthropathies, not elsewhere classified, left shoulder: Secondary | ICD-10-CM

## 2019-04-19 MED ORDER — METHYLPREDNISOLONE ACETATE 40 MG/ML IJ SUSP
40.0000 mg | INTRAMUSCULAR | Status: AC | PRN
  Administered 2019-04-19: 40 mg via INTRA_ARTICULAR

## 2019-04-19 MED ORDER — BUPIVACAINE HCL 0.5 % IJ SOLN
9.0000 mL | INTRAMUSCULAR | Status: AC | PRN
  Administered 2019-04-19: 9 mL via INTRA_ARTICULAR

## 2019-04-19 MED ORDER — LIDOCAINE HCL 1 % IJ SOLN
5.0000 mL | INTRAMUSCULAR | Status: AC | PRN
  Administered 2019-04-19: 5 mL

## 2019-04-19 NOTE — Progress Notes (Signed)
Office Visit Note   Patient: Chad Norris           Date of Birth: [birth year]           MRN: [id number] Visit Date: 04/15/2019 Requested by: Chad Nutting, DO [place],  [place] [address] PCP: Chad Nutting, DO  Subjective: Chief Complaint  Patient presents with  . Right Shoulder - Pain  . Left Shoulder - Pain    HPI: Chad Norris is a patient with bilateral shoulder pain.  He is very active.  Patient states that in the left shoulder the pain is becoming significantly worse.  Reports waking from sleep at night.  Would like to get an injection into his left shoulder.  Patient likely has a component of rotator cuff arthropathy.  Right shoulder is doing well since the injection 4 weeks ago.  Denies any interval history of injury.              ROS: All systems reviewed are negative as they relate to the chief complaint within the history of present illness.  Patient denies  fevers or chills.   Assessment & Plan: Visit Diagnoses:  1. Bilateral shoulder pain, unspecified chronicity     Plan: Impression is bilateral shoulder pain with right shoulder doing well following injection.  We will inject the left shoulder today as well to see if that helps.  56-month return for consideration of further injections.  He is functional enough now that he wants to avoid intervention.  Follow-Up Instructions: Return in about 6 months (around 10/13/2019).   Orders:  No orders of the defined types were placed in this encounter.  No orders of the defined types were placed in this encounter.     Procedures: Large Joint Inj: L glenohumeral on 04/19/2019 6:19 PM Indications: diagnostic evaluation and pain Details: 18 G 1.5 in needle, posterior approach  Arthrogram: No  Medications: 9 mL bupivacaine 0.5 %; 40 mg methylPREDNISolone acetate 40 MG/ML; 5 mL lidocaine 1 % Outcome: tolerated well, no immediate complications Procedure, treatment alternatives, risks and benefits explained,  specific risks discussed. Consent was given by the patient. Immediately prior to procedure a time out was called to verify the correct patient, procedure, equipment, support staff and site/side marked as required. Patient was prepped and draped in the usual sterile fashion.       Clinical Data: No additional findings.  Objective: Vital Signs: There were no vitals taken for this visit.  Physical Exam:   Constitutional: Patient appears well-developed HEENT:  Head: Normocephalic Eyes:EOM are normal Neck: Normal range of motion Cardiovascular: Normal rate Pulmonary/chest: Effort normal Neurologic: Patient is alert Skin: Skin is warm Psychiatric: Patient has normal mood and affect    Ortho Exam: Ortho exam demonstrates full active and passive range of motion of the cervical spine.  He has some weakness to rotator cuff testing in both shoulders.  Subscap strength is pretty reasonable in both shoulders.  Does have positive impingement signs and some grinding with internal/external rotation of the shoulder.  Motor or sensory function of the hand is intact.  Specialty Comments:  No specialty comments available.  Imaging: No results found.   PMFS History: Patient Active Problem List   Diagnosis Date Noted  . Acute CVA (cerebrovascular accident) ([place]) 08/19/2018  . Acute ischemic stroke ([place]) 08/18/2018  . Stroke ([place]) 08/18/2018  . Sore throat 06/25/2018  . Well adult exam 03/27/2018  . Hyperlipidemia 03/27/2018  . Basal cell carcinoma (BCC) of ala nasi 03/11/2017  .  Prediabetes 03/11/2017  . DDD (degenerative disc disease), lumbosacral 01/13/2014  . NONSPECIFIC ABNORMAL ELECTROCARDIOGRAM 06/21/2008  . PROSTATE CANCER, HX OF 06/21/2008  . COLONIC POLYPS, HX OF 06/21/2008  . DIVERTICULOSIS, COLON 05/07/2008  . NEPHROLITHIASIS, HX OF 05/07/2008  . History of gout 10/09/2007   Past Medical History:  Diagnosis Date  . Colon polyps   . Diverticulosis   . Gout   .  Hemorrhoids   . Hyperlipemia   . Nephrolithiasis   . Prostate cancer ([place])   . Stroke [facility])     Family History  Problem Relation Age of Onset  . Kidney cancer Father   . Colon cancer Father   . Cancer Brother        ?  [name] Heart attack Brother 45  . Heart attack Sister 64  . Breast cancer Sister   . Diabetes Brother   . Alcohol abuse Brother   . Colon polyps Brother     Past Surgical History:  Procedure Laterality Date  . CATARACT EXTRACTION W/ INTRAOCULAR LENS  IMPLANT, BILATERAL     bi-lat  . CERVICAL SPINE SURGERY    . COLONOSCOPY W/ POLYPECTOMY    . Epidural steroid injections     3-4, Dr Ellene Route  . RETROPUBIC PROSTATECTOMY    . SHOULDER SURGERY     left   Social History   Occupational History  . Occupation: retired  Tobacco Use  . Smoking status: Never Smoker  . Smokeless tobacco: Never Used  Substance and Sexual Activity  . Alcohol use: Yes    Alcohol/week: 1.0 standard drinks    Types: 1 Cans of beer per week  . Drug use: No  . Sexual activity: Not on file

## 2019-05-11 ENCOUNTER — Ambulatory Visit: Payer: Self-pay | Admitting: *Deleted

## 2019-05-11 NOTE — Telephone Encounter (Signed)
Wife called in concerned about her husband.   He had a stroke in Jan.   Two weeks ago he had a tooth pulled.  He completed the antibiotics on Sunday.   On Thursday he woke up with a really bad sore throat.   There was also a knot near where the tooth had been pulled.  She called the dentist but hasn't heard back from him yet. She is now concerned about how weak he has suddenly become.   Two hours ago it hit him suddenly.  He can walk round but he is very weak.   His speech is unchanged from his baseline after the stroke per his wife.  I referred him to the ED.    Wife said he can walk out to the car so she's going to take him to [facility].  I let her know I would let Dr. Zigmund Daniel know.  I sent these triage notes to Dr. Luetta Nutting' office.    Reason for Disposition . [1] SEVERE weakness (i.e., unable to walk or barely able to walk, requires support) AND [2] new onset or worsening  Answer Assessment - Initial Assessment Questions 1. SYMPTOM: "What is the main symptom you are concerned about?" (e.g., weakness, numbness)     Wife calling in.   Stroke in Jan.   Had a tooth pulled 2 wks ago.  He has finished the antibiotics.   Thursday woke up with a bad sore throat and there is a knot near where the tooth was pulled.     No fever.   2. ONSET: "When did this start?" (minutes, hours, days; while sleeping)     Thursday.    He feels so weak now.    I think my gout is flaring up again.    He took a pill for the gout and now he is weak and shaky.   It's indomethacin 50 mg. 3. LAST NORMAL: "When was the last time you were normal (no symptoms)?"  He is so weak.   He is sitting in his chair with eyes closed.  Protocols used: NEUROLOGIC DEFICIT-A-AH

## 2019-05-12 ENCOUNTER — Encounter: Payer: Self-pay | Admitting: Family Medicine

## 2019-05-13 ENCOUNTER — Ambulatory Visit (INDEPENDENT_AMBULATORY_CARE_PROVIDER_SITE_OTHER): Payer: Medicare Other | Admitting: Family Medicine

## 2019-05-13 ENCOUNTER — Other Ambulatory Visit: Payer: Self-pay

## 2019-05-13 DIAGNOSIS — H905 Unspecified sensorineural hearing loss: Secondary | ICD-10-CM

## 2019-05-13 NOTE — Progress Notes (Signed)
Had a brief conversation with patient today rather than OV.  Patient reports that audiology has recommend cochlear implant for his hearing loss.  He states that he was told that he needs authorization from me to see ENT and have this completed.  He is unsure if he needs just a referral or needs pre-op exam.  We will reach out to Dr. Doreene Burke office to see what is needed.  If he needs a pre-op eval, we'll have him return to have this completed.

## 2019-05-21 DIAGNOSIS — H903 Sensorineural hearing loss, bilateral: Secondary | ICD-10-CM | POA: Diagnosis not present

## 2019-05-25 ENCOUNTER — Encounter: Payer: Self-pay | Admitting: Orthopedic Surgery

## 2019-05-25 NOTE — Telephone Encounter (Signed)
We could do injections in the shoulders about once every 4 months.  Another consideration would be gel injection in the shoulder but did have to pay for that out-of-pocket.  Short of that nothing else really to do particularly within the guidelines of your neurologist.

## 2019-05-26 ENCOUNTER — Encounter: Payer: Self-pay | Admitting: Orthopedic Surgery

## 2019-05-27 ENCOUNTER — Encounter: Payer: Self-pay | Admitting: Orthopedic Surgery

## 2019-05-28 ENCOUNTER — Encounter: Payer: Self-pay | Admitting: Orthopedic Surgery

## 2019-05-29 ENCOUNTER — Encounter: Payer: Self-pay | Admitting: Orthopedic Surgery

## 2019-05-29 NOTE — Telephone Encounter (Signed)
I would do synvisc 1 because it is a bigger volume ok to do with [place] asap pls claal thx  gelsyn volume too small

## 2019-06-01 ENCOUNTER — Encounter: Payer: Self-pay | Admitting: Orthopedic Surgery

## 2019-06-03 ENCOUNTER — Encounter: Payer: Self-pay | Admitting: Orthopedic Surgery

## 2019-06-03 NOTE — Telephone Encounter (Signed)
I looked at the note from September.  This is a clinical change from that time.  I would have to say that to make the best decision about whether or not there is a blood clot in his arm which would be unlikely but possible we would have to see him and evaluate him.  He was pretty functional with both shoulders and arms in September.  He was not in that much pain.  I would say repeat evaluation would be indicated just to try to sort it out.  I know he is interested in avoiding surgery but to help figure it out I think that have to see him again.

## 2019-06-04 ENCOUNTER — Encounter: Payer: Self-pay | Admitting: Adult Health

## 2019-06-04 ENCOUNTER — Encounter: Payer: Self-pay | Admitting: Orthopedic Surgery

## 2019-06-05 ENCOUNTER — Ambulatory Visit: Payer: Self-pay

## 2019-06-05 ENCOUNTER — Other Ambulatory Visit: Payer: Self-pay

## 2019-06-05 ENCOUNTER — Ambulatory Visit (INDEPENDENT_AMBULATORY_CARE_PROVIDER_SITE_OTHER): Payer: Medicare Other | Admitting: Orthopedic Surgery

## 2019-06-05 ENCOUNTER — Ambulatory Visit
Admission: RE | Admit: 2019-06-05 | Discharge: 2019-06-05 | Disposition: A | Discharge status: Home / Self Care | Payer: Medicare Other | Source: Ambulatory Visit | Attending: Orthopedic Surgery | Admitting: Orthopedic Surgery

## 2019-06-05 ENCOUNTER — Encounter: Payer: Self-pay | Admitting: Orthopedic Surgery

## 2019-06-05 ENCOUNTER — Telehealth: Payer: Self-pay | Admitting: Orthopedic Surgery

## 2019-06-05 DIAGNOSIS — M79601 Pain in right arm: Secondary | ICD-10-CM

## 2019-06-05 DIAGNOSIS — M4802 Spinal stenosis, cervical region: Secondary | ICD-10-CM | POA: Diagnosis not present

## 2019-06-05 NOTE — Telephone Encounter (Signed)
Appt has been cancelled.  

## 2019-06-05 NOTE — Progress Notes (Signed)
Pls rf to fn for c spine esi

## 2019-06-05 NOTE — Progress Notes (Signed)
Office Visit Note   Patient: Chad Norris           Date of Birth: [birth year]           MRN: [id number] Visit Date: 06/05/2019 Requested by: Luetta Nutting, [place],  [place] [address] PCP: Luetta Nutting, DO  Subjective: Chief Complaint  Patient presents with   Arm Pain    HPI: Chad Norris is a patient with known right shoulder arthritis.  Had a injection which did not give him much relief.  Reports neck pain as well as pain radiating up and down the arm with numbness and tingling in the right hand.  Also reports swelling in the right wrist.  Has a history of gout.  Really unable to use his arm due to pain in the shoulder.  Also reports a lot of tenderness and pain in that right wrist of 10 days duration.  Has a history of having a stroke in January of this year.  Not really an operative candidate for shoulder replacement.  He was scheduled for right shoulder gel injection with Dr. Ernestina Patches in early December but because of the cost of that injection in somewhat nonguaranteed pain relief aspect of the injection he is electing to cancel that.              ROS: All systems reviewed are negative as they relate to the chief complaint within the history of present illness.  Patient denies  fevers or chills.   Assessment & Plan: Visit Diagnoses:  1. Right arm pain     Plan: Impression is right shoulder pain with arthritis and minimal relief from cortisone injection.  I think the shoulder is something he is choosing to live with.  He wants to cancel gel injection and we can follow-up with him regarding the shoulder as needed.  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  Needs C-spine MRI to evaluate right-sided radiculopathy following neck fusion.  Not much in the way of right arm weakness but he does have classic radicular pain symptoms which have been going on for over 6 weeks.  We  will also get him a sling that he can use for his shoulder.  I also think that he has some swelling and warmth in the right wrist consistent with gout.  He has taken medication in the past for gout and has some of that left.  I think would be a good idea to do that to try to help decrease some of the swelling associated with the gout attack in his wrist.  He will follow-up with me after MRI scan.  Follow-Up Instructions: Return for after MRI.   Orders:  Orders Placed This Encounter  Procedures   XR Cervical Spine 2 or 3 views   MR Cervical Spine w/o contrast   No orders of the defined types were placed in this encounter.     Procedures: No procedures performed   Clinical Data: No additional findings.  Objective: Vital Signs: There were no vitals taken for this visit.  Physical Exam:   Constitutional: Patient appears well-developed HEENT:  Head: Normocephalic Eyes:EOM are normal Neck: Normal range of motion Cardiovascular: Normal rate Pulmonary/chest: Effort normal Neurologic: Patient is alert Skin: Skin is warm Psychiatric: Patient has normal mood and affect    Ortho Exam: Ortho exam demonstrates some swelling and warmth in the right wrist with loss of about  20% of flexion and 20% of extension compared to the left wrist.  Radial pulses palpable.  He is got diminished grip strength on the right compared with the left but no proximal lymphadenopathy on the right-hand side.  Does have painful coarseness with passive range of motion of that right shoulder consistent with his known history of arthritis.  He also has paresthesias on the right-hand side in the C5 distribution compared to the left.  Neck range of motion is fairly reasonable with flexion extension lacking only about 10 degrees of full as well as rotation which is about 40 degrees bilaterally.  Specialty Comments:  No specialty comments available.  Imaging: Mr Cervical Spine W/o Contrast  Result Date:  06/05/2019 CLINICAL DATA:  Right radicular arm pain over the last 2-3 weeks with numbness in the right arm. Prior cervical spine surgery in 2005. EXAM: MRI CERVICAL SPINE WITHOUT CONTRAST TECHNIQUE: Multiplanar, multisequence MR imaging of the cervical spine was performed. No intravenous contrast was administered. COMPARISON:  Radiographs from 06/05/2019 FINDINGS: Alignment: 1.5 mm degenerative anterolisthesis C7-T1. Vertebrae: Anterior plate and screw fixator at C5-C6-C7 with solid interbody bony fusion. Type 2 degenerative endplate findings at the C4-5 level with associated disc desiccation and mild loss of disc height. Cord: No significant abnormal spinal cord signal is observed. Posterior Fossa, vertebral arteries, paraspinal tissues: Chronic left maxillary sinusitis. Disc levels: C2-3: Borderline left foraminal stenosis due to facet arthropathy. C3-4: Moderate bilateral foraminal stenosis and mild central narrowing of the thecal sac due to disc bulge, uncinate spurring, and facet arthropathy. C4-5: Moderate to prominent right and mild left foraminal stenosis with moderate central narrowing of the thecal sac due to disc bulge, uncinate spurring, and facet arthropathy. C5-6: No impingement. Borderline right foraminal stenosis due to chronic spurring. C6-7: No impingement. Fused level. C7-T1: No impingement. Degenerative facet arthropathy. IMPRESSION: 1. Cervical spondylosis and degenerative disc disease, causing moderate to prominent impingement at C4-5 and moderate impingement at C3-4. 2. Anterior plate and screw fixator at C5-C6-C7 with solid interbody bony fusion. 3. Chronic left maxillary sinusitis. Electronically Signed   By: Van Clines M.D.   On: 06/05/2019 18:39   Xr Cervical Spine 2 Or 3 Views  Result Date: 06/05/2019 AP lateral cervical spine reviewed.  Cervical spine fusion at C5-6 and C6-7 has been performed.  No complicating features.  No adjacent segment disease.    PMFS  History: Patient Active Problem List   Diagnosis Date Noted   Acute CVA (cerebrovascular accident) ([place]) 08/19/2018   Acute ischemic stroke ([place]) 08/18/2018   Stroke ([place]) 08/18/2018   Sore throat 06/25/2018   Well adult exam 03/27/2018   Hyperlipidemia 03/27/2018   Basal cell carcinoma (BCC) of ala nasi 03/11/2017   Prediabetes 03/11/2017   DDD (degenerative disc disease), lumbosacral 01/13/2014   NONSPECIFIC ABNORMAL ELECTROCARDIOGRAM 06/21/2008   PROSTATE CANCER, HX OF 06/21/2008   COLONIC POLYPS, HX OF 06/21/2008   DIVERTICULOSIS, COLON 05/07/2008   NEPHROLITHIASIS, HX OF 05/07/2008   History of gout 10/09/2007   Past Medical History:  Diagnosis Date   Colon polyps    Diverticulosis    Gout    Hemorrhoids    Hyperlipemia    Nephrolithiasis    Prostate cancer ([place])    Stroke ([place])     Family History  Problem Relation Age of Onset   Kidney cancer Father    Colon cancer Father    Cancer Brother        ?   Heart attack Brother 48  Heart attack Sister 28   Breast cancer Sister    Diabetes Brother    Alcohol abuse Brother    Colon polyps Brother     Past Surgical History:  Procedure Laterality Date   CATARACT EXTRACTION W/ INTRAOCULAR LENS  IMPLANT, BILATERAL     bi-lat   CERVICAL SPINE SURGERY     COLONOSCOPY W/ POLYPECTOMY     Epidural steroid injections     3-4, Dr Ellene Route   RETROPUBIC PROSTATECTOMY     SHOULDER SURGERY     left   Social History   Occupational History   Occupation: retired  Tobacco Use   Smoking status: Never Smoker   Smokeless tobacco: Never Used  Substance and Sexual Activity   Alcohol use: Yes    Alcohol/week: 1.0 standard drinks    Types: 1 Cans of beer per week   Drug use: No   Sexual activity: Not on file

## 2019-06-05 NOTE — Telephone Encounter (Signed)
Please cancel appointment for gel injection. Patient is being referred for neck scan.

## 2019-06-08 ENCOUNTER — Encounter: Payer: Self-pay | Admitting: Orthopedic Surgery

## 2019-06-08 ENCOUNTER — Telehealth: Payer: Self-pay | Admitting: *Deleted

## 2019-06-08 ENCOUNTER — Other Ambulatory Visit: Payer: Self-pay

## 2019-06-08 DIAGNOSIS — M79601 Pain in right arm: Secondary | ICD-10-CM

## 2019-06-08 NOTE — Telephone Encounter (Signed)
Ok for colchicine 1 po bid prn gout pain for 5 days max # 45 pls call in thx

## 2019-06-08 NOTE — Telephone Encounter (Signed)
From a stroke standpoint, he is cleared to hold Plavix for 7 days prior to procedure as he has been stable from a stroke standpoint with small but acceptable preprocedure risk of recurrent stroke off therapy.  Recommend restarting immediately after procedure once hemodynamically stable

## 2019-06-09 ENCOUNTER — Encounter: Payer: Self-pay | Admitting: Orthopedic Surgery

## 2019-06-09 ENCOUNTER — Ambulatory Visit: Payer: Self-pay

## 2019-06-09 MED ORDER — COLCHICINE 0.6 MG PO TABS: ORAL_TABLET | ORAL | 0 refills | Status: DC

## 2019-06-09 NOTE — Telephone Encounter (Signed)
Pt is scheduled for 07/06/2019, advised per Frann Rider, NP for pt to hold BT 7 days prior to injection, on 06/29/2019. 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

## 2019-06-09 NOTE — Telephone Encounter (Signed)
Patient's wife states that Dr Quay Burow agreed to see him to re-establish care. They preferred to see her for this issue, FYI.

## 2019-06-09 NOTE — Telephone Encounter (Signed)
Called patient and triaged for office. Spoke with patient wife. Patient has had previous stroke which makes it difficult for him to understand phone conversations. She states that in September her husband was out cutting limbs and heard something pop in his right shoulder. He has seen orthopedist and had at least 2 injections pain for a tear to his right shoulder.  She states that the injections have not worked at control  of pain.  He has since developed neck pain rt side mainly. The orthopedist has done scans of his neck to cheke placement of hardware in his neck.  Per wife there is no problem.  He has had an abscess tooth and developed an infection on his right side jaw. He has taken antibiotic and has finished with his dental care. He now has right shoulder and neck pain rate at 10. He has swelling to his right wrist and top of right hand. He has in the past taken medication for gout.  He has just had his first dose of Colchicine given by orthopedist. He states that his pain is 90% gone in his shoulder.  Per protocol patient was asked to go to UC or ER for evaluation of symptoms. Per wife she states she he may not go. She was urged to take him for evaluation of his acute symptoms.  She agrees to try to get him to agree. Care advice was read to wife.  She verbalized understanding. Patient will also keep appointment Thursday with Dr Quay Burow.  Wife states she would like to have some blood work done on her husband.  Reason for Disposition . [1] SEVERE pain AND [2] not improved 2 hours after pain medicine/ice packs  Answer Assessment - Initial Assessment Questions 1. MECHANISM: "How did the injury happen?"     Cutting limbs in september 2. ONSET: "When did the injury happen?" (Minutes or hours ago)     Cutting limbs 3. APPEARANCE of INJURY: "What does the injury look like?"     Swelling in wrist and top of hand rt 4. SEVERITY: "Can you move the shoulder normally?"      Move but painful 5. SIZE: For cuts,  bruises, or swelling, ask: "How large is it?" (e.g., inches or centimeters;  entire joint)      N/A 6. PAIN: "Is there pain?" If so, ask: "How bad is the pain?"   (e.g., Scale 1-10; or mild, moderate, severe)     10 7. TETANUS: For any breaks in the skin, ask: "When was the last tetanus booster?"   N/A 8. OTHER SYMPTOMS: "Do you have any other symptoms?" (e.g., loss of sensation)    Neck pain both sides most on rt 9. PREGNANCY: "Is there any chance you are pregnant?" "When was your last menstrual period?"     N/A  Protocols used: SHOULDER INJURY-A-AH

## 2019-06-10 NOTE — Progress Notes (Signed)
Subjective:    Patient ID: Chad Norris, male    DOB: 1932/05/18, 85 y.o.   MRN: [id number]  HPI The patient is here for an acute visit for right arm pain.  He is here with his wife [name].     12/1 he has been scheduled for cochlerar impalnts.  Over one month ago he started having right arm pain.  He has been following with ortho and has had a couple of injections in his shoulder which have only lasted one week.  He last saw Dr Marlou Sa on 11/13 for his right arm pain.  He has known right shoulder pain from arthritis and has not had improvement from a cortisone injection.  Ortho felt he was having some right sided cervical radiculopathy that was causing his arm pain.  He has had neck fusion surgery in the past.  An MRI was ordered and he is scheduled for an epidural on 12/14.  It was thought his right wrist pain was swelling was gout.  He took indomethacin in the past, but ortho prescribed colchicine since he is on plavix.  He does not feel it is working as well.  He has swelling and pain in the wrist, but denies any redness.     He has had some neck pain.  Tylenol helps wtht he pain.   Dental work in [employer] before pain started.  Infection in tooth.  Given antibiotic.  Had ot have a cap.        MRI C-spine 05-26-19:   IMPRESSION: 1. Cervical spondylosis and degenerative disc disease, causing moderate to prominent impingement at C4-5 and moderate impingement at C3-4. 2. Anterior plate and screw fixator at C5-C6-C7 with solid interbody bony fusion. 3. Chronic left maxillary sinusitis.   Medications and allergies reviewed with patient and updated if appropriate.  Patient Active Problem List   Diagnosis Date Noted  . Swelling of right wrist 06/11/2019  . Cervical radiculopathy 06/11/2019  . Sensorineural hearing loss, bilateral 05/21/2019  . Acute CVA (cerebrovascular accident) ([place]) 08/19/2018  . Stroke ([place]) 08/18/2018  . Sore throat 06/25/2018  . Well adult exam 03/27/2018  .  Hyperlipidemia 03/27/2018  . Basal cell carcinoma (BCC) of [place] 03/11/2017  . Prediabetes 03/11/2017  . DDD (degenerative disc disease), lumbosacral 01/13/2014  . NONSPECIFIC ABNORMAL ELECTROCARDIOGRAM 06/21/2008  . PROSTATE CANCER, HX OF 06/21/2008  . COLONIC POLYPS, HX OF 06/21/2008  . DIVERTICULOSIS, COLON 05/07/2008  . NEPHROLITHIASIS, HX OF 05/07/2008  . History of gout 10/09/2007    Current Outpatient Medications on File Prior to Visit  Medication Sig Dispense Refill  . acetaminophen (TYLENOL) 500 MG tablet Take 1,000 mg by mouth every 6 (six) hours as needed (pain).    [name] atorvastatin (LIPITOR) 40 MG tablet TAKE 1 TABLET(40 MG) BY MOUTH DAILY AT 6 PM 90 tablet 1  . clopidogrel (PLAVIX) 75 MG tablet Take 1 tablet (75 mg total) by mouth daily. 90 tablet 3  . colchicine 0.6 MG tablet 1 po bid x 5 days 45 tablet 0  . Multiple Vitamins-Minerals (ADULT ONE DAILY GUMMIES) CHEW Chew 2 tablets by mouth daily. men     No current facility-administered medications on file prior to visit.     Past Medical History:  Diagnosis Date  . Colon polyps   . Diverticulosis   . Gout   . Hemorrhoids   . Hyperlipemia   . Nephrolithiasis   . Prostate cancer ([place])   . Stroke [facility])     Past  Surgical History:  Procedure Laterality Date  . CATARACT EXTRACTION W/ INTRAOCULAR LENS  IMPLANT, BILATERAL     bi-lat  . CERVICAL SPINE SURGERY    . COLONOSCOPY W/ POLYPECTOMY    . Epidural steroid injections     3-4, Dr Ellene Route  . RETROPUBIC PROSTATECTOMY    . SHOULDER SURGERY     left    Social History   Socioeconomic History  . Marital status: Married    Spouse name: Not on file  . Number of children: 2  . Years of education: Not on file  . Highest education level: Not on file  Occupational History  . Occupation: retired  Scientific laboratory technician  . Financial resource strain: Not on file  . Food insecurity    Worry: Not on file    Inability: Not on file  . Transportation needs    Medical: Not  on file    Non-medical: Not on file  Tobacco Use  . Smoking status: Never Smoker  . Smokeless tobacco: Never Used  Substance and Sexual Activity  . Alcohol use: Yes    Alcohol/week: 1.0 standard drinks    Types: 1 Cans of beer per week  . Drug use: No  . Sexual activity: Not on file  Lifestyle  . Physical activity    Days per week: Not on file    Minutes per session: Not on file  . Stress: Not on file  Relationships  . Social Herbalist on phone: Not on file    Gets together: Not on file    Attends religious service: Not on file    Active member of club or organization: Not on file    Attends meetings of clubs or organizations: Not on file    Relationship status: Not on file  Other Topics Concern  . Not on file  Social History Narrative  . Not on file    Family History  Problem Relation Age of Onset  . Kidney cancer Father   . Colon cancer Father   . Cancer Brother        ?  [name] Heart attack Brother 59  . Heart attack Sister 67  . Breast cancer Sister   . Diabetes Brother   . Alcohol abuse Brother   . Colon polyps Brother     Review of Systems  Constitutional: Negative for chills and fever.  Musculoskeletal: Positive for joint swelling (right wrist) and neck pain (right arm pain).       Right wrist pain and swelling  Skin: Negative for color change and rash.  Neurological: Positive for numbness. Negative for dizziness, weakness, light-headedness and headaches.       Objective:   Vitals:   06/11/19 1103  BP: 118/64  Pulse: 81  Temp: 97.9 F (36.6 C)  SpO2: 98%   BP Readings from Last 3 Encounters:  06/11/19 118/64  04/09/19 123/72  10/02/18 118/68   Wt Readings from Last 3 Encounters:  06/11/19 199 lb (90.3 kg)  04/09/19 206 lb 6.4 oz (93.6 kg)  10/02/18 212 lb (96.2 kg)   Body mass index is 26.99 kg/m.   Physical Exam Constitutional:      General: He is not in acute distress.    Appearance: Normal appearance. He is not  ill-appearing.  HENT:     Head: Normocephalic and atraumatic.  Musculoskeletal:        General: Swelling (mild in right wrist) and tenderness (mild in right wrist.  pain does not  increase with active and passive movement of the wrist) present. No deformity.     Comments: No posterior neck pain or cervical spine tenderness with pain or movement  Skin:    General: Skin is warm and dry.     Findings: No erythema or rash.  Neurological:     General: No focal deficit present.     Mental Status: He is alert.     Sensory: No sensory deficit.     Motor: No weakness.            Assessment & Plan:   This visit occurred during the SARS-CoV-2 public health emergency.  Safety protocols were in place, including screening questions prior to the visit, additional usage of staff PPE, and extensive cleaning of exam room while observing appropriate contact time as indicated for disinfecting solutions.     See Problem List for Assessment and Plan of chronic medical problems.

## 2019-06-10 NOTE — Telephone Encounter (Signed)
Seeing you tomorrow at 10:45.

## 2019-06-11 ENCOUNTER — Encounter: Payer: Self-pay | Admitting: Internal Medicine

## 2019-06-11 ENCOUNTER — Ambulatory Visit (INDEPENDENT_AMBULATORY_CARE_PROVIDER_SITE_OTHER): Payer: Medicare Other | Admitting: Internal Medicine

## 2019-06-11 ENCOUNTER — Other Ambulatory Visit: Payer: Self-pay

## 2019-06-11 ENCOUNTER — Other Ambulatory Visit (INDEPENDENT_AMBULATORY_CARE_PROVIDER_SITE_OTHER): Payer: Medicare Other

## 2019-06-11 VITALS — BP 118/64 | HR 81 | Temp 97.9°F | Ht 72.0 in | Wt 199.0 lb

## 2019-06-11 DIAGNOSIS — M25431 Effusion, right wrist: Secondary | ICD-10-CM | POA: Insufficient documentation

## 2019-06-11 DIAGNOSIS — Z23 Encounter for immunization: Secondary | ICD-10-CM

## 2019-06-11 DIAGNOSIS — M5412 Radiculopathy, cervical region: Secondary | ICD-10-CM

## 2019-06-11 LAB — COMPREHENSIVE METABOLIC PANEL
ALT: 15 U/L (ref 0–53)
AST: 17 U/L (ref 0–37)
Albumin: 4.6 g/dL (ref 3.5–5.2)
Alkaline Phosphatase: 57 U/L (ref 39–117)
BUN: 17 mg/dL (ref 6–23)
CO2: 23 mEq/L (ref 19–32)
Calcium: 9.5 mg/dL (ref 8.4–10.5)
Chloride: 103 mEq/L (ref 96–112)
Creatinine, Ser: 1.04 mg/dL (ref 0.40–1.50)
GFR: 67.8 mL/min (ref >60.00)
Glucose, Bld: 105 mg/dL — ABNORMAL HIGH (ref 70–99)
Potassium: 3.9 mEq/L (ref 3.5–5.1)
Sodium: 139 mEq/L (ref 135–145)
Total Bilirubin: 0.9 mg/dL (ref 0.2–1.2)
Total Protein: 8 g/dL (ref 6.0–8.3)

## 2019-06-11 LAB — CBC WITH DIFFERENTIAL/PLATELET
Basophils Absolute: 0.1 10*3/uL (ref 0.0–0.1)
Basophils Relative: 1.2 % (ref 0.0–3.0)
Eosinophils Absolute: 0.1 10*3/uL (ref 0.0–0.7)
Eosinophils Relative: 1.9 % (ref 0.0–5.0)
HCT: 43 % (ref 39.0–52.0)
Hemoglobin: 14.6 g/dL (ref 13.0–17.0)
Lymphocytes Relative: 32.3 % (ref 12.0–46.0)
Lymphs Abs: 2.4 10*3/uL (ref 0.7–4.0)
MCHC: 34 g/dL (ref 30.0–36.0)
MCV: 94 fl (ref 78.0–100.0)
Monocytes Absolute: 0.7 10*3/uL (ref 0.1–1.0)
Monocytes Relative: 10.1 % (ref 3.0–12.0)
Neutro Abs: 4 10*3/uL (ref 1.4–7.7)
Neutrophils Relative %: 54.5 % (ref 43.0–77.0)
Platelets: 221 10*3/uL (ref 150.0–400.0)
RBC: 4.58 Mil/uL (ref 4.22–5.81)
RDW: 13.4 % (ref 11.5–15.5)
WBC: 7.3 10*3/uL (ref 4.0–10.5)

## 2019-06-11 LAB — C-REACTIVE PROTEIN: CRP: <1 mg/dL (ref 0.5–20.0)

## 2019-06-11 LAB — URIC ACID: Uric Acid, Serum: 7.7 mg/dL (ref 4.0–7.8)

## 2019-06-11 LAB — SEDIMENTATION RATE: Sed Rate: 54 mm/hr — ABNORMAL HIGH (ref 0–20)

## 2019-06-11 MED ORDER — GABAPENTIN 100 MG PO CAPS: 100.0000 mg | ORAL_CAPSULE | Freq: Every day | ORAL | 3 refills | Status: DC

## 2019-06-11 MED ORDER — PREDNISONE 20 MG PO TABS: 40.0000 mg | ORAL_TABLET | Freq: Every day | ORAL | 0 refills | Status: DC

## 2019-06-11 NOTE — Patient Instructions (Addendum)
Have blood work done today.  Stop the colchicine.  You can continue the tylenol if needed.    Take the prednisone as prescribed - this is a steroid which is an anti-inflammatory that will help the arm pain and wrist pain.    Try the gabapentin at bedtime.  This is for nerve pain for the arm pain.  This can cause drowsiness or dizziness.  If this happens please let me know.  We can increase this medication to a higher dose if you tolerate it and this may help your pain more.     Update me with your pain.

## 2019-06-11 NOTE — Assessment & Plan Note (Signed)
Possible gout Not responding well to colchicine Will check uric acid level, esr, crp, cbc, cmp Prednisone 40 mg qd x 5 day Call if no improvement

## 2019-06-11 NOTE — Assessment & Plan Note (Signed)
Following with Dr Marlou Sa To have an epidural 12/14 Given pain - agrees to prednisone orally - 40 mg daily x 5 days Trial of gabapentin 100 mg daily - discussed possible side effects -- will titrate if tolerated

## 2019-06-13 ENCOUNTER — Encounter: Payer: Self-pay | Admitting: Internal Medicine

## 2019-06-14 ENCOUNTER — Encounter: Payer: Self-pay | Admitting: Adult Health

## 2019-06-23 ENCOUNTER — Encounter: Payer: Self-pay | Admitting: Internal Medicine

## 2019-06-23 ENCOUNTER — Encounter

## 2019-06-23 ENCOUNTER — Ambulatory Visit: Payer: Medicare Other | Admitting: Physical Medicine and Rehabilitation

## 2019-07-06 ENCOUNTER — Ambulatory Visit (INDEPENDENT_AMBULATORY_CARE_PROVIDER_SITE_OTHER): Payer: Medicare Other | Admitting: Physical Medicine and Rehabilitation

## 2019-07-06 ENCOUNTER — Ambulatory Visit: Payer: Self-pay

## 2019-07-06 ENCOUNTER — Encounter: Payer: Self-pay | Admitting: Physical Medicine and Rehabilitation

## 2019-07-06 ENCOUNTER — Other Ambulatory Visit: Payer: Self-pay

## 2019-07-06 VITALS — BP 131/79 | HR 69

## 2019-07-06 DIAGNOSIS — M5412 Radiculopathy, cervical region: Secondary | ICD-10-CM | POA: Diagnosis not present

## 2019-07-06 MED ORDER — METHYLPREDNISOLONE ACETATE 80 MG/ML IJ SUSP
80.0000 mg | Freq: Once | INTRAMUSCULAR | Status: AC
  Administered 2019-07-06: 80 mg

## 2019-07-06 NOTE — Progress Notes (Signed)
 .  Numeric Pain Rating Scale and Functional Assessment Average Pain 8   In the last MONTH (on 0-10 scale) has pain interfered with the following?  1. General activity like being  able to carry out your everyday physical activities such as walking, climbing stairs, carrying groceries, or moving a chair?  Rating(5)   +Driver, +BT(plavix, stopped 06/29/2019), -Dye Allergies.

## 2019-07-06 NOTE — Procedures (Signed)
Cervical Epidural Steroid Injection - Interlaminar Approach with Fluoroscopic Guidance  Patient: Chad Norris      Date of Birth: [birth year] MRN: [id number] PCP: Binnie Rail, MD      Visit Date: 07/06/2019   Universal Protocol:    Date/Time: 12/14/202:12 PM  Consent Given By: the patient  Position: PRONE  Additional Comments: Vital signs were monitored before and after the procedure. Patient was prepped and draped in the usual sterile fashion. The correct patient, procedure, and site was verified.   Injection Procedure Details:  Procedure Site One Meds Administered:  Meds ordered this encounter  Medications  . methylPREDNISolone acetate (DEPO-MEDROL) injection 80 mg     Laterality: Right  Location/Site: C7-T1  Needle size: 20 G  Needle type: Touhy  Needle Placement: Paramedian epidural space  Findings:  -Comments: Excellent flow of contrast into the epidural space.  Procedure Details: Using a paramedian approach from the side mentioned above, the region overlying the inferior lamina was localized under fluoroscopic visualization and the soft tissues overlying this structure were infiltrated with 4 ml. of 1% Lidocaine without Epinephrine. A # 20 gauge, Tuohy needle was inserted into the epidural space using a paramedian approach.  The epidural space was localized using loss of resistance along with lateral and contralateral oblique bi-planar fluoroscopic views.  After negative aspirate for air, blood, and CSF, a 2 ml. volume of Isovue-250 was injected into the epidural space and the flow of contrast was observed. Radiographs were obtained for documentation purposes.   The injectate was administered into the level noted above.  Additional Comments:  The patient tolerated the procedure well Dressing: 2 x 2 sterile gauze and Band-Aid    Post-procedure details: Patient was observed during the procedure. Post-procedure instructions were reviewed.  Patient left the  clinic in stable condition.

## 2019-07-09 NOTE — Progress Notes (Signed)
Chad Norris - 85 y.o. male MRN [id number]  Date of birth: [birth year]  Office Visit Note: Visit Date: 07/06/2019 PCP: Binnie Rail, MD Referred by: Luetta Nutting, DO  Subjective: Chief Complaint  Patient presents with  . Neck - Pain  . Right Shoulder - Pain  . Right Arm - Pain  . Right Wrist - Pain   HPI:  Chad Norris is a 85 y.o. male who comes in today At the request of Dr. Anderson Malta for right C7-T1 interlaminar dural steroid injection for symptomatic findings suggestive of a right radicular arm pain.  Patient has failed conservative care otherwise including evaluation and management of the shoulder itself.  MRI of the cervical spine was performed and this is reviewed below.  Patient does have moderate stenosis at C4-5  ROS Otherwise per HPI.  Assessment & Plan: Visit Diagnoses:  1. Cervical radiculopathy     Plan: No additional findings.   Meds & Orders:  Meds ordered this encounter  Medications  . methylPREDNISolone acetate (DEPO-MEDROL) injection 80 mg    Orders Placed This Encounter  Procedures  . XR C-ARM NO REPORT  . Epidural Steroid injection    Follow-up: No follow-ups on file.   Procedures: No procedures performed  Cervical Epidural Steroid Injection - Interlaminar Approach with Fluoroscopic Guidance  Patient: Chad Norris      Date of Birth: Apr 04, 1932 MRN: [id number] PCP: Binnie Rail, MD      Visit Date: 07/06/2019   Universal Protocol:    Date/Time: 12/14/202:12 PM  Consent Given By: the patient  Position: PRONE  Additional Comments: Vital signs were monitored before and after the procedure. Patient was prepped and draped in the usual sterile fashion. The correct patient, procedure, and site was verified.   Injection Procedure Details:  Procedure Site One Meds Administered:  Meds ordered this encounter  Medications  . methylPREDNISolone acetate (DEPO-MEDROL) injection 80 mg     Laterality: Right  Location/Site:  C7-T1  Needle size: 20 G  Needle type: Touhy  Needle Placement: Paramedian epidural space  Findings:  -Comments: Excellent flow of contrast into the epidural space.  Procedure Details: Using a paramedian approach from the side mentioned above, the region overlying the inferior lamina was localized under fluoroscopic visualization and the soft tissues overlying this structure were infiltrated with 4 ml. of 1% Lidocaine without Epinephrine. A # 20 gauge, Tuohy needle was inserted into the epidural space using a paramedian approach.  The epidural space was localized using loss of resistance along with lateral and contralateral oblique bi-planar fluoroscopic views.  After negative aspirate for air, blood, and CSF, a 2 ml. volume of Isovue-250 was injected into the epidural space and the flow of contrast was observed. Radiographs were obtained for documentation purposes.   The injectate was administered into the level noted above.  Additional Comments:  The patient tolerated the procedure well Dressing: 2 x 2 sterile gauze and Band-Aid    Post-procedure details: Patient was observed during the procedure. Post-procedure instructions were reviewed.  Patient left the clinic in stable condition.     Clinical History: MRI CERVICAL SPINE WITHOUT CONTRAST  TECHNIQUE: Multiplanar, multisequence MR imaging of the cervical spine was performed. No intravenous contrast was administered.  COMPARISON:  Radiographs from 06/05/2019  FINDINGS: Alignment: 1.5 mm degenerative anterolisthesis C7-T1.  Vertebrae: Anterior plate and screw fixator at C5-C6-C7 with solid interbody bony fusion.  Type 2 degenerative endplate findings at the C4-5 level with associated disc desiccation and mild loss  of disc height.  Cord: No significant abnormal spinal cord signal is observed.  Posterior Fossa, vertebral arteries, paraspinal tissues: Chronic left maxillary sinusitis.  Disc  levels:  C2-3: Borderline left foraminal stenosis due to facet arthropathy.  C3-4: Moderate bilateral foraminal stenosis and mild central narrowing of the thecal sac due to disc bulge, uncinate spurring, and facet arthropathy.  C4-5: Moderate to prominent right and mild left foraminal stenosis with moderate central narrowing of the thecal sac due to disc bulge, uncinate spurring, and facet arthropathy.  C5-6: No impingement. Borderline right foraminal stenosis due to chronic spurring.  C6-7: No impingement. Fused level.  C7-T1: No impingement. Degenerative facet arthropathy.  IMPRESSION: 1. Cervical spondylosis and degenerative disc disease, causing moderate to prominent impingement at C4-5 and moderate impingement at C3-4. 2. Anterior plate and screw fixator at C5-C6-C7 with solid interbody bony fusion. 3. Chronic left maxillary sinusitis.   Electronically Signed   By: Van Clines M.D.   On: 06/05/2019 18:39     Objective:  VS:  HT:    WT:   BMI:     BP:131/79  HR:69bpm  TEMP: ( )  RESP:  Physical Exam  Ortho Exam Imaging: No results found.

## 2019-07-22 ENCOUNTER — Encounter: Payer: Self-pay | Admitting: Internal Medicine

## 2019-08-01 ENCOUNTER — Ambulatory Visit: Payer: Medicare Other | Attending: Internal Medicine

## 2019-08-01 DIAGNOSIS — Z23 Encounter for immunization: Secondary | ICD-10-CM

## 2019-08-01 NOTE — Progress Notes (Signed)
   Covid-19 Vaccination Clinic  Name:  Leonidus Carleton    MRN: [id number] DOB: 16-Dec-1931  08/01/2019  Mr. Fidel was observed post Covid-19 immunization for 15 minutes without incidence. He was provided with Vaccine Information Sheet and instruction to access the V-Safe system.   Mr. Hauptmann was instructed to call 911 with any severe reactions post vaccine: [name] Difficulty breathing  . Swelling of your face and throat  . A fast heartbeat  . A bad rash all over your body  . Dizziness and weakness    Immunizations Administered    Name Date Dose VIS Date Route   Pfizer COVID-19 Vaccine 08/01/2019 12:01 PM 0.3 mL 07/03/2019 Intramuscular   Manufacturer: [employer], [employer]   Lot: [id number]   [place]: [id number]

## 2019-08-10 ENCOUNTER — Encounter: Payer: Self-pay | Admitting: Adult Health

## 2019-08-10 ENCOUNTER — Ambulatory Visit: Payer: Medicare Other | Admitting: Adult Health

## 2019-08-10 ENCOUNTER — Other Ambulatory Visit: Payer: Self-pay

## 2019-08-10 VITALS — BP 122/72 | HR 66 | Temp 97.3°F | Ht 72.0 in | Wt 197.0 lb

## 2019-08-10 DIAGNOSIS — I63512 Cerebral infarction due to unspecified occlusion or stenosis of left middle cerebral artery: Secondary | ICD-10-CM

## 2019-08-10 DIAGNOSIS — I10 Essential (primary) hypertension: Secondary | ICD-10-CM | POA: Diagnosis not present

## 2019-08-10 DIAGNOSIS — E785 Hyperlipidemia, unspecified: Secondary | ICD-10-CM | POA: Diagnosis not present

## 2019-08-10 NOTE — Patient Instructions (Signed)
Continue clopidogrel 75 mg daily  and lipitor  for secondary stroke prevention  Continue to follow up with PCP regarding choelsterol and blood pressure management   Continue to follow with orthopedics for ongoing left shoulder management and ongoing monitoring  Continue to monitor blood pressure at home  Maintain strict control of hypertension with blood pressure goal below 130/90, diabetes with hemoglobin A1c goal below 6.5% and cholesterol with LDL cholesterol (bad cholesterol) goal below 70 mg/dL. I also advised the patient to eat a healthy diet with plenty of whole grains, cereals, fruits and vegetables, exercise regularly and maintain ideal body weight.       Thank you for coming to see [place] at [facility]. I hope we have been able to provide you high quality care today.  You may receive a patient satisfaction survey over the next few weeks. We would appreciate your feedback and comments so that we may continue to improve ourselves and the health of our patients.

## 2019-08-10 NOTE — Progress Notes (Signed)
[name] [address] [place]. [place] [address] [phone]       OFFICE FOLLOW UP NOTE  [name] Date of Birth:  07-Mar-1932 Medical Record Number:  [id number]   Reason for visit: Left BG/CR stroke 07/2018  CHIEF COMPLAINT:  Chief Complaint  Patient presents with  . Follow-up    RM9. Alone. States he stays weak all the time. Since the stroke Doesnt feel strong anymore.     HPI:  Stroke admission 08/18/2018: [name] is a 86 y.o. male with history of HLD  who presented with slurred speech, generalized weakness and blurred vision.  CT head reviewed and was negative for hemorrhage but did show old left lenticular nucleus infarct versus chronic microvascular changes and atrophy.  MRI brain reviewed showed acute left lentiform nucleus infarct.  MRA head showed occluded left A1, both ACAs fill from the right and decreased flow left ICA.  MRA neck normal.  2D echo showed an EF of 50 to 55%.  LDL 135 and initiated atorvastatin 40 mg daily.  A1c satisfactory at 5.8.  No antithrombotic PTA and recommended aspirin and Plavix for 3 weeks and aspirin alone.  Other stroke risk factors include advanced age, EtOH use and potential OSA.  He was discharged home in stable condition without therapy needs.  Initial visit 10/01/2018: He is being seen today for hospital follow-up and unfortunately return to ED on 09/26/2018 with symptoms of right-sided numbness and imaging did not show acute 9 mm infarct at margin of chronic infarct.  He has been doing well since this time without any residual or recurring symptoms.  He was restarted on 3 weeks DAPT which he continues at this time without side effects of bleeding or bruising.  Continues on atorvastatin without side effects myalgias.  Blood pressure today satisfactory 118/68.  He remains very active within the community where he is on the board of homeowners association along with frequently playing golf.  He also has drastically  changed his diet with assistance of his wife.  He was evaluated by [name] yesterday, 10/01/2018, for potential OSA evaluation but at this time patient declines undergoing sleep study.  No further concerns at this time.  Denies new or worsening stroke/TIA symptoms.  Update 04/09/2019: [name] is being seen today for follow-up regarding prior stroke accompanied by his wife.  He has been doing well since prior visit without residual deficits or recurring symptoms.  Continues on Plavix and atorvastatin for secondary stroke prevention without side effects.  Blood pressure today satisfactory at 123/72.  He is currently being evaluated by [facility] audiology in regards to potentially undergoing cochlear implant device procedure.  He is questioning safety of pursuing procedure from a neurological standpoint.  No further concerns at this time.   Update 08/10/2019: Chad Norris is a 86 year old male who is being seen today for stroke follow-up.  He has been doing well from a stroke standpoint without new or recurring stroke/TIA symptoms.  Previously on Plavix but recently self discontinued due to incident where he cut himself shaving and reports increased bleeding.  He denies blood in his urine, stool or from his nose or any excessive bleeding. Continues on atorvastatin for secondary stroke prevention without side effects.  Blood pressure today satisfactory at 122/72. At prior visit, discussion regarding cochlear implant but due to undergoing anesthesia for 3-4 hours, wife does not feel comfortable with him pursing despite being cleared by other providers to undergo anesthesia. He does complain of  increased fatigue during the day which has worsened over 3-4 months as he has not been sleeping well due to right shoulder pain. He continues to follow with orthopedics for ongoing shoulder pain with injections.  Previously evaluated by [name] for evaluation of possible sleep apnea but he declined further evaluation  with sleep study.  No further concerns at this time.     ROS:   14 system review of systems performed and negative with exception of hearing loss, pain and fatigue.  PMH:  Past Medical History:  Diagnosis Date  . Colon polyps   . Diverticulosis   . Gout   . Hemorrhoids   . Hyperlipemia   . Nephrolithiasis   . Prostate cancer ([place])   . Stroke [facility])     PSH:  Past Surgical History:  Procedure Laterality Date  . CATARACT EXTRACTION W/ INTRAOCULAR LENS  IMPLANT, BILATERAL     bi-lat  . CERVICAL SPINE SURGERY    . COLONOSCOPY W/ POLYPECTOMY    . Epidural steroid injections     3-4, Dr Ellene Route  . RETROPUBIC PROSTATECTOMY    . SHOULDER SURGERY     left    Social History:  Social History   Socioeconomic History  . Marital status: Married    Spouse name: Not on file  . Number of children: 2  . Years of education: Not on file  . Highest education level: Not on file  Occupational History  . Occupation: retired  Tobacco Use  . Smoking status: Never Smoker  . Smokeless tobacco: Never Used  Substance and Sexual Activity  . Alcohol use: Yes    Alcohol/week: 1.0 standard drinks    Types: 1 Cans of beer per week  . Drug use: No  . Sexual activity: Not on file  Other Topics Concern  . Not on file  Social History Narrative  . Not on file   Social Determinants of Health   Financial Resource Strain:   . Difficulty of Paying Living Expenses: Not on file  Food Insecurity:   . Worried About Charity fundraiser in the Last Year: Not on file  . Ran Out of Food in the Last Year: Not on file  Transportation Needs:   . Lack of Transportation (Medical): Not on file  . Lack of Transportation (Non-Medical): Not on file  Physical Activity:   . Days of Exercise per Week: Not on file  . Minutes of Exercise per Session: Not on file  Stress:   . Feeling of Stress : Not on file  Social Connections:   . Frequency of Communication with Friends and Family: Not on file  . Frequency  of Social Gatherings with Friends and Family: Not on file  . Attends Religious Services: Not on file  . Active Member of Clubs or Organizations: Not on file  . Attends Archivist Meetings: Not on file  . Marital Status: Not on file  Intimate Partner Violence:   . Fear of Current or Ex-Partner: Not on file  . Emotionally Abused: Not on file  . Physically Abused: Not on file  . Sexually Abused: Not on file    Family History:  Family History  Problem Relation Age of Onset  . Kidney cancer Father   . Colon cancer Father   . Cancer Brother        ?  [name] Heart attack Brother 21  . Heart attack Sister 76  . Breast cancer Sister   . Diabetes Brother   .  Alcohol abuse Brother   . Colon polyps Brother     Medications:   Current Outpatient Medications on File Prior to Visit  Medication Sig Dispense Refill  . acetaminophen (TYLENOL) 500 MG tablet Take 1,000 mg by mouth every 6 (six) hours as needed (pain).    [name] atorvastatin (LIPITOR) 40 MG tablet TAKE 1 TABLET(40 MG) BY MOUTH DAILY AT 6 PM 90 tablet 1  . colchicine 0.6 MG tablet 1 po bid x 5 days 45 tablet 0  . Multiple Vitamins-Minerals (ADULT ONE DAILY GUMMIES) CHEW Chew 2 tablets by mouth daily. men    . clopidogrel (PLAVIX) 75 MG tablet Take 1 tablet (75 mg total) by mouth daily. (Patient not taking: Reported on 08/10/2019) 90 tablet 3  . gabapentin (NEURONTIN) 100 MG capsule Take 1 capsule (100 mg total) by mouth at bedtime. 30 capsule 3  . predniSONE (DELTASONE) 20 MG tablet Take 2 tablets (40 mg total) by mouth daily with breakfast. 10 tablet 0   No current facility-administered medications on file prior to visit.    Allergies:  No Known Allergies   Physical Exam  Vitals:   08/10/19 1533  BP: 122/72  Pulse: 66  Temp: (!) 97.3 F (36.3 C)  Weight: 197 lb (89.4 kg)  Height: 6' (1.829 m)   Body mass index is 26.72 kg/m. No exam data present  General: well developed, well nourished, very pleasant elderly  Caucasian male, seated, in no evident distress Head: head normocephalic and atraumatic.   Neck: supple with no carotid or supraclavicular bruits Cardiovascular: regular rate and rhythm, no murmurs Musculoskeletal: no deformity; limited ROM right shoulder due to pain Skin:  no rash/petichiae Vascular:  Normal pulses all extremities  Neurologic Exam Mental Status: Awake and fully alert. Oriented to place and time. Recent and remote memory intact. Attention span, concentration and fund of knowledge appropriate. Mood and affect appropriate.  Cranial Nerves: Pupils equal, briskly reactive to light. Extraocular movements full without nystagmus. Visual fields full to confrontation.  HOH bilaterally. Facial sensation intact. Face, tongue, palate moves normally and symmetrically.  Motor: Normal bulk and tone. Normal strength in all tested extremity muscles. Sensory.: intact to touch , pinprick , position and vibratory sensation.  Coordination: Rapid alternating movements normal in all extremities. Finger-to-nose and heel-to-shin performed accurately bilaterally. Gait and Station: Arises from chair without difficulty. Stance is normal. Gait demonstrates normal stride length and balance. Able to heel, toe and tandem walk without difficulty.   Reflexes: 1+ and symmetric. Toes downgoing.      Diagnostic Data (Labs, Imaging, Testing)  CT HEAD WO CONTRAST 08/18/2018 IMPRESSION: 1. No intracranial hemorrhage. 2. Chronic microvascular changes versus small infarct of indeterminate age posterior superior left lenticular nucleus. 3. Mild global atrophy most notable parietal lobes.  MR BRAIN WO CONTRAST MR MRA HEAD  MR MRA NECK 08/18/2018 IMPRESSION: 1. Acute nonhemorrhagic infarct involving the posterior left lentiform nucleus measuring up to 1.5 cm. 2. MR brain otherwise normal for age. 3. Occluded proximal left A1 segment. 4. Both ACA vessels fill from the right with asymmetric flow to the left  ACA. 5. Normal MRA of the neck.  ECHOCARDIOGRAM 08/19/2018 IMPRESSIONS  1. The left ventricle appears to be normal in size, has normal wall thickness 50-55% ejection fraction Spectral Doppler shows indeterminate pattern of diastolic filling.  2. There is mild hypokinesis of the basiliar-mid anteroseptal left ventricular segment.  3. Septal hypokinesis is likely due to LBBB.  4. Right ventricular systolic pressure is is normal.  5. The right ventricle has normal size and normal systolic function.  6. Normal left atrial size.  7. Normal right atrial size.  8. Mitral valve regurgitation is trivial by color flow Doppler.  9. The mitral valve normal in structure and function. 10. Normal tricuspid valve. 11. Aortic valve normal. 12. No atrial level shunt detected by color flow Doppler.  MR BRAIN WO CONTRAST 09/26/2018 IMPRESSION: 1. New/recrudescent 9 mm acute/early subacute infarction at the superior margin of now chronic infarct in left lentiform nucleus. No hemorrhage or mass effect. 2. Stable mild chronic microvascular ischemic changes and volume loss of the brain.   ASSESSMENT: Chad Norris is a 86 y.o. 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 Vascular risk factors include HTN and HLD.  Recovered well from a stroke standpoint without residual deficits or reoccurring of symptoms.    PLAN:  1. Left lentiform nucleus infarct with expansion: Advised to restart clopidogrel 75 mg daily  and continue atorvastatin for secondary stroke prevention. Maintain strict control of hypertension with blood pressure goal below 130/90, diabetes with hemoglobin A1c goal below 6.5% and cholesterol with LDL cholesterol (bad cholesterol) goal below 70 mg/dL.  I also advised the patient to eat a healthy diet with plenty of whole grains, cereals, fruits and vegetables, exercise regularly with at least 30 minutes of  continuous activity daily and maintain ideal body weight. 2. HTN: Advised to continue current treatment regimen.  Advised to continue to monitor at home along with continued follow-up with PCP for management 3. HLD: Advised to continue current treatment regimen along with continued follow-up with PCP for future prescribing and monitoring of lipid panel.  Lipid panel obtained at today's visit per patient request as it has not been checked since prior visit to ensure adequate management with current statin 4. Right shoulder pain: Defer ongoing monitoring management to orthopedics 5. ?  Sleep apnea: Previously evaluated by [name] for questionable sleep apnea but patient declined further sleep study evaluation.  Complains of daytime fatigue and discussed this could be due to underlying untreated sleep apnea versus ongoing right shoulder pain.  He continues to decline further evaluation with sleep study and is aware of increased risks of untreated sleep apnea as this has been discussed at prior follow-up visits.  He is aware to call office in the future if he wishes to proceed with testing   Follow-up in 6 months or call earlier if needed   Greater than 50% of time during this 20 minute visit was spent on discussing undergoing cochlear implant, ongoing right shoulder pain, discussion of left lentiform nucleus infarct, reviewing risk factor management of HTN and HLD, planning of further management along with potential future management, and discussion with patient answered all questions to [place], AGNP-BC  [facility] [address] [place], [place] [address]  Phone [phone] Fax [phone] Note: This document was prepared with digital dictation and possible smart phrase technology. Any transcriptional errors that result from this process are unintentional.

## 2019-08-11 NOTE — Progress Notes (Signed)
I agree with the above plan 

## 2019-08-19 ENCOUNTER — Ambulatory Visit: Payer: Medicare Other

## 2019-08-22 ENCOUNTER — Ambulatory Visit: Payer: Medicare Other

## 2019-08-22 ENCOUNTER — Ambulatory Visit: Payer: Medicare Other | Attending: Internal Medicine

## 2019-08-22 DIAGNOSIS — Z23 Encounter for immunization: Secondary | ICD-10-CM | POA: Insufficient documentation

## 2019-08-22 NOTE — Progress Notes (Signed)
   Covid-19 Vaccination Clinic  Name:  Chad Norris    MRN: [id number] DOB: 12/24/1931  08/22/2019  Mr. [name] was observed post Covid-19 immunization for 15 minutes without incidence. He was provided with Vaccine Information Sheet and instruction to access the V-Safe system.   Mr. [name] was instructed to call 911 with any severe reactions post vaccine: [name] Difficulty breathing  . Swelling of your face and throat  . A fast heartbeat  . A bad rash all over your body  . Dizziness and weakness    Immunizations Administered    Name Date Dose VIS Date Route   Pfizer COVID-19 Vaccine 08/22/2019  2:27 PM 0.3 mL 07/03/2019 Intramuscular   Manufacturer: [place]   Lot: [id number]   [place]: [id number]

## 2019-09-06 ENCOUNTER — Encounter: Payer: Self-pay | Admitting: Adult Health

## 2019-09-22 ENCOUNTER — Other Ambulatory Visit: Payer: Self-pay | Admitting: Family Medicine

## 2019-09-22 NOTE — Telephone Encounter (Signed)
SB-Plz see refill req/thx dmf

## 2019-10-15 ENCOUNTER — Ambulatory Visit: Payer: Medicare Other | Admitting: Orthopedic Surgery

## 2019-10-15 ENCOUNTER — Encounter: Payer: Self-pay | Admitting: Orthopedic Surgery

## 2019-10-15 ENCOUNTER — Other Ambulatory Visit: Payer: Self-pay

## 2019-10-15 ENCOUNTER — Telehealth: Payer: Self-pay

## 2019-10-15 DIAGNOSIS — M12812 Other specific arthropathies, not elsewhere classified, left shoulder: Secondary | ICD-10-CM

## 2019-10-15 DIAGNOSIS — H905 Unspecified sensorineural hearing loss: Secondary | ICD-10-CM | POA: Diagnosis not present

## 2019-10-15 DIAGNOSIS — M12811 Other specific arthropathies, not elsewhere classified, right shoulder: Secondary | ICD-10-CM

## 2019-10-15 NOTE — Telephone Encounter (Signed)
Patient scheduled to see you Monday for bilateral GH injections per Dr Marlou Sa. Patient would like to know if he should d/c his blood thinner for this procedure. He stated that last time he had injection with you it was mentioned. Please advise. Thanks.

## 2019-10-15 NOTE — Telephone Encounter (Signed)
Generally no need to stop blood thinner for these injections.

## 2019-10-15 NOTE — Telephone Encounter (Signed)
IC advised.  

## 2019-10-16 ENCOUNTER — Encounter: Payer: Self-pay | Admitting: Orthopedic Surgery

## 2019-10-16 NOTE — Progress Notes (Signed)
Office Visit Note   Patient: Chad Norris           Date of Birth: 1931/10/14           MRN: [id number] Visit Date: 10/15/2019 Requested by: Luetta Nutting, [place] [place],  [place] [address] PCP: Binnie Rail, MD  Subjective: Chief Complaint  Patient presents with  . Left Shoulder - Pain  . Right Shoulder - Pain    HPI: Chad Norris is a patient with bilateral shoulder and arm pain.  Here for 70-month follow-up.  Usually the right 1 is worse but recently the left limb is become worse.  Does wake him from sleep at night.  Has to sleep in the chair at times.  Taking Tylenol.  Previous injection with Dr. Junius Roads gave him a lot of relief.  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  He had an MRI scan of his neck in November 2020 which showed moderate right-sided C4-5 impingement.  Had cervical spine ESI in December 2020.  Shoulder pain is been worsening in the past several weeks.  Has soreness in his arms after he plays golf.  The left one hurts worse than the right for the first time today.              ROS: All systems reviewed are negative as they relate to the chief complaint within the history of present illness.  Patient denies  fevers or chills.   Assessment & Plan: Visit Diagnoses:  1. Rotator cuff arthropathy of both shoulders     Plan: Impression is rotator cuff arthropathy bilateral shoulders and cervical spine nerve root impingement.  I Minna send him over to get bilateral glenohumeral joint injections.  Wants to hold off on any type of surgical intervention for now.  I think some of this could be coming from his neck as well.  But nonetheless for immediate pain relief particularly with the left side worsening recently injections are indicated.  Follow-up with me in the near future for further management if needed.  Follow-Up Instructions: No follow-ups on file.   Orders:  No orders of the defined types were  placed in this encounter.  No orders of the defined types were placed in this encounter.     Procedures: No procedures performed   Clinical Data: No additional findings.  Objective: Vital Signs: There were no vitals taken for this visit.  Physical Exam:   Constitutional: Patient appears well-developed HEENT:  Head: Normocephalic Eyes:EOM are normal Neck: Normal range of motion Cardiovascular: Normal rate Pulmonary/chest: Effort normal Neurologic: Patient is alert Skin: Skin is warm Psychiatric: Patient has normal mood and affect    Ortho Exam: Ortho exam demonstrates pretty reasonable cervical spine range of motion.  No muscle atrophy in the arms.  Radial pulse intact bilaterally.  Deltoid function is intact.  Does have some weakness to infra and supraspinatus testing bilaterally but it is not particularly severe.  Subscap strength is reasonable.  He is able to forward flexion abduction to around 90 degrees.  Specialty Comments:  No specialty comments available.  Imaging: No results found.   PMFS History: Patient Active Problem List   Diagnosis Date Noted  . Swelling of right wrist 06/11/2019  . Cervical radiculopathy 06/11/2019  . Sensorineural hearing loss, bilateral 05/21/2019  . Acute CVA (cerebrovascular accident) ([place]) 08/19/2018  . Stroke ([place]) 08/18/2018  . Sore throat 06/25/2018  . Well adult  exam 03/27/2018  . Hyperlipidemia 03/27/2018  . Basal cell carcinoma (BCC) of [place] 03/11/2017  . Prediabetes 03/11/2017  . DDD (degenerative disc disease), lumbosacral 01/13/2014  . NONSPECIFIC ABNORMAL ELECTROCARDIOGRAM 06/21/2008  . PROSTATE CANCER, HX OF 06/21/2008  . COLONIC POLYPS, HX OF 06/21/2008  . DIVERTICULOSIS, COLON 05/07/2008  . NEPHROLITHIASIS, HX OF 05/07/2008  . History of gout 10/09/2007   Past Medical History:  Diagnosis Date  . Colon polyps   . Diverticulosis   . Gout   . Hemorrhoids   . Hyperlipemia   . Nephrolithiasis   .  Prostate cancer ([place])   . Stroke [facility])     Family History  Problem Relation Age of Onset  . Kidney cancer Father   . Colon cancer Father   . Cancer Brother        ?  [name] Heart attack Brother 23  . Heart attack Sister 14  . Breast cancer Sister   . Diabetes Brother   . Alcohol abuse Brother   . Colon polyps Brother     Past Surgical History:  Procedure Laterality Date  . CATARACT EXTRACTION W/ INTRAOCULAR LENS  IMPLANT, BILATERAL     bi-lat  . CERVICAL SPINE SURGERY    . COLONOSCOPY W/ POLYPECTOMY    . Epidural steroid injections     3-4, Dr Ellene Route  . RETROPUBIC PROSTATECTOMY    . SHOULDER SURGERY     left   Social History   Occupational History  . Occupation: retired  Tobacco Use  . Smoking status: Never Smoker  . Smokeless tobacco: Never Used  Substance and Sexual Activity  . Alcohol use: Yes    Alcohol/week: 1.0 standard drinks    Types: 1 Cans of beer per week  . Drug use: No  . Sexual activity: Not on file

## 2019-10-19 ENCOUNTER — Encounter: Payer: Self-pay | Admitting: Family Medicine

## 2019-10-19 ENCOUNTER — Ambulatory Visit: Payer: Medicare Other | Admitting: Family Medicine

## 2019-10-19 ENCOUNTER — Ambulatory Visit: Payer: Self-pay

## 2019-10-19 ENCOUNTER — Other Ambulatory Visit: Payer: Self-pay

## 2019-10-19 DIAGNOSIS — M12811 Other specific arthropathies, not elsewhere classified, right shoulder: Secondary | ICD-10-CM

## 2019-10-19 DIAGNOSIS — M25511 Pain in right shoulder: Secondary | ICD-10-CM

## 2019-10-19 DIAGNOSIS — M12812 Other specific arthropathies, not elsewhere classified, left shoulder: Secondary | ICD-10-CM

## 2019-10-19 DIAGNOSIS — M25512 Pain in left shoulder: Secondary | ICD-10-CM | POA: Diagnosis not present

## 2019-10-19 NOTE — Progress Notes (Signed)
Subjective: He is here for bilateral glenohumeral injections.  I have seen him and his wife years ago.  He has had problems with his shoulders causing limited range of motion and he has not been able to resume playing golf since he had a stroke last year.  He had subacromial injections which did not help.  He had a cervical epidural injection in December which helped quite a bit.  Objective: He has bilateral decreased overhead reach and behind the back reach in his shoulders.  He is tender posteriorly in the subacromial space.  Procedure: Ultrasound-guided bilateral glenohumeral injection: After sterile prep with Betadine, injected 8 cc 1% lidocaine without epinephrine and 40 mg methylprednisolone using a 22-gauge spinal needle, passing the needle through approach into the glenohumeral joint.  Injectate seen filling both joint capsules.  He had very good immediate relief.  If this does not give long-lasting relief, he will contact me and I will refer him again to Dr. Ernestina Patches for epidural injections.

## 2019-11-25 DIAGNOSIS — L820 Inflamed seborrheic keratosis: Secondary | ICD-10-CM | POA: Diagnosis not present

## 2019-12-07 ENCOUNTER — Encounter: Payer: Self-pay | Admitting: Internal Medicine

## 2019-12-08 ENCOUNTER — Encounter: Payer: Self-pay | Admitting: Internal Medicine

## 2019-12-10 ENCOUNTER — Other Ambulatory Visit: Payer: Self-pay

## 2019-12-10 ENCOUNTER — Encounter: Payer: Self-pay | Admitting: Internal Medicine

## 2019-12-10 ENCOUNTER — Ambulatory Visit (INDEPENDENT_AMBULATORY_CARE_PROVIDER_SITE_OTHER)
Admission: RE | Admit: 2019-12-10 | Discharge: 2019-12-10 | Disposition: A | Discharge status: Home / Self Care | Payer: Medicare Other | Source: Ambulatory Visit | Attending: Internal Medicine | Admitting: Internal Medicine

## 2019-12-10 ENCOUNTER — Ambulatory Visit (INDEPENDENT_AMBULATORY_CARE_PROVIDER_SITE_OTHER): Payer: Medicare Other | Admitting: Internal Medicine

## 2019-12-10 DIAGNOSIS — R062 Wheezing: Secondary | ICD-10-CM | POA: Diagnosis not present

## 2019-12-10 DIAGNOSIS — R05 Cough: Secondary | ICD-10-CM | POA: Diagnosis not present

## 2019-12-10 DIAGNOSIS — J209 Acute bronchitis, unspecified: Secondary | ICD-10-CM | POA: Diagnosis not present

## 2019-12-10 MED ORDER — BENZONATATE 100 MG PO CAPS: 100.0000 mg | ORAL_CAPSULE | Freq: Three times a day (TID) | ORAL | 0 refills | Status: DC

## 2019-12-10 MED ORDER — DOXYCYCLINE HYCLATE 100 MG PO CAPS: 100.0000 mg | ORAL_CAPSULE | Freq: Two times a day (BID) | ORAL | 0 refills | Status: DC

## 2019-12-10 NOTE — Assessment & Plan Note (Signed)
Likely bacterial Associated wheeze on exam cxr to r/o PNA  Start doxycycline Tessalon perles Rest, fluid Call if no improvement

## 2019-12-10 NOTE — Progress Notes (Signed)
Subjective:    Patient ID: Chad Norris, male    DOB: 1932/04/08, 86 y.o.   MRN: [id number]  HPI The patient is here for an acute visit for weakness.   His symptoms started last wednessay.  He has congestion in his thorat and chest.  He coughs and spits up all night.  The mucus is yellow in color. He had a sore throat which has resolved.  He states PND, nasal congestion, dec appetite, chest tightness.   He has been taking coricidin .  He feels weak. The coricidin was making him weaker and he does feel better after not taking it for a couple of days.   The coricidin make his sleepy and weak.    Medications and allergies reviewed with patient and updated if appropriate.  Patient Active Problem List   Diagnosis Date Noted  . Swelling of right wrist 06/11/2019  . Cervical radiculopathy 06/11/2019  . Sensorineural hearing loss, bilateral 05/21/2019  . Acute CVA (cerebrovascular accident) ([place]) 08/19/2018  . Stroke ([place]) 08/18/2018  . Sore throat 06/25/2018  . Well adult exam 03/27/2018  . Hyperlipidemia 03/27/2018  . Basal cell carcinoma (BCC) of ala nasi 03/11/2017  . Prediabetes 03/11/2017  . DDD (degenerative disc disease), lumbosacral 01/13/2014  . NONSPECIFIC ABNORMAL ELECTROCARDIOGRAM 06/21/2008  . PROSTATE CANCER, HX OF 06/21/2008  . COLONIC POLYPS, HX OF 06/21/2008  . DIVERTICULOSIS, COLON 05/07/2008  . NEPHROLITHIASIS, HX OF 05/07/2008  . History of gout 10/09/2007    Current Outpatient Medications on File Prior to Visit  Medication Sig Dispense Refill  . acetaminophen (TYLENOL) 500 MG tablet Take 1,000 mg by mouth every 6 (six) hours as needed (pain).    [name] atorvastatin (LIPITOR) 40 MG tablet TAKE 1 TABLET(40 MG) BY MOUTH DAILY AT 6 PM 90 tablet 1  . clopidogrel (PLAVIX) 75 MG tablet Take 1 tablet (75 mg total) by mouth daily. 90 tablet 3  . colchicine 0.6 MG tablet 1 po bid x 5 days 45 tablet 0  . Multiple Vitamins-Minerals (ADULT ONE DAILY GUMMIES) CHEW Chew 2  tablets by mouth daily. men     No current facility-administered medications on file prior to visit.    Past Medical History:  Diagnosis Date  . Colon polyps   . Diverticulosis   . Gout   . Hemorrhoids   . Hyperlipemia   . Nephrolithiasis   . Prostate cancer ([place])   . Stroke [facility])     Past Surgical History:  Procedure Laterality Date  . CATARACT EXTRACTION W/ INTRAOCULAR LENS  IMPLANT, BILATERAL     bi-lat  . CERVICAL SPINE SURGERY    . COLONOSCOPY W/ POLYPECTOMY    . Epidural steroid injections     3-4, Dr Ellene Route  . RETROPUBIC PROSTATECTOMY    . SHOULDER SURGERY     left    Social History   Socioeconomic History  . Marital status: Married    Spouse name: Not on file  . Number of children: 2  . Years of education: Not on file  . Highest education level: Not on file  Occupational History  . Occupation: retired  Tobacco Use  . Smoking status: Never Smoker  . Smokeless tobacco: Never Used  Substance and Sexual Activity  . Alcohol use: Yes    Alcohol/week: 1.0 standard drinks    Types: 1 Cans of beer per week  . Drug use: No  . Sexual activity: Not on file  Other Topics Concern  . Not on file  Social History Narrative  . Not on file   Social Determinants of Health   Financial Resource Strain:   . Difficulty of Paying Living Expenses:   Food Insecurity:   . Worried About Charity fundraiser in the Last Year:   . Arboriculturist in the Last Year:   Transportation Needs:   . Film/video editor (Medical):   [name] Lack of Transportation (Non-Medical):   Physical Activity:   . Days of Exercise per Week:   . Minutes of Exercise per Session:   Stress:   . Feeling of Stress :   Social Connections:   . Frequency of Communication with Friends and Family:   . Frequency of Social Gatherings with Friends and Family:   . Attends Religious Services:   . Active Member of Clubs or Organizations:   . Attends Archivist Meetings:   [name] Marital Status:      Family History  Problem Relation Age of Onset  . Kidney cancer Father   . Colon cancer Father   . Cancer Brother        ?  [name] Heart attack Brother 7  . Heart attack Sister 44  . Breast cancer Sister   . Diabetes Brother   . Alcohol abuse Brother   . Colon polyps Brother     Review of Systems  Constitutional: Positive for appetite change and fatigue. Negative for chills and fever.  HENT: Positive for congestion, hearing loss, postnasal drip and sore throat (resolved ). Negative for ear pain, sinus pressure and sinus pain.   Respiratory: Positive for cough and chest tightness. Negative for shortness of breath and wheezing.   Gastrointestinal: Negative for abdominal pain and nausea.  Neurological: Negative for dizziness (first thing in the morning) and headaches.       Objective:   Vitals:   12/10/19 0858  BP: 126/64  Pulse: (!) 57  Resp: 16  Temp: 98.5 F (36.9 C)  SpO2: 95%   BP Readings from Last 3 Encounters:  12/10/19 126/64  08/10/19 122/72  07/06/19 131/79   Wt Readings from Last 3 Encounters:  12/10/19 196 lb (88.9 kg)  08/10/19 197 lb (89.4 kg)  06/11/19 199 lb (90.3 kg)   Body mass index is 26.58 kg/m.   Physical Exam    GENERAL APPEARANCE: Appears stated age, well appearing, NAD EYES: conjunctiva clear, no icterus HEENT: bilateral tympanic membranes and ear canals normal, oropharynx with mild erythema, no thyromegaly, trachea midline, no cervical or supraclavicular lymphadenopathy LUNGS:  unlabored breathing, good air entry bilaterally, b/l mild exp wheeze CARDIOVASCULAR: Normal S1,S2 without murmurs, no edema SKIN: Warm, dry      Assessment & Plan:    See Problem List for Assessment and Plan of chronic medical problems.    This visit occurred during the SARS-CoV-2 public health emergency.  Safety protocols were in place, including screening questions prior to the visit, additional usage of staff PPE, and extensive cleaning of exam room while  observing appropriate contact time as indicated for disinfecting solutions.

## 2019-12-10 NOTE — Patient Instructions (Signed)
Have a chest xray done at the [facility] building     Medications reviewed and updated.  Changes include :   Doxycycline twice daily for 10 days.   Cough pills at needed.   Your prescription(s) have been submitted to your pharmacy. Please take as directed and contact our office if you believe you are having problem(s) with the medication(s).    Please call if there is no improvement in your symptoms.

## 2019-12-10 NOTE — Assessment & Plan Note (Signed)
Acute bronchitis - Likely bacterial wheeze on exam cxr to r/o PNA  Start doxycycline Tessalon perles Rest, fluid Call if no improvement

## 2019-12-17 ENCOUNTER — Encounter: Payer: Self-pay | Admitting: Internal Medicine

## 2019-12-18 MED ORDER — PREDNISONE 10 MG PO TABS: ORAL_TABLET | ORAL | 0 refills | Status: DC

## 2019-12-18 NOTE — Telephone Encounter (Signed)
Would like steroids sent to [facility] on file. Will stop doxy and tessalon pearles.

## 2019-12-25 ENCOUNTER — Encounter: Payer: Self-pay | Admitting: Internal Medicine

## 2019-12-27 NOTE — Progress Notes (Signed)
Subjective:    Patient ID: Chad Norris, male    DOB: [birth year], 86 y.o.   MRN: [id number]  HPI The patient is here for an acute visit.   I saw him 5/20 for bronchitis and started doxycycline and tessalon perles.  He developed a rash about 3 days later - thought to be an allergic rash from one of the two medications.    I prescribed steroids, but it only helped the first couple of days, then the rash came back.    He has red bumps all over his body and they itch.  He has difficulty sleeping.   He applies benadryl cream and it helps.  Cortisone cream did not help.     Medications and allergies reviewed with patient and updated if appropriate.  Patient Active Problem List   Diagnosis Date Noted  . Allergic drug rash 12/28/2019  . Acute bronchitis 12/10/2019  . Wheezing 12/10/2019  . Swelling of right wrist 06/11/2019  . Cervical radiculopathy 06/11/2019  . Sensorineural hearing loss, bilateral 05/21/2019  . Acute CVA (cerebrovascular accident) ([place]) 08/19/2018  . Stroke ([place]) 08/18/2018  . Sore throat 06/25/2018  . Well adult exam 03/27/2018  . Hyperlipidemia 03/27/2018  . Basal cell carcinoma (BCC) of ala nasi 03/11/2017  . Prediabetes 03/11/2017  . DDD (degenerative disc disease), lumbosacral 01/13/2014  . NONSPECIFIC ABNORMAL ELECTROCARDIOGRAM 06/21/2008  . PROSTATE CANCER, HX OF 06/21/2008  . COLONIC POLYPS, HX OF 06/21/2008  . DIVERTICULOSIS, COLON 05/07/2008  . NEPHROLITHIASIS, HX OF 05/07/2008  . History of gout 10/09/2007    Current Outpatient Medications on File Prior to Visit  Medication Sig Dispense Refill  . acetaminophen (TYLENOL) 500 MG tablet Take 1,000 mg by mouth every 6 (six) hours as needed (pain).    [name] atorvastatin (LIPITOR) 40 MG tablet TAKE 1 TABLET(40 MG) BY MOUTH DAILY AT 6 PM 90 tablet 1  . clopidogrel (PLAVIX) 75 MG tablet Take 1 tablet (75 mg total) by mouth daily. 90 tablet 3  . colchicine 0.6 MG tablet 1 po bid x 5 days 45 tablet 0  .  Multiple Vitamins-Minerals (ADULT ONE DAILY GUMMIES) CHEW Chew 2 tablets by mouth daily. men     No current facility-administered medications on file prior to visit.    Past Medical History:  Diagnosis Date  . Colon polyps   . Diverticulosis   . Gout   . Hemorrhoids   . Hyperlipemia   . Nephrolithiasis   . Prostate cancer ([place])   . Stroke [facility])     Past Surgical History:  Procedure Laterality Date  . CATARACT EXTRACTION W/ INTRAOCULAR LENS  IMPLANT, BILATERAL     bi-lat  . CERVICAL SPINE SURGERY    . COLONOSCOPY W/ POLYPECTOMY    . Epidural steroid injections     3-4, Dr Ellene Route  . RETROPUBIC PROSTATECTOMY    . SHOULDER SURGERY     left    Social History   Socioeconomic History  . Marital status: Married    Spouse name: Not on file  . Number of children: 2  . Years of education: Not on file  . Highest education level: Not on file  Occupational History  . Occupation: retired  Tobacco Use  . Smoking status: Never Smoker  . Smokeless tobacco: Never Used  Substance and Sexual Activity  . Alcohol use: Yes    Alcohol/week: 1.0 standard drinks    Types: 1 Cans of beer per week  . Drug use: No  . Sexual  activity: Not on file  Other Topics Concern  . Not on file  Social History Narrative  . Not on file   Social Determinants of Health   Financial Resource Strain:   . Difficulty of Paying Living Expenses:   Food Insecurity:   . Worried About Charity fundraiser in the Last Year:   . Arboriculturist in the Last Year:   Transportation Needs:   . Film/video editor (Medical):   [name] Lack of Transportation (Non-Medical):   Physical Activity:   . Days of Exercise per Week:   . Minutes of Exercise per Session:   Stress:   . Feeling of Stress :   Social Connections:   . Frequency of Communication with Friends and Family:   . Frequency of Social Gatherings with Friends and Family:   . Attends Religious Services:   . Active Member of Clubs or Organizations:   .  Attends Archivist Meetings:   [name] Marital Status:     Family History  Problem Relation Age of Onset  . Kidney cancer Father   . Colon cancer Father   . Cancer Brother        ?  [name] Heart attack Brother 10  . Heart attack Sister 48  . Breast cancer Sister   . Diabetes Brother   . Alcohol abuse Brother   . Colon polyps Brother     Review of Systems  Constitutional: Negative for fever.  HENT: Negative for sore throat and trouble swallowing.   Respiratory: Negative for cough, shortness of breath and wheezing.   Skin: Positive for rash.  Psychiatric/Behavioral: Positive for sleep disturbance.       Objective:   Vitals:   12/28/19 1416  BP: 122/76  Pulse: 74  Temp: 98.5 F (36.9 C)  SpO2: 97%   BP Readings from Last 3 Encounters:  12/28/19 122/76  12/10/19 126/64  08/10/19 122/72   Wt Readings from Last 3 Encounters:  12/28/19 196 lb (88.9 kg)  12/10/19 196 lb (88.9 kg)  08/10/19 197 lb (89.4 kg)   Body mass index is 26.58 kg/m.   Physical Exam Constitutional:      General: He is not in acute distress.    Appearance: Normal appearance. He is not ill-appearing.  HENT:     Head: Normocephalic and atraumatic.  Pulmonary:     Effort: Pulmonary effort is normal.  Skin:    General: Skin is warm and dry.     Findings: Rash (erythematous papules scattered through out entire body - top of head to ankles, no blisters/open wounds or generalized erythema) present.  Neurological:     Mental Status: He is alert.            Assessment & Plan:    See Problem List for Assessment and Plan of chronic medical problems.    This visit occurred during the SARS-CoV-2 public health emergency.  Safety protocols were in place, including screening questions prior to the visit, additional usage of staff PPE, and extensive cleaning of exam room while observing appropriate contact time as indicated for disinfecting solutions.

## 2019-12-28 ENCOUNTER — Ambulatory Visit (INDEPENDENT_AMBULATORY_CARE_PROVIDER_SITE_OTHER): Payer: Medicare Other | Admitting: Internal Medicine

## 2019-12-28 ENCOUNTER — Encounter: Payer: Self-pay | Admitting: Internal Medicine

## 2019-12-28 ENCOUNTER — Other Ambulatory Visit: Payer: Self-pay

## 2019-12-28 DIAGNOSIS — L270 Generalized skin eruption due to drugs and medicaments taken internally: Secondary | ICD-10-CM

## 2019-12-28 MED ORDER — PREDNISONE 10 MG PO TABS
ORAL_TABLET | ORAL | 0 refills | Status: DC
Start: 2019-12-28 — End: 2020-02-09

## 2019-12-28 MED ORDER — LEVOCETIRIZINE DIHYDROCHLORIDE 5 MG PO TABS: 5.0000 mg | ORAL_TABLET | Freq: Every day | ORAL | 0 refills | Status: DC

## 2019-12-28 NOTE — Patient Instructions (Signed)
   Medications reviewed and updated.  Changes include :   Prednisone and an anti-histamine at bedtime for your rash.  Your prescription(s) have been submitted to your pharmacy. Please take as directed and contact our office if you believe you are having problem(s) with the medication(s).   Please call if there is no improvement in your symptoms.

## 2019-12-28 NOTE — Assessment & Plan Note (Signed)
Acute Likely related to doxycycline Prednisone helped, but rash returned before finishing it Prednisone taper - start at 40 mg and taper Q 3 days xyzal at night for itch  Can continue benadryl cream  call if there is no improvement in your symptoms.

## 2020-02-09 ENCOUNTER — Encounter: Payer: Self-pay | Admitting: Adult Health

## 2020-02-09 ENCOUNTER — Other Ambulatory Visit: Payer: Self-pay

## 2020-02-09 ENCOUNTER — Ambulatory Visit: Payer: Medicare Other | Admitting: Adult Health

## 2020-02-09 VITALS — BP 117/68 | HR 57 | Ht 72.0 in | Wt 200.0 lb

## 2020-02-09 DIAGNOSIS — I10 Essential (primary) hypertension: Secondary | ICD-10-CM | POA: Diagnosis not present

## 2020-02-09 DIAGNOSIS — I63512 Cerebral infarction due to unspecified occlusion or stenosis of left middle cerebral artery: Secondary | ICD-10-CM

## 2020-02-09 DIAGNOSIS — E785 Hyperlipidemia, unspecified: Secondary | ICD-10-CM | POA: Diagnosis not present

## 2020-02-09 NOTE — Patient Instructions (Signed)
Continue clopidogrel 75 mg daily  and lipitor  for secondary stroke prevention  Continue to follow up with PCP regarding cholesterol and blood pressure management   Continue to monitor blood pressure at home  Area on back of neck appears to be a type of cyst - likely sebaceous cyst which are usually not harmful. Please ensure follow up with Dr. Quay Burow for ongoing monitoring   Maintain strict control of hypertension with blood pressure goal below 130/90, diabetes with hemoglobin A1c goal below 6.5% and cholesterol with LDL cholesterol (bad cholesterol) goal below 70 mg/dL. I also advised the patient to eat a healthy diet with plenty of whole grains, cereals, fruits and vegetables, exercise regularly and maintain ideal body weight.         Thank you for coming to see [place] at [facility]. I hope we have been able to provide you high quality care today.  You may receive a patient satisfaction survey over the next few weeks. We would appreciate your feedback and comments so that we may continue to improve ourselves and the health of our patients.

## 2020-02-09 NOTE — Progress Notes (Signed)
I agree with the above plan 

## 2020-02-09 NOTE — Progress Notes (Signed)
[name] [address] [place]. [place] [address] [phone]       OFFICE FOLLOW UP NOTE  [name] Date of Birth:  26-Apr-1932 Medical Record Number:  [id number]   Reason for visit: Left BG/CR stroke 07/2018  CHIEF COMPLAINT:  Chief Complaint  Patient presents with  . Follow-up    rm 9 here for a stoke f/u. Pt is havig no new sx    HPI:   Today, 02/09/2020, Chad Norris is being seen for stroke follow-up.  Stable from a stroke standpoint without residual deficits stroke/TIA symptoms.  He remains on plavix without bleeding or bruising.  Continues on atorvastatin without myalgias.  Prior lipid panel satisfactory.  Blood pressure today 117/68. Continues to follow-up with audiology for hearing related concerns. No further concerns at this time.    History provided for reference purposes only Update 08/10/2019: Chad Norris is a 86 year old male who is being seen today for stroke follow-up.  He has been doing well from a stroke standpoint without new or recurring stroke/TIA symptoms.  Previously on Plavix but recently self discontinued due to incident where he cut himself shaving and reports increased bleeding.  He denies blood in his urine, stool or from his nose or any excessive bleeding. Continues on atorvastatin for secondary stroke prevention without side effects.  Blood pressure today satisfactory at 122/72. At prior visit, discussion regarding cochlear implant but due to undergoing anesthesia for 3-4 hours, wife does not feel comfortable with him pursing despite being cleared by other providers to undergo anesthesia. He does complain of increased fatigue during the day which has worsened over 3-4 months as he has not been sleeping well due to right shoulder pain. He continues to follow with orthopedics for ongoing shoulder pain with injections.  Previously evaluated by Dr. Rexene Norris for evaluation of possible sleep apnea but he declined further evaluation with sleep  study.  No further concerns at this time.  Update 04/09/2019: Chad Norris is being seen today for follow-up regarding prior stroke accompanied by his wife.  He has been doing well since prior visit without residual deficits or recurring symptoms.  Continues on Plavix and atorvastatin for secondary stroke prevention without side effects.  Blood pressure today satisfactory at 123/72.  He is currently being evaluated by [facility] audiology in regards to potentially undergoing cochlear implant device procedure.  He is questioning safety of pursuing procedure from a neurological standpoint.  No further concerns at this time. Initial visit 10/01/2018: He is being seen today for hospital follow-up and unfortunately return to ED on 09/26/2018 with symptoms of right-sided numbness and imaging did not show acute 9 mm infarct at margin of chronic infarct.  He has been doing well since this time without any residual or recurring symptoms.  He was restarted on 3 weeks DAPT which he continues at this time without side effects of bleeding or bruising.  Continues on atorvastatin without side effects myalgias.  Blood pressure today satisfactory 118/68.  He remains very active within the community where he is on the board of homeowners association along with frequently playing golf.  He also has drastically changed his diet with assistance of his wife.  He was evaluated by Dr. Rexene Norris yesterday, 10/01/2018, for potential OSA evaluation but at this time patient declines undergoing sleep study.  No further concerns at this time.  Denies new or worsening stroke/TIA symptoms.  Stroke admission 08/18/2018: Chad Norris is a 86 y.o. male with history of HLD  who  presented with slurred speech, generalized weakness and blurred vision.  CT head reviewed and was negative for hemorrhage but did show old left lenticular nucleus infarct versus chronic microvascular changes and atrophy.  MRI brain reviewed showed acute left lentiform nucleus  infarct.  MRA head showed occluded left A1, both ACAs fill from the right and decreased flow left ICA.  MRA neck normal.  2D echo showed an EF of 50 to 55%.  LDL 135 and initiated atorvastatin 40 mg daily.  A1c satisfactory at 5.8.  No antithrombotic PTA and recommended aspirin and Plavix for 3 weeks and aspirin alone.  Other stroke risk factors include advanced age, EtOH use and potential OSA.  He was discharged home in stable condition without therapy needs.       ROS:   14 system review of systems performed and negative with exception of hearing loss  PMH:  Past Medical History:  Diagnosis Date  . Colon polyps   . Diverticulosis   . Gout   . Hemorrhoids   . Hyperlipemia   . Nephrolithiasis   . Prostate cancer ([place])   . Stroke [facility])     PSH:  Past Surgical History:  Procedure Laterality Date  . CATARACT EXTRACTION W/ INTRAOCULAR LENS  IMPLANT, BILATERAL     bi-lat  . CERVICAL SPINE SURGERY    . COLONOSCOPY W/ POLYPECTOMY    . Epidural steroid injections     3-4, Dr Ellene Route  . RETROPUBIC PROSTATECTOMY    . SHOULDER SURGERY     left    Social History:  Social History   Socioeconomic History  . Marital status: Married    Spouse name: Not on file  . Number of children: 2  . Years of education: Not on file  . Highest education level: Not on file  Occupational History  . Occupation: retired  Tobacco Use  . Smoking status: Never Smoker  . Smokeless tobacco: Never Used  Substance and Sexual Activity  . Alcohol use: Yes    Alcohol/week: 1.0 standard drink    Types: 1 Cans of beer per week  . Drug use: No  . Sexual activity: Not on file  Other Topics Concern  . Not on file  Social History Narrative  . Not on file   Social Determinants of Health   Financial Resource Strain:   . Difficulty of Paying Living Expenses:   Food Insecurity:   . Worried About Charity fundraiser in the Last Year:   . Arboriculturist in the Last Year:   Transportation Needs:   .  Film/video editor (Medical):   [name] Lack of Transportation (Non-Medical):   Physical Activity:   . Days of Exercise per Week:   . Minutes of Exercise per Session:   Stress:   . Feeling of Stress :   Social Connections:   . Frequency of Communication with Friends and Family:   . Frequency of Social Gatherings with Friends and Family:   . Attends Religious Services:   . Active Member of Clubs or Organizations:   . Attends Archivist Meetings:   [name] Marital Status:   Intimate Partner Violence:   . Fear of Current or Ex-Partner:   . Emotionally Abused:   [name] Physically Abused:   . Sexually Abused:     Family History:  Family History  Problem Relation Age of Onset  . Kidney cancer Father   . Colon cancer Father   . Cancer Brother        ?  [name]  Heart attack Brother 43  . Heart attack Sister 56  . Breast cancer Sister   . Diabetes Brother   . Alcohol abuse Brother   . Colon polyps Brother     Medications:   Current Outpatient Medications on File Prior to Visit  Medication Sig Dispense Refill  . acetaminophen (TYLENOL) 500 MG tablet Take 1,000 mg by mouth every 6 (six) hours as needed (pain).    [name] atorvastatin (LIPITOR) 40 MG tablet TAKE 1 TABLET(40 MG) BY MOUTH DAILY AT 6 PM 90 tablet 1  . clopidogrel (PLAVIX) 75 MG tablet Take 1 tablet (75 mg total) by mouth daily. 90 tablet 3  . colchicine 0.6 MG tablet 1 po bid x 5 days 45 tablet 0  . levocetirizine (XYZAL) 5 MG tablet Take 1 tablet (5 mg total) by mouth at bedtime. 20 tablet 0  . Multiple Vitamins-Minerals (ADULT ONE DAILY GUMMIES) CHEW Chew 2 tablets by mouth daily. men     No current facility-administered medications on file prior to visit.    Allergies:   Allergies  Allergen Reactions  . Doxycycline     rash     Physical Exam  Vitals:   02/09/20 1247  BP: 117/68  Pulse: (!) 57  Weight: 200 lb (90.7 kg)  Height: 6' (1.829 m)   Body mass index is 27.12 kg/m. No exam data present  General: well  developed, well nourished, very pleasant elderly Caucasian male, seated, in no evident distress Head: head normocephalic and atraumatic.   Neck: supple with no carotid or supraclavicular bruits Cardiovascular: regular rate and rhythm, no murmurs Musculoskeletal: no deformity Skin:  no rash/petichiae Vascular:  Normal pulses all extremities  Neurologic Exam Mental Status: Awake and fully alert. Oriented to place and time. Recent and remote memory intact. Attention span, concentration and fund of knowledge appropriate. Mood and affect appropriate.  Cranial Nerves: Pupils equal, briskly reactive to light. Extraocular movements full without nystagmus. Visual fields full to confrontation.  HOH bilaterally. Facial sensation intact. Face, tongue, palate moves normally and symmetrically.  Motor: Normal bulk and tone. Normal strength in all tested extremity muscles. Sensory.: intact to touch , pinprick , position and vibratory sensation.  Coordination: Rapid alternating movements normal in all extremities. Finger-to-nose and heel-to-shin performed accurately bilaterally. Gait and Station: Arises from chair without difficulty. Stance is normal. Gait demonstrates normal stride length and balance. Able to heel, toe and tandem walk without difficulty.   Reflexes: 1+ and symmetric. Toes downgoing.        ASSESSMENT: Chad Norris is a 86 y.o. 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 Vascular risk factors include HTN and HLD.     PLAN:  1. Left lentiform nucleus infarct with expansion:  -No residual deficits.   -Continue clopidogrel 75 mg daily  and atorvastatin for secondary stroke prevention.  -Continue close PCP follow-up for aggressive stroke risk factor management 2. HTN: BP goal<130/90.  Stable today.  Continue to follow PCP for monitoring management 3. HLD: LDL goal <70.prior LDL 65.  Continue  atorvastatin and ongoing follow-up with PCP for prescribing, monitoring and management 4. Likely sebaceous cyst: Located posterior neck. Advised to monitor but also to ensure follow-up with PCP for ongoing monitoring and possible need of further management   Overall stable from stroke standpoint recommend follow-up as needed   I spent 25 minutes of face-to-face and non-face-to-face time with patient.  This included previsit chart review,  lab review, study review, order entry, electronic health record documentation, patient education regarding history of stroke, importance of managing stroke risk factors and answered all questions to patient satisfaction    Frann Rider, [facility]  [facility] [address] [place], [place] [address]  Phone [phone] Fax [phone] Note: This document was prepared with digital dictation and possible smart phrase technology. Any transcriptional errors that result from this process are unintentional.

## 2020-03-25 ENCOUNTER — Telehealth: Payer: Self-pay | Admitting: Internal Medicine

## 2020-03-25 ENCOUNTER — Ambulatory Visit: Payer: Medicare Other | Attending: Internal Medicine

## 2020-03-25 ENCOUNTER — Encounter (HOSPITAL_BASED_OUTPATIENT_CLINIC_OR_DEPARTMENT_OTHER): Payer: Self-pay

## 2020-03-25 ENCOUNTER — Other Ambulatory Visit: Payer: Self-pay

## 2020-03-25 ENCOUNTER — Emergency Department (HOSPITAL_BASED_OUTPATIENT_CLINIC_OR_DEPARTMENT_OTHER): Payer: Medicare Other

## 2020-03-25 DIAGNOSIS — Z23 Encounter for immunization: Secondary | ICD-10-CM | POA: Insufficient documentation

## 2020-03-25 DIAGNOSIS — W108XXA Fall (on) (from) other stairs and steps, initial encounter: Secondary | ICD-10-CM | POA: Insufficient documentation

## 2020-03-25 DIAGNOSIS — H748X2 Other specified disorders of left middle ear and mastoid: Secondary | ICD-10-CM | POA: Diagnosis not present

## 2020-03-25 DIAGNOSIS — Y9289 Other specified places as the place of occurrence of the external cause: Secondary | ICD-10-CM | POA: Insufficient documentation

## 2020-03-25 DIAGNOSIS — Y939 Activity, unspecified: Secondary | ICD-10-CM | POA: Diagnosis not present

## 2020-03-25 DIAGNOSIS — Z8546 Personal history of malignant neoplasm of prostate: Secondary | ICD-10-CM | POA: Diagnosis not present

## 2020-03-25 DIAGNOSIS — S199XXA Unspecified injury of neck, initial encounter: Secondary | ICD-10-CM | POA: Diagnosis not present

## 2020-03-25 DIAGNOSIS — S0083XA Contusion of other part of head, initial encounter: Secondary | ICD-10-CM | POA: Diagnosis not present

## 2020-03-25 DIAGNOSIS — Y999 Unspecified external cause status: Secondary | ICD-10-CM | POA: Insufficient documentation

## 2020-03-25 DIAGNOSIS — Z7901 Long term (current) use of anticoagulants: Secondary | ICD-10-CM | POA: Diagnosis not present

## 2020-03-25 DIAGNOSIS — I6381 Other cerebral infarction due to occlusion or stenosis of small artery: Secondary | ICD-10-CM | POA: Diagnosis not present

## 2020-03-25 DIAGNOSIS — Z79899 Other long term (current) drug therapy: Secondary | ICD-10-CM | POA: Insufficient documentation

## 2020-03-25 DIAGNOSIS — S0990XA Unspecified injury of head, initial encounter: Secondary | ICD-10-CM | POA: Diagnosis not present

## 2020-03-25 DIAGNOSIS — G319 Degenerative disease of nervous system, unspecified: Secondary | ICD-10-CM | POA: Diagnosis not present

## 2020-03-25 DIAGNOSIS — S0003XA Contusion of scalp, initial encounter: Secondary | ICD-10-CM | POA: Diagnosis not present

## 2020-03-25 DIAGNOSIS — S0101XA Laceration without foreign body of scalp, initial encounter: Secondary | ICD-10-CM | POA: Diagnosis not present

## 2020-03-25 DIAGNOSIS — S0001XA Abrasion of scalp, initial encounter: Secondary | ICD-10-CM | POA: Insufficient documentation

## 2020-03-25 DIAGNOSIS — Y929 Unspecified place or not applicable: Secondary | ICD-10-CM | POA: Diagnosis not present

## 2020-03-25 NOTE — Progress Notes (Signed)
   Covid-19 Vaccination Clinic  Name:  Chad Norris    MRN: [id number] DOB: Jul 14, 1932  03/25/2020  Chad Norris was observed post Covid-19 immunization for 15 minutes without incident. He was provided with Vaccine Information Sheet and instruction to access the V-Safe system.   Chad Norris was instructed to call 911 with any severe reactions post vaccine: [name] Difficulty breathing  . Swelling of face and throat  . A fast heartbeat  . A bad rash all over body  . Dizziness and weakness

## 2020-03-25 NOTE — Progress Notes (Signed)
  Chronic Care Management   Note  03/25/2020 Name: Seanpatrick Maisano MRN: [id number] DOB: [birth year]  Argelio Granier is a 86 y.o. year old male who is a primary care patient of Burns, Claudina Lick, MD. I reached out to Jerolyn Shin by phone today in response to a referral sent by Mr. Cornellius Sherrer's PCP, Binnie Rail, MD.   Mr. Rakestraw was given information about Chronic Care Management services today including:  1. CCM service includes personalized support from designated clinical staff supervised by his physician, including individualized plan of care and coordination with other care providers 2. 24/7 contact phone numbers for assistance for urgent and routine care needs. 3. Service will only be billed when office clinical staff spend 20 minutes or more in a month to coordinate care. 4. Only one practitioner may furnish and Ansel the service in a calendar month. 5. The patient may stop CCM services at any time (effective at the end of the month) by phone call to the office staff.   Patient agreed to services and verbal consent obtained.   Follow up plan:   Earney Hamburg Upstream Scheduler

## 2020-03-25 NOTE — ED Triage Notes (Signed)
Pt fell down brick steps down patio tonight, pt on anticoags. Bleeding controled, pt A&Ox4

## 2020-03-26 ENCOUNTER — Emergency Department (HOSPITAL_BASED_OUTPATIENT_CLINIC_OR_DEPARTMENT_OTHER)
Admission: EM | Admit: 2020-03-26 | Discharge: 2020-03-26 | Disposition: A | Discharge status: Home / Self Care | Payer: Medicare Other | Attending: Emergency Medicine | Admitting: Emergency Medicine

## 2020-03-26 DIAGNOSIS — S0101XA Laceration without foreign body of scalp, initial encounter: Secondary | ICD-10-CM

## 2020-03-26 DIAGNOSIS — S0001XA Abrasion of scalp, initial encounter: Secondary | ICD-10-CM

## 2020-03-26 DIAGNOSIS — W108XXA Fall (on) (from) other stairs and steps, initial encounter: Secondary | ICD-10-CM

## 2020-03-26 DIAGNOSIS — S0083XA Contusion of other part of head, initial encounter: Secondary | ICD-10-CM

## 2020-03-26 DIAGNOSIS — S0990XA Unspecified injury of head, initial encounter: Secondary | ICD-10-CM

## 2020-03-26 MED ORDER — LIDOCAINE-EPINEPHRINE 1 %-1:100000 IJ SOLN
INTRAMUSCULAR | Status: AC
  Administered 2020-03-26: 1 mL
  Filled 2020-03-26: qty 1

## 2020-03-26 MED ORDER — TETANUS-DIPHTH-ACELL PERTUSSIS 5-2.5-18.5 LF-MCG/0.5 IM SUSP
0.5000 mL | Freq: Once | INTRAMUSCULAR | Status: AC
  Administered 2020-03-26: 0.5 mL via INTRAMUSCULAR
  Filled 2020-03-26: qty 0.5

## 2020-03-26 NOTE — ED Provider Notes (Signed)
[facility] Provider Note: [name], MD, FACEP  CSN: [id number] MRN: [id number] ARRIVAL: 03/25/20 at 2145 ROOM: [place]  Head Injury   HISTORY OF PRESENT ILLNESS  03/26/20 2:32 AM Chad Norris is a 86 y.o. male who fell down brick steps onto a patio yesterday evening just prior to arrival.  He struck the back of his head.  He has a wound on the back of his head which was bleeding earlier but bleeding is now controlled.  He rates associated pain is a 4 out of 10, worse with palpation.  He denies neck pain, nausea or vomiting.  He is on Plavix.  Past Medical History:  Diagnosis Date  . Colon polyps   . Diverticulosis   . Gout   . Hemorrhoids   . Hyperlipemia   . Nephrolithiasis   . Prostate cancer ([place])   . Stroke [facility])     Past Surgical History:  Procedure Laterality Date  . CATARACT EXTRACTION W/ INTRAOCULAR LENS  IMPLANT, BILATERAL     bi-lat  . CERVICAL SPINE SURGERY    . COLONOSCOPY W/ POLYPECTOMY    . Epidural steroid injections     3-4, Dr Ellene Route  . RETROPUBIC PROSTATECTOMY    . SHOULDER SURGERY     left    Family History  Problem Relation Age of Onset  . Kidney cancer Father   . Colon cancer Father   . Cancer Brother        ?  [name] Heart attack Brother 13  . Heart attack Sister 55  . Breast cancer Sister   . Diabetes Brother   . Alcohol abuse Brother   . Colon polyps Brother     Social History   Tobacco Use  . Smoking status: Never Smoker  . Smokeless tobacco: Never Used  Substance Use Topics  . Alcohol use: Yes    Alcohol/week: 1.0 standard drink    Types: 1 Cans of beer per week  . Drug use: No    Prior to Admission medications   Medication Sig Start Date End Date Taking? Authorizing Provider  acetaminophen (TYLENOL) 500 MG tablet Take 1,000 mg by mouth every 6 (six) hours as needed (pain).    [provider]  atorvastatin (LIPITOR) 40 MG tablet TAKE 1 TABLET(40 MG) BY MOUTH DAILY AT 6 PM  09/22/19   Burns, Claudina Lick, MD  clopidogrel (PLAVIX) 75 MG tablet Take 1 tablet (75 mg total) by mouth daily. 04/09/19   Frann Rider, NP  colchicine 0.6 MG tablet 1 po bid x 5 days 06/09/19   Meredith Pel, MD  levocetirizine (XYZAL) 5 MG tablet Take 1 tablet (5 mg total) by mouth at bedtime. 12/28/19   Binnie Rail, MD  Multiple Vitamins-Minerals (ADULT ONE DAILY GUMMIES) CHEW Chew 2 tablets by mouth daily. men    [provider]    Allergies Doxycycline   REVIEW OF SYSTEMS  Negative except as noted here or in the History of Present Illness.   PHYSICAL EXAMINATION  Initial Vital Signs Blood pressure 139/79, pulse 64, temperature 98.8 F (37.1 C), temperature source Oral, resp. rate 18, height 6' (1.829 m), weight 88.5 kg, SpO2 98 %.  Examination General: Well-developed, well-nourished male in no acute distress; appearance consistent with age of record HENT: normocephalic; no hemotympanum; occipital hematoma with overlying abrasion and laceration:    Eyes: pupils equal, round and reactive to light; extraocular muscles intact Neck: supple Heart: regular rate and rhythm;  no murmurs, rubs or gallops Lungs: clear to auscultation bilaterally Abdomen: soft; nondistended; nontender; bowel sounds present Extremities: No deformity; full range of motion; pulses normal Neurologic: Awake, alert; motor function intact in all extremities and symmetric; no facial droop; hard of hearing Skin: Warm and dry Psychiatric: Normal mood and affect   RESULTS  Summary of this visit's results, reviewed and interpreted by myself:   EKG Interpretation  Date/Time:    Ventricular Rate:    PR Interval:    QRS Duration:   QT Interval:    QTC Calculation:   R Axis:     Text Interpretation:        Laboratory Studies: No results found for this or any previous visit (from the past 24 hour(s)). Imaging Studies: CT Head Wo Contrast  Result Date: 03/25/2020 CLINICAL DATA:  Head  trauma, mod-severe Fall down brick steps on patio tonight.  On anticoagulation. EXAM: CT HEAD WITHOUT CONTRAST TECHNIQUE: Contiguous axial images were obtained from the base of the skull through the vertex without intravenous contrast. COMPARISON:  Head CT and brain MRI 08/18/2018 FINDINGS: Brain: Stable degree of atrophy and chronic small vessel ischemia. Remote lacunar infarct in the left basal ganglia. No intracranial hemorrhage, mass effect, or midline shift. No hydrocephalus. The basilar cisterns are patent. No evidence of territorial infarct or acute ischemia. No extra-axial or intracranial fluid collection. Vascular: Atherosclerosis of skullbase vasculature without hyperdense vessel or abnormal calcification. Skull: No fracture or focal lesion. Sinuses/Orbits: Occasional chronic opacification of lower left mastoid air cells. No mastoid effusion. Bilateral cataract resection. No acute findings. Other: Small posterior occipital scalp hematoma. IMPRESSION: 1. Small posterior occipital scalp hematoma. No acute intracranial abnormality. No skull fracture. 2. Stable atrophy and chronic small vessel ischemia. Electronically Signed   By: Keith Rake M.D.   On: 03/25/2020 22:53   CT Cervical Spine Wo Contrast  Result Date: 03/25/2020 CLINICAL DATA:  Fall down brick steps tonight on patio. On anticoagulation. Polytrauma, critical, head/C-spine injury suspected EXAM: CT CERVICAL SPINE WITHOUT CONTRAST TECHNIQUE: Multidetector CT imaging of the cervical spine was performed without intravenous contrast. Multiplanar CT image reconstructions were also generated. COMPARISON:  Cervical MRI 06/05/2019 FINDINGS: Alignment: Straightening of normal lordosis. Minimal anterolisthesis of C7 on T1, unchanged. No traumatic subluxation. Skull base and vertebrae: Anterior fusion C5-C7. Hardware is intact. No acute fracture. Unfused ossicle versus remote fracture of T1 spinous process, chronic. Vertebral body heights are  maintained. The dens and skull base are intact. Degenerative changes C1-C2. Soft tissues and spinal canal: No prevertebral fluid or swelling. No visible canal hematoma. Disc levels: Anterior fusion with interbody spacers at C5-C6 and C6-C7. Mild disc space narrowing with endplate spurring at B7-S2. Upper chest: No acute findings. Other: None. IMPRESSION: 1. No acute fracture or subluxation of the cervical spine. 2. Anterior fusion C5-C7 without hardware complication. Electronically Signed   By: Keith Rake M.D.   On: 03/25/2020 22:56    ED COURSE and MDM  Nursing notes, initial and subsequent vitals signs, including pulse oximetry, reviewed and interpreted by myself.  Vitals:   03/25/20 2152 03/25/20 2153 03/26/20 0054  BP: (!) 155/71  139/79  Pulse: (!) 58  64  Resp: 17  18  Temp: 98.8 F (37.1 C)    TempSrc: Oral    SpO2: 96%  98%  Weight:  88.5 kg   Height:  6' (1.829 m)    Medications  Tdap (BOOSTRIX) injection 0.5 mL (0.5 mLs Intramuscular Given 03/26/20 0245)  lidocaine-EPINEPHrine (XYLOCAINE W/EPI)  1 %-1:100000 (with pres) injection (1 mL  Given 03/26/20 0248)      PROCEDURES  Procedures   LACERATION REPAIR Performed by: Karen Chafe Tymel Conely Authorized by: Karen Chafe Rhian Funari Consent: Verbal consent obtained. Risks and benefits: risks, benefits and alternatives were discussed Consent given by: patient Patient identity confirmed: provided demographic data Prepped and Draped in normal sterile fashion Wound explored  Laceration Location: Occiput  Laceration Length: 2 cm  No Foreign Bodies seen or palpated  Anesthesia: local infiltration  Local anesthetic: lidocaine 2% with epinephrine  Anesthetic total: 3 ml  Irrigation method: syringe Amount of cleaning: standard  Skin closure: Staples  Number of staples: 3  Patient tolerance: Patient tolerated the procedure well with no immediate complications.   ED DIAGNOSES     ICD-10-CM   1. Fall on steps, initial encounter   W10.8XXA   2. Minor head injury, initial encounter  S09.90XA   3. Hematoma of occipital surface of head, initial encounter  S00.83XA   4. Abrasion of scalp, initial encounter  S00.01XA   5. Scalp laceration, initial encounter  S01.01XA        Brena Windsor, Jenny Reichmann, MD 03/26/20 [phone]

## 2020-03-30 DIAGNOSIS — S0191XA Laceration without foreign body of unspecified part of head, initial encounter: Secondary | ICD-10-CM | POA: Insufficient documentation

## 2020-03-30 NOTE — Progress Notes (Signed)
Subjective:    Patient ID: Chad Norris, male    DOB: 01/04/1932, 86 y.o.   MRN: [id number]  HPI The patient is here for an acute visit.   He fell 9/3 and went to the ED.  He fell down brick steps onto his patio.  He struck the back of his head.  It bled, but by the time he was seen in the ED the bleeding had stopped. His pain was 4/10 and worse with palpation.  He denied neck pain, N/V, blurry vision.  He had a tdap.  Wound repaired with 3 staples.    He states some discomfort at the site of the laceration.  He denies headaches, blurry vision and dizziness/lightheadedness.   Medications and allergies reviewed with patient and updated if appropriate.  Patient Active Problem List   Diagnosis Date Noted  . Laceration of head 03/30/2020  . Allergic drug rash 12/28/2019  . Acute bronchitis 12/10/2019  . Wheezing 12/10/2019  . Swelling of right wrist 06/11/2019  . Cervical radiculopathy 06/11/2019  . Sensorineural hearing loss, bilateral 05/21/2019  . Acute CVA (cerebrovascular accident) ([place]) 08/19/2018  . Stroke ([place]) 08/18/2018  . Sore throat 06/25/2018  . Well adult exam 03/27/2018  . Hyperlipidemia 03/27/2018  . Basal cell carcinoma (BCC) of [place] 03/11/2017  . Prediabetes 03/11/2017  . DDD (degenerative disc disease), lumbosacral 01/13/2014  . NONSPECIFIC ABNORMAL ELECTROCARDIOGRAM 06/21/2008  . PROSTATE CANCER, HX OF 06/21/2008  . COLONIC POLYPS, HX OF 06/21/2008  . DIVERTICULOSIS, COLON 05/07/2008  . NEPHROLITHIASIS, HX OF 05/07/2008  . History of gout 10/09/2007    Current Outpatient Medications on File Prior to Visit  Medication Sig Dispense Refill  . acetaminophen (TYLENOL) 500 MG tablet Take 1,000 mg by mouth every 6 (six) hours as needed (pain).    [name] atorvastatin (LIPITOR) 40 MG tablet TAKE 1 TABLET(40 MG) BY MOUTH DAILY AT 6 PM 90 tablet 1  . clopidogrel (PLAVIX) 75 MG tablet Take 1 tablet (75 mg total) by mouth daily. 90 tablet 3  . Multiple  Vitamins-Minerals (ADULT ONE DAILY GUMMIES) CHEW Chew 2 tablets by mouth daily. men    . colchicine 0.6 MG tablet 1 po bid x 5 days 45 tablet 0  . levocetirizine (XYZAL) 5 MG tablet Take 1 tablet (5 mg total) by mouth at bedtime. 20 tablet 0   No current facility-administered medications on file prior to visit.    Past Medical History:  Diagnosis Date  . Colon polyps   . Diverticulosis   . Gout   . Hemorrhoids   . Hyperlipemia   . Nephrolithiasis   . Prostate cancer ([place])   . Stroke [facility])     Past Surgical History:  Procedure Laterality Date  . CATARACT EXTRACTION W/ INTRAOCULAR LENS  IMPLANT, BILATERAL     bi-lat  . CERVICAL SPINE SURGERY    . COLONOSCOPY W/ POLYPECTOMY    . Epidural steroid injections     3-4, Dr Ellene Route  . RETROPUBIC PROSTATECTOMY    . SHOULDER SURGERY     left    Social History   Socioeconomic History  . Marital status: Married    Spouse name: Not on file  . Number of children: 2  . Years of education: Not on file  . Highest education level: Not on file  Occupational History  . Occupation: retired  Tobacco Use  . Smoking status: Never Smoker  . Smokeless tobacco: Never Used  Substance and Sexual Activity  . Alcohol use:  Yes    Alcohol/week: 1.0 standard drink    Types: 1 Cans of beer per week  . Drug use: No  . Sexual activity: Not on file  Other Topics Concern  . Not on file  Social History Narrative  . Not on file   Social Determinants of Health   Financial Resource Strain:   . Difficulty of Paying Living Expenses: Not on file  Food Insecurity:   . Worried About Charity fundraiser in the Last Year: Not on file  . Ran Out of Food in the Last Year: Not on file  Transportation Needs:   . Lack of Transportation (Medical): Not on file  . Lack of Transportation (Non-Medical): Not on file  Physical Activity:   . Days of Exercise per Week: Not on file  . Minutes of Exercise per Session: Not on file  Stress:   . Feeling of Stress :  Not on file  Social Connections:   . Frequency of Communication with Friends and Family: Not on file  . Frequency of Social Gatherings with Friends and Family: Not on file  . Attends Religious Services: Not on file  . Active Member of Clubs or Organizations: Not on file  . Attends Archivist Meetings: Not on file  . Marital Status: Not on file    Family History  Problem Relation Age of Onset  . Kidney cancer Father   . Colon cancer Father   . Cancer Brother        ?  [name] Heart attack Brother 41  . Heart attack Sister 71  . Breast cancer Sister   . Diabetes Brother   . Alcohol abuse Brother   . Colon polyps Brother     Review of Systems  Constitutional: Negative for fever.  Eyes: Negative for visual disturbance.  Gastrointestinal: Negative for nausea.  Neurological: Negative for dizziness, weakness, light-headedness, numbness and headaches.       Objective:   Vitals:   03/31/20 1044  BP: 120/70  Pulse: (!) 53  Temp: 98.6 F (37 C)  SpO2: 97%   BP Readings from Last 3 Encounters:  03/31/20 120/70  03/26/20 139/79  02/09/20 117/68   Wt Readings from Last 3 Encounters:  03/31/20 198 lb 3.2 oz (89.9 kg)  03/25/20 195 lb (88.5 kg)  02/09/20 200 lb (90.7 kg)   Body mass index is 26.88 kg/m.   Physical Exam Constitutional:      General: He is not in acute distress.    Appearance: Normal appearance. He is not ill-appearing.  HENT:     Head: Normocephalic.     Comments: Laceration posterior head with 3 staples, healing well, scabbed over, no opening bleeding or discharge Skin:    General: Skin is warm and dry.     Findings: No erythema.  Neurological:     General: No focal deficit present.     Mental Status: He is alert.        After verbal consent 3 staples removed from posterior head laceration. There was minimal bleeding with removal     Assessment & Plan:    See Problem List for Assessment and Plan of chronic medical problems.     This visit occurred during the SARS-CoV-2 public health emergency.  Safety protocols were in place, including screening questions prior to the visit, additional usage of staff PPE, and extensive cleaning of exam room while observing appropriate contact time as indicated for disinfecting solutions.

## 2020-03-31 ENCOUNTER — Ambulatory Visit (INDEPENDENT_AMBULATORY_CARE_PROVIDER_SITE_OTHER): Payer: Medicare Other | Admitting: Internal Medicine

## 2020-03-31 ENCOUNTER — Other Ambulatory Visit: Payer: Self-pay

## 2020-03-31 ENCOUNTER — Encounter: Payer: Self-pay | Admitting: Internal Medicine

## 2020-03-31 DIAGNOSIS — S0101XA Laceration without foreign body of scalp, initial encounter: Secondary | ICD-10-CM | POA: Diagnosis not present

## 2020-03-31 NOTE — Patient Instructions (Addendum)
Your Staples were removed.     Your head injury is healing well.  Just let water / soap wash over it and the scab will come off.

## 2020-03-31 NOTE — Assessment & Plan Note (Signed)
Acute Reviewed ED records No concerning symptoms today 3 staples removed today - laceration healing well Ok to let soap / water run over posterior head Discussed fall prevention  Call with concerns

## 2020-04-05 DIAGNOSIS — H524 Presbyopia: Secondary | ICD-10-CM | POA: Diagnosis not present

## 2020-04-05 DIAGNOSIS — Z961 Presence of intraocular lens: Secondary | ICD-10-CM | POA: Diagnosis not present

## 2020-04-08 ENCOUNTER — Encounter: Payer: Self-pay | Admitting: Internal Medicine

## 2020-04-21 ENCOUNTER — Ambulatory Visit (INDEPENDENT_AMBULATORY_CARE_PROVIDER_SITE_OTHER): Payer: Medicare Other

## 2020-04-21 ENCOUNTER — Other Ambulatory Visit: Payer: Self-pay

## 2020-04-21 DIAGNOSIS — Z23 Encounter for immunization: Secondary | ICD-10-CM | POA: Diagnosis not present

## 2020-06-06 ENCOUNTER — Ambulatory Visit: Payer: Medicare Other | Admitting: Pharmacist

## 2020-06-06 ENCOUNTER — Other Ambulatory Visit: Payer: Self-pay

## 2020-06-06 DIAGNOSIS — E785 Hyperlipidemia, unspecified: Secondary | ICD-10-CM

## 2020-06-06 DIAGNOSIS — I639 Cerebral infarction, unspecified: Secondary | ICD-10-CM

## 2020-06-06 NOTE — Chronic Care Management (AMB) (Signed)
Chronic Care Management Pharmacy  Name: Chad Norris  MRN: [id number] DOB: 1931/10/27   Chief Complaint/ HPI  Chad Norris,  86 y.o. , male presents for their Initial CCM visit with the clinical pharmacist via telephone due to COVID-19 Pandemic.  PCP : Binnie Rail, MD Patient Care Team: Binnie Rail, MD as PCP - General (Internal Medicine) Charlton Haws, [facility] as Pharmacist (Pharmacist)  Their chronic conditions include: Hyperlipidemia, Gout and Prediabetes, Hx stroke (07/2018), DDD  Office Visits: 03/31/20 Dr Quay Burow OV: acute visit for fall / ED f/u, w/ head injury. Staples removed.  12/28/19 Dr Quay Burow OV: acute visit for allergic drug rash, likely from doxycycline. Initial prednisone pack initially improved rash but it came back. Rx'd prednisone 40 mg taper, Xyzal, continue Benadryl cream.  Consult Visit: 04/05/20 Dr Oneida Arenas [facility] eye care): eye exam 03/25/20 ED visit: fell down stairs, head injury, CT head no acute changes. Given Tdap and staples 02/09/20 NP Frann Rider (neurology): stroke f/u, w/o residual deficits. Overall stable, prn f/u,  No med changes. 11/25/19 Dr Fontaine No (dermatology): f/u for seborrheic keratosis 10/19/19 Dr Junius Roads (ortho): f/u for bilateral shoulder pain, steroid injection given.   Allergies  Allergen Reactions  . Doxycycline     rash    Medications: Outpatient Encounter Medications as of 06/06/2020  Medication Sig  . acetaminophen (TYLENOL) 500 MG tablet Take 1,000 mg by mouth every 6 (six) hours as needed (pain).  [name] atorvastatin (LIPITOR) 40 MG tablet TAKE 1 TABLET(40 MG) BY MOUTH DAILY AT 6 PM  . clopidogrel (PLAVIX) 75 MG tablet Take 1 tablet (75 mg total) by mouth daily.  . Multiple Vitamins-Minerals (ADULT ONE DAILY GUMMIES) CHEW Chew 2 tablets by mouth daily. men   No facility-administered encounter medications on file as of 06/06/2020.    Wt Readings from Last 3 Encounters:  03/31/20 198 lb 3.2 oz (89.9 kg)  03/25/20 195 lb (88.5  kg)  02/09/20 200 lb (90.7 kg)    Current Diagnosis/Assessment:  SDOH Interventions     Most Recent Value  SDOH Interventions  Financial Strain Interventions Intervention Not Indicated      Goals Addressed            This Visit's Progress   . Pharmacy Care Plan       CARE PLAN ENTRY (see longitudinal plan of care for additional care plan information)  Current Barriers:  . Chronic Disease Management support, education, and care coordination needs related to Hyperlipidemia, Stroke   Hyperlipidemia / Hx stroke Lab Results  Component Value Date/Time   LDLCALC 65 04/09/2019 03:40 PM   LDLCALC 126 (H) 01/13/2014 10:37 AM   LDLDIRECT 122.0 03/27/2018 08:52 AM .  Pharmacist Clinical Goal(s): o Over the next 365 days, patient will work with PharmD and providers to maintain LDL goal < 70 . Current regimen:  o Atorvastatin 40 mg daily o Clopidogrel 75 mg daily . Interventions: o Discussed cholesterol goals and benefits of medications for prevention of heart attack / stroke . Patient self care activities - Over the next 365 days, patient will: o Continue current medications  Medication management . Pharmacist Clinical Goal(s): o Over the next 365 days, patient will work with PharmD and providers to maintain optimal medication adherence . Current pharmacy: [facility] . Interventions o Comprehensive medication review performed. o Continue current medication management strategy . Patient self care activities - Over the next 365 days, patient will: o Focus on medication adherence by fill date o Take medications as prescribed  o Report any questions or concerns to PharmD and/or provider(s)  Initial goal documentation       Hyperlipidemia / CVA   LDL goal < 70 Hx stroke (07/2018)  Last lipids Lab Results  Component Value Date   CHOL 127 04/09/2019   HDL 41 04/09/2019   LDLCALC 65 04/09/2019   LDLDIRECT 122.0 03/27/2018   TRIG 118 04/09/2019   CHOLHDL 3.1 04/09/2019    Hepatic Function Latest Ref Rng & Units 06/11/2019 08/18/2018 03/27/2018  Total Protein 6.0 - 8.3 g/dL 8.0 6.9 6.8  Albumin 3.5 - 5.2 g/dL 4.6 4.0 4.3  AST 0 - 37 U/L _0 ALT 0 - 53 U/L _1 Alk Phosphatase 39 - 117 U/L 57 35(L) 39  Total Bilirubin 0.2 - 1.2 mg/dL 0.9 0.9 0.9  Bilirubin, Direct 0.0 - 0.3 mg/dL - - -     The ASCVD Risk score ([place] et al., 2013) failed to calculate for the following reasons:   The 2013 ASCVD risk score is only valid for ages 25 to 6   The patient has a prior MI or stroke diagnosis   Patient has failed these meds in past: n/a Patient is currently controlled on the following medications:  . Atorvastatin 40 mg daily . Clopidogrel 75 mg daily  We discussed:  diet and exercise extensively; Cholesterol goals; benefits of statin for ASCVD risk reduction  Plan  Continue current medications  Prediabetes   A1c goal <6.5%  Recent Relevant Labs: Lab Results  Component Value Date/Time   HGBA1C 5.8 (H) 08/19/2018 02:12 AM   HGBA1C 6.0 03/11/2017 10:59 AM   GFR 67.80 06/11/2019 12:03 PM   GFR 56.36 (L) 03/27/2018 08:52 AM    Last diabetic Eye exam: No results found for: HMDIABEYEEXA  Last diabetic Foot exam: No results found for: HMDIABFOOTEX   Patient has failed these meds in past: n/a Patient is currently controlled on the following medications: . No medications  We discussed: diet and exercise extensively  Plan  Continue control with diet and exercise  Pain   Degenerative disc disease, lumbosacral Cervical radiculopathy  Patient has failed these meds in past: n/a Patient is currently controlled on the following medications:  . Tylenol 500 mg PRN  We discussed: Pt reports he uses Tylenol occasionally; Patient is satisfied with current regimen and denies issues  Plan  Continue current medications  Vaccines   Reviewed and discussed patient's vaccination history.    Immunization History  Administered Date(s)  Administered  . Fluad Quad(high Dose 65+) 04/01/2019, 04/21/2020  . H1N1 07/28/2008  . Influenza Split 05/08/2012  . Influenza Whole 04/19/2010  . Influenza, High Dose Seasonal PF 05/12/2013, 04/05/2017, 04/24/2018  . Influenza,inj,Quad PF,6+ Mos 04/21/2014, 04/22/2015  . PFIZER SARS-COV-2 Vaccination 08/01/2019, 08/22/2019, 03/25/2020  . Pneumococcal Conjugate-13 05/13/2015  . Pneumococcal Polysaccharide-23 03/11/2017  . Tdap 06/11/2019, 03/26/2020    Plan  Recommended patient receive Shingrix vaccine  Health Maintenance   Patient is currently controlled on the following medications:  [name] Multivitamin gummy . Vitamin D  We discussed:  Patient is satisfied with current regimen and denies issues  Plan  Continue current medications  Medication Management   Pt uses [place] for all medications Uses pill box? No - prefers bottles Pt endorses 100% compliance  We discussed: Current pharmacy is preferred with insurance plan and patient is satisfied with pharmacy services  Plan  Continue current medication management strategy    Follow up: 12 month phone  visit  [name], PharmD, BCACP Clinical Pharmacist [facility] at [facility] [phone]

## 2020-06-06 NOTE — Patient Instructions (Addendum)
Visit Information  Phone number for Pharmacist: [phone]  Thank you for meeting with me to discuss your medications! I look forward to working with you to achieve your health care goals. Below is a summary of what we talked about during the visit:  Goals Addressed            This Visit's Progress   . Pharmacy Care Plan       CARE PLAN ENTRY (see longitudinal plan of care for additional care plan information)  Current Barriers:  . Chronic Disease Management support, education, and care coordination needs related to Hyperlipidemia, Stroke   Hyperlipidemia / Hx stroke Lab Results  Component Value Date/Time   LDLCALC 65 04/09/2019 03:40 PM   LDLCALC 126 (H) 01/13/2014 10:37 AM   LDLDIRECT 122.0 03/27/2018 08:52 AM .  Pharmacist Clinical Goal(s): o Over the next 365 days, patient will work with PharmD and providers to maintain LDL goal < 70 . Current regimen:  o Atorvastatin 40 mg daily o Clopidogrel 75 mg daily . Interventions: o Discussed cholesterol goals and benefits of medications for prevention of heart attack / stroke . Patient self care activities - Over the next 365 days, patient will: o Continue current medications  Medication management . Pharmacist Clinical Goal(s): o Over the next 365 days, patient will work with PharmD and providers to maintain optimal medication adherence . Current pharmacy: [facility] . Interventions o Comprehensive medication review performed. o Continue current medication management strategy . Patient self care activities - Over the next 365 days, patient will: o Focus on medication adherence by fill date o Take medications as prescribed o Report any questions or concerns to PharmD and/or provider(s)  Initial goal documentation      Mr. [name] was given information about Chronic Care Management services today including:  1. CCM service includes personalized support from designated clinical staff supervised by his physician,  including individualized plan of care and coordination with other care providers 2. 24/7 contact phone numbers for assistance for urgent and routine care needs. 3. Standard insurance, coinsurance, copays and deductibles apply for chronic care management only during months in which we provide at least 20 minutes of these services. Most insurances cover these services at 100%, however patients may be responsible for any copay, coinsurance and/or deductible if applicable. This service may help you avoid the need for more expensive face-to-face services. 4. Only one practitioner may furnish and [place] the service in a calendar month. 5. The patient may stop CCM services at any time (effective at the end of the month) by phone call to the office staff.  Patient agreed to services and verbal consent obtained.   The patient verbalized understanding of instructions, educational materials, and care plan provided today and declined offer to receive copy of patient instructions, educational materials, and care plan.  Telephone follow up appointment with pharmacy team member scheduled for: 62 months  [name], PharmD, BCACP Clinical Pharmacist [facility] at [place] Maintenance After Age 58 After age 19, you are at a higher risk for certain long-term diseases and infections as well as injuries from falls. Falls are a major cause of broken bones and head injuries in people who are older than age 49. Getting regular preventive care can help to keep you healthy and well. Preventive care includes getting regular testing and making lifestyle changes as recommended by your health care provider. Talk with your health care provider about:  Which screenings and tests you should  have. A screening is a test that checks for a disease when you have no symptoms.  A diet and exercise plan that is right for you. What should I know about screenings and tests to prevent  falls? Screening and testing are the best ways to find a health problem early. Early diagnosis and treatment give you the best chance of managing medical conditions that are common after age 60. Certain conditions and lifestyle choices may make you more likely to have a fall. Your health care provider may recommend:  Regular vision checks. Poor vision and conditions such as cataracts can make you more likely to have a fall. If you wear glasses, make sure to get your prescription updated if your vision changes.  Medicine review. Work with your health care provider to regularly review all of the medicines you are taking, including over-the-counter medicines. Ask your health care provider about any side effects that may make you more likely to have a fall. Tell your health care provider if any medicines that you take make you feel dizzy or sleepy.  Osteoporosis screening. Osteoporosis is a condition that causes the bones to get weaker. This can make the bones weak and cause them to break more easily.  Blood pressure screening. Blood pressure changes and medicines to control blood pressure can make you feel dizzy.  Strength and balance checks. Your health care provider may recommend certain tests to check your strength and balance while standing, walking, or changing positions.  Foot health exam. Foot pain and numbness, as well as not wearing proper footwear, can make you more likely to have a fall.  Depression screening. You may be more likely to have a fall if you have a fear of falling, feel emotionally low, or feel unable to do activities that you used to do.  Alcohol use screening. Using too much alcohol can affect your balance and may make you more likely to have a fall. What actions can I take to lower my risk of falls? General instructions  Talk with your health care provider about your risks for falling. Tell your health care provider if: ? You fall. Be sure to tell your health care  provider about all falls, even ones that seem minor. ? You feel dizzy, sleepy, or off-balance.  Take over-the-counter and prescription medicines only as told by your health care provider. These include any supplements.  Eat a healthy diet and maintain a healthy weight. A healthy diet includes low-fat dairy products, low-fat (lean) meats, and fiber from whole grains, beans, and lots of fruits and vegetables. Home safety  Remove any tripping hazards, such as rugs, cords, and clutter.  Install safety equipment such as grab bars in bathrooms and safety rails on stairs.  Keep rooms and walkways well-lit. Activity   Follow a regular exercise program to stay fit. This will help you maintain your balance. Ask your health care provider what types of exercise are appropriate for you.  If you need a cane or walker, use it as recommended by your health care provider.  Wear supportive shoes that have nonskid soles. Lifestyle  Do not drink alcohol if your health care provider tells you not to drink.  If you drink alcohol, limit how much you have: ? 0-1 drink a day for women. ? 0-2 drinks a day for men.  Be aware of how much alcohol is in your drink. In the [place] one drink equals one typical bottle of beer (12 oz), one-half glass of wine (5  oz), or one shot of hard liquor (1 oz).  Do not use any products that contain nicotine or tobacco, such as cigarettes and e-cigarettes. If you need help quitting, ask your health care provider. Summary  Having a healthy lifestyle and getting preventive care can help to protect your health and wellness after age 77.  Screening and testing are the best way to find a health problem early and help you avoid having a fall. Early diagnosis and treatment give you the best chance for managing medical conditions that are more common for people who are older than age 26.  Falls are a major cause of broken bones and head injuries in people who are older than age 24.  Take precautions to prevent a fall at home.  Work with your health care provider to learn what changes you can make to improve your health and wellness and to prevent falls. This information is not intended to replace advice given to you by your health care provider. Make sure you discuss any questions you have with your health care provider. Document Revised: 10/30/2018 Document Reviewed: 05/22/2017 Elsevier Patient Education  2020 [employer].

## 2020-06-10 ENCOUNTER — Other Ambulatory Visit: Payer: Self-pay

## 2020-06-10 DIAGNOSIS — R7303 Prediabetes: Secondary | ICD-10-CM

## 2020-06-10 NOTE — Progress Notes (Signed)
ccm 

## 2020-06-13 NOTE — Progress Notes (Signed)
Subjective:    Patient ID: Chad Norris, male    DOB: 02-22-1932, 86 y.o.   MRN: [id number]  HPI He is here for a physical exam.   Some days he is as weak as he can be and some days he is ok.  He is not sure why.   He has lower back problems.  He has difficulty trying to tie his whoes.  If he rubs the back with biofreeze it really helps. It is nt bad enough that he needs to see someone now for it.    Medications and allergies reviewed with patient and updated if appropriate.  Patient Active Problem List   Diagnosis Date Noted  . Laceration of head 03/30/2020  . Allergic drug rash 12/28/2019  . Swelling of right wrist 06/11/2019  . Cervical radiculopathy 06/11/2019  . Sensorineural hearing loss, bilateral 05/21/2019  . Acute CVA (cerebrovascular accident) ([place]) 08/19/2018  . History of CVA (cerebrovascular accident) 08/18/2018  . Well adult exam 03/27/2018  . Hyperlipidemia 03/27/2018  . Basal cell carcinoma (BCC) of [place] 03/11/2017  . Prediabetes 03/11/2017  . DDD (degenerative disc disease), lumbosacral 01/13/2014  . PROSTATE CANCER, HX OF 06/21/2008  . COLONIC POLYPS, HX OF 06/21/2008  . DIVERTICULOSIS, COLON 05/07/2008  . NEPHROLITHIASIS, HX OF 05/07/2008  . History of gout 10/09/2007    Current Outpatient Medications on File Prior to Visit  Medication Sig Dispense Refill  . acetaminophen (TYLENOL) 500 MG tablet Take 1,000 mg by mouth every 6 (six) hours as needed (pain).    [name] atorvastatin (LIPITOR) 40 MG tablet TAKE 1 TABLET(40 MG) BY MOUTH DAILY AT 6 PM 90 tablet 1  . clopidogrel (PLAVIX) 75 MG tablet Take 1 tablet (75 mg total) by mouth daily. 90 tablet 3  . Multiple Vitamins-Minerals (ADULT ONE DAILY GUMMIES) CHEW Chew 2 tablets by mouth daily. men     No current facility-administered medications on file prior to visit.    Past Medical History:  Diagnosis Date  . Colon polyps   . Diverticulosis   . Gout   . Hemorrhoids   . Hyperlipemia   .  Nephrolithiasis   . Prostate cancer ([place])   . Stroke [facility])     Past Surgical History:  Procedure Laterality Date  . CATARACT EXTRACTION W/ INTRAOCULAR LENS  IMPLANT, BILATERAL     bi-lat  . CERVICAL SPINE SURGERY    . COLONOSCOPY W/ POLYPECTOMY    . Epidural steroid injections     3-4, Dr Ellene Route  . RETROPUBIC PROSTATECTOMY    . SHOULDER SURGERY     left    Social History   Socioeconomic History  . Marital status: Married    Spouse name: Not on file  . Number of children: 2  . Years of education: Not on file  . Highest education level: Not on file  Occupational History  . Occupation: retired  Tobacco Use  . Smoking status: Never Smoker  . Smokeless tobacco: Never Used  Substance and Sexual Activity  . Alcohol use: Yes    Alcohol/week: 1.0 standard drink    Types: 1 Cans of beer per week  . Drug use: No  . Sexual activity: Not on file  Other Topics Concern  . Not on file  Social History Narrative  . Not on file   Social Determinants of Health   Financial Resource Strain: Low Risk   . Difficulty of Paying Living Expenses: Not hard at all  Food Insecurity:   .  Worried About Charity fundraiser in the Last Year: Not on file  . Ran Out of Food in the Last Year: Not on file  Transportation Needs:   . Lack of Transportation (Medical): Not on file  . Lack of Transportation (Non-Medical): Not on file  Physical Activity:   . Days of Exercise per Week: Not on file  . Minutes of Exercise per Session: Not on file  Stress:   . Feeling of Stress : Not on file  Social Connections:   . Frequency of Communication with Friends and Family: Not on file  . Frequency of Social Gatherings with Friends and Family: Not on file  . Attends Religious Services: Not on file  . Active Member of Clubs or Organizations: Not on file  . Attends Archivist Meetings: Not on file  . Marital Status: Not on file    Family History  Problem Relation Age of Onset  . Kidney cancer  Father   . Colon cancer Father   . Cancer Brother        ?  [name] Heart attack Brother 48  . Heart attack Sister 47  . Breast cancer Sister   . Diabetes Brother   . Alcohol abuse Brother   . Colon polyps Brother     Review of Systems  Constitutional: Negative for chills and fever.  HENT: Positive for hearing loss.   Eyes: Negative for visual disturbance.  Respiratory: Negative for cough, shortness of breath and wheezing.   Cardiovascular: Negative for chest pain, palpitations and leg swelling.  Gastrointestinal: Negative for abdominal pain, blood in stool, constipation, diarrhea and nausea.       No gerd  Genitourinary: Negative for dysuria, hematuria and testicular pain.  Musculoskeletal: Positive for arthralgias (mild) and back pain (lower back).  Skin: Negative for color change and rash.  Neurological: Negative for dizziness, light-headedness and headaches.  Psychiatric/Behavioral: Positive for sleep disturbance (at times - naps during the day). Negative for dysphoric mood. The patient is not nervous/anxious.        Objective:   Vitals:   06/14/20 0752  BP: 114/72  Pulse: 68  Temp: 97.9 F (36.6 C)  SpO2: 97%   Filed Weights   06/14/20 0752  Weight: 198 lb 3.2 oz (89.9 kg)   Body mass index is 26.88 kg/m.  BP Readings from Last 3 Encounters:  06/14/20 114/72  03/31/20 120/70  03/26/20 139/79    Wt Readings from Last 3 Encounters:  06/14/20 198 lb 3.2 oz (89.9 kg)  03/31/20 198 lb 3.2 oz (89.9 kg)  03/25/20 195 lb (88.5 kg)     Physical Exam Constitutional: He appears well-developed and well-nourished. No distress.  HENT:  Head: Normocephalic and atraumatic.  Right Ear: External ear normal.  Left Ear: External ear normal.  Mouth/Throat: Oropharynx is clear and moist.  Normal ear canals and TM b/l  Eyes: Conjunctivae and EOM are normal.  Neck: Neck supple. No tracheal deviation present. No thyromegaly present.  No carotid bruit  Cardiovascular: Normal  rate, regular rhythm, normal heart sounds and intact distal pulses.   No murmur heard. Pulmonary/Chest: Effort normal and breath sounds normal. No respiratory distress. He has no wheezes. He has no rales.  Abdominal: Soft. He exhibits no distension. There is no tenderness.  Genitourinary: deferred  Musculoskeletal: He exhibits no edema.  Lymphadenopathy:   He has no cervical adenopathy.  Skin: Skin is warm and dry. He is not diaphoretic.  Psychiatric: He has a normal mood  and affect. His behavior is normal.         Assessment & Plan:   Physical exam: Screening blood work  ordered Immunizations  Discussed shingrix, others up to date Colonoscopy   N/a due to age Eye exams   Up to date  Exercise   He walks the dog twice a day Weight  Weight good for age Substance abuse   none  See Problem List for Assessment and Plan of chronic medical problems.   This visit occurred during the SARS-CoV-2 public health emergency.  Safety protocols were in place, including screening questions prior to the visit, additional usage of staff PPE, and extensive cleaning of exam room while observing appropriate contact time as indicated for disinfecting solutions.

## 2020-06-13 NOTE — Patient Instructions (Addendum)
Blood work was ordered.     No immunization administered today.   Medications changes include :   none   Please followup in 1 year    Health Maintenance, Male Adopting a healthy lifestyle and getting preventive care are important in promoting health and wellness. Ask your health care provider about:  The right schedule for you to have regular tests and exams.  Things you can do on your own to prevent diseases and keep yourself healthy. What should I know about diet, weight, and exercise? Eat a healthy diet   Eat a diet that includes plenty of vegetables, fruits, low-fat dairy products, and lean protein.  Do not eat a lot of foods that are high in solid fats, added sugars, or sodium. Maintain a healthy weight Body mass index (BMI) is a measurement that can be used to identify possible weight problems. It estimates body fat based on height and weight. Your health care provider can help determine your BMI and help you achieve or maintain a healthy weight. Get regular exercise Get regular exercise. This is one of the most important things you can do for your health. Most adults should:  Exercise for at least 150 minutes each week. The exercise should increase your heart rate and make you sweat (moderate-intensity exercise).  Do strengthening exercises at least twice a week. This is in addition to the moderate-intensity exercise.  Spend less time sitting. Even light physical activity can be beneficial. Watch cholesterol and blood lipids Have your blood tested for lipids and cholesterol at 84 years of age, then have this test every 5 years. You may need to have your cholesterol levels checked more often if:  Your lipid or cholesterol levels are high.  You are older than 84 years of age.  You are at high risk for heart disease. What should I know about cancer screening? Many types of cancers can be detected early and may often be prevented. Depending on your health history and  family history, you may need to have cancer screening at various ages. This may include screening for:  Colorectal cancer.  Prostate cancer.  Skin cancer.  Lung cancer. What should I know about heart disease, diabetes, and high blood pressure? Blood pressure and heart disease  High blood pressure causes heart disease and increases the risk of stroke. This is more likely to develop in people who have high blood pressure readings, are of African descent, or are overweight.  Talk with your health care provider about your target blood pressure readings.  Have your blood pressure checked: ? Every 3-5 years if you are 44-110 years of age. ? Every year if you are 42 years old or older.  If you are between the ages of 24 and 29 and are a current or former smoker, ask your health care provider if you should have a one-time screening for abdominal aortic aneurysm (AAA). Diabetes Have regular diabetes screenings. This checks your fasting blood sugar level. Have the screening done:  Once every three years after age 75 if you are at a normal weight and have a low risk for diabetes.  More often and at a younger age if you are overweight or have a high risk for diabetes. What should I know about preventing infection? Hepatitis B If you have a higher risk for hepatitis B, you should be screened for this virus. Talk with your health care provider to find out if you are at risk for hepatitis B infection. Hepatitis C Blood  testing is recommended for:  Everyone born from [birth year] through 1963.  Anyone with known risk factors for hepatitis C. Sexually transmitted infections (STIs)  You should be screened each year for STIs, including gonorrhea and chlamydia, if: ? You are sexually active and are younger than 84 years of age. ? You are older than 84 years of age and your health care provider tells you that you are at risk for this type of infection. ? Your sexual activity has changed since you were  last screened, and you are at increased risk for chlamydia or gonorrhea. Ask your health care provider if you are at risk.  Ask your health care provider about whether you are at high risk for HIV. Your health care provider may recommend a prescription medicine to help prevent HIV infection. If you choose to take medicine to prevent HIV, you should first get tested for HIV. You should then be tested every 3 months for as long as you are taking the medicine. Follow these instructions at home: Lifestyle  Do not use any products that contain nicotine or tobacco, such as cigarettes, e-cigarettes, and chewing tobacco. If you need help quitting, ask your health care provider.  Do not use street drugs.  Do not share needles.  Ask your health care provider for help if you need support or information about quitting drugs. Alcohol use  Do not drink alcohol if your health care provider tells you not to drink.  If you drink alcohol: ? Limit how much you have to 0-2 drinks a day. ? Be aware of how much alcohol is in your drink. In the [place] one drink equals one 12 oz bottle of beer (355 mL), one 5 oz glass of wine (148 mL), or one 1 oz glass of hard liquor (44 mL). General instructions  Schedule regular health, dental, and eye exams.  Stay current with your vaccines.  Tell your health care provider if: ? You often feel depressed. ? You have ever been abused or do not feel safe at home. Summary  Adopting a healthy lifestyle and getting preventive care are important in promoting health and wellness.  Follow your health care provider's instructions about healthy diet, exercising, and getting tested or screened for diseases.  Follow your health care provider's instructions on monitoring your cholesterol and blood pressure. This information is not intended to replace advice given to you by your health care provider. Make sure you discuss any questions you have with your health care  provider. Document Revised: 07/02/2018 Document Reviewed: 07/02/2018 Elsevier Patient Education  [place] or Strain Rehab Ask your health care provider which exercises are safe for you. Do exercises exactly as told by your health care provider and adjust them as directed. It is normal to feel mild stretching, pulling, tightness, or discomfort as you do these exercises. Stop right away if you feel sudden pain or your pain gets worse. Do not begin these exercises until told by your health care provider. Stretching and range-of-motion exercises These exercises warm up your muscles and joints and improve the movement and flexibility of your back. These exercises also help to relieve pain, numbness, and tingling. Lumbar rotation  1. Lie on your back on a firm surface and bend your knees. 2. Straighten your arms out to your sides so each arm forms a 90-degree angle (right angle) with a side of your body. 3. Slowly move (rotate) both of your knees to one side  of your body until you feel a stretch in your lower back (lumbar). Try not to let your shoulders lift off the floor. 4. Hold this position for __________ seconds. 5. Tense your abdominal muscles and slowly move your knees back to the starting position. 6. Repeat this exercise on the other side of your body. Repeat __________ times. Complete this exercise __________ times a day. Single knee to chest  1. Lie on your back on a firm surface with both legs straight. 2. Bend one of your knees. Use your hands to move your knee up toward your chest until you feel a gentle stretch in your lower back and buttock. ? Hold your leg in this position by holding on to the front of your knee. ? Keep your other leg as straight as possible. 3. Hold this position for __________ seconds. 4. Slowly return to the starting position. 5. Repeat with your other leg. Repeat __________ times. Complete this exercise __________ times a  day. Prone extension on elbows  1. Lie on your abdomen on a firm surface (prone position). 2. Prop yourself up on your elbows. 3. Use your arms to help lift your chest up until you feel a gentle stretch in your abdomen and your lower back. ? This will place some of your body weight on your elbows. If this is uncomfortable, try stacking pillows under your chest. ? Your hips should stay down, against the surface that you are lying on. Keep your hip and back muscles relaxed. 4. Hold this position for __________ seconds. 5. Slowly relax your upper body and return to the starting position. Repeat __________ times. Complete this exercise __________ times a day. Strengthening exercises These exercises build strength and endurance in your back. Endurance is the ability to use your muscles for a long time, even after they get tired. Pelvic tilt This exercise strengthens the muscles that lie deep in the abdomen. 1. Lie on your back on a firm surface. Bend your knees and keep your feet flat on the floor. 2. Tense your abdominal muscles. Tip your pelvis up toward the ceiling and flatten your lower back into the floor. ? To help with this exercise, you may place a small towel under your lower back and try to push your back into the towel. 3. Hold this position for __________ seconds. 4. Let your muscles relax completely before you repeat this exercise. Repeat __________ times. Complete this exercise __________ times a day. Alternating arm and leg raises  1. Get on your hands and knees on a firm surface. If you are on a hard floor, you may want to use padding, such as an exercise mat, to cushion your knees. 2. Line up your arms and legs. Your hands should be directly below your shoulders, and your knees should be directly below your hips. 3. Lift your left leg behind you. At the same time, raise your right arm and straighten it in front of you. ? Do not lift your leg higher than your hip. ? Do not lift  your arm higher than your shoulder. ? Keep your abdominal and back muscles tight. ? Keep your hips facing the ground. ? Do not arch your back. ? Keep your balance carefully, and do not hold your breath. 4. Hold this position for __________ seconds. 5. Slowly return to the starting position. 6. Repeat with your right leg and your left arm. Repeat __________ times. Complete this exercise __________ times a day. Abdominal set with straight leg raise  1. Lie on your back on a firm surface. 2. Bend one of your knees and keep your other leg straight. 3. Tense your abdominal muscles and lift your straight leg up, 4-6 inches (10-15 cm) off the ground. 4. Keep your abdominal muscles tight and hold this position for __________ seconds. ? Do not hold your breath. ? Do not arch your back. Keep it flat against the ground. 5. Keep your abdominal muscles tense as you slowly lower your leg back to the starting position. 6. Repeat with your other leg. Repeat __________ times. Complete this exercise __________ times a day. Single leg lower with bent knees 1. Lie on your back on a firm surface. 2. Tense your abdominal muscles and lift your feet off the floor, one foot at a time, so your knees and hips are bent in 90-degree angles (right angles). ? Your knees should be over your hips and your lower legs should be parallel to the floor. 3. Keeping your abdominal muscles tense and your knee bent, slowly lower one of your legs so your toe touches the ground. 4. Lift your leg back up to return to the starting position. ? Do not hold your breath. ? Do not let your back arch. Keep your back flat against the ground. 5. Repeat with your other leg. Repeat __________ times. Complete this exercise __________ times a day. Posture and body mechanics Good posture and healthy body mechanics can help to relieve stress in your body's tissues and joints. Body mechanics refers to the movements and positions of your body  while you do your daily activities. Posture is part of body mechanics. Good posture means:  Your spine is in its natural S-curve position (neutral).  Your shoulders are pulled back slightly.  Your head is not tipped forward. Follow these guidelines to improve your posture and body mechanics in your everyday activities. Standing   When standing, keep your spine neutral and your feet about hip width apart. Keep a slight bend in your knees. Your ears, shoulders, and hips should line up.  When you do a task in which you stand in one place for a long time, place one foot up on a stable object that is 2-4 inches (5-10 cm) high, such as a footstool. This helps keep your spine neutral. Sitting   When sitting, keep your spine neutral and keep your feet flat on the floor. Use a footrest, if necessary, and keep your thighs parallel to the floor. Avoid rounding your shoulders, and avoid tilting your head forward.  When working at a desk or a computer, keep your desk at a height where your hands are slightly lower than your elbows. Slide your chair under your desk so you are close enough to maintain good posture.  When working at a computer, place your monitor at a height where you are looking straight ahead and you do not have to tilt your head forward or downward to look at the screen. Resting  When lying down and resting, avoid positions that are most painful for you.  If you have pain with activities such as sitting, bending, stooping, or squatting, lie in a position in which your body does not bend very much. For example, avoid curling up on your side with your arms and knees near your chest (fetal position).  If you have pain with activities such as standing for a long time or reaching with your arms, lie with your spine in a neutral position and bend your knees slightly.  Try the following positions: ? Lying on your side with a pillow between your knees. ? Lying on your back with a pillow  under your knees. Lifting   When lifting objects, keep your feet at least shoulder width apart and tighten your abdominal muscles.  Bend your knees and hips and keep your spine neutral. It is important to lift using the strength of your legs, not your back. Do not lock your knees straight out.  Always ask for help to lift heavy or awkward objects. This information is not intended to replace advice given to you by your health care provider. Make sure you discuss any questions you have with your health care provider. Document Revised: 10/31/2018 Document Reviewed: 07/31/2018 Elsevier Patient Education  [place].

## 2020-06-14 ENCOUNTER — Other Ambulatory Visit: Payer: Self-pay

## 2020-06-14 ENCOUNTER — Encounter: Payer: Self-pay | Admitting: Internal Medicine

## 2020-06-14 ENCOUNTER — Ambulatory Visit (INDEPENDENT_AMBULATORY_CARE_PROVIDER_SITE_OTHER): Payer: Medicare Other | Admitting: Internal Medicine

## 2020-06-14 VITALS — BP 114/72 | HR 68 | Temp 97.9°F | Ht 72.0 in | Wt 198.2 lb

## 2020-06-14 DIAGNOSIS — Z8546 Personal history of malignant neoplasm of prostate: Secondary | ICD-10-CM | POA: Diagnosis not present

## 2020-06-14 DIAGNOSIS — R7303 Prediabetes: Secondary | ICD-10-CM

## 2020-06-14 DIAGNOSIS — Z8673 Personal history of transient ischemic attack (TIA), and cerebral infarction without residual deficits: Secondary | ICD-10-CM | POA: Diagnosis not present

## 2020-06-14 DIAGNOSIS — Z0000 Encounter for general adult medical examination without abnormal findings: Secondary | ICD-10-CM

## 2020-06-14 DIAGNOSIS — E7849 Other hyperlipidemia: Secondary | ICD-10-CM | POA: Diagnosis not present

## 2020-06-14 LAB — CBC WITH DIFFERENTIAL/PLATELET
Basophils Absolute: 0.1 10*3/uL (ref 0.0–0.1)
Basophils Relative: 1.1 % (ref 0.0–3.0)
Eosinophils Absolute: 0.1 10*3/uL (ref 0.0–0.7)
Eosinophils Relative: 2.4 % (ref 0.0–5.0)
HCT: 41 % (ref 39.0–52.0)
Hemoglobin: 14.1 g/dL (ref 13.0–17.0)
Lymphocytes Relative: 42.3 % (ref 12.0–46.0)
Lymphs Abs: 2 10*3/uL (ref 0.7–4.0)
MCHC: 34.3 g/dL (ref 30.0–36.0)
MCV: 92.9 fl (ref 78.0–100.0)
Monocytes Absolute: 0.7 10*3/uL (ref 0.1–1.0)
Monocytes Relative: 13.5 % — ABNORMAL HIGH (ref 3.0–12.0)
Neutro Abs: 2 10*3/uL (ref 1.4–7.7)
Neutrophils Relative %: 40.7 % — ABNORMAL LOW (ref 43.0–77.0)
Platelets: 165 10*3/uL (ref 150.0–400.0)
RBC: 4.42 Mil/uL (ref 4.22–5.81)
RDW: 12.8 % (ref 11.5–15.5)
WBC: 4.8 10*3/uL (ref 4.0–10.5)

## 2020-06-14 LAB — TSH: TSH: 2.25 u[IU]/mL (ref 0.35–4.50)

## 2020-06-14 LAB — COMPREHENSIVE METABOLIC PANEL
ALT: 14 U/L (ref 0–53)
AST: 17 U/L (ref 0–37)
Albumin: 4.4 g/dL (ref 3.5–5.2)
Alkaline Phosphatase: 53 U/L (ref 39–117)
BUN: 21 mg/dL (ref 6–23)
CO2: 27 mEq/L (ref 19–32)
Calcium: 9 mg/dL (ref 8.4–10.5)
Chloride: 105 mEq/L (ref 96–112)
Creatinine, Ser: 1.11 mg/dL (ref 0.40–1.50)
GFR: 60.14 mL/min (ref >60.00)
Glucose, Bld: 99 mg/dL (ref 70–99)
Potassium: 4.1 mEq/L (ref 3.5–5.1)
Sodium: 139 mEq/L (ref 135–145)
Total Bilirubin: 1 mg/dL (ref 0.2–1.2)
Total Protein: 7.3 g/dL (ref 6.0–8.3)

## 2020-06-14 LAB — LIPID PANEL
Cholesterol: 118 mg/dL (ref 0–200)
HDL: 38 mg/dL — ABNORMAL LOW (ref >39.00)
LDL Cholesterol: 62 mg/dL (ref 0–99)
NonHDL: 79.82
Total CHOL/HDL Ratio: 3
Triglycerides: 89 mg/dL (ref 0.0–149.0)
VLDL: 17.8 mg/dL (ref 0.0–40.0)

## 2020-06-14 LAB — HEMOGLOBIN A1C: Hgb A1c MFr Bld: 5.4 % (ref 4.6–6.5)

## 2020-06-14 NOTE — Assessment & Plan Note (Signed)
Chronic Check a1c Low sugar / carb diet Stressed regular exercise  

## 2020-06-14 NOTE — Assessment & Plan Note (Signed)
Sees urology annually No recurrence

## 2020-06-14 NOTE — Assessment & Plan Note (Signed)
Chronic No residual Continue plavix 75 mg daily, atorvastatin 40 mg daily BP well controlled Cmp, lipids

## 2020-06-14 NOTE — Assessment & Plan Note (Signed)
Chronic Check lipid panel  Continue atorvastatin 40 mg daily Regular exercise and healthy diet encouraged  

## 2020-08-18 DIAGNOSIS — R509 Fever, unspecified: Secondary | ICD-10-CM | POA: Diagnosis not present

## 2020-08-18 DIAGNOSIS — R6883 Chills (without fever): Secondary | ICD-10-CM | POA: Diagnosis not present

## 2020-08-18 DIAGNOSIS — R5381 Other malaise: Secondary | ICD-10-CM | POA: Diagnosis not present

## 2020-08-18 DIAGNOSIS — R051 Acute cough: Secondary | ICD-10-CM | POA: Diagnosis not present

## 2020-08-18 DIAGNOSIS — Z1152 Encounter for screening for COVID-19: Secondary | ICD-10-CM | POA: Diagnosis not present

## 2020-08-23 NOTE — Progress Notes (Signed)
Virtual Visit via Video Note  I connected with Chad Norris on 08/23/20 at  8:30 AM EST by a video enabled telemedicine application and verified that I am speaking with the correct person using two identifiers.   I discussed the limitations of evaluation and management by telemedicine and the availability of in person appointments. The patient expressed understanding and agreed to proceed.  Present for the visit:  Myself, Dr Billey Gosling, Chad Norris, his wife [name] and their neighbor Gene who is a palliative care nurse.  The patient is currently at home and I am in the office.    No referring provider.    History of Present Illness: This is an acute visit for pneumonia  He was not feeling well in the last Thursday, 6 days ago, went to an urgent care.  He had a rapid Covid test, PCR Covid test, flu test and they were all negative.  He was ruled out for UTI.  He did have a chest x-ray and was shown to have an infiltrate and was diagnosed with pneumonia.  He was experiencing pain in his right lung area and was febrile.  His temperature was 1/101.  At urgent care they gave him 2 Tylenol and 1 g IM of ceftriaxone.  They prescribed him a Z-Pak and he took the last pill 2 days ago.  He does feel much better.  He is still very tired and states he has no appetite.  He has not had any cough, wheeze or shortness of breath.  He admits he is not doing much walking though.  The right-sided lung pain has resolved.  While taking the antibiotics he did have some nausea.  That has resolved.  2 days ago, which is 5 days after starting antibiotics, he still had a low-grade fever and he also had a temperature of 100.4 last night.  His current vitals: Temperature 99.7, pulse 72, respiratory rate 26, 118/72,  98%     Review of Systems  Constitutional: Positive for fever (Last night, still with low-grade fever this morning) and malaise/fatigue. Negative for chills.       Decreased appetite  HENT: Negative for  congestion, ear pain and sore throat.   Respiratory: Negative for cough, shortness of breath and wheezing.   Cardiovascular: Negative for chest pain.  Gastrointestinal: Positive for nausea (With antibiotic only-now resolved).  Neurological: Negative for dizziness and headaches.      Social History   Socioeconomic History   Marital status: Married    Spouse name: Not on file   Number of children: 2   Years of education: Not on file   Highest education level: Not on file  Occupational History   Occupation: retired  Tobacco Use   Smoking status: Never Smoker   Smokeless tobacco: Never Used  Substance and Sexual Activity   Alcohol use: Yes    Alcohol/week: 1.0 standard drink    Types: 1 Cans of beer per week   Drug use: No   Sexual activity: Not on file  Other Topics Concern   Not on file  Social History Narrative   Not on file   Social Determinants of Health   Financial Resource Strain: Low Risk    Difficulty of Paying Living Expenses: Not hard at all  Food Insecurity: Not on file  Transportation Needs: Not on file  Physical Activity: Not on file  Stress: Not on file  Social Connections: Not on file     Observations/Objective: Appears well in NAD, but  does appear mildly ill-he appears fatigued and on occasion closes his eyes Breathing normally, does not appear tachypneic No coughing Skin appears warm and dry   Assessment and Plan:  See Problem List for Assessment and Plan of chronic medical problems.   Follow Up Instructions:    I discussed the assessment and treatment plan with the patient. The patient was provided an opportunity to ask questions and all were answered. The patient agreed with the plan and demonstrated an understanding of the instructions.   The patient was advised to call back or seek an in-person evaluation if the symptoms worsen or if the condition fails to improve as anticipated.    Binnie Rail, MD

## 2020-08-24 ENCOUNTER — Encounter: Payer: Self-pay | Admitting: Internal Medicine

## 2020-08-24 ENCOUNTER — Telehealth (INDEPENDENT_AMBULATORY_CARE_PROVIDER_SITE_OTHER): Payer: Medicare Other | Admitting: Internal Medicine

## 2020-08-24 DIAGNOSIS — J189 Pneumonia, unspecified organism: Secondary | ICD-10-CM | POA: Diagnosis not present

## 2020-08-24 MED ORDER — CEFPODOXIME PROXETIL 200 MG PO TABS: 200.0000 mg | ORAL_TABLET | Freq: Two times a day (BID) | ORAL | 0 refills | Status: AC

## 2020-08-24 NOTE — Assessment & Plan Note (Signed)
Acute Diagnosed at urgent care last Thursday via chest x-ray Rapid Covid, PCR Covid, flu all negative Received ceftriaxone 1 g IM x1 and 2 Tylenol while in urgent care and was prescribed a Z-Pak, which she has completed There has been a significant amount of improvement but clinically he is not successfully treated-still having a fever within 24 hours so I think he needs additional antibiotics Start Vantin 200 mg twice daily x10 days Discussed that his appetite and fatigue may take up to a few weeks to improve/resolve His neighbor, Gene will help monitor him clinically They will call with any questions or concerns or if his symptoms do not seem to be improving

## 2020-11-21 ENCOUNTER — Encounter: Payer: Self-pay | Admitting: Physician Assistant

## 2020-11-21 ENCOUNTER — Other Ambulatory Visit: Payer: Self-pay | Admitting: Physician Assistant

## 2020-11-21 DIAGNOSIS — U071 COVID-19: Secondary | ICD-10-CM

## 2020-11-21 DIAGNOSIS — C61 Malignant neoplasm of prostate: Secondary | ICD-10-CM | POA: Insufficient documentation

## 2020-11-21 DIAGNOSIS — E7849 Other hyperlipidemia: Secondary | ICD-10-CM

## 2020-11-21 DIAGNOSIS — I639 Cerebral infarction, unspecified: Secondary | ICD-10-CM | POA: Insufficient documentation

## 2020-11-21 DIAGNOSIS — N200 Calculus of kidney: Secondary | ICD-10-CM | POA: Insufficient documentation

## 2020-11-21 DIAGNOSIS — R7303 Prediabetes: Secondary | ICD-10-CM

## 2020-11-21 MED ORDER — MOLNUPIRAVIR EUA 200MG CAPSULE: 4.0000 | ORAL_CAPSULE | Freq: Two times a day (BID) | ORAL | 0 refills | Status: AC

## 2020-11-21 NOTE — Progress Notes (Signed)
Outpatient Oral COVID Treatment Note  I connected with Chad Norris on 11/21/2020/11:57 AM by telephone and verified that I am speaking with the correct person using two identifiers.  I discussed the limitations, risks, security, and privacy concerns of performing an evaluation and management service by telephone and the availability of in person appointments. I also discussed with the patient that there may be a patient responsible charge related to this service. The patient expressed understanding and agreed to proceed.  Patient location: home Provider location: office   Diagnosis: COVID-19 infection  Purpose of visit: Discussion of potential use of Molnupiravir or Paxlovid, a new treatment for mild to moderate COVID-19 viral infection in non-hospitalized patients.   Subjective: Patient is a 87 y.o. male who has been diagnosed with COVID 19 viral infection.  Their symptoms began on 4/28 with fatigue and cough and cold.    Past Medical History:  Diagnosis Date  . Colon polyps   . COVID-19   . Diverticulosis   . Gout   . Hemorrhoids   . Hyperlipemia   . Nephrolithiasis   . Prostate cancer ([place])   . Stroke [facility])     Allergies  Allergen Reactions  . Doxycycline     rash     Current Outpatient Medications:  .  acetaminophen (TYLENOL) 500 MG tablet, Take 1,000 mg by mouth every 6 (six) hours as needed (pain)., Disp: , Rfl:  .  atorvastatin (LIPITOR) 40 MG tablet, TAKE 1 TABLET(40 MG) BY MOUTH DAILY AT 6 PM, Disp: 90 tablet, Rfl: 1 .  clopidogrel (PLAVIX) 75 MG tablet, Take 1 tablet (75 mg total) by mouth daily., Disp: 90 tablet, Rfl: 3 .  Multiple Vitamins-Minerals (ADULT ONE DAILY GUMMIES) CHEW, Chew 2 tablets by mouth daily. men, Disp: , Rfl:   Objective: Patient sound congested.  They are in no apparent distress.  Breathing is non labored.  Mood and behavior are normal.  Laboratory Data:  No results found for this or any previous visit (from the past 2160 hour(s)).    Assessment: 87 y.o. male with mild/moderate COVID 19 viral infection diagnosed on 5/2 at high risk for progression to severe COVID 19.  Plan:  This patient is a 87 y.o. male that meets the following criteria for Emergency Use Authorization of: Molnupiravir  1. Age >18 yr 2. SARS-COV-2 positive test 3. Symptom onset < 5 days 4. Mild-to-moderate COVID disease with high risk for severe progression to hospitalization or death   I have spoken and communicated the following to the patient or parent/caregiver regarding: 1. Molnupiravir is an unapproved drug that is authorized for use under an [employer].  2. There are no adequate, approved, available products for the treatment of COVID-19 in adults who have mild-to-moderate COVID-19 and are at high risk for progressing to severe COVID-19, including hospitalization or death. 3. Other therapeutics are currently authorized. For additional information on all products authorized for treatment or prevention of COVID-19, please see [employer].  4. There are benefits and risks of taking this treatment as outlined in the "Fact Sheet for Patients and Caregivers."  5. "Fact Sheet for Patients and Caregivers" was reviewed with patient. A hard copy will be provided to patient from pharmacy prior to the patient receiving treatment. 6. Patients should continue to self-isolate and use infection control measures (e.g., wear mask, isolate, social distance, avoid sharing personal items, clean and disinfect "high touch" surfaces, and frequent handwashing) according to CDC guidelines.  7. The patient or parent/caregiver has the  option to accept or refuse treatment. 8. [place] has established a pregnancy surveillance program. 9. Females of childbearing potential should use a reliable method of contraception correctly and  consistently, as applicable, for the duration of treatment and for 4 days after the last dose of Molnupiravir. 15. Males of reproductive potential who are sexually active with females of childbearing potential should use a reliable method of contraception correctly and consistently during treatment and for at least 3 months after the last dose. 11. Pregnancy status and risk was assessed. Patient verbalized understanding of precautions.   After reviewing above information with the patient, the patient agrees to receive molnupiravir.  Follow up instructions:    . Take prescription BID x 5 days as directed . Reach out to pharmacist for counseling on medication if desired . For concerns regarding further COVID symptoms please follow up with your PCP or urgent care . For urgent or life-threatening issues, seek care at your local emergency department  The patient was provided an opportunity to ask questions, and all were answered. The patient agreed with the plan and demonstrated an understanding of the instructions.   Script sent to [facility] and opted to pick up RX.  The patient was advised to call their PCP or seek an in-person evaluation if the symptoms worsen or if the condition fails to improve as anticipated.   I provided 10 minutes of non face-to-face telephone visit time during this encounter, and > 50% was spent counseling as documented under my assessment & plan.  Angelena Form, PA-C 11/21/2020 /11:57 AM

## 2020-11-21 NOTE — Progress Notes (Signed)
I connected by phone with Chad Norris on 11/21/2020 at 12:10 PM to discuss the potential use of a new treatment for mild to moderate COVID-19 viral infection in non-hospitalized patients.  This patient is a 87 y.o. male that meets the FDA criteria for Emergency Use Authorization of COVID monoclonal antibody bebtelovimab.  Has a (+) direct SARS-CoV-2 viral test result  Has mild or moderate COVID-19   Is NOT hospitalized due to COVID-19  Is within 10 days of symptom onset  Has at least one of the high risk factor(s) for progression to severe COVID-19 and/or hospitalization as defined in EUA.  Specific high risk criteria : Older age (>/= 85 yo) and Cardiovascular disease or hypertension   I have spoken and communicated the following to the patient or parent/caregiver regarding COVID monoclonal antibody treatment:  1. FDA has authorized the emergency use for the treatment of mild to moderate COVID-19 in adults and pediatric patients with positive results of direct SARS-CoV-2 viral testing who are 93 years of age and older weighing at least 40 kg, and who are at high risk for progressing to severe COVID-19 and/or hospitalization.  2. The significant known and potential risks and benefits of COVID monoclonal antibody, and the extent to which such potential risks and benefits are unknown.  3. Information on available alternative treatments and the risks and benefits of those alternatives, including clinical trials.  4. Patients treated with COVID monoclonal antibody should continue to self-isolate and use infection control measures (e.g., wear mask, isolate, social distance, avoid sharing personal items, clean and disinfect "high touch" surfaces, and frequent handwashing) according to CDC guidelines.   5. The patient or parent/caregiver has the option to accept or refuse COVID monoclonal antibody treatment.  6. Discussion about the monoclonal antibody infusion does not ensure treatment. The  patient will be placed on a list and scheduled according to risk, symptom onset and availability. A scheduler will reach to the patient to let them know if we can accommodate their infusion or not.  After reviewing this information with the patient, the patient has agreed to receive one of the available covid 19 monoclonal antibodies and will be provided an appropriate fact sheet prior to infusion. Angelena Form, PA-C 11/21/2020 12:10 PM

## 2020-11-22 ENCOUNTER — Other Ambulatory Visit: Payer: Self-pay

## 2020-11-22 ENCOUNTER — Ambulatory Visit (INDEPENDENT_AMBULATORY_CARE_PROVIDER_SITE_OTHER): Payer: Medicare Other

## 2020-11-22 DIAGNOSIS — I639 Cerebral infarction, unspecified: Secondary | ICD-10-CM

## 2020-11-22 DIAGNOSIS — E7849 Other hyperlipidemia: Secondary | ICD-10-CM

## 2020-11-22 DIAGNOSIS — U071 COVID-19: Secondary | ICD-10-CM

## 2020-11-22 DIAGNOSIS — R7303 Prediabetes: Secondary | ICD-10-CM

## 2020-11-22 MED ORDER — SODIUM CHLORIDE 0.9 % IV SOLN: INTRAVENOUS | Status: DC | PRN

## 2020-11-22 MED ORDER — METHYLPREDNISOLONE SODIUM SUCC 125 MG IJ SOLR: 125.0000 mg | Freq: Once | INTRAMUSCULAR | Status: AC | PRN

## 2020-11-22 MED ORDER — ALBUTEROL SULFATE HFA 108 (90 BASE) MCG/ACT IN AERS: 2.0000 | INHALATION_SPRAY | Freq: Once | RESPIRATORY_TRACT | Status: AC | PRN

## 2020-11-22 MED ORDER — EPINEPHRINE 0.3 MG/0.3ML IJ SOAJ: 0.3000 mg | Freq: Once | INTRAMUSCULAR | Status: AC | PRN

## 2020-11-22 MED ORDER — DIPHENHYDRAMINE HCL 50 MG/ML IJ SOLN: 50.0000 mg | Freq: Once | INTRAMUSCULAR | Status: AC | PRN

## 2020-11-22 MED ORDER — FAMOTIDINE IN NACL 20-0.9 MG/50ML-% IV SOLN: 20.0000 mg | Freq: Once | INTRAVENOUS | Status: AC | PRN

## 2020-11-22 MED ORDER — BEBTELOVIMAB 175 MG/2 ML IV (EUA)
175.0000 mg | Freq: Once | INTRAMUSCULAR | Status: AC
  Administered 2020-11-22: 175 mg via INTRAVENOUS

## 2020-11-22 NOTE — Progress Notes (Signed)
Diagnosis: Covid  Provider:  Marshell Garfinkel, MD  Procedure: Infusion  IV Type: Peripheral, IV Location: L Antecubital  Bebtelovimab, Dose: 175mg   Infusion Start Time: 1424  Infusion Stop Time: 1425  Post Infusion IV Care: Observation period completed and Peripheral IV Discontinued  Discharge: Condition: Good, Destination: Home . AVS provided to patient.   Performed by:  Arnoldo Morale, RN

## 2020-12-16 ENCOUNTER — Other Ambulatory Visit: Payer: Self-pay | Admitting: Internal Medicine

## 2021-01-24 ENCOUNTER — Encounter: Payer: Self-pay | Admitting: Internal Medicine

## 2021-01-24 NOTE — Progress Notes (Signed)
Subjective:    Patient ID: Chad Norris, male    DOB: 12/28/1931, 87 y.o.   MRN: [id number]  HPI The patient is here for an acute visit.   Skin lesions about 3 months ago he noticed a lesion on his left medial lower leg and then he developed one higher up, then higher up, then 2 other ones higher up in the leg.  He then noticed a lesion on his left hand-that he popped and it bled a lot.  2 days ago he noticed a similar lesion on his right lower leg.  After about 3 weeks they resolved completely and leave no residual marks.  They are mildly itchy and he has put some cortisone cream on it.  They do not hurt.  He states they feel like there is something inside-a firmness.  Denies any exposures or bug bites.  Denies any systemic symptoms.  Was concerned about what the cause was.     Medications and allergies reviewed with patient and updated if appropriate.  Patient Active Problem List   Diagnosis Date Noted   Stroke [facility])    Prostate cancer ([place])    Nephrolithiasis    COVID-19    Pneumonia 08/24/2020   Laceration of head 03/30/2020   Swelling of right wrist 06/11/2019   Cervical radiculopathy 06/11/2019   Sensorineural hearing loss, bilateral 05/21/2019   Acute CVA (cerebrovascular accident) ([place]) 08/19/2018   History of CVA (cerebrovascular accident) 08/18/2018   Hyperlipidemia 03/27/2018   Basal cell carcinoma (BCC) of [place] 03/11/2017   Prediabetes 03/11/2017   DDD (degenerative disc disease), lumbosacral 01/13/2014   PROSTATE CANCER, HX OF 06/21/2008   COLONIC POLYPS, HX OF 06/21/2008   DIVERTICULOSIS, COLON 05/07/2008   NEPHROLITHIASIS, HX OF 05/07/2008   History of gout 10/09/2007    Current Outpatient Medications on File Prior to Visit  Medication Sig Dispense Refill   acetaminophen (TYLENOL) 500 MG tablet Take 1,000 mg by mouth every 6 (six) hours as needed (pain).     atorvastatin (LIPITOR) 40 MG tablet TAKE 1 TABLET(40 MG) BY MOUTH DAILY AT 6 PM 90 tablet 1    clopidogrel (PLAVIX) 75 MG tablet Take 1 tablet (75 mg total) by mouth daily. 90 tablet 3   Multiple Vitamins-Minerals (ADULT ONE DAILY GUMMIES) CHEW Chew 2 tablets by mouth daily. men     No current facility-administered medications on file prior to visit.    Past Medical History:  Diagnosis Date   Colon polyps    COVID-19    Diverticulosis    Gout    Hemorrhoids    Hyperlipemia    Nephrolithiasis    Prostate cancer ([place])    Stroke [facility])     Past Surgical History:  Procedure Laterality Date   CATARACT EXTRACTION W/ INTRAOCULAR LENS  IMPLANT, BILATERAL     bi-lat   CERVICAL SPINE SURGERY     COLONOSCOPY W/ POLYPECTOMY     Epidural steroid injections     3-4, Dr Ellene Route   RETROPUBIC PROSTATECTOMY     SHOULDER SURGERY     left    Social History   Socioeconomic History   Marital status: Married    Spouse name: Not on file   Number of children: 2   Years of education: Not on file   Highest education level: Not on file  Occupational History   Occupation: retired  Tobacco Use   Smoking status: Never   Smokeless tobacco: Never  Substance and Sexual Activity  Alcohol use: Yes    Alcohol/week: 1.0 standard drink    Types: 1 Cans of beer per week   Drug use: No   Sexual activity: Not on file  Other Topics Concern   Not on file  Social History Narrative   Not on file   Social Determinants of Health   Financial Resource Strain: Low Risk    Difficulty of Paying Living Expenses: Not hard at all  Food Insecurity: Not on file  Transportation Needs: Not on file  Physical Activity: Not on file  Stress: Not on file  Social Connections: Not on file    Family History  Problem Relation Age of Onset   Kidney cancer Father    Colon cancer Father    Cancer Brother        ?   Heart attack Brother 14   Heart attack Sister 73   Breast cancer Sister    Diabetes Brother    Alcohol abuse Brother    Colon polyps Brother     Review of Systems  Constitutional:   Negative for chills and fever.  Respiratory:  Negative for cough, shortness of breath and wheezing.   Cardiovascular:  Negative for chest pain, palpitations and leg swelling.  Gastrointestinal:  Negative for abdominal pain.  Musculoskeletal:  Positive for arthralgias. Negative for back pain.  Skin:        Skin lesions  Neurological:  Negative for light-headedness and headaches.      Objective:   Vitals:   01/25/21 1055  BP: 110/78  Pulse: 60  Temp: 98.3 F (36.8 C)  SpO2: 98%   BP Readings from Last 3 Encounters:  01/25/21 110/78  11/22/20 122/65  06/14/20 114/72   Wt Readings from Last 3 Encounters:  01/25/21 195 lb (88.5 kg)  06/14/20 198 lb 3.2 oz (89.9 kg)  03/31/20 198 lb 3.2 oz (89.9 kg)   Body mass index is 26.45 kg/m.   Physical Exam Constitutional:      General: He is not in acute distress.    Appearance: Normal appearance. He is not ill-appearing.  HENT:     Head: Normocephalic and atraumatic.  Musculoskeletal:     Right lower leg: No edema.     Left lower leg: No edema.  Skin:    General: Skin is warm and dry.     Findings: No erythema.     Comments: Nodular skin region right proximal lower leg and medial aspect-with slight induration and erythema surrounding-nontender, no fluctuance.  Couple areas of hyperpigmentation on left leg from previous lesions, popped blister on left hand from previous lesion all nontender  Neurological:     Mental Status: He is alert.           Assessment & Plan:    See Problem List for Assessment and Plan of chronic medical problems.    This visit occurred during the SARS-CoV-2 public health emergency.  Safety protocols were in place, including screening questions prior to the visit, additional usage of staff PPE, and extensive cleaning of exam room while observing appropriate contact time as indicated for disinfecting solutions.

## 2021-01-25 ENCOUNTER — Telehealth: Payer: Self-pay | Admitting: Internal Medicine

## 2021-01-25 ENCOUNTER — Other Ambulatory Visit: Payer: Self-pay

## 2021-01-25 ENCOUNTER — Ambulatory Visit (INDEPENDENT_AMBULATORY_CARE_PROVIDER_SITE_OTHER): Payer: Medicare Other | Admitting: Internal Medicine

## 2021-01-25 DIAGNOSIS — L989 Disorder of the skin and subcutaneous tissue, unspecified: Secondary | ICD-10-CM | POA: Diagnosis not present

## 2021-01-25 NOTE — Patient Instructions (Signed)
    See dermatology for your skin lesions.

## 2021-01-25 NOTE — Assessment & Plan Note (Signed)
Acute Somewhat nodular, itchy, no pain Initially left leg and now has a lesion on the right leg.  We both agree that the hand lesion could be something separate, but it is hard to tell at this time No obvious cause for pattern I am not sure what the cause of this is and advised him to see dermatology for further evaluation

## 2021-01-25 NOTE — Telephone Encounter (Signed)
Anderson Malta from [facility] has called wondering why the referral for the patient is being considered urgent.  also requesting the most recent office notes.   Please advise. P- [phone] ; [phone] F- [phone]

## 2021-01-25 NOTE — Telephone Encounter (Signed)
Referral is not urgent.  Patient called to make his own appointment.  Will finish note in the next 15 minutes

## 2021-01-26 NOTE — Telephone Encounter (Signed)
Faxed referral today for patient.

## 2021-01-26 NOTE — Addendum Note (Signed)
Addended by: Binnie Rail on: 01/26/2021 03:22 PM   Modules accepted: Orders

## 2021-01-26 NOTE — Telephone Encounter (Signed)
Ordered - if you need a copy of the referral it printed in my office

## 2021-02-08 DIAGNOSIS — Z85828 Personal history of other malignant neoplasm of skin: Secondary | ICD-10-CM | POA: Diagnosis not present

## 2021-02-08 DIAGNOSIS — L821 Other seborrheic keratosis: Secondary | ICD-10-CM | POA: Diagnosis not present

## 2021-02-08 DIAGNOSIS — R21 Rash and other nonspecific skin eruption: Secondary | ICD-10-CM | POA: Diagnosis not present

## 2021-02-08 DIAGNOSIS — L308 Other specified dermatitis: Secondary | ICD-10-CM | POA: Diagnosis not present

## 2021-02-08 DIAGNOSIS — D692 Other nonthrombocytopenic purpura: Secondary | ICD-10-CM | POA: Diagnosis not present

## 2021-02-08 DIAGNOSIS — L309 Dermatitis, unspecified: Secondary | ICD-10-CM | POA: Diagnosis not present

## 2021-03-01 ENCOUNTER — Ambulatory Visit
Admission: EM | Admit: 2021-03-01 | Discharge: 2021-03-01 | Disposition: A | Discharge status: Home / Self Care | Payer: Medicare Other | Attending: Emergency Medicine | Admitting: Emergency Medicine

## 2021-03-01 ENCOUNTER — Encounter: Payer: Self-pay | Admitting: Emergency Medicine

## 2021-03-01 ENCOUNTER — Other Ambulatory Visit: Payer: Self-pay

## 2021-03-01 DIAGNOSIS — R21 Rash and other nonspecific skin eruption: Secondary | ICD-10-CM

## 2021-03-01 MED ORDER — PREDNISONE 10 MG PO TABS: ORAL_TABLET | ORAL | 0 refills | Status: DC

## 2021-03-01 NOTE — ED Triage Notes (Addendum)
Patient c/o Rash on both lower legs x 2 months.   Patient endorses itchiness, "it's so itchy that I have difficulty sleeping at night".   Patient endorses increased redness with multiple areas of concern.   Patient has seen Dermatology and they have done a biopsy per patient statement.   Patient is unaware of having any contact with any thing that could have caused rash.   Patient has used Betamethasone dip 0.05% AUG Cream w/ no relief of symptoms.

## 2021-03-01 NOTE — Discharge Instructions (Addendum)
Begin prednisone taper x12 days Blood work pending-we will call with results Please follow-up with dermatology if persisting

## 2021-03-01 NOTE — ED Provider Notes (Signed)
[facility]    CSN: [id number] Arrival date & time: 03/01/21  0901      History   Chief Complaint Chief Complaint  Patient presents with   Rash    HPI Chad Norris is a 87 y.o. male history of prostate cancer, hyperlipidemia, presenting today for evaluation of a rash.  Entitle rash has been going on for approximately 2 months.  He was seen approximately 1 month ago by primary care for lesions on legs, recommended dermatology follow-up if persistent.  Referral was placed to [facility] dermatology.  Patient reports seeing dermatology and biopsy done and reports that biopsy was inconclusive.  Reports that he has had some relief with steroid creams, but not full relief.  He does express concern over possible brown recluse bite.  Denies any systemic symptoms of fever, dizziness, lightheadedness, headaches, vision changes, body aches, nausea or vomiting.  HPI  Past Medical History:  Diagnosis Date   Colon polyps    COVID-19    Diverticulosis    Gout    Hemorrhoids    Hyperlipemia    Nephrolithiasis    Prostate cancer ([place])    Stroke [facility])     Patient Active Problem List   Diagnosis Date Noted   Skin lesions 01/25/2021   Stroke [facility])    Prostate cancer ([place])    Nephrolithiasis    COVID-19    Pneumonia 08/24/2020   Laceration of head 03/30/2020   Swelling of right wrist 06/11/2019   Cervical radiculopathy 06/11/2019   Sensorineural hearing loss, bilateral 05/21/2019   Acute CVA (cerebrovascular accident) ([place]) 08/19/2018   History of CVA (cerebrovascular accident) 08/18/2018   Hyperlipidemia 03/27/2018   Basal cell carcinoma (BCC) of ala nasi 03/11/2017   Prediabetes 03/11/2017   DDD (degenerative disc disease), lumbosacral 01/13/2014   PROSTATE CANCER, HX OF 06/21/2008   COLONIC POLYPS, HX OF 06/21/2008   DIVERTICULOSIS, COLON 05/07/2008   NEPHROLITHIASIS, HX OF 05/07/2008   History of gout 10/09/2007    Past Surgical History:  Procedure Laterality Date    CATARACT EXTRACTION W/ INTRAOCULAR LENS  IMPLANT, BILATERAL     bi-lat   CERVICAL SPINE SURGERY     COLONOSCOPY W/ POLYPECTOMY     Epidural steroid injections     3-4, Dr Ellene Route   RETROPUBIC PROSTATECTOMY     SHOULDER SURGERY     left       Home Medications    Prior to Admission medications   Medication Sig Start Date End Date Taking? Authorizing Provider  atorvastatin (LIPITOR) 40 MG tablet TAKE 1 TABLET(40 MG) BY MOUTH DAILY AT 6 PM 12/16/20  Yes Burns, Claudina Lick, MD  predniSONE (DELTASONE) 10 MG tablet Begin with 6 tabs for 2 days, 5 tabs for 2 days, 4 tabs for 2 days, 3 tabs for 2 days, 2 tabs for 2 days, 1 tab for 2 days-take with food and earlier in the day if possible 03/01/21  Yes Ivionna Verley C, PA-C  acetaminophen (TYLENOL) 500 MG tablet Take 1,000 mg by mouth every 6 (six) hours as needed (pain).    [provider]  clopidogrel (PLAVIX) 75 MG tablet Take 1 tablet (75 mg total) by mouth daily. 04/09/19   Frann Rider, NP  Multiple Vitamins-Minerals (ADULT ONE DAILY GUMMIES) CHEW Chew 2 tablets by mouth daily. men    [provider]    Family History Family History  Problem Relation Age of Onset   Kidney cancer Father    Colon cancer Father  Cancer Brother        ?   Heart attack Brother 61   Heart attack Sister 12   Breast cancer Sister    Diabetes Brother    Alcohol abuse Brother    Colon polyps Brother     Social History Social History   Tobacco Use   Smoking status: Never   Smokeless tobacco: Never  Substance Use Topics   Alcohol use: Yes    Alcohol/week: 1.0 standard drink    Types: 1 Cans of beer per week   Drug use: No     Allergies   Doxycycline   Review of Systems Review of Systems  Constitutional:  Negative for fatigue and fever.  Eyes:  Negative for redness, itching and visual disturbance.  Respiratory:  Negative for shortness of breath.   Cardiovascular:  Negative for chest pain and leg swelling.   Gastrointestinal:  Negative for nausea and vomiting.  Musculoskeletal:  Negative for arthralgias and myalgias.  Skin:  Positive for color change and rash. Negative for wound.  Neurological:  Negative for dizziness, syncope, weakness, light-headedness and headaches.    Physical Exam Triage Vital Signs ED Triage Vitals  Enc Vitals Group     BP      Pulse      Resp      Temp      Temp src      SpO2      Weight      Height      Head Circumference      Peak Flow      Pain Score      Pain Loc      Pain Edu?      Excl. in [place]?    No data found.  Updated Vital Signs BP 140/78 (BP Location: Right Arm)   Pulse (!) 54   Temp 97.8 F (36.6 C) (Oral)   Resp 16   SpO2 97%   Visual Acuity Right Eye Distance:   Left Eye Distance:   Bilateral Distance:    Right Eye Near:   Left Eye Near:    Bilateral Near:     Physical Exam Vitals and nursing note reviewed.  Constitutional:      Appearance: He is well-developed.     Comments: No acute distress  HENT:     Head: Normocephalic and atraumatic.     Nose: Nose normal.  Eyes:     Conjunctiva/sclera: Conjunctivae normal.  Cardiovascular:     Rate and Rhythm: Normal rate.  Pulmonary:     Effort: Pulmonary effort is normal. No respiratory distress.  Abdominal:     General: There is no distension.  Musculoskeletal:        General: Normal range of motion.     Cervical back: Neck supple.  Skin:    General: Skin is warm and dry.     Comments: Bilateral lower extremities with raised areas of erythema, shows irregular patches of erythema to legs on phone photos  Neurological:     Mental Status: He is alert and oriented to person, place, and time.     UC Treatments / Results  Labs (all labs ordered are listed, but only abnormal results are displayed) Labs Reviewed  CBC WITH DIFFERENTIAL/PLATELET  COMPREHENSIVE METABOLIC PANEL  LYME DISEASE SEROLOGY W/REFLEX  ROCKY MTN SPOTTED FVR ABS PNL(IGG+IGM)     EKG   Radiology No results found.  Procedures Procedures (including critical care time)  Medications Ordered in UC Medications - No  data to display  Initial Impression / Assessment and Plan / UC Course  I have reviewed the triage vital signs and the nursing notes.  Pertinent labs & imaging results that were available during my care of the patient were reviewed by me and considered in my medical decision making (see chart for details).     Rash lesions similar in nature to insect bites, no vesicles, blanchable, no petechiae noted, given persistent symptoms, prior biopsy, will proceed with basic blood work and screen for tickborne illness with atypical presentation.  Discussed with patient lower suspicion of brown recluse given lack of systemic symptoms.  Trial of prednisone taper x12 days and encourage follow-up with dermatology if persisting.  Discussed strict return precautions. Patient verbalized understanding and is agreeable with plan.  Final Clinical Impressions(s) / UC Diagnoses   Final diagnoses:  Rash and nonspecific skin eruption     Discharge Instructions      Begin prednisone taper x12 days Blood work pending-we will call with results Please follow-up with dermatology if persisting     ED Prescriptions     Medication Sig Dispense Auth. Provider   predniSONE (DELTASONE) 10 MG tablet Begin with 6 tabs for 2 days, 5 tabs for 2 days, 4 tabs for 2 days, 3 tabs for 2 days, 2 tabs for 2 days, 1 tab for 2 days-take with food and earlier in the day if possible 42 tablet Adolph Clutter C, PA-C      PDMP not reviewed this encounter.   Cyntha Brickman, [place] C, PA-C 03/01/21 1050

## 2021-03-05 LAB — COMPREHENSIVE METABOLIC PANEL
ALT: 12 IU/L (ref 0–44)
AST: 17 IU/L (ref 0–40)
Albumin/Globulin Ratio: 2.2 (ref 1.2–2.2)
Albumin: 4.8 g/dL — ABNORMAL HIGH (ref 3.6–4.6)
Alkaline Phosphatase: 50 IU/L (ref 44–121)
BUN/Creatinine Ratio: 19 (ref 10–24)
BUN: 20 mg/dL (ref 8–27)
Bilirubin Total: 1.1 mg/dL (ref 0.0–1.2)
CO2: 22 mmol/L (ref 20–29)
Calcium: 9.1 mg/dL (ref 8.6–10.2)
Chloride: 102 mmol/L (ref 96–106)
Creatinine, Ser: 1.03 mg/dL (ref 0.76–1.27)
Globulin, Total: 2.2 g/dL (ref 1.5–4.5)
Glucose: 93 mg/dL (ref 65–99)
Potassium: 4.3 mmol/L (ref 3.5–5.2)
Sodium: 139 mmol/L (ref 134–144)
Total Protein: 7 g/dL (ref 6.0–8.5)
eGFR: 70 mL/min/{1.73_m2} (ref >59)

## 2021-03-05 LAB — CBC WITH DIFFERENTIAL/PLATELET
Basophils Absolute: 0 10*3/uL (ref 0.0–0.2)
Basos: 1 %
EOS (ABSOLUTE): 0.1 10*3/uL (ref 0.0–0.4)
Eos: 3 %
Hematocrit: 37.4 % — ABNORMAL LOW (ref 37.5–51.0)
Hemoglobin: 12.5 g/dL — ABNORMAL LOW (ref 13.0–17.7)
Immature Grans (Abs): 0 10*3/uL (ref 0.0–0.1)
Immature Granulocytes: 0 %
Lymphocytes Absolute: 2.1 10*3/uL (ref 0.7–3.1)
Lymphs: 46 %
MCH: 32.1 pg (ref 26.6–33.0)
MCHC: 33.4 g/dL (ref 31.5–35.7)
MCV: 96 fL (ref 79–97)
Monocytes Absolute: 0.6 10*3/uL (ref 0.1–0.9)
Monocytes: 12 %
Neutrophils Absolute: 1.7 10*3/uL (ref 1.4–7.0)
Neutrophils: 38 %
RBC: 3.89 x10E6/uL — ABNORMAL LOW (ref 4.14–5.80)
RDW: 13.1 % (ref 11.6–15.4)
WBC: 4.6 10*3/uL (ref 3.4–10.8)

## 2021-03-05 LAB — ROCKY MTN SPOTTED FVR ABS PNL(IGG+IGM)
RMSF IgG: NEGATIVE
RMSF IgM: 0.38 index (ref 0.00–0.89)

## 2021-03-05 LAB — LYME DISEASE SEROLOGY W/REFLEX: Lyme Total Antibody EIA: NEGATIVE

## 2021-04-07 ENCOUNTER — Ambulatory Visit (INDEPENDENT_AMBULATORY_CARE_PROVIDER_SITE_OTHER): Payer: Medicare Other

## 2021-04-07 ENCOUNTER — Other Ambulatory Visit: Payer: Self-pay

## 2021-04-07 DIAGNOSIS — Z23 Encounter for immunization: Secondary | ICD-10-CM | POA: Diagnosis not present

## 2021-04-07 NOTE — Progress Notes (Signed)
High Dose Influenza Vacc given w/o any complications.

## 2021-04-10 DIAGNOSIS — Z961 Presence of intraocular lens: Secondary | ICD-10-CM | POA: Diagnosis not present

## 2021-04-13 DIAGNOSIS — Z8673 Personal history of transient ischemic attack (TIA), and cerebral infarction without residual deficits: Secondary | ICD-10-CM | POA: Diagnosis not present

## 2021-04-13 DIAGNOSIS — T675XXA Heat exhaustion, unspecified, initial encounter: Secondary | ICD-10-CM | POA: Diagnosis not present

## 2021-05-15 ENCOUNTER — Other Ambulatory Visit: Payer: Self-pay

## 2021-05-15 ENCOUNTER — Ambulatory Visit (INDEPENDENT_AMBULATORY_CARE_PROVIDER_SITE_OTHER): Payer: Medicare Other | Admitting: Pharmacist

## 2021-05-15 DIAGNOSIS — E7849 Other hyperlipidemia: Secondary | ICD-10-CM

## 2021-05-15 DIAGNOSIS — Z8673 Personal history of transient ischemic attack (TIA), and cerebral infarction without residual deficits: Secondary | ICD-10-CM

## 2021-05-15 MED ORDER — ASPIRIN 81 MG PO TBEC: 81.0000 mg | DELAYED_RELEASE_TABLET | Freq: Every day | ORAL | 12 refills | Status: DC

## 2021-05-15 MED ORDER — ATORVASTATIN CALCIUM 40 MG PO TABS: ORAL_TABLET | ORAL | 0 refills | Status: DC

## 2021-05-15 NOTE — Patient Instructions (Signed)
Visit Information  Phone number for Pharmacist: [phone]   Goals Addressed             This Visit's Progress    Manage My Medicine       Timeframe:  Long-Range Goal Priority:  High Start Date:    05/15/21                         Expected End Date:      05/15/22                 Follow Up Date April 2023   - call for medicine refill 2 or 3 days before it runs out - call if I am sick and can't take my medicine - keep a list of all the medicines I take; vitamins and herbals too  -Contact provider if there is trouble with refills in future -Restart atorvastatin 40 mg daily -Start aspirin 81 mg daily   Why is this important?   These steps will help you keep on track with your medicines.   Notes:         Care Plan : [place]  Updates made by Charlton Haws, RPH since 05/15/2021 12:00 AM     Problem: Hyperlipidemia, Hx stroke   Priority: High     Long-Range Goal: Disease management   Start Date: 05/15/2021  Expected End Date: 05/15/2022  This Visit's Progress: On track  Priority: High  Note:   Current Barriers:  Unable to independently monitor therapeutic efficacy Does not adhere to prescribed medication regimen  Pharmacist Clinical Goal(s):  Patient will achieve adherence to monitoring guidelines and medication adherence to achieve therapeutic efficacy adhere to prescribed medication regimen as evidenced by prescription refill dates through collaboration with PharmD and provider.   Interventions: 1:1 collaboration with Binnie Rail, MD regarding development and update of comprehensive plan of care as evidenced by provider attestation and co-signature Inter-disciplinary care team collaboration (see longitudinal plan of care) Comprehensive medication review performed; medication list updated in electronic medical record  Hyperlipidemia / CVA    LDL goal < 70 Hx stroke (07/2018)  Patient has failed these meds in past: n/a Patient is  currently controlled on the following medications:  Atorvastatin 40 mg daily - not taking, needs refill Clopidogrel 75 mg daily - not taken in several months   We discussed: importance of compliance with atorvastatin; discussed need for antiplatelet after having a stroke; it has been over 2 years since his stroke so it would be reasonable to use aspirin over clopidogrel   Plan: Start aspirin 81 mg daily (clopidogrel removed from med list) Refill atorvastatin 40 mg   Pain    Degenerative disc disease, lumbosacral Cervical radiculopathy   Patient has failed these meds in past: n/a Patient is currently controlled on the following medications:  Tylenol 500 mg PRN   We discussed: Pt reports he uses Tylenol occasionally; Patient is satisfied with current regimen and denies issues   Plan: Continue current medications    Patient Goals/Self-Care Activities Patient will:  - take medications as prescribed focus on medication adherence by routine -Contact provider if there is trouble with refills in future -Restart atorvastatin 40 mg daily -Start aspirin 81 mg daily      Patient verbalizes understanding of instructions provided today and agrees to view in [place].  Telephone follow up appointment with pharmacy team member scheduled for: 1 year  [name], PharmD, [place], CPP Clinical  Pharmacist [facility] [phone]

## 2021-05-15 NOTE — Progress Notes (Signed)
Chronic Care Management Pharmacy Note  05/15/2021 Name:  Tyrail Grandfield MRN:  [id number] DOB:  12/24/1931  Summary: -Pt reports he has been out of clopidogrel for several months, he was unable to refill it (per chart, Rx would have run out 03/2020) -Pt is not taking atorvastatin and needs a refill  Recommendations/Changes made from today's visit: -Refilled atorvastatin -Start aspirin 81 mg daily. Removed clopidogrel from med list.   Subjective: Jaivion Kingsley is an 87 y.o. year old male who is a primary patient of Burns, Claudina Lick, MD.  The CCM team was consulted for assistance with disease management and care coordination needs.    Engaged with patient by telephone for follow up visit in response to provider referral for pharmacy case management and/or care coordination services.   Consent to Services:  The patient was given information about Chronic Care Management services, agreed to services, and gave verbal consent prior to initiation of services.  Please see initial visit note for detailed documentation.   Patient Care Team: Binnie Rail, MD as PCP - General (Internal Medicine) Charlton Haws, [facility] as Pharmacist (Pharmacist)  Recent office visits: 01/25/21 Dr Quay Burow OV: skin lesion. Advised to see dermatology  Recent consult visits: 03/01/21 Urgent care - rash. Rx'd prednisone taper   Hospital visits: Medication Reconciliation was completed by comparing discharge summary, patient's EMR and Pharmacy list, and upon discussion with patient.  Admitted to the ED on 04/13/21 due to heat exhaustion. Discharge date was 04/13/21. Discharged from [facility].   -given fluids  Medications that remain the same after Hospital Discharge:??  -All other medications will remain the same.     Objective:  Lab Results  Component Value Date   CREATININE 1.03 03/01/2021   BUN 20 03/01/2021   GFR 60.14 06/14/2020   GFRNONAA 58 (L) 08/19/2018   GFRAA >60 08/19/2018    NA 139 03/01/2021   K 4.3 03/01/2021   CALCIUM 9.1 03/01/2021   CO2 22 03/01/2021   GLUCOSE 93 03/01/2021    Lab Results  Component Value Date/Time   HGBA1C 5.4 06/14/2020 08:34 AM   HGBA1C 5.8 (H) 08/19/2018 02:12 AM   GFR 60.14 06/14/2020 08:34 AM   GFR 67.80 06/11/2019 12:03 PM    Last diabetic Eye exam: No results found for: HMDIABEYEEXA  Last diabetic Foot exam: No results found for: HMDIABFOOTEX   Lab Results  Component Value Date   CHOL 118 06/14/2020   HDL 38.00 (L) 06/14/2020   LDLCALC 62 06/14/2020   LDLDIRECT 122.0 03/27/2018   TRIG 89.0 06/14/2020   CHOLHDL 3 06/14/2020    Hepatic Function Latest Ref Rng & Units 03/01/2021 06/14/2020 06/11/2019  Total Protein 6.0 - 8.5 g/dL 7.0 7.3 8.0  Albumin 3.6 - 4.6 g/dL 4.8(H) 4.4 4.6  AST 0 - 40 IU/L '17 17 17  ' ALT 0 - 44 IU/L '12 14 15  ' Alk Phosphatase 44 - 121 IU/L 50 53 57  Total Bilirubin 0.0 - 1.2 mg/dL 1.1 1.0 0.9  Bilirubin, Direct 0.0 - 0.3 mg/dL - - -    Lab Results  Component Value Date/Time   TSH 2.25 06/14/2020 08:34 AM   TSH 1.95 03/27/2018 08:52 AM    CBC Latest Ref Rng & Units 03/01/2021 06/14/2020 06/11/2019  WBC 3.4 - 10.8 x10E3/uL 4.6 4.8 7.3  Hemoglobin 13.0 - 17.7 g/dL 12.5(L) 14.1 14.6  Hematocrit 37.5 - 51.0 % 37.4(L) 41.0 43.0  Platelets x10E3/uL CANCELED 165.0 221.0    Lab Results  Component Value Date/Time   VD25OH 28.50 (L) 03/27/2018 08:52 AM    Clinical ASCVD: Yes  The ASCVD Risk score (Arnett DK, et al., 2019) failed to calculate for the following reasons:   The 2019 ASCVD risk score is only valid for ages 68 to 12   The patient has a prior MI or stroke diagnosis    Depression screen [facility] 2/9 01/25/2021 12/10/2019 10/02/2018  Decreased Interest 0 0 0  Down, Depressed, Hopeless 0 0 0  PHQ - 2 Score 0 0 0     Social History   Tobacco Use  Smoking Status Never  Smokeless Tobacco Never   BP Readings from Last 3 Encounters:  03/01/21 140/78  01/25/21 110/78  11/22/20  122/65   Pulse Readings from Last 3 Encounters:  03/01/21 (!) 54  01/25/21 60  11/22/20 (!) 49   Wt Readings from Last 3 Encounters:  01/25/21 195 lb (88.5 kg)  06/14/20 198 lb 3.2 oz (89.9 kg)  03/31/20 198 lb 3.2 oz (89.9 kg)   BMI Readings from Last 3 Encounters:  01/25/21 26.45 kg/m  06/14/20 26.88 kg/m  03/31/20 26.88 kg/m    Assessment/Interventions: Review of patient past medical history, allergies, medications, health status, including review of consultants reports, laboratory and other test data, was performed as part of comprehensive evaluation and provision of chronic care management services.   SDOH:  (Social Determinants of Health) assessments and interventions performed: Yes  SDOH Screenings   Alcohol Screen: Not on file  Depression (PHQ2-9): Low Risk    PHQ-2 Score: 0  Financial Resource Strain: Low Risk    Difficulty of Paying Living Expenses: Not hard at all  Food Insecurity: Not on file  Housing: Not on file  Physical Activity: Not on file  Social Connections: Not on file  Stress: Not on file  Tobacco Use: Low Risk    Smoking Tobacco Use: Never   Smokeless Tobacco Use: Never   Passive Exposure: Not on file  Transportation Needs: Not on file    [place]  Allergies  Allergen Reactions   Doxycycline     rash    Medications Reviewed Today     Reviewed by Charlton Haws, [facility] (Pharmacist) on 05/15/21 at 1320  Med List Status: <None>   Medication Order Taking? Sig Documenting Provider Last Dose Status Informant  acetaminophen (TYLENOL) 500 MG tablet [id number] Yes Take 1,000 mg by mouth every 6 (six) hours as needed (pain). [provider] Taking Active Spouse/Significant Other  aspirin 81 MG EC tablet [id number] No Take 1 tablet (81 mg total) by mouth daily. Swallow whole.  Patient not taking: Reported on 05/15/2021   Binnie Rail, MD Not Taking Active   atorvastatin (LIPITOR) 40 MG tablet [id number] No TAKE 1 TABLET(40 MG)  BY MOUTH DAILY AT 6 PM  Patient not taking: Reported on 05/15/2021   Binnie Rail, MD Not Taking Active   Multiple Vitamins-Minerals (ADULT ONE DAILY GUMMIES) CHEW [id number] Yes Chew 2 tablets by mouth daily. men [provider] Taking Active Spouse/Significant Other            Patient Active Problem List   Diagnosis Date Noted   Skin lesions 01/25/2021   Stroke [facility])    Prostate cancer ([place])    Nephrolithiasis    COVID-19    Pneumonia 08/24/2020   Laceration of head 03/30/2020   Swelling of right wrist 06/11/2019   Cervical radiculopathy 06/11/2019   Sensorineural hearing loss, bilateral 05/21/2019  Acute CVA (cerebrovascular accident) ([place]) 08/19/2018   History of CVA (cerebrovascular accident) 08/18/2018   Hyperlipidemia 03/27/2018   Basal cell carcinoma (BCC) of ala nasi 03/11/2017   Prediabetes 03/11/2017   DDD (degenerative disc disease), lumbosacral 01/13/2014   PROSTATE CANCER, HX OF 06/21/2008   COLONIC POLYPS, HX OF 06/21/2008   DIVERTICULOSIS, COLON 05/07/2008   NEPHROLITHIASIS, HX OF 05/07/2008   History of gout 10/09/2007    Immunization History  Administered Date(s) Administered   Fluad Quad(high Dose 65+) 04/01/2019, 04/21/2020, 04/07/2021   H1N1 07/28/2008   Influenza Split 05/08/2012   Influenza Whole 04/19/2010   Influenza, High Dose Seasonal PF 05/12/2013, 04/05/2017, 04/24/2018   Influenza,inj,Quad PF,6+ Mos 04/21/2014, 04/22/2015   PFIZER(Purple Top)SARS-COV-2 Vaccination 08/01/2019, 08/22/2019, 03/25/2020, 03/29/2021   Pneumococcal Conjugate-13 05/13/2015   Pneumococcal Polysaccharide-23 03/11/2017   Tdap 06/11/2019, 03/26/2020    Conditions to be addressed/monitored:  Hyperlipidemia, Hx stroke  Care Plan : [place]  Updates made by Charlton Haws, [place] since 05/15/2021 12:00 AM     Problem: Hyperlipidemia, Hx stroke   Priority: High     Long-Range Goal: Disease management   Start Date: 05/15/2021   Expected End Date: 05/15/2022  This Visit's Progress: On track  Priority: High  Note:   Current Barriers:  Unable to independently monitor therapeutic efficacy Does not adhere to prescribed medication regimen  Pharmacist Clinical Goal(s):  Patient will achieve adherence to monitoring guidelines and medication adherence to achieve therapeutic efficacy adhere to prescribed medication regimen as evidenced by prescription refill dates through collaboration with PharmD and provider.   Interventions: 1:1 collaboration with Binnie Rail, MD regarding development and update of comprehensive plan of care as evidenced by provider attestation and co-signature Inter-disciplinary care team collaboration (see longitudinal plan of care) Comprehensive medication review performed; medication list updated in electronic medical record  Hyperlipidemia / CVA    LDL goal < 70 Hx stroke (07/2018)  Patient has failed these meds in past: n/a Patient is currently controlled on the following medications:  Atorvastatin 40 mg daily - not taking, needs refill Clopidogrel 75 mg daily - not taken in several months   We discussed: importance of compliance with atorvastatin; discussed need for antiplatelet after having a stroke; it has been over 2 years since his stroke so it would be reasonable to use aspirin over clopidogrel   Plan: Start aspirin 81 mg daily (clopidogrel removed from med list) Refill atorvastatin 40 mg   Pain    Degenerative disc disease, lumbosacral Cervical radiculopathy   Patient has failed these meds in past: n/a Patient is currently controlled on the following medications:  Tylenol 500 mg PRN   We discussed: Pt reports he uses Tylenol occasionally; Patient is satisfied with current regimen and denies issues   Plan: Continue current medications    Patient Goals/Self-Care Activities Patient will:  - take medications as prescribed focus on medication adherence by  routine -Contact provider if there is trouble with refills in future -Restart atorvastatin 40 mg daily -Start aspirin 81 mg daily      Medication Assistance: None required.  Patient affirms current coverage meets needs.  Compliance/Adherence/Medication fill history: Care Gaps: None  Star-Rating Drugs: Atorvastatin - LF 12/16/20 x 90 ds  Patient's preferred pharmacy is:  [id number] [address], [address] AT [facility] OF [address] [place] [address] Phone: [phone] Fax: [phone]  Uses pill box? No - prefers bottles Pt  endorses 50% compliance  We discussed: Current pharmacy is preferred with insurance plan and patient is satisfied with pharmacy services Patient decided to: Continue current medication management strategy  Care Plan and Follow Up Patient Decision:  Patient agrees to Care Plan and Follow-up.  Plan: Telephone follow up appointment with care management team member scheduled for:  1 year  [name], PharmD, [place], CPP Clinical Pharmacist [facility] [phone]

## 2021-05-22 DIAGNOSIS — E7849 Other hyperlipidemia: Secondary | ICD-10-CM

## 2021-06-04 NOTE — Patient Instructions (Addendum)
Blood work was ordered.     Medications changes include :   Myrbetriq 25 mg daily for your bladder.  If this helps we can consider a higher dose.    Your prescription(s) have been submitted to your pharmacy. Please take as directed and contact our office if you believe you are having problem(s) with the medication(s).   Please followup in 1 year   Health Maintenance, Male Adopting a healthy lifestyle and getting preventive care are important in promoting health and wellness. Ask your health care provider about: The right schedule for you to have regular tests and exams. Things you can do on your own to prevent diseases and keep yourself healthy. What should I know about diet, weight, and exercise? Eat a healthy diet  Eat a diet that includes plenty of vegetables, fruits, low-fat dairy products, and lean protein. Do not eat a lot of foods that are high in solid fats, added sugars, or sodium. Maintain a healthy weight Body mass index (BMI) is a measurement that can be used to identify possible weight problems. It estimates body fat based on height and weight. Your health care provider can help determine your BMI and help you achieve or maintain a healthy weight. Get regular exercise Get regular exercise. This is one of the most important things you can do for your health. Most adults should: Exercise for at least 150 minutes each week. The exercise should increase your heart rate and make you sweat (moderate-intensity exercise). Do strengthening exercises at least twice a week. This is in addition to the moderate-intensity exercise. Spend less time sitting. Even light physical activity can be beneficial. Watch cholesterol and blood lipids Have your blood tested for lipids and cholesterol at 85 years of age, then have this test every 5 years. You may need to have your cholesterol levels checked more often if: Your lipid or cholesterol levels are high. You are older than 85 years of  age. You are at high risk for heart disease. What should I know about cancer screening? Many types of cancers can be detected early and may often be prevented. Depending on your health history and family history, you may need to have cancer screening at various ages. This may include screening for: Colorectal cancer. Prostate cancer. Skin cancer. Lung cancer. What should I know about heart disease, diabetes, and high blood pressure? Blood pressure and heart disease High blood pressure causes heart disease and increases the risk of stroke. This is more likely to develop in people who have high blood pressure readings or are overweight. Talk with your health care provider about your target blood pressure readings. Have your blood pressure checked: Every 3-5 years if you are 85-65 years of age. Every year if you are 22 years old or older. If you are between the ages of 80 and 10 and are a current or former smoker, ask your health care provider if you should have a one-time screening for abdominal aortic aneurysm (AAA). Diabetes Have regular diabetes screenings. This checks your fasting blood sugar level. Have the screening done: Once every three years after age 17 if you are at a normal weight and have a low risk for diabetes. More often and at a younger age if you are overweight or have a high risk for diabetes. What should I know about preventing infection? Hepatitis B If you have a higher risk for hepatitis B, you should be screened for this virus. Talk with your health care provider to find out  if you are at risk for hepatitis B infection. Hepatitis C Blood testing is recommended for: Everyone born from [birth year] through 1965. Anyone with known risk factors for hepatitis C. Sexually transmitted infections (STIs) You should be screened each year for STIs, including gonorrhea and chlamydia, if: You are sexually active and are younger than 85 years of age. You are older than 85 years of age  and your health care provider tells you that you are at risk for this type of infection. Your sexual activity has changed since you were last screened, and you are at increased risk for chlamydia or gonorrhea. Ask your health care provider if you are at risk. Ask your health care provider about whether you are at high risk for HIV. Your health care provider may recommend a prescription medicine to help prevent HIV infection. If you choose to take medicine to prevent HIV, you should first get tested for HIV. You should then be tested every 3 months for as long as you are taking the medicine. Follow these instructions at home: Alcohol use Do not drink alcohol if your health care provider tells you not to drink. If you drink alcohol: Limit how much you have to 0-2 drinks a day. Know how much alcohol is in your drink. In the [place] one drink equals one 12 oz bottle of beer (355 mL), one 5 oz glass of wine (148 mL), or one 1 oz glass of hard liquor (44 mL). Lifestyle Do not use any products that contain nicotine or tobacco. These products include cigarettes, chewing tobacco, and vaping devices, such as e-cigarettes. If you need help quitting, ask your health care provider. Do not use street drugs. Do not share needles. Ask your health care provider for help if you need support or information about quitting drugs. General instructions Schedule regular health, dental, and eye exams. Stay current with your vaccines. Tell your health care provider if: You often feel depressed. You have ever been abused or do not feel safe at home. Summary Adopting a healthy lifestyle and getting preventive care are important in promoting health and wellness. Follow your health care provider's instructions about healthy diet, exercising, and getting tested or screened for diseases. Follow your health care provider's instructions on monitoring your cholesterol and blood pressure. This information is not intended to  replace advice given to you by your health care provider. Make sure you discuss any questions you have with your health care provider. Document Revised: 11/28/2020 Document Reviewed: 11/28/2020 Elsevier Patient Education  [employer].

## 2021-06-04 NOTE — Progress Notes (Signed)
Subjective:    Patient ID: Chad Norris, male    DOB: 1932/03/20, 87 y.o.   MRN: [id number]   This visit occurred during the SARS-CoV-2 public health emergency.  Safety protocols were in place, including screening questions prior to the visit, additional usage of staff PPE, and extensive cleaning of exam room while observing appropriate contact time as indicated for disinfecting solutions.   HPI He is here for a physical exam.   He denies any changes in his health.   Medications and allergies reviewed with patient and updated if appropriate.  Patient Active Problem List   Diagnosis Date Noted   Skin lesions 01/25/2021   Prostate cancer ([place])    Nephrolithiasis    COVID-19    Pneumonia 08/24/2020   Swelling of right wrist 06/11/2019   Cervical radiculopathy 06/11/2019   Sensorineural hearing loss, bilateral 05/21/2019   Acute CVA (cerebrovascular accident) ([place]) 08/19/2018   History of CVA (cerebrovascular accident) 08/18/2018   Hyperlipidemia 03/27/2018   Basal cell carcinoma (BCC) of [place] 03/11/2017   Prediabetes 03/11/2017   DDD (degenerative disc disease), lumbosacral 01/13/2014   PROSTATE CANCER, HX OF 06/21/2008   COLONIC POLYPS, HX OF 06/21/2008   DIVERTICULOSIS, COLON 05/07/2008   NEPHROLITHIASIS, HX OF 05/07/2008   History of gout 10/09/2007    Current Outpatient Medications on File Prior to Visit  Medication Sig Dispense Refill   acetaminophen (TYLENOL) 500 MG tablet Take 1,000 mg by mouth every 6 (six) hours as needed (pain).     aspirin 81 MG EC tablet Take 1 tablet (81 mg total) by mouth daily. Swallow whole. 30 tablet 12   atorvastatin (LIPITOR) 40 MG tablet TAKE 1 TABLET(40 MG) BY MOUTH DAILY AT 6 PM 90 tablet 0   Multiple Vitamins-Minerals (ADULT ONE DAILY GUMMIES) CHEW Chew 2 tablets by mouth daily. men     No current facility-administered medications on file prior to visit.    Past Medical History:  Diagnosis Date   Colon polyps    COVID-19     Diverticulosis    Gout    Hemorrhoids    Hyperlipemia    Nephrolithiasis    Prostate cancer ([place])    Stroke [facility])     Past Surgical History:  Procedure Laterality Date   CATARACT EXTRACTION W/ INTRAOCULAR LENS  IMPLANT, BILATERAL     bi-lat   CERVICAL SPINE SURGERY     COLONOSCOPY W/ POLYPECTOMY     Epidural steroid injections     3-4, Dr Ellene Route   RETROPUBIC PROSTATECTOMY     SHOULDER SURGERY     left    Social History   Socioeconomic History   Marital status: Married    Spouse name: Not on file   Number of children: 2   Years of education: Not on file   Highest education level: Not on file  Occupational History   Occupation: retired  Tobacco Use   Smoking status: Never   Smokeless tobacco: Never  Substance and Sexual Activity   Alcohol use: Yes    Alcohol/week: 1.0 standard drink    Types: 1 Cans of beer per week   Drug use: No   Sexual activity: Not on file  Other Topics Concern   Not on file  Social History Narrative   Not on file   Social Determinants of Health   Financial Resource Strain: Low Risk    Difficulty of Paying Living Expenses: Not hard at all  Food Insecurity: Not on file  Transportation  Needs: Not on file  Physical Activity: Not on file  Stress: Not on file  Social Connections: Not on file    Family History  Problem Relation Age of Onset   Kidney cancer Father    Colon cancer Father    Cancer Brother        ?   Heart attack Brother 63   Heart attack Sister 63   Breast cancer Sister    Diabetes Brother    Alcohol abuse Brother    Colon polyps Brother     Review of Systems  Constitutional:  Negative for fever.  HENT:  Positive for drooling and hearing loss.   Eyes:  Negative for visual disturbance.  Respiratory:  Negative for cough, shortness of breath and wheezing.   Cardiovascular:  Negative for chest pain, palpitations and leg swelling.  Gastrointestinal:  Negative for abdominal pain, blood in stool, constipation,  diarrhea and nausea.       No gerd  Genitourinary:  Negative for difficulty urinating and dysuria.  Musculoskeletal:  Positive for arthralgias (mild) and back pain (with certain activities).  Skin:  Negative for rash.  Neurological:  Negative for dizziness, light-headedness and headaches.  Psychiatric/Behavioral:  Negative for dysphoric mood. The patient is not nervous/anxious.       Objective:   Vitals:   06/05/21 0940  BP: 110/70  Pulse: (!) 57  Temp: 98.1 F (36.7 C)  SpO2: 99%   Filed Weights   06/05/21 0940  Weight: 198 lb (89.8 kg)   Body mass index is 26.85 kg/m.  BP Readings from Last 3 Encounters:  06/05/21 110/70  03/01/21 140/78  01/25/21 110/78    Wt Readings from Last 3 Encounters:  06/05/21 198 lb (89.8 kg)  01/25/21 195 lb (88.5 kg)  06/14/20 198 lb 3.2 oz (89.9 kg)     Physical Exam Constitutional: He appears well-developed and well-nourished. No distress.  HENT:  Head: Normocephalic and atraumatic.  Right Ear: External ear normal.  Left Ear: External ear normal.  Mouth/Throat: Oropharynx is clear and moist.  Normal ear canals and TM b/l  Eyes: Conjunctivae and EOM are normal.  Neck: Neck supple. No tracheal deviation present. No thyromegaly present.  No carotid bruit  Cardiovascular: Normal rate, regular rhythm, normal heart sounds and intact distal pulses.   No murmur heard. Pulmonary/Chest: Effort normal and breath sounds normal. No respiratory distress. He has no wheezes. He has no rales.  Abdominal: Soft. He exhibits no distension. There is no tenderness.  Genitourinary: deferred  Musculoskeletal: He exhibits no edema.  Lymphadenopathy:   He has no cervical adenopathy.  Skin: Skin is warm and dry. He is not diaphoretic.  Psychiatric: He has a normal mood and affect. His behavior is normal.         Assessment & Plan:   Physical exam: Screening blood work  ordered Exercise   walks dog some Weight  ok Substance abuse    none   Reviewed recommended immunizations.   Health Maintenance  Topic Date Due   COVID-19 Vaccine (5 - Booster for Pfizer series) 06/21/2021 (Originally 05/24/2021)   Zoster Vaccines- Shingrix (1 of 2) 09/05/2021 (Originally 12/20/1950)   TETANUS/TDAP  03/26/2030   Pneumonia Vaccine 29+ Years old  Completed   INFLUENZA VACCINE  Completed   HPV VACCINES  Aged Out     See Problem List for Assessment and Plan of chronic medical problems.

## 2021-06-05 ENCOUNTER — Other Ambulatory Visit: Payer: Self-pay

## 2021-06-05 ENCOUNTER — Ambulatory Visit (INDEPENDENT_AMBULATORY_CARE_PROVIDER_SITE_OTHER): Payer: Medicare Other | Admitting: Internal Medicine

## 2021-06-05 ENCOUNTER — Encounter: Payer: Self-pay | Admitting: Internal Medicine

## 2021-06-05 VITALS — BP 110/70 | HR 57 | Temp 98.1°F | Ht 72.0 in | Wt 198.0 lb

## 2021-06-05 DIAGNOSIS — R7303 Prediabetes: Secondary | ICD-10-CM

## 2021-06-05 DIAGNOSIS — Z8673 Personal history of transient ischemic attack (TIA), and cerebral infarction without residual deficits: Secondary | ICD-10-CM | POA: Diagnosis not present

## 2021-06-05 DIAGNOSIS — D696 Thrombocytopenia, unspecified: Secondary | ICD-10-CM

## 2021-06-05 DIAGNOSIS — N3281 Overactive bladder: Secondary | ICD-10-CM

## 2021-06-05 DIAGNOSIS — E7849 Other hyperlipidemia: Secondary | ICD-10-CM | POA: Diagnosis not present

## 2021-06-05 DIAGNOSIS — Z0000 Encounter for general adult medical examination without abnormal findings: Secondary | ICD-10-CM | POA: Diagnosis not present

## 2021-06-05 LAB — COMPREHENSIVE METABOLIC PANEL
ALT: 10 U/L (ref 0–53)
AST: 16 U/L (ref 0–37)
Albumin: 4.5 g/dL (ref 3.5–5.2)
Alkaline Phosphatase: 39 U/L (ref 39–117)
BUN: 18 mg/dL (ref 6–23)
CO2: 24 mEq/L (ref 19–32)
Calcium: 9.1 mg/dL (ref 8.4–10.5)
Chloride: 106 mEq/L (ref 96–112)
Creatinine, Ser: 1.26 mg/dL (ref 0.40–1.50)
GFR: 51.3 mL/min — ABNORMAL LOW (ref >60.00)
Glucose, Bld: 94 mg/dL (ref 70–99)
Potassium: 4.3 mEq/L (ref 3.5–5.1)
Sodium: 142 mEq/L (ref 135–145)
Total Bilirubin: 0.9 mg/dL (ref 0.2–1.2)
Total Protein: 7.2 g/dL (ref 6.0–8.3)

## 2021-06-05 LAB — LIPID PANEL
Cholesterol: 125 mg/dL (ref 0–200)
HDL: 39.9 mg/dL (ref >39.00)
LDL Cholesterol: 51 mg/dL (ref 0–99)
NonHDL: 84.68
Total CHOL/HDL Ratio: 3
Triglycerides: 167 mg/dL — ABNORMAL HIGH (ref 0.0–149.0)
VLDL: 33.4 mg/dL (ref 0.0–40.0)

## 2021-06-05 LAB — CBC WITH DIFFERENTIAL/PLATELET
Basophils Absolute: 0 10*3/uL (ref 0.0–0.1)
Basophils Relative: 0.7 % (ref 0.0–3.0)
Eosinophils Absolute: 0.1 10*3/uL (ref 0.0–0.7)
Eosinophils Relative: 1.6 % (ref 0.0–5.0)
HCT: 37.7 % — ABNORMAL LOW (ref 39.0–52.0)
Hemoglobin: 12.8 g/dL — ABNORMAL LOW (ref 13.0–17.0)
Lymphocytes Relative: 48.4 % — ABNORMAL HIGH (ref 12.0–46.0)
Lymphs Abs: 2.3 10*3/uL (ref 0.7–4.0)
MCHC: 34 g/dL (ref 30.0–36.0)
MCV: 97.3 fl (ref 78.0–100.0)
Monocytes Absolute: 0.7 10*3/uL (ref 0.1–1.0)
Monocytes Relative: 15.2 % — ABNORMAL HIGH (ref 3.0–12.0)
Neutro Abs: 1.6 10*3/uL (ref 1.4–7.7)
Neutrophils Relative %: 34.1 % — ABNORMAL LOW (ref 43.0–77.0)
Platelets: 107 10*3/uL — ABNORMAL LOW (ref 150.0–400.0)
RBC: 3.88 Mil/uL — ABNORMAL LOW (ref 4.22–5.81)
RDW: 13.3 % (ref 11.5–15.5)
WBC: 4.8 10*3/uL (ref 4.0–10.5)

## 2021-06-05 LAB — HEMOGLOBIN A1C: Hgb A1c MFr Bld: 5.2 % (ref 4.6–6.5)

## 2021-06-05 LAB — TSH: TSH: 1.81 u[IU]/mL (ref 0.35–5.50)

## 2021-06-05 MED ORDER — MIRABEGRON ER 25 MG PO TB24: 25.0000 mg | ORAL_TABLET | Freq: Every day | ORAL | 5 refills | Status: DC

## 2021-06-05 NOTE — Assessment & Plan Note (Signed)
Chronic Check a1c Low sugar / carb diet Stressed regular exercise  

## 2021-06-05 NOTE — Assessment & Plan Note (Signed)
Chronic Regular exercise and healthy diet encouraged Check lipid panel, CMP Continue atorvastatin 40 mg daily 

## 2021-06-05 NOTE — Assessment & Plan Note (Signed)
chronic Taking ASA 81 mg daily and atorvastatin 40 mg daily CMP, CBC, lipids

## 2021-06-05 NOTE — Assessment & Plan Note (Signed)
New S/p prostatectomy  Frequent urination, especially at night Will try myrbetriq 25 mg daily - can titrate if tolerated

## 2021-06-06 ENCOUNTER — Encounter: Payer: Self-pay | Admitting: Internal Medicine

## 2021-06-06 DIAGNOSIS — D696 Thrombocytopenia, unspecified: Secondary | ICD-10-CM | POA: Insufficient documentation

## 2021-06-06 NOTE — Addendum Note (Signed)
Addended by: Binnie Rail on: 06/06/2021 05:41 AM   Modules accepted: Orders

## 2021-06-29 ENCOUNTER — Encounter: Payer: Self-pay | Admitting: Internal Medicine

## 2021-06-29 ENCOUNTER — Other Ambulatory Visit: Payer: Self-pay

## 2021-06-29 ENCOUNTER — Other Ambulatory Visit (INDEPENDENT_AMBULATORY_CARE_PROVIDER_SITE_OTHER): Payer: Medicare Other

## 2021-06-29 DIAGNOSIS — D649 Anemia, unspecified: Secondary | ICD-10-CM

## 2021-06-29 DIAGNOSIS — D696 Thrombocytopenia, unspecified: Secondary | ICD-10-CM

## 2021-06-29 LAB — CBC WITH DIFFERENTIAL/PLATELET
Basophils Absolute: 0 10*3/uL (ref 0.0–0.1)
Basophils Relative: 0.4 % (ref 0.0–3.0)
Eosinophils Absolute: 0.1 10*3/uL (ref 0.0–0.7)
Eosinophils Relative: 1.8 % (ref 0.0–5.0)
HCT: 35.6 % — ABNORMAL LOW (ref 39.0–52.0)
Hemoglobin: 12.2 g/dL — ABNORMAL LOW (ref 13.0–17.0)
Lymphocytes Relative: 52 % — ABNORMAL HIGH (ref 12.0–46.0)
Lymphs Abs: 2.4 10*3/uL (ref 0.7–4.0)
MCHC: 34.3 g/dL (ref 30.0–36.0)
MCV: 97.2 fl (ref 78.0–100.0)
Monocytes Absolute: 0.7 10*3/uL (ref 0.1–1.0)
Monocytes Relative: 15.1 % — ABNORMAL HIGH (ref 3.0–12.0)
Neutro Abs: 1.4 10*3/uL (ref 1.4–7.7)
Neutrophils Relative %: 30.7 % — ABNORMAL LOW (ref 43.0–77.0)
Platelets: 116 10*3/uL — ABNORMAL LOW (ref 150.0–400.0)
RBC: 3.67 Mil/uL — ABNORMAL LOW (ref 4.22–5.81)
RDW: 13.5 % (ref 11.5–15.5)
WBC: 4.6 10*3/uL (ref 4.0–10.5)

## 2021-07-03 NOTE — Telephone Encounter (Signed)
I was able to speak with the pt and inform him of Dr. Quay Burow instructions. Pt stated he understood and will be going this week to have his levels rechecked.

## 2021-07-04 ENCOUNTER — Other Ambulatory Visit (INDEPENDENT_AMBULATORY_CARE_PROVIDER_SITE_OTHER): Payer: Medicare Other

## 2021-07-04 DIAGNOSIS — D696 Thrombocytopenia, unspecified: Secondary | ICD-10-CM | POA: Diagnosis not present

## 2021-07-04 DIAGNOSIS — D649 Anemia, unspecified: Secondary | ICD-10-CM

## 2021-07-04 LAB — CBC WITH DIFFERENTIAL/PLATELET
Basophils Absolute: 0 10*3/uL (ref 0.0–0.1)
Basophils Relative: 0.5 % (ref 0.0–3.0)
Eosinophils Absolute: 0.1 10*3/uL (ref 0.0–0.7)
Eosinophils Relative: 1.5 % (ref 0.0–5.0)
HCT: 38.3 % — ABNORMAL LOW (ref 39.0–52.0)
Hemoglobin: 13 g/dL (ref 13.0–17.0)
Lymphocytes Relative: 53.6 % — ABNORMAL HIGH (ref 12.0–46.0)
Lymphs Abs: 2.1 10*3/uL (ref 0.7–4.0)
MCHC: 33.9 g/dL (ref 30.0–36.0)
MCV: 96.4 fl (ref 78.0–100.0)
Monocytes Absolute: 0.5 10*3/uL (ref 0.1–1.0)
Monocytes Relative: 13.1 % — ABNORMAL HIGH (ref 3.0–12.0)
Neutro Abs: 1.2 10*3/uL — ABNORMAL LOW (ref 1.4–7.7)
Neutrophils Relative %: 31.3 % — ABNORMAL LOW (ref 43.0–77.0)
Platelets: 100 10*3/uL — ABNORMAL LOW (ref 150.0–400.0)
RBC: 3.98 Mil/uL — ABNORMAL LOW (ref 4.22–5.81)
RDW: 13.3 % (ref 11.5–15.5)
WBC: 3.9 10*3/uL — ABNORMAL LOW (ref 4.0–10.5)

## 2021-07-04 LAB — IBC + FERRITIN
Ferritin: 363.5 ng/mL — ABNORMAL HIGH (ref 22.0–322.0)
Iron: 121 ug/dL (ref 42–165)
Saturation Ratios: 44.6 % (ref 20.0–50.0)
TIBC: 271.6 ug/dL (ref 250.0–450.0)
Transferrin: 194 mg/dL — ABNORMAL LOW (ref 212.0–360.0)

## 2021-07-04 LAB — FOLATE: Folate: >23.4 ng/mL (ref >5.9)

## 2021-07-04 LAB — VITAMIN B12: Vitamin B-12: 376 pg/mL (ref 211–911)

## 2021-07-06 ENCOUNTER — Other Ambulatory Visit (INDEPENDENT_AMBULATORY_CARE_PROVIDER_SITE_OTHER): Payer: Medicare Other

## 2021-07-06 ENCOUNTER — Other Ambulatory Visit: Payer: Self-pay

## 2021-07-06 DIAGNOSIS — D649 Anemia, unspecified: Secondary | ICD-10-CM | POA: Diagnosis not present

## 2021-07-06 DIAGNOSIS — D696 Thrombocytopenia, unspecified: Secondary | ICD-10-CM | POA: Diagnosis not present

## 2021-07-06 LAB — CBC WITH DIFFERENTIAL/PLATELET
Basophils Absolute: 0 10*3/uL (ref 0.0–0.1)
Basophils Relative: 0.6 % (ref 0.0–3.0)
Eosinophils Absolute: 0.1 10*3/uL (ref 0.0–0.7)
Eosinophils Relative: 1.3 % (ref 0.0–5.0)
HCT: 37.6 % — ABNORMAL LOW (ref 39.0–52.0)
Hemoglobin: 13 g/dL (ref 13.0–17.0)
Lymphocytes Relative: 43.3 % (ref 12.0–46.0)
Lymphs Abs: 2.1 10*3/uL (ref 0.7–4.0)
MCHC: 34.5 g/dL (ref 30.0–36.0)
MCV: 96.6 fl (ref 78.0–100.0)
Monocytes Absolute: 0.8 10*3/uL (ref 0.1–1.0)
Monocytes Relative: 17.2 % — ABNORMAL HIGH (ref 3.0–12.0)
Neutro Abs: 1.8 10*3/uL (ref 1.4–7.7)
Neutrophils Relative %: 37.6 % — ABNORMAL LOW (ref 43.0–77.0)
Platelets: 99 10*3/uL — ABNORMAL LOW (ref 150.0–400.0)
RBC: 3.89 Mil/uL — ABNORMAL LOW (ref 4.22–5.81)
RDW: 13.3 % (ref 11.5–15.5)
WBC: 4.9 10*3/uL (ref 4.0–10.5)

## 2021-07-06 NOTE — Addendum Note (Signed)
Addended by: Jacobo Forest on: 07/06/2021 08:20 AM   Modules accepted: Orders

## 2021-07-07 LAB — IRON,TIBC AND FERRITIN PANEL
%SAT: 35 % (calc) (ref 20–48)
Ferritin: 307 ng/mL (ref 24–380)
Iron: 93 ug/dL (ref 50–180)
TIBC: 262 mcg/dL (calc) (ref 250–425)

## 2021-07-10 DIAGNOSIS — D696 Thrombocytopenia, unspecified: Secondary | ICD-10-CM

## 2021-07-31 ENCOUNTER — Other Ambulatory Visit: Payer: Self-pay | Admitting: Family

## 2021-07-31 DIAGNOSIS — D696 Thrombocytopenia, unspecified: Secondary | ICD-10-CM

## 2021-08-01 ENCOUNTER — Encounter: Payer: Self-pay | Admitting: Family

## 2021-08-01 ENCOUNTER — Inpatient Hospital Stay: Payer: Medicare Other | Attending: Internal Medicine

## 2021-08-01 ENCOUNTER — Inpatient Hospital Stay: Payer: Medicare Other | Admitting: Family

## 2021-08-01 ENCOUNTER — Other Ambulatory Visit: Payer: Self-pay

## 2021-08-01 VITALS — BP 112/61 | HR 52 | Temp 98.0°F | Resp 18 | Ht 72.0 in | Wt 194.0 lb

## 2021-08-01 DIAGNOSIS — Z8719 Personal history of other diseases of the digestive system: Secondary | ICD-10-CM

## 2021-08-01 DIAGNOSIS — Z8546 Personal history of malignant neoplasm of prostate: Secondary | ICD-10-CM | POA: Diagnosis not present

## 2021-08-01 DIAGNOSIS — D696 Thrombocytopenia, unspecified: Secondary | ICD-10-CM

## 2021-08-01 DIAGNOSIS — Z8673 Personal history of transient ischemic attack (TIA), and cerebral infarction without residual deficits: Secondary | ICD-10-CM | POA: Insufficient documentation

## 2021-08-01 LAB — CBC WITH DIFFERENTIAL (CANCER CENTER ONLY)
Abs Immature Granulocytes: 0.05 10*3/uL (ref 0.00–0.07)
Basophils Absolute: 0 10*3/uL (ref 0.0–0.1)
Basophils Relative: 0 %
Eosinophils Absolute: 0.1 10*3/uL (ref 0.0–0.5)
Eosinophils Relative: 1 %
HCT: 37.5 % — ABNORMAL LOW (ref 39.0–52.0)
Hemoglobin: 12.6 g/dL — ABNORMAL LOW (ref 13.0–17.0)
Immature Granulocytes: 1 %
Lymphocytes Relative: 44 %
Lymphs Abs: 2.4 10*3/uL (ref 0.7–4.0)
MCH: 33.1 pg (ref 26.0–34.0)
MCHC: 33.6 g/dL (ref 30.0–36.0)
MCV: 98.4 fL (ref 80.0–100.0)
Monocytes Absolute: 0.8 10*3/uL (ref 0.1–1.0)
Monocytes Relative: 15 %
Neutro Abs: 2.1 10*3/uL (ref 1.7–7.7)
Neutrophils Relative %: 39 %
Platelet Count: 147 10*3/uL — ABNORMAL LOW (ref 150–400)
RBC: 3.81 MIL/uL — ABNORMAL LOW (ref 4.22–5.81)
RDW: 13.2 % (ref 11.5–15.5)
WBC Count: 5.4 10*3/uL (ref 4.0–10.5)
nRBC: 0 % (ref 0.0–0.2)

## 2021-08-01 LAB — CMP (CANCER CENTER ONLY)
ALT: 13 U/L (ref 0–44)
AST: 17 U/L (ref 15–41)
Albumin: 4.7 g/dL (ref 3.5–5.0)
Alkaline Phosphatase: 43 U/L (ref 38–126)
Anion gap: 6 (ref 5–15)
BUN: 22 mg/dL (ref 8–23)
CO2: 28 mmol/L (ref 22–32)
Calcium: 9.8 mg/dL (ref 8.9–10.3)
Chloride: 105 mmol/L (ref 98–111)
Creatinine: 1.22 mg/dL (ref 0.61–1.24)
GFR, Estimated: 57 mL/min — ABNORMAL LOW (ref >60)
Glucose, Bld: 100 mg/dL — ABNORMAL HIGH (ref 70–99)
Potassium: 4.4 mmol/L (ref 3.5–5.1)
Sodium: 139 mmol/L (ref 135–145)
Total Bilirubin: 0.9 mg/dL (ref 0.3–1.2)
Total Protein: 7.1 g/dL (ref 6.5–8.1)

## 2021-08-01 LAB — LACTATE DEHYDROGENASE: LDH: 187 U/L (ref 98–192)

## 2021-08-01 LAB — SAVE SMEAR(SSMR), FOR PROVIDER SLIDE REVIEW

## 2021-08-01 NOTE — Progress Notes (Signed)
Hematology/Oncology Consultation   Name: Chad Norris      MRN: [id number]    Location: Room/bed info not found  Date: 08/01/2021 Time:9:24 AM   REFERRING PHYSICIAN: Billey Gosling, MD  REASON FOR CONSULT:  Thrombocytopenia    DIAGNOSIS: Mild thrombocytopenia   HISTORY OF PRESENT ILLNESS:  Chad Norris is a very pleasant 88 yo caucasian gentleman with thrombocytopenia first noted in September 2022.  Platelets today are stable at 147, Hgb 12.6, MCV 98 and WBC count is 5.4.  No blood loss, bruising or petechiae.  No issue with frequent infections.  He has chronic fatigue since his stroke in 2020. He also notes that he has had issues with balance since that time.  No fall or syncope to report.  He denies any lingering weakness or deficit from the stroke.  He was in the ED in September with heat exhaustion and dehydration after spending the day watching the presidential  He is hard of hearing and uses hearing aids which are connected to his cell phone.  Spleen is in. No known history of liver issues.  No history of diabetes or thyroid disease.  He has history of basal cell carcinoma removed from the right side of his nose. He also states that he had prostate cancer in the early 2000's and had a prostatectomy. No chemo or radiation.  Familial history of cancer includes sister with melanoma and father with oral cancer (smoker).  No fever, chills, n/v, cough, rash, dizziness, SOB, chest pain, palpitations, abdominal pain or changes in bowel or bladder habits. He avoids certain foods that he knows will cause constipation.  No swelling, tenderness, numbness or tingling in his extremities.  No history of smoking, ETOH or recreational drug use.  He has a good appetite and is doing his best to stay well hydrated. His weight is described as stable at 194 lbs.  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   ROS: All other 10 point review of systems is negative.   PAST MEDICAL  HISTORY:   Past Medical History:  Diagnosis Date   Colon polyps    COVID-19    Diverticulosis    Gout    Hemorrhoids    Hyperlipemia    Nephrolithiasis    Prostate cancer ([place])    Stroke ([place])     ALLERGIES: Allergies  Allergen Reactions   Doxycycline Rash      MEDICATIONS:  Current Outpatient Medications on File Prior to Visit  Medication Sig Dispense Refill   acetaminophen (TYLENOL) 500 MG tablet Take 1,000 mg by mouth every 6 (six) hours as needed (pain).     aspirin 81 MG EC tablet Take 1 tablet (81 mg total) by mouth daily. Swallow whole. 30 tablet 12   atorvastatin (LIPITOR) 40 MG tablet TAKE 1 TABLET(40 MG) BY MOUTH DAILY AT 6 PM 90 tablet 0   mirabegron ER (MYRBETRIQ) 25 MG TB24 tablet Take 1 tablet (25 mg total) by mouth daily. 30 tablet 5   Multiple Vitamins-Minerals (ADULT ONE DAILY GUMMIES) CHEW Chew 2 tablets by mouth daily. men     No current facility-administered medications on file prior to visit.     PAST SURGICAL HISTORY Past Surgical History:  Procedure Laterality Date   CATARACT EXTRACTION W/ INTRAOCULAR LENS  IMPLANT, BILATERAL     bi-lat   CERVICAL SPINE SURGERY     COLONOSCOPY W/ POLYPECTOMY     Epidural steroid injections     3-4, Dr Ellene Route  RETROPUBIC PROSTATECTOMY     SHOULDER SURGERY     left    FAMILY HISTORY: Family History  Problem Relation Age of Onset   Kidney cancer Father    Colon cancer Father    Cancer Brother        ?   Heart attack Brother 65   Heart attack Sister 9   Breast cancer Sister    Diabetes Brother    Alcohol abuse Brother    Colon polyps Brother     SOCIAL HISTORY:  reports that he has never smoked. He has never used smokeless tobacco. He reports current alcohol use of about 1.0 standard drink per week. He reports that he does not use drugs.  PERFORMANCE STATUS: The patient's performance status is 0 - Asymptomatic  PHYSICAL EXAM: Most Recent Vital Signs: There were no vitals taken for this  visit. BP 112/61 (BP Location: Right Arm, Patient Position: Sitting)    Pulse (!) 52    Temp 98 F (36.7 C) (Oral)    Resp 18    Ht 6' (1.829 m)    Wt 194 lb (88 kg)    SpO2 100%    BMI 26.31 kg/m   General Appearance:    Alert, cooperative, no distress, appears stated age  Head:    Normocephalic, without obvious abnormality, atraumatic  Eyes:    PERRL, conjunctiva/corneas clear, EOM's intact, fundi    benign, both eyes             Throat:   Lips, mucosa, and tongue normal; teeth and gums normal  Neck:   Supple, symmetrical, trachea midline, no adenopathy;       thyroid:  No enlargement/tenderness/nodules; no carotid   bruit or JVD  Back:     Symmetric, no curvature, ROM normal, no CVA tenderness  Lungs:     Clear to auscultation bilaterally, respirations unlabored  Chest wall:    No tenderness or deformity  Heart:    Regular rate and rhythm, S1 and S2 normal, no murmur, rub   or gallop  Abdomen:     Soft, non-tender, bowel sounds active all four quadrants,    no masses, no organomegaly        Extremities:   Extremities normal, atraumatic, no cyanosis or edema  Pulses:   2+ and symmetric all extremities  Skin:   Skin color, texture, turgor normal, no rashes or lesions  Lymph nodes:   Cervical, supraclavicular, and axillary nodes normal  Neurologic:   CNII-XII intact. Normal strength, sensation and reflexes      throughout    LABORATORY DATA:  Results for orders placed or performed in visit on 08/01/21 (from the past 48 hour(s))  CBC with Differential ([place] Only)     Status: Abnormal   Collection Time: 08/01/21  8:33 AM  Result Value Ref Range   WBC Count 5.4 4.0 - 10.5 K/uL   RBC 3.81 (L) 4.22 - 5.81 MIL/uL   Hemoglobin 12.6 (L) 13.0 - 17.0 g/dL   HCT 37.5 (L) 39.0 - 52.0 %   MCV 98.4 80.0 - 100.0 fL   MCH 33.1 26.0 - 34.0 pg   MCHC 33.6 30.0 - 36.0 g/dL   RDW 13.2 11.5 - 15.5 %   Platelet Count 147 (L) 150 - 400 K/uL   nRBC 0.0 0.0 - 0.2 %   Neutrophils  Relative % 39 %   Neutro Abs 2.1 1.7 - 7.7 K/uL   Lymphocytes Relative 44 %   Lymphs  Abs 2.4 0.7 - 4.0 K/uL   Monocytes Relative 15 %   Monocytes Absolute 0.8 0.1 - 1.0 K/uL   Eosinophils Relative 1 %   Eosinophils Absolute 0.1 0.0 - 0.5 K/uL   Basophils Relative 0 %   Basophils Absolute 0.0 0.0 - 0.1 K/uL   Immature Granulocytes 1 %   Abs Immature Granulocytes 0.05 0.00 - 0.07 K/uL    Comment: Performed at [facility] Lab at [facility], [address] [place], [place] [address]  CMP ([place] only)     Status: Abnormal   Collection Time: 08/01/21  8:33 AM  Result Value Ref Range   Sodium 139 135 - 145 mmol/L   Potassium 4.4 3.5 - 5.1 mmol/L   Chloride 105 98 - 111 mmol/L   CO2 28 22 - 32 mmol/L   Glucose, Bld 100 (H) 70 - 99 mg/dL    Comment: Glucose reference range applies only to samples taken after fasting for at least 8 hours.   BUN 22 8 - 23 mg/dL   Creatinine 1.22 0.61 - 1.24 mg/dL   Calcium 9.8 8.9 - 10.3 mg/dL   Total Protein 7.1 6.5 - 8.1 g/dL   Albumin 4.7 3.5 - 5.0 g/dL   AST 17 15 - 41 U/L   ALT 13 0 - 44 U/L   Alkaline Phosphatase 43 38 - 126 U/L   Total Bilirubin 0.9 0.3 - 1.2 mg/dL   GFR, Estimated 57 (L) >60 mL/min    Comment: (NOTE) Calculated using the CKD-EPI Creatinine Equation (2021)    Anion gap 6 5 - 15    Comment: Performed at [facility] Lab at [facility], [address], [place], [place] [address]  Save Smear [facility])     Status: None   Collection Time: 08/01/21  8:33 AM  Result Value Ref Range   Smear Review SMEAR STAINED AND AVAILABLE FOR REVIEW     Comment: Performed at [facility] Lab at [facility], [address], [place], [place] [address]      RADIOGRAPHY: No results found.     PATHOLOGY: None  ASSESSMENT/PLAN: Mr. Yonke is a very pleasant 88 yo caucasian gentleman with thrombocytopenia first noted in September 2022.  Blood work and smear reviewed with  Dr. Marin Olp. No abnormality or evidence of malignancy noted.  At this time we will release him back to his PCP. No follow-up needed.   All questions were answered. He can certainly contact our office with any future heme/onc questions or concerns. We can certainly see him again if needed.   The patient was discussed with Dr. Marin Olp and he is in agreement with the aforementioned.   Lottie Dawson, NP

## 2021-08-02 ENCOUNTER — Telehealth: Payer: Self-pay | Admitting: *Deleted

## 2021-08-02 NOTE — Telephone Encounter (Signed)
Per 08/01/21 los - no follow-up needed

## 2021-08-15 ENCOUNTER — Other Ambulatory Visit: Payer: Self-pay | Admitting: Internal Medicine

## 2021-08-15 DIAGNOSIS — E7849 Other hyperlipidemia: Secondary | ICD-10-CM

## 2022-02-05 ENCOUNTER — Ambulatory Visit
Admission: EM | Admit: 2022-02-05 | Discharge: 2022-02-05 | Disposition: A | Discharge status: Home / Self Care | Payer: Medicare Other | Attending: Emergency Medicine | Admitting: Emergency Medicine

## 2022-02-05 ENCOUNTER — Encounter: Payer: Self-pay | Admitting: Emergency Medicine

## 2022-02-05 DIAGNOSIS — H6011 Cellulitis of right external ear: Secondary | ICD-10-CM

## 2022-02-05 DIAGNOSIS — H60391 Other infective otitis externa, right ear: Secondary | ICD-10-CM

## 2022-02-05 MED ORDER — CIPROFLOXACIN-DEXAMETHASONE 0.3-0.1 % OT SUSP: 4.0000 [drp] | Freq: Two times a day (BID) | OTIC | 0 refills | Status: DC

## 2022-02-05 MED ORDER — AMOXICILLIN-POT CLAVULANATE 875-125 MG PO TABS: 1.0000 | ORAL_TABLET | Freq: Two times a day (BID) | ORAL | 0 refills | Status: AC

## 2022-02-05 MED ORDER — CEFTRIAXONE SODIUM 1 G IJ SOLR
1.0000 g | Freq: Once | INTRAMUSCULAR | Status: AC
  Administered 2022-02-05: 1 g via INTRAMUSCULAR

## 2022-02-05 NOTE — ED Provider Notes (Signed)
[facility]    CSN: [id number] Arrival date & time: 02/05/22  0848    HISTORY   Chief Complaint  Patient presents with   Otalgia   HPI Chad Norris is a pleasant, 88 y.o. male who presents to urgent care today. Patient complains of 3-week history of right ear pain and swelling.  Patient states he wears hearing aids but is unable to wear his right-sided hearing aid due to pain and swelling.  Patient states it was so painful last night that he was unable to lie down.  Patient states he has not had any fever or noticed any drainage from his right ear.  The history is provided by the patient.   Past Medical History:  Diagnosis Date   Colon polyps    COVID-19    Diverticulosis    Gout    Hemorrhoids    Hyperlipemia    Nephrolithiasis    Prostate cancer ([place])    Stroke [facility])    Patient Active Problem List   Diagnosis Date Noted   Thrombocytopenia ([place]) 06/06/2021   Overactive bladder 06/05/2021   Skin lesions 01/25/2021   Nephrolithiasis    COVID-19    Pneumonia 08/24/2020   Swelling of right wrist 06/11/2019   Cervical radiculopathy 06/11/2019   Sensorineural hearing loss, bilateral 05/21/2019   Acute CVA (cerebrovascular accident) ([place]) 08/19/2018   History of CVA (cerebrovascular accident) 08/18/2018   Hyperlipidemia 03/27/2018   Basal cell carcinoma (BCC) of [place] 03/11/2017   Prediabetes 03/11/2017   DDD (degenerative disc disease), lumbosacral 01/13/2014   PROSTATE CANCER, HX OF 06/21/2008   COLONIC POLYPS, HX OF 06/21/2008   DIVERTICULOSIS, COLON 05/07/2008   NEPHROLITHIASIS, HX OF 05/07/2008   History of gout 10/09/2007   Past Surgical History:  Procedure Laterality Date   CATARACT EXTRACTION W/ INTRAOCULAR LENS  IMPLANT, BILATERAL     bi-lat   CERVICAL SPINE SURGERY     COLONOSCOPY W/ POLYPECTOMY     Epidural steroid injections     3-4, Dr Ellene Route   RETROPUBIC PROSTATECTOMY     SHOULDER SURGERY     left    Home Medications    Prior  to Admission medications   Medication Sig Start Date End Date Taking? Authorizing Provider  amoxicillin-clavulanate (AUGMENTIN) 875-125 MG tablet Take 1 tablet by mouth every 12 (twelve) hours for 7 days. 02/05/22 02/12/22 Yes Lynden Oxford Scales, PA-C  ciprofloxacin-dexamethasone (CIPRODEX) OTIC suspension Place 4 drops into the right ear 2 (two) times daily. X 7 days 02/05/22  Yes Lynden Oxford Scales, PA-C  acetaminophen (TYLENOL) 500 MG tablet Take 1,000 mg by mouth every 6 (six) hours as needed (pain).    [provider]  aspirin 81 MG EC tablet Take 1 tablet (81 mg total) by mouth daily. Swallow whole. 05/15/21   Binnie Rail, MD  atorvastatin (LIPITOR) 40 MG tablet TAKE 1 TABLET(40 MG) BY MOUTH DAILY AT 6 PM 08/15/21   Burns, Claudina Lick, MD  mirabegron ER (MYRBETRIQ) 25 MG TB24 tablet Take 1 tablet (25 mg total) by mouth daily. 06/05/21   Binnie Rail, MD  Multiple Vitamins-Minerals (ADULT ONE DAILY GUMMIES) CHEW Chew 2 tablets by mouth daily. men    [provider]    Family History Family History  Problem Relation Age of Onset   Kidney cancer Father    Colon cancer Father    Cancer Brother        ?   Heart attack Brother 55   Heart  attack Sister 37   Breast cancer Sister    Diabetes Brother    Alcohol abuse Brother    Colon polyps Brother    Social History Social History   Tobacco Use   Smoking status: Never   Smokeless tobacco: Never  Vaping Use   Vaping Use: Never used  Substance Use Topics   Alcohol use: Yes    Alcohol/week: 1.0 standard drink of alcohol    Types: 1 Cans of beer per week   Drug use: No   Allergies   Doxycycline  Review of Systems Review of Systems Pertinent findings revealed after performing a 14 point review of systems has been noted in the history of present illness.  Physical Exam Triage Vital Signs ED Triage Vitals  Enc Vitals Group     BP 05/19/21 0827 (!) 147/82     Pulse Rate 05/19/21 0827 72     Resp 05/19/21  0827 18     Temp 05/19/21 0827 98.3 F (36.8 C)     Temp Source 05/19/21 0827 Oral     SpO2 05/19/21 0827 98 %     Weight --      Height --      Head Circumference --      Peak Flow --      Pain Score 05/19/21 0826 5     Pain Loc --      Pain Edu? --      Excl. in [place]? --   No data found.  Updated Vital Signs BP 122/73   Pulse 63   Temp 97.9 F (36.6 C)   Resp 20   SpO2 96%   Physical Exam Vitals and nursing note reviewed.  Constitutional:      General: He is not in acute distress.    Appearance: Normal appearance. He is normal weight. He is not ill-appearing.  HENT:     Head: Normocephalic and atraumatic.     Ears:     Comments: Patient has decreased hearing in both ears, wearing hearing aid in left ear.  I am unable to visualize right TM due to severe swelling of right EAC, there is purulent fluid present in [facility] for as far as I can see.  Left TM and EAC appear normal.    Nose: Nose normal.     Mouth/Throat:     Lips: Pink.     Mouth: Mucous membranes are moist.     Tongue: No lesions.     Palate: No mass.     Pharynx: Oropharynx is clear. Uvula midline.     Tonsils: 0 on the right. 0 on the left.  Eyes:     Extraocular Movements: Extraocular movements intact.     Conjunctiva/sclera: Conjunctivae normal.     Pupils: Pupils are equal, round, and reactive to light.  Cardiovascular:     Rate and Rhythm: Normal rate and regular rhythm.  Pulmonary:     Effort: Pulmonary effort is normal.     Breath sounds: Normal breath sounds.  Musculoskeletal:        General: Normal range of motion.     Cervical back: Full passive range of motion without pain, normal range of motion and neck supple.  Lymphadenopathy:     Cervical: No cervical adenopathy.  Skin:    General: Skin is warm and dry.  Neurological:     General: No focal deficit present.     Mental Status: He is alert and oriented to person, place, and time. Mental  status is at baseline.  Psychiatric:        Mood and  Affect: Mood normal.        Behavior: Behavior normal.        Thought Content: Thought content normal.        Judgment: Judgment normal.     Visual Acuity Right Eye Distance:   Left Eye Distance:   Bilateral Distance:    Right Eye Near:   Left Eye Near:    Bilateral Near:     UC Couse / Diagnostics / Procedures:     Radiology No results found.  Procedures Procedures (including critical care time) EKG  Pending results:  Labs Reviewed - No data to display  Medications Ordered in UC: Medications  cefTRIAXone (ROCEPHIN) injection 1 g (has no administration in time range)    UC Diagnoses / Final Clinical Impressions(s)   I have reviewed the triage vital signs and the nursing notes.  Pertinent labs & imaging results that were available during my care of the patient were reviewed by me and considered in my medical decision making (see chart for details).    Final diagnoses:  Cellulitis of concha of right ear  Acute infective otitis externa of right ear   Patient was provided with an injection of ceftriaxone for the significant swelling of his right external ear.  Patient also provided with a prescription for Augmentin and Ciprodex.  Patient advised that for the next day or 2 he may not be able to get Ciprodex drops done but the Augmentin and ceftriaxone should reduce the swelling soon.  Turn precautions advised.  ED Prescriptions     Medication Sig Dispense Auth. Provider   amoxicillin-clavulanate (AUGMENTIN) 875-125 MG tablet Take 1 tablet by mouth every 12 (twelve) hours for 7 days. 14 tablet Lynden Oxford Scales, PA-C   ciprofloxacin-dexamethasone (CIPRODEX) OTIC suspension Place 4 drops into the right ear 2 (two) times daily. X 7 days 7.5 mL Lynden Oxford Scales, PA-C      PDMP not reviewed this encounter.  Disposition Upon Discharge:  Condition: stable for discharge home Home: take medications as prescribed; routine discharge instructions as discussed;  follow up as advised.  Patient presented with an acute illness with associated systemic symptoms and significant discomfort requiring urgent management. In my opinion, this is a condition that a prudent lay person (someone who possesses an average knowledge of health and medicine) may potentially expect to result in complications if not addressed urgently such as respiratory distress, impairment of bodily function or dysfunction of bodily organs.   Routine symptom specific, illness specific and/or disease specific instructions were discussed with the patient and/or caregiver at length.   As such, the patient has been evaluated and assessed, work-up was performed and treatment was provided in alignment with urgent care protocols and evidence based medicine.  Patient/parent/caregiver has been advised that the patient may require follow up for further testing and treatment if the symptoms continue in spite of treatment, as clinically indicated and appropriate.  If the patient was tested for COVID-19, Influenza and/or RSV, then the patient/parent/guardian was advised to isolate at home pending the results of his/her diagnostic coronavirus test and potentially longer if they're positive. I have also advised pt that if his/her COVID-19 test returns positive, it's recommended to self-isolate for at least 10 days after symptoms first appeared AND until fever-free for 24 hours without fever reducer AND other symptoms have improved or resolved. Discussed self-isolation recommendations as well as instructions for household member/close  contacts as per the CDC and [place] DHHS, and also gave patient the [place] packet with this information.  Patient/parent/caregiver has been advised to return to the [facility] or PCP in 3-5 days if no better; to PCP or the Emergency Department if new signs and symptoms develop, or if the current signs or symptoms continue to change or worsen for further workup, evaluation and treatment as  clinically indicated and appropriate  The patient will follow up with their current PCP if and as advised. If the patient does not currently have a PCP we will assist them in obtaining one.   The patient may need specialty follow up if the symptoms continue, in spite of conservative treatment and management, for further workup, evaluation, consultation and treatment as clinically indicated and appropriate.  Patient/parent/caregiver verbalized understanding and agreement of plan as discussed.  All questions were addressed during visit.  Please see discharge instructions below for further details of plan.  Discharge Instructions:   Discharge Instructions      To treat the infection of your right ear, you received an injection of ceftriaxone 1 g to initiate antibiotic treatment.  Please pick up your prescriptions for Augmentin and ciprofloxacin at your pharmacy.  Please take 1 tablet of Augmentin twice daily for the next 7 days.  Once the swelling of your right ear canal has receded, please instill 4 drops of Ciprodex into your right ear canal twice daily for full 7 days.  It may take it a few days before the swelling has sufficiently improved.  Thank you for visiting urgent care today.  If you do not see meaningful improvement in the next 2 to 3 days, please return for repeat evaluation and adjustment of your treatment regimen.    This office note has been dictated using Museum/gallery curator.  Unfortunately, this method of dictation can sometimes lead to typographical or grammatical errors.  I apologize for your inconvenience in advance if this occurs.  Please do not hesitate to reach out to me if clarification is needed.      Lynden Oxford Scales, PA-C 02/05/22 [phone]

## 2022-02-05 NOTE — Discharge Instructions (Addendum)
To treat the infection of your right ear, you received an injection of ceftriaxone 1 g to initiate antibiotic treatment.  Please pick up your prescriptions for Augmentin and ciprofloxacin at your pharmacy.  Please take 1 tablet of Augmentin twice daily for the next 7 days.  Once the swelling of your right ear canal has receded, please instill 4 drops of Ciprodex into your right ear canal twice daily for full 7 days.  It may take it a few days before the swelling has sufficiently improved.  Thank you for visiting urgent care today.  If you do not see meaningful improvement in the next 2 to 3 days, please return for repeat evaluation and adjustment of your treatment regimen.

## 2022-02-05 NOTE — ED Triage Notes (Signed)
Pt here with right ear pain and fullness x 10 days.

## 2022-02-19 ENCOUNTER — Encounter: Payer: Self-pay | Admitting: Internal Medicine

## 2022-02-26 ENCOUNTER — Ambulatory Visit: Payer: Medicare Other | Admitting: Internal Medicine

## 2022-02-26 DIAGNOSIS — H61891 Other specified disorders of right external ear: Secondary | ICD-10-CM | POA: Diagnosis not present

## 2022-02-26 DIAGNOSIS — H90A22 Sensorineural hearing loss, unilateral, left ear, with restricted hearing on the contralateral side: Secondary | ICD-10-CM | POA: Diagnosis not present

## 2022-02-26 DIAGNOSIS — H90A31 Mixed conductive and sensorineural hearing loss, unilateral, right ear with restricted hearing on the contralateral side: Secondary | ICD-10-CM | POA: Diagnosis not present

## 2022-03-19 DIAGNOSIS — H60391 Other infective otitis externa, right ear: Secondary | ICD-10-CM | POA: Diagnosis not present

## 2022-03-19 DIAGNOSIS — Z974 Presence of external hearing-aid: Secondary | ICD-10-CM | POA: Diagnosis not present

## 2022-03-19 DIAGNOSIS — H903 Sensorineural hearing loss, bilateral: Secondary | ICD-10-CM | POA: Diagnosis not present

## 2022-04-09 ENCOUNTER — Ambulatory Visit (INDEPENDENT_AMBULATORY_CARE_PROVIDER_SITE_OTHER): Payer: Medicare Other

## 2022-04-09 DIAGNOSIS — Z23 Encounter for immunization: Secondary | ICD-10-CM | POA: Diagnosis not present

## 2022-04-09 NOTE — Progress Notes (Signed)
After obtaining consent, and per orders of Dr. Quay Burow, injection of Flu was given on the left deltoid by Marrian Salvage. Patient tolerated well and instructed to report any adverse reaction to me immediately.

## 2022-04-25 ENCOUNTER — Telehealth: Payer: Medicare Other

## 2022-05-15 DIAGNOSIS — Z961 Presence of intraocular lens: Secondary | ICD-10-CM | POA: Diagnosis not present

## 2022-05-15 DIAGNOSIS — H524 Presbyopia: Secondary | ICD-10-CM | POA: Diagnosis not present

## 2022-06-03 NOTE — Progress Notes (Unsigned)
Subjective:   Chad Norris is a 88 y.o. male who presents for Medicare Annual/Subsequent preventive examination. abdominal wall  I connected with  Chad Norris on 06/03/22 by a audio enabled telemedicine application and verified that I am speaking with the correct person using two identifiers.  Patient Location: Home  Provider Location: Home Office  I discussed the limitations of evaluation and management by telemedicine. The patient expressed understanding and agreed to proceed.  Review of Systems    Deferred to PCP       Objective:    There were no vitals filed for this visit. There is no height or weight on file to calculate BMI.     08/01/2021    9:18 AM 09/26/2018    4:02 PM 08/18/2018   10:18 AM 11/26/2014    4:33 PM 07/01/2014   12:07 PM  Advanced Directives  Does Patient Have a Medical Advance Directive? Yes Yes No No Yes  Type of Estate agent of [place];Living will Living will;Healthcare Power of Attorney   Living will  Does patient want to make changes to medical advance directive? No - Patient declined      Copy of Healthcare Power of Attorney in Chart? No - copy requested      Would patient like information on creating a medical advance directive? No - Patient declined  No - Patient declined No - patient declined information     Current Medications (verified) Outpatient Encounter Medications as of 06/04/2022  Medication Sig   acetaminophen (TYLENOL) 500 MG tablet Take 1,000 mg by mouth every 6 (six) hours as needed (pain).   aspirin 81 MG EC tablet Take 1 tablet (81 mg total) by mouth daily. Swallow whole.   atorvastatin (LIPITOR) 40 MG tablet TAKE 1 TABLET(40 MG) BY MOUTH DAILY AT 6 PM   ciprofloxacin-dexamethasone (CIPRODEX) OTIC suspension Place 4 drops into the right ear 2 (two) times daily. X 7 days   mirabegron ER (MYRBETRIQ) 25 MG TB24 tablet Take 1 tablet (25 mg total) by mouth daily.   Multiple Vitamins-Minerals (ADULT ONE DAILY  GUMMIES) CHEW Chew 2 tablets by mouth daily. men   No facility-administered encounter medications on file as of 06/04/2022.    Allergies (verified) Doxycycline   History: Past Medical History:  Diagnosis Date   Colon polyps    COVID-19    Diverticulosis    Gout    Hemorrhoids    Hyperlipemia    Nephrolithiasis    Prostate cancer (HCC)    Stroke [facility])    Past Surgical History:  Procedure Laterality Date   CATARACT EXTRACTION W/ INTRAOCULAR LENS  IMPLANT, BILATERAL     bi-lat   CERVICAL SPINE SURGERY     COLONOSCOPY W/ POLYPECTOMY     Epidural steroid injections     3-4, Dr Danielle Dess   RETROPUBIC PROSTATECTOMY     SHOULDER SURGERY     left   Family History  Problem Relation Age of Onset   Kidney cancer Father    Colon cancer Father    Cancer Brother        ?   Heart attack Brother 55   Heart attack Sister 11   Breast cancer Sister    Diabetes Brother    Alcohol abuse Brother    Colon polyps Brother    Social History   Socioeconomic History   Marital status: Married    Spouse name: Not on file   Number of children: 2   Years of education:  Not on file   Highest education level: Not on file  Occupational History   Occupation: retired  Tobacco Use   Smoking status: Never   Smokeless tobacco: Never  Vaping Use   Vaping Use: Never used  Substance and Sexual Activity   Alcohol use: Yes    Alcohol/week: 1.0 standard drink of alcohol    Types: 1 Cans of beer per week   Drug use: No   Sexual activity: Not on file  Other Topics Concern   Not on file  Social History Narrative   Not on file   Social Determinants of Health   Financial Resource Strain: Low Risk  (06/06/2020)   Overall Financial Resource Strain (CARDIA)    Difficulty of Paying Living Expenses: Not hard at all  Food Insecurity: Not on file  Transportation Needs: Not on file  Physical Activity: Not on file  Stress: Not on file  Social Connections: Not on file    Tobacco  Counseling Counseling given: Not Answered   Clinical Intake:                 Diabetic?No         Activities of Daily Living     No data to display           Patient Care Team: Pincus Sanes, MD as PCP - General (Internal Medicine) Kathyrn Sheriff, [facility] as Pharmacist (Pharmacist)  Indicate any recent Medical Services you may have received from other than [facility] providers in the past year (date may be approximate).     Assessment:   This is a routine wellness examination for Chad Norris.  Hearing/Vision screen No results found.  Dietary issues and exercise activities discussed:     Goals Addressed   None   Depression Screen    01/25/2021   10:57 AM 12/10/2019    9:01 AM 10/02/2018    4:47 PM 03/11/2017   10:05 AM 05/26/2015   11:28 AM  PHQ 2/9 Scores  PHQ - 2 Score 0 0 0 0 0    Fall Risk    01/25/2021   10:57 AM 12/10/2019    9:01 AM 10/02/2018    3:02 PM 03/27/2018    8:32 AM 03/11/2017   10:05 AM  Fall Risk   Falls in the past year? 0 1 0 No No  Number falls in past yr: 0 0     Injury with Fall? 0 1     Risk for fall due to : No Fall Risks      Follow up Falls evaluation completed Falls evaluation completed       FALL RISK PREVENTION PERTAINING TO THE HOME:  Any stairs in or around the home? {YES/NO:21197} If so, are there any without handrails? {YES/NO:21197} Home free of loose throw rugs in walkways, pet beds, electrical cords, etc? {YES/NO:21197} Adequate lighting in your home to reduce risk of falls? {YES/NO:21197}  ASSISTIVE DEVICES UTILIZED TO PREVENT FALLS:  Life alert? {YES/NO:21197} Use of a cane, walker or w/c? {YES/NO:21197} Grab bars in the bathroom? {YES/NO:21197} Shower chair or bench in shower? {YES/NO:21197} Elevated toilet seat or a handicapped toilet? {YES/NO:21197}  Cognitive Function:        Immunizations Immunization History  Administered Date(s) Administered   Fluad Quad(high Dose 65+) 04/01/2019, 04/21/2020,  04/07/2021, 04/09/2022   H1N1 07/28/2008   Influenza Split 05/08/2012   Influenza Whole 04/19/2010   Influenza, High Dose Seasonal PF 05/12/2013, 04/05/2017, 04/24/2018   Influenza,inj,Quad PF,6+ Mos 04/21/2014, 04/22/2015  PFIZER(Purple Top)SARS-COV-2 Vaccination 08/01/2019, 08/22/2019, 03/25/2020, 03/29/2021   Pneumococcal Conjugate-13 05/13/2015   Pneumococcal Polysaccharide-23 03/11/2017   Tdap 06/11/2019, 03/26/2020    TDAP status: Up to date  Flu Vaccine status: Up to date  Pneumococcal vaccine status: Up to date  Covid-19 vaccine status: Information provided on how to obtain vaccines.   Qualifies for Shingles Vaccine? Yes   Zostavax completed No   Shingrix Completed?: No.    Education has been provided regarding the importance of this vaccine. Patient has been advised to call insurance company to determine out of pocket expense if they have not yet received this vaccine. Advised may also receive vaccine at local pharmacy or Health Dept. Verbalized acceptance and understanding.  Screening Tests Health Maintenance  Topic Date Due   Zoster Vaccines- Shingrix (1 of 2) Never done   COVID-19 Vaccine (5 - Pfizer risk series) 05/24/2021   Medicare Annual Wellness (AWV)  06/05/2023   TETANUS/TDAP  03/26/2030   Pneumonia Vaccine 70+ Years old  Completed   INFLUENZA VACCINE  Completed   HPV VACCINES  Aged Out    Health Maintenance  Health Maintenance Due  Topic Date Due   Zoster Vaccines- Shingrix (1 of 2) Never done   COVID-19 Vaccine (5 - Pfizer risk series) 05/24/2021    Colorectal cancer screening: No longer required.   Lung Cancer Screening: (Low Dose CT Chest recommended if Age 22-80 years, 30 pack-year currently smoking OR have quit w/in 15years.) does not qualify.   Additional Screening:  Hepatitis C Screening: does qualify; Completed education provided  Vision Screening: Recommended annual ophthalmology exams for early detection of glaucoma and other  disorders of the eye. Is the patient up to date with their annual eye exam?  Yes  Who is the provider or what is the name of the office in which the patient attends annual eye exams? Dr. Nile Riggs 05/15/22 If pt is not established with a provider, would they like to be referred to a provider to establish care?  N/A .   Dental Screening: Recommended annual dental exams for proper oral hygiene  Community Resource Referral / Chronic Care Management: CRR required this visit?  {YES/NO:21197}  CCM required this visit?  {YES/NO:21197}     Plan:     I have personally reviewed and noted the following in the patient's chart:   Medical and social history Use of alcohol, tobacco or illicit drugs  Current medications and supplements including opioid prescriptions. Patient is not currently taking opioid prescriptions. Functional ability and status Nutritional status Physical activity Advanced directives List of other physicians Hospitalizations, surgeries, and ER visits in previous 12 months Vitals Screenings to include cognitive, depression, and falls Referrals and appointments  In addition, I have reviewed and discussed with patient certain preventive protocols, quality metrics, and best practice recommendations. A written personalized care plan for preventive services as well as general preventive health recommendations were provided to patient.     Wanda Plump, RN   06/03/2022   Nurse Notes:  Chad Norris , Thank you for taking time to come for your Medicare Wellness Visit. I appreciate your ongoing commitment to your health goals. Please review the following plan we discussed and let me know if I can assist you in the future.   These are the goals we discussed:  Goals      Manage My Medicine     Timeframe:  Long-Range Goal Priority:  High Start Date:    05/15/21  Expected End Date:      05/15/22                 Follow Up Date April 2023   - call for  medicine refill 2 or 3 days before it runs out - call if I am sick and can't take my medicine - keep a list of all the medicines I take; vitamins and herbals too  -Contact provider if there is trouble with refills in future -Restart atorvastatin 40 mg daily -Start aspirin 81 mg daily   Why is this important?   These steps will help you keep on track with your medicines.   Notes:         This is a list of the screening recommended for you and due dates:  Health Maintenance  Topic Date Due   Zoster (Shingles) Vaccine (1 of 2) Never done   COVID-19 Vaccine (5 - Pfizer risk series) 05/24/2021   Medicare Annual Wellness Visit  06/05/2023   Tetanus Vaccine  03/26/2030   Pneumonia Vaccine  Completed   Flu Shot  Completed   HPV Vaccine  Aged Out

## 2022-06-03 NOTE — Patient Instructions (Signed)
Health Maintenance, Male Adopting a healthy lifestyle and getting preventive care are important in promoting health and wellness. Ask your health care provider about: The right schedule for you to have regular tests and exams. Things you can do on your own to prevent diseases and keep yourself healthy. What should I know about diet, weight, and exercise? Eat a healthy diet  Eat a diet that includes plenty of vegetables, fruits, low-fat dairy products, and lean protein. Do not eat a lot of foods that are high in solid fats, added sugars, or sodium. Maintain a healthy weight Body mass index (BMI) is a measurement that can be used to identify possible weight problems. It estimates body fat based on height and weight. Your health care provider can help determine your BMI and help you achieve or maintain a healthy weight. Get regular exercise Get regular exercise. This is one of the most important things you can do for your health. Most adults should: Exercise for at least 150 minutes each week. The exercise should increase your heart rate and make you sweat (moderate-intensity exercise). Do strengthening exercises at least twice a week. This is in addition to the moderate-intensity exercise. Spend less time sitting. Even light physical activity can be beneficial. Watch cholesterol and blood lipids Have your blood tested for lipids and cholesterol at 86 years of age, then have this test every 5 years. You may need to have your cholesterol levels checked more often if: Your lipid or cholesterol levels are high. You are older than 86 years of age. You are at high risk for heart disease. What should I know about cancer screening? Many types of cancers can be detected early and may often be prevented. Depending on your health history and family history, you may need to have cancer screening at various ages. This may include screening for: Colorectal cancer. Prostate cancer. Skin cancer. Lung  cancer. What should I know about heart disease, diabetes, and high blood pressure? Blood pressure and heart disease High blood pressure causes heart disease and increases the risk of stroke. This is more likely to develop in people who have high blood pressure readings or are overweight. Talk with your health care provider about your target blood pressure readings. Have your blood pressure checked: Every 3-5 years if you are 18-39 years of age. Every year if you are 40 years old or older. If you are between the ages of 65 and 75 and are a current or former smoker, ask your health care provider if you should have a one-time screening for abdominal aortic aneurysm (AAA). Diabetes Have regular diabetes screenings. This checks your fasting blood sugar level. Have the screening done: Once every three years after age 45 if you are at a normal weight and have a low risk for diabetes. More often and at a younger age if you are overweight or have a high risk for diabetes. What should I know about preventing infection? Hepatitis B If you have a higher risk for hepatitis B, you should be screened for this virus. Talk with your health care provider to find out if you are at risk for hepatitis B infection. Hepatitis C Blood testing is recommended for: Everyone born from 1945 through 1965. Anyone with known risk factors for hepatitis C. Sexually transmitted infections (STIs) You should be screened each year for STIs, including gonorrhea and chlamydia, if: You are sexually active and are younger than 86 years of age. You are older than 86 years of age and your   health care provider tells you that you are at risk for this type of infection. Your sexual activity has changed since you were last screened, and you are at increased risk for chlamydia or gonorrhea. Ask your health care provider if you are at risk. Ask your health care provider about whether you are at high risk for HIV. Your health care provider  may recommend a prescription medicine to help prevent HIV infection. If you choose to take medicine to prevent HIV, you should first get tested for HIV. You should then be tested every 3 months for as long as you are taking the medicine. Follow these instructions at home: Alcohol use Do not drink alcohol if your health care provider tells you not to drink. If you drink alcohol: Limit how much you have to 0-2 drinks a day. Know how much alcohol is in your drink. In the [place] one drink equals one 12 oz bottle of beer (355 mL), one 5 oz glass of Camdin Hegner (148 mL), or one 1 oz glass of hard liquor (44 mL). Lifestyle Do not use any products that contain nicotine or tobacco. These products include cigarettes, chewing tobacco, and vaping devices, such as e-cigarettes. If you need help quitting, ask your health care provider. Do not use street drugs. Do not share needles. Ask your health care provider for help if you need support or information about quitting drugs. General instructions Schedule regular health, dental, and eye exams. Stay current with your vaccines. Tell your health care provider if: You often feel depressed. You have ever been abused or do not feel safe at home. Summary Adopting a healthy lifestyle and getting preventive care are important in promoting health and wellness. Follow your health care provider's instructions about healthy diet, exercising, and getting tested or screened for diseases. Follow your health care provider's instructions on monitoring your cholesterol and blood pressure. This information is not intended to replace advice given to you by your health care provider. Make sure you discuss any questions you have with your health care provider. Document Revised: 11/28/2020 Document Reviewed: 11/28/2020 Elsevier Patient Education  2023 Else[employer] Inc.  

## 2022-06-04 ENCOUNTER — Ambulatory Visit: Payer: Medicare Other | Admitting: *Deleted

## 2022-06-04 DIAGNOSIS — Z0000 Encounter for general adult medical examination without abnormal findings: Secondary | ICD-10-CM

## 2022-06-05 ENCOUNTER — Encounter: Payer: Self-pay | Admitting: Internal Medicine

## 2022-06-05 NOTE — Patient Instructions (Addendum)
Blood work was ordered.   The lab is on the first floor.    Medications changes include :       A referral was ordered for XXX.     Someone will call you to schedule an appointment.    Return in about 1 year (around 06/07/2023) for Physical Exam.   Health Maintenance, Male Adopting a healthy lifestyle and getting preventive care are important in promoting health and wellness. Ask your health care provider about: The right schedule for you to have regular tests and exams. Things you can do on your own to prevent diseases and keep yourself healthy. What should I know about diet, weight, and exercise? Eat a healthy diet  Eat a diet that includes plenty of vegetables, fruits, low-fat dairy products, and lean protein. Do not eat a lot of foods that are high in solid fats, added sugars, or sodium. Maintain a healthy weight Body mass index (BMI) is a measurement that can be used to identify possible weight problems. It estimates body fat based on height and weight. Your health care provider can help determine your BMI and help you achieve or maintain a healthy weight. Get regular exercise Get regular exercise. This is one of the most important things you can do for your health. Most adults should: Exercise for at least 150 minutes each week. The exercise should increase your heart rate and make you sweat (moderate-intensity exercise). Do strengthening exercises at least twice a week. This is in addition to the moderate-intensity exercise. Spend less time sitting. Even light physical activity can be beneficial. Watch cholesterol and blood lipids Have your blood tested for lipids and cholesterol at 86 years of age, then have this test every 5 years. You may need to have your cholesterol levels checked more often if: Your lipid or cholesterol levels are high. You are older than 86 years of age. You are at high risk for heart disease. What should I know about cancer  screening? Many types of cancers can be detected early and may often be prevented. Depending on your health history and family history, you may need to have cancer screening at various ages. This may include screening for: Colorectal cancer. Prostate cancer. Skin cancer. Lung cancer. What should I know about heart disease, diabetes, and high blood pressure? Blood pressure and heart disease High blood pressure causes heart disease and increases the risk of stroke. This is more likely to develop in people who have high blood pressure readings or are overweight. Talk with your health care provider about your target blood pressure readings. Have your blood pressure checked: Every 3-5 years if you are 78-31 years of age. Every year if you are 66 years old or older. If you are between the ages of 4 and 23 and are a current or former smoker, ask your health care provider if you should have a one-time screening for abdominal aortic aneurysm (AAA). Diabetes Have regular diabetes screenings. This checks your fasting blood sugar level. Have the screening done: Once every three years after age 73 if you are at a normal weight and have a low risk for diabetes. More often and at a younger age if you are overweight or have a high risk for diabetes. What should I know about preventing infection? Hepatitis B If you have a higher risk for hepatitis B, you should be screened for this virus. Talk with your health care provider to find out if you are at risk for  hepatitis B infection. Hepatitis C Blood testing is recommended for: Everyone born from [birth year] through 1965. Anyone with known risk factors for hepatitis C. Sexually transmitted infections (STIs) You should be screened each year for STIs, including gonorrhea and chlamydia, if: You are sexually active and are younger than 86 years of age. You are older than 86 years of age and your health care provider tells you that you are at risk for this type of  infection. Your sexual activity has changed since you were last screened, and you are at increased risk for chlamydia or gonorrhea. Ask your health care provider if you are at risk. Ask your health care provider about whether you are at high risk for HIV. Your health care provider may recommend a prescription medicine to help prevent HIV infection. If you choose to take medicine to prevent HIV, you should first get tested for HIV. You should then be tested every 3 months for as long as you are taking the medicine. Follow these instructions at home: Alcohol use Do not drink alcohol if your health care provider tells you not to drink. If you drink alcohol: Limit how much you have to 0-2 drinks a day. Know how much alcohol is in your drink. In the [place] one drink equals one 12 oz bottle of beer (355 mL), one 5 oz glass of wine (148 mL), or one 1 oz glass of hard liquor (44 mL). Lifestyle Do not use any products that contain nicotine or tobacco. These products include cigarettes, chewing tobacco, and vaping devices, such as e-cigarettes. If you need help quitting, ask your health care provider. Do not use street drugs. Do not share needles. Ask your health care provider for help if you need support or information about quitting drugs. General instructions Schedule regular health, dental, and eye exams. Stay current with your vaccines. Tell your health care provider if: You often feel depressed. You have ever been abused or do not feel safe at home. Summary Adopting a healthy lifestyle and getting preventive care are important in promoting health and wellness. Follow your health care provider's instructions about healthy diet, exercising, and getting tested or screened for diseases. Follow your health care provider's instructions on monitoring your cholesterol and blood pressure. This information is not intended to replace advice given to you by your health care provider. Make sure you discuss  any questions you have with your health care provider. Document Revised: 11/28/2020 Document Reviewed: 11/28/2020 Elsevier Patient Education  [place].

## 2022-06-05 NOTE — Assessment & Plan Note (Signed)
Chronic Last saw oncology 07/2021 -stable, no evidence of cancer I will monitor and refer back to hem/onc prn cbc

## 2022-06-05 NOTE — Progress Notes (Unsigned)
Subjective:    Patient ID: Chad Norris, male    DOB: August 02, 1931, 88 y.o.   MRN: [id number]      HPI Chad Norris is here for a Physical exam.   Left leg feels weak at times and sometimes almost gives way.  He has constant lower back pain.  He has left knee pain.  He is not exercising regularly.   Medications and allergies reviewed with patient and updated if appropriate.  Current Outpatient Medications on File Prior to Visit  Medication Sig Dispense Refill   acetaminophen (TYLENOL) 500 MG tablet Take 1,000 mg by mouth every 6 (six) hours as needed (pain).     aspirin 81 MG EC tablet Take 1 tablet (81 mg total) by mouth daily. Swallow whole. 30 tablet 12   Multiple Vitamins-Minerals (ADULT ONE DAILY GUMMIES) CHEW Chew 2 tablets by mouth daily. men     No current facility-administered medications on file prior to visit.    Review of Systems  Constitutional:  Negative for chills and fever.  Eyes:  Negative for visual disturbance.  Respiratory:  Negative for cough, shortness of breath and wheezing.   Cardiovascular:  Negative for chest pain, palpitations and leg swelling.  Gastrointestinal:  Positive for constipation (chronic - taking metamucil). Negative for abdominal pain, blood in stool, diarrhea and nausea.       Rare gerd with certain foods  Genitourinary:  Positive for frequency. Negative for dysuria.  Musculoskeletal:  Positive for arthralgias (left knee, hands) and back pain (chronic lower back).  Skin:  Negative for rash.  Neurological:  Negative for light-headedness and headaches.  Psychiatric/Behavioral:  Negative for dysphoric mood. The patient is not nervous/anxious.        Objective:   Vitals:   06/06/22 0928  BP: 110/70  Pulse: (!) 51  Temp: 98.3 F (36.8 C)  SpO2: 99%   Filed Weights   06/06/22 0928  Weight: 195 lb (88.5 kg)   Body mass index is 26.45 kg/m.  BP Readings from Last 3 Encounters:  06/06/22 110/70  02/05/22 122/73  08/01/21 112/61     Wt Readings from Last 3 Encounters:  06/06/22 195 lb (88.5 kg)  08/01/21 194 lb (88 kg)  06/05/21 198 lb (89.8 kg)       Physical Exam Constitutional:      General: He is not in acute distress.    Appearance: Normal appearance. He is not ill-appearing.  HENT:     Head: Normocephalic and atraumatic.     Right Ear: External ear normal.     Left Ear: External ear normal.     Mouth/Throat:     Mouth: Mucous membranes are moist.     Pharynx: No posterior oropharyngeal erythema.  Eyes:     Conjunctiva/sclera: Conjunctivae normal.  Neck:     Vascular: No carotid bruit.  Cardiovascular:     Rate and Rhythm: Normal rate and regular rhythm.     Heart sounds: Normal heart sounds. No murmur heard. Pulmonary:     Effort: Pulmonary effort is normal. No respiratory distress.     Breath sounds: Normal breath sounds. No wheezing or rales.  Abdominal:     General: There is no distension.     Palpations: Abdomen is soft.     Tenderness: There is no abdominal tenderness. There is no guarding or rebound.  Musculoskeletal:     Cervical back: Neck supple.     Right lower leg: No edema.     Left lower leg:  No edema.  Lymphadenopathy:     Cervical: No cervical adenopathy.  Skin:    General: Skin is warm and dry.     Findings: No rash.  Neurological:     Mental Status: He is alert. Mental status is at baseline.  Psychiatric:        Mood and Affect: Mood normal.      Lab Results  Component Value Date   WBC 5.4 08/01/2021   HGB 12.6 (L) 08/01/2021   HCT 37.5 (L) 08/01/2021   PLT 147 (L) 08/01/2021   GLUCOSE 100 (H) 08/01/2021   CHOL 125 06/05/2021   TRIG 167.0 (H) 06/05/2021   HDL 39.90 06/05/2021   LDLDIRECT 122.0 03/27/2018   LDLCALC 51 06/05/2021   ALT 13 08/01/2021   AST 17 08/01/2021   NA 139 08/01/2021   K 4.4 08/01/2021   CL 105 08/01/2021   CREATININE 1.22 08/01/2021   BUN 22 08/01/2021   CO2 28 08/01/2021   TSH 1.81 06/05/2021   PSA 0.01 (L) 01/13/2014   INR  1.06 08/18/2018   HGBA1C 5.2 06/05/2021         Assessment & Plan:   Physical exam: Screening blood work  ordered Exercise  walks dog - encouraged increased exercise Weight   is good Substance abuse  none   Reviewed recommended immunizations.      Health Maintenance  Topic Date Due   Medicare Annual Wellness (AWV)  Never done   COVID-19 Vaccine (5 - Pfizer risk series) 06/22/2022 (Originally 05/24/2021)   Zoster Vaccines- Shingrix (1 of 2) 09/06/2022 (Originally 12/20/1950)   TETANUS/TDAP  03/26/2030   Pneumonia Vaccine 2+ Years old  Completed   INFLUENZA VACCINE  Completed   HPV VACCINES  Aged Out          See Problem List for Assessment and Plan of chronic medical problems.

## 2022-06-06 ENCOUNTER — Ambulatory Visit (INDEPENDENT_AMBULATORY_CARE_PROVIDER_SITE_OTHER): Payer: Medicare Other | Admitting: Internal Medicine

## 2022-06-06 VITALS — BP 110/70 | HR 51 | Temp 98.3°F | Ht 72.0 in | Wt 195.0 lb

## 2022-06-06 DIAGNOSIS — Z0000 Encounter for general adult medical examination without abnormal findings: Secondary | ICD-10-CM | POA: Diagnosis not present

## 2022-06-06 DIAGNOSIS — R350 Frequency of micturition: Secondary | ICD-10-CM

## 2022-06-06 DIAGNOSIS — N3281 Overactive bladder: Secondary | ICD-10-CM

## 2022-06-06 DIAGNOSIS — R7303 Prediabetes: Secondary | ICD-10-CM

## 2022-06-06 DIAGNOSIS — D696 Thrombocytopenia, unspecified: Secondary | ICD-10-CM

## 2022-06-06 DIAGNOSIS — Z8673 Personal history of transient ischemic attack (TIA), and cerebral infarction without residual deficits: Secondary | ICD-10-CM

## 2022-06-06 DIAGNOSIS — E7849 Other hyperlipidemia: Secondary | ICD-10-CM | POA: Diagnosis not present

## 2022-06-06 LAB — LIPID PANEL
Cholesterol: 195 mg/dL (ref 0–200)
HDL: 34.9 mg/dL — ABNORMAL LOW (ref >39.00)
LDL Cholesterol: 126 mg/dL — ABNORMAL HIGH (ref 0–99)
NonHDL: 160.41
Total CHOL/HDL Ratio: 6
Triglycerides: 170 mg/dL — ABNORMAL HIGH (ref 0.0–149.0)
VLDL: 34 mg/dL (ref 0.0–40.0)

## 2022-06-06 LAB — CBC WITH DIFFERENTIAL/PLATELET
Basophils Absolute: 0 10*3/uL (ref 0.0–0.1)
Basophils Relative: 0.8 % (ref 0.0–3.0)
Eosinophils Absolute: 0.1 10*3/uL (ref 0.0–0.7)
Eosinophils Relative: 1.2 % (ref 0.0–5.0)
HCT: 38.6 % — ABNORMAL LOW (ref 39.0–52.0)
Hemoglobin: 13.1 g/dL (ref 13.0–17.0)
Lymphocytes Relative: 49.5 % — ABNORMAL HIGH (ref 12.0–46.0)
Lymphs Abs: 2.3 10*3/uL (ref 0.7–4.0)
MCHC: 34 g/dL (ref 30.0–36.0)
MCV: 96.1 fl (ref 78.0–100.0)
Monocytes Absolute: 0.7 10*3/uL (ref 0.1–1.0)
Monocytes Relative: 16.2 % — ABNORMAL HIGH (ref 3.0–12.0)
Neutro Abs: 1.5 10*3/uL (ref 1.4–7.7)
Neutrophils Relative %: 32.3 % — ABNORMAL LOW (ref 43.0–77.0)
Platelets: 94 10*3/uL — ABNORMAL LOW (ref 150.0–400.0)
RBC: 4.02 Mil/uL — ABNORMAL LOW (ref 4.22–5.81)
RDW: 13.9 % (ref 11.5–15.5)
WBC: 4.6 10*3/uL (ref 4.0–10.5)

## 2022-06-06 LAB — URINALYSIS, ROUTINE W REFLEX MICROSCOPIC
Bilirubin Urine: NEGATIVE
Hgb urine dipstick: NEGATIVE
Ketones, ur: NEGATIVE
Leukocytes,Ua: NEGATIVE
Nitrite: NEGATIVE
RBC / HPF: NONE SEEN (ref 0–?)
Specific Gravity, Urine: 1.015 (ref 1.000–1.030)
Total Protein, Urine: NEGATIVE
Urine Glucose: NEGATIVE
Urobilinogen, UA: 0.2 (ref 0.0–1.0)
pH: 6 (ref 5.0–8.0)

## 2022-06-06 LAB — COMPREHENSIVE METABOLIC PANEL
ALT: 12 U/L (ref 0–53)
AST: 18 U/L (ref 0–37)
Albumin: 4.5 g/dL (ref 3.5–5.2)
Alkaline Phosphatase: 37 U/L — ABNORMAL LOW (ref 39–117)
BUN: 21 mg/dL (ref 6–23)
CO2: 28 mEq/L (ref 19–32)
Calcium: 9.1 mg/dL (ref 8.4–10.5)
Chloride: 102 mEq/L (ref 96–112)
Creatinine, Ser: 1.2 mg/dL (ref 0.40–1.50)
GFR: 54.01 mL/min — ABNORMAL LOW (ref >60.00)
Glucose, Bld: 90 mg/dL (ref 70–99)
Potassium: 4.2 mEq/L (ref 3.5–5.1)
Sodium: 136 mEq/L (ref 135–145)
Total Bilirubin: 0.8 mg/dL (ref 0.2–1.2)
Total Protein: 7.3 g/dL (ref 6.0–8.3)

## 2022-06-06 LAB — HEMOGLOBIN A1C: Hgb A1c MFr Bld: 5.1 % (ref 4.6–6.5)

## 2022-06-06 MED ORDER — ATORVASTATIN CALCIUM 40 MG PO TABS: 40.0000 mg | ORAL_TABLET | Freq: Every day | ORAL | 3 refills | Status: DC

## 2022-06-06 NOTE — Assessment & Plan Note (Addendum)
Chronic Tried myrbetriq - it did not help he has increased frequency at night-he can live with this

## 2022-06-06 NOTE — Assessment & Plan Note (Signed)
Chronic Regular exercise and healthy diet encouraged Check lipid panel, CMP Continue atorvastatin 40 mg daily

## 2022-06-06 NOTE — Assessment & Plan Note (Signed)
He is experiencing some urine frequency likely normal and possibly related to overactive bladder Check urinalysis, urine culture

## 2022-06-06 NOTE — Assessment & Plan Note (Signed)
Chronic Check a1c Low sugar / carb diet Stressed regular exercise  

## 2022-06-06 NOTE — Assessment & Plan Note (Signed)
Chronic-history of CVA No residual Continue atorvastatin 40 mg daily, aspirin 81 mg daily Blood pressure well controlled Check CBC, CMP, lipid

## 2022-06-08 LAB — URINE CULTURE: Result:: NO GROWTH

## 2022-06-09 NOTE — Addendum Note (Signed)
Addended by: Binnie Rail on: 06/09/2022 02:42 PM   Modules accepted: Orders

## 2022-06-21 ENCOUNTER — Ambulatory Visit
Admission: EM | Admit: 2022-06-21 | Discharge: 2022-06-21 | Disposition: A | Discharge status: Home / Self Care | Payer: Medicare Other | Attending: Urgent Care | Admitting: Urgent Care

## 2022-06-21 ENCOUNTER — Ambulatory Visit (INDEPENDENT_AMBULATORY_CARE_PROVIDER_SITE_OTHER): Payer: Medicare Other

## 2022-06-21 DIAGNOSIS — J069 Acute upper respiratory infection, unspecified: Secondary | ICD-10-CM | POA: Diagnosis not present

## 2022-06-21 DIAGNOSIS — B349 Viral infection, unspecified: Secondary | ICD-10-CM | POA: Diagnosis not present

## 2022-06-21 DIAGNOSIS — R059 Cough, unspecified: Secondary | ICD-10-CM

## 2022-06-21 DIAGNOSIS — Z1152 Encounter for screening for COVID-19: Secondary | ICD-10-CM | POA: Insufficient documentation

## 2022-06-21 LAB — RESP PANEL BY RT-PCR (FLU A&B, COVID) ARPGX2
Influenza A by PCR: NEGATIVE
Influenza B by PCR: NEGATIVE
SARS Coronavirus 2 by RT PCR: NEGATIVE

## 2022-06-21 MED ORDER — ACETAMINOPHEN 325 MG PO TABS: 650.0000 mg | ORAL_TABLET | Freq: Four times a day (QID) | ORAL | 0 refills | Status: AC | PRN

## 2022-06-21 MED ORDER — CETIRIZINE HCL 1 MG/ML PO SOLN: 5.0000 mg | Freq: Every day | ORAL | 0 refills | Status: DC

## 2022-06-21 MED ORDER — BENZONATATE 100 MG PO CAPS: 100.0000 mg | ORAL_CAPSULE | Freq: Three times a day (TID) | ORAL | 0 refills | Status: DC | PRN

## 2022-06-21 NOTE — ED Provider Notes (Signed)
[facility]  Note:  This document was prepared using Systems analyst and may include unintentional dictation errors.  MRN: [id number] DOB: 07/29/1931  Subjective:   Chad Norris is a 88 y.o. male presenting for 3-day history of acute onset persistent runny nose, drainage, coughing.  Patient has a history of COVID-19 and pneumonia.  No history of respiratory disorders otherwise.  Patient only takes aspirin and cholesterol medication.  No smoking, vaping, marijuana use.  No current facility-administered medications for this encounter.  Current Outpatient Medications:    acetaminophen (TYLENOL) 500 MG tablet, Take 1,000 mg by mouth every 6 (six) hours as needed (pain)., Disp: , Rfl:    aspirin 81 MG EC tablet, Take 1 tablet (81 mg total) by mouth daily. Swallow whole., Disp: 30 tablet, Rfl: 12   atorvastatin (LIPITOR) 40 MG tablet, Take 1 tablet (40 mg total) by mouth daily., Disp: 90 tablet, Rfl: 3   Multiple Vitamins-Minerals (ADULT ONE DAILY GUMMIES) CHEW, Chew 2 tablets by mouth daily. men, Disp: , Rfl:    Allergies  Allergen Reactions   Doxycycline Rash    Past Medical History:  Diagnosis Date   Colon polyps    COVID-19    Diverticulosis    Gout    Hemorrhoids    Hyperlipemia    Nephrolithiasis    Prostate cancer ([place])    [facility]      Past Surgical History:  Procedure Laterality Date   CATARACT EXTRACTION W/ INTRAOCULAR LENS  IMPLANT, BILATERAL     bi-lat   CERVICAL SPINE SURGERY     COLONOSCOPY W/ POLYPECTOMY     Epidural steroid injections     3-4, Dr Ellene Route   RETROPUBIC PROSTATECTOMY     SHOULDER SURGERY     left    Family History  Problem Relation Age of Onset   Kidney cancer Father    Colon cancer Father    Cancer Brother        ?   Heart attack Brother 32   Heart attack Sister 33   Breast cancer Sister    Diabetes Brother    Alcohol abuse Brother    Colon polyps Brother     Social History    Tobacco Use   Smoking status: Never   Smokeless tobacco: Never  Vaping Use   Vaping Use: Never used  Substance Use Topics   Alcohol use: Not Currently    Alcohol/week: 1.0 standard drink of alcohol    Types: 1 Cans of beer per week   Drug use: No    ROS   Objective:   Vitals: BP 117/77 (BP Location: Right Arm)   Pulse 93   Temp 99.6 F (37.6 C) (Oral)   Resp 20   SpO2 98%   Physical Exam Constitutional:      General: He is not in acute distress.    Appearance: Normal appearance. He is well-developed and normal weight. He is not ill-appearing, toxic-appearing or diaphoretic.  HENT:     Head: Normocephalic and atraumatic.     Right Ear: Tympanic membrane, ear canal and external ear normal. No drainage, swelling or tenderness. No middle ear effusion. There is no impacted cerumen. Tympanic membrane is not erythematous or bulging.     Left Ear: Tympanic membrane, ear canal and external ear normal. No drainage, swelling or tenderness.  No middle ear effusion. There is no impacted cerumen. Tympanic membrane is not erythematous or bulging.     Nose: Congestion  present. No rhinorrhea.     Mouth/Throat:     Mouth: Mucous membranes are moist.     Pharynx: No oropharyngeal exudate or posterior oropharyngeal erythema.     Comments: Post-nasal drainage overlying pharynx. Eyes:     General: No scleral icterus.       Right eye: No discharge.        Left eye: No discharge.     Extraocular Movements: Extraocular movements intact.     Conjunctiva/sclera: Conjunctivae normal.  Cardiovascular:     Rate and Rhythm: Normal rate and regular rhythm.     Heart sounds: Normal heart sounds. No murmur heard.    No friction rub. No gallop.  Pulmonary:     Effort: Pulmonary effort is normal. No respiratory distress.     Breath sounds: Normal breath sounds. No stridor. No wheezing, rhonchi or rales.  Musculoskeletal:     Cervical back: Normal range of motion and neck supple. No rigidity. No  muscular tenderness.  Neurological:     General: No focal deficit present.     Mental Status: He is alert and oriented to person, place, and time.  Psychiatric:        Mood and Affect: Mood normal.        Behavior: Behavior normal.        Thought Content: Thought content normal.     DG Chest 2 View  Result Date: 06/21/2022 CLINICAL DATA:  Cough and runny nose. EXAM: CHEST - 2 VIEW COMPARISON:  12/10/2019 FINDINGS: The heart size is normal. The aorta demonstrates stable mild tortuosity. There is no evidence of pulmonary edema, consolidation, pneumothorax, nodule or pleural fluid. The visualized skeletal structures are unremarkable. IMPRESSION: No active cardiopulmonary disease. Electronically Signed   By: Aletta Edouard M.D.   On: 06/21/2022 15:43     Assessment and Plan :   PDMP not reviewed this encounter.  1. Acute viral syndrome     Negative chest x-ray. Will manage for viral illness such as viral URI, viral syndrome, viral rhinitis, COVID-19. Recommended supportive care. Offered scripts for symptomatic relief. Testing is pending. Counseled patient on potential for adverse effects with medications prescribed/recommended today, ER and return-to-clinic precautions discussed, patient verbalized understanding.   Creatinine clearance was 53 mL/min, GFR was 54 mL/min this month.  The patient is positive.  The good candidate to undergo Paxlovid at the renal dosing.   Jaynee Eagles, [place] 06/21/22 1607

## 2022-06-21 NOTE — ED Triage Notes (Signed)
Pt c/o cough, runny nose x 3 days-NAD-steady gait

## 2022-06-21 NOTE — Discharge Instructions (Signed)
We will notify you of your test results as they arrive and may take between about 24 hours.  I encourage you to sign up for MyChart if you have not already done so as this can be the easiest way for us to communicate results to you online or through a phone app.  Generally, we only contact you if it is a positive test result.  In the meantime, if you develop worsening symptoms including fever, chest pain, shortness of breath despite our current treatment plan then please report to the emergency room as this may be a sign of worsening status from possible viral infection.  Otherwise, we will manage this as a viral syndrome. For sore throat or cough try using a honey-based tea. Use 3 teaspoons of honey with juice squeezed from half lemon. Place shaved pieces of ginger into 1/2-1 cup of water and warm over stove top. Then mix the ingredients and repeat every 4 hours as needed. Please take Tylenol 500mg-650mg every 6 hours for aches and pains, fevers. Hydrate very well with at least 2 liters of water. Eat light meals such as soups to replenish electrolytes and soft fruits, veggies. Start an antihistamine like Zyrtec for postnasal drainage, sinus congestion.   

## 2022-06-28 DIAGNOSIS — L820 Inflamed seborrheic keratosis: Secondary | ICD-10-CM | POA: Diagnosis not present

## 2022-09-10 ENCOUNTER — Other Ambulatory Visit (INDEPENDENT_AMBULATORY_CARE_PROVIDER_SITE_OTHER): Payer: Medicare Other

## 2022-09-10 DIAGNOSIS — D696 Thrombocytopenia, unspecified: Secondary | ICD-10-CM

## 2022-09-10 LAB — CBC WITH DIFFERENTIAL/PLATELET
Basophils Absolute: 0 10*3/uL (ref 0.0–0.1)
Basophils Relative: 0.7 % (ref 0.0–3.0)
Eosinophils Absolute: 0.1 10*3/uL (ref 0.0–0.7)
Eosinophils Relative: 1.3 % (ref 0.0–5.0)
HCT: 39.6 % (ref 39.0–52.0)
Hemoglobin: 13.6 g/dL (ref 13.0–17.0)
Lymphocytes Relative: 43.5 % (ref 12.0–46.0)
Lymphs Abs: 2.4 10*3/uL (ref 0.7–4.0)
MCHC: 34.4 g/dL (ref 30.0–36.0)
MCV: 94.2 fl (ref 78.0–100.0)
Monocytes Absolute: 0.7 10*3/uL (ref 0.1–1.0)
Monocytes Relative: 13.2 % — ABNORMAL HIGH (ref 3.0–12.0)
Neutro Abs: 2.3 10*3/uL (ref 1.4–7.7)
Neutrophils Relative %: 41.3 % — ABNORMAL LOW (ref 43.0–77.0)
Platelets: 79 10*3/uL — ABNORMAL LOW (ref 150.0–400.0)
RBC: 4.21 Mil/uL — ABNORMAL LOW (ref 4.22–5.81)
RDW: 14.8 % (ref 11.5–15.5)
WBC: 5.6 10*3/uL (ref 4.0–10.5)

## 2022-09-17 ENCOUNTER — Other Ambulatory Visit: Payer: Self-pay

## 2022-09-17 ENCOUNTER — Telehealth: Payer: Self-pay | Admitting: Internal Medicine

## 2022-09-17 DIAGNOSIS — E7849 Other hyperlipidemia: Secondary | ICD-10-CM

## 2022-09-17 MED ORDER — ATORVASTATIN CALCIUM 40 MG PO TABS: 40.0000 mg | ORAL_TABLET | Freq: Every day | ORAL | 0 refills | Status: DC

## 2022-09-17 NOTE — Telephone Encounter (Signed)
Spoke with patient and script sent in today.

## 2022-09-17 NOTE — Telephone Encounter (Signed)
Soni a FNP with [employer] saw the pt today and there was some confusion about his medications during his appointment.   Pt's med list has atorvastatin (LIPITOR), but pt has not been taking this medication and states he was unaware he needed any meds for his cholesterol.   Please call pt and advise on medications.  [phone]

## 2022-10-28 NOTE — Progress Notes (Unsigned)
    Subjective:    Patient ID: Chad Norris, male    DOB: 1932/06/30, 89 y.o.   MRN: [id number]      HPI Jakori is here for No chief complaint on file.   Thrombocytopenia - anemia and thrombocytopenia stared 02/2021.  No longer anemic.  Platelet count still low - 09/10/22 79,000.      Latest Ref Rng & Units 09/10/2022   10:05 AM 06/06/2022   10:25 AM 08/01/2021    8:33 AM  CBC  WBC 4.0 - 10.5 K/uL 5.6  4.6  5.4   Hemoglobin 13.0 - 17.0 g/dL 86.7  67.2  09.4   Hematocrit 39.0 - 52.0 % 39.6  38.6  37.5   Platelets 150.0 - 400.0 K/uL 79.0  94.0  147      Saw hem/onc  08/01/21 - no evidence of malignancy.  Advised to f/u as needed.   Denies abnormal bleeding/bruising.     Medications and allergies reviewed with patient and updated if appropriate.  Current Outpatient Medications on File Prior to Visit  Medication Sig Dispense Refill   acetaminophen (TYLENOL) 325 MG tablet Take 2 tablets (650 mg total) by mouth every 6 (six) hours as needed for moderate pain. 30 tablet 0   aspirin 81 MG EC tablet Take 1 tablet (81 mg total) by mouth daily. Swallow whole. 30 tablet 12   atorvastatin (LIPITOR) 40 MG tablet Take 1 tablet (40 mg total) by mouth daily. 90 tablet 0   Multiple Vitamins-Minerals (ADULT ONE DAILY GUMMIES) CHEW Chew 2 tablets by mouth daily. men     No current facility-administered medications on file prior to visit.    Review of Systems     Objective:  There were no vitals filed for this visit. BP Readings from Last 3 Encounters:  06/21/22 117/77  06/06/22 110/70  02/05/22 122/73   Wt Readings from Last 3 Encounters:  06/06/22 195 lb (88.5 kg)  08/01/21 194 lb (88 kg)  06/05/21 198 lb (89.8 kg)   There is no height or weight on file to calculate BMI.    Physical Exam         Assessment & Plan:    See Problem List for Assessment and Plan of chronic medical problems.

## 2022-10-29 ENCOUNTER — Ambulatory Visit (INDEPENDENT_AMBULATORY_CARE_PROVIDER_SITE_OTHER): Payer: Medicare Other | Admitting: Internal Medicine

## 2022-10-29 ENCOUNTER — Encounter: Payer: Self-pay | Admitting: Internal Medicine

## 2022-10-29 VITALS — BP 124/80 | HR 50 | Temp 98.3°F | Ht 72.0 in | Wt 191.0 lb

## 2022-10-29 DIAGNOSIS — L03116 Cellulitis of left lower limb: Secondary | ICD-10-CM | POA: Insufficient documentation

## 2022-10-29 DIAGNOSIS — D696 Thrombocytopenia, unspecified: Secondary | ICD-10-CM | POA: Diagnosis not present

## 2022-10-29 DIAGNOSIS — M79605 Pain in left leg: Secondary | ICD-10-CM

## 2022-10-29 DIAGNOSIS — W57XXXA Bitten or stung by nonvenomous insect and other nonvenomous arthropods, initial encounter: Secondary | ICD-10-CM | POA: Diagnosis not present

## 2022-10-29 DIAGNOSIS — S80862A Insect bite (nonvenomous), left lower leg, initial encounter: Secondary | ICD-10-CM | POA: Insufficient documentation

## 2022-10-29 DIAGNOSIS — Z8673 Personal history of transient ischemic attack (TIA), and cerebral infarction without residual deficits: Secondary | ICD-10-CM | POA: Diagnosis not present

## 2022-10-29 LAB — CBC WITH DIFFERENTIAL/PLATELET
Basophils Absolute: 0 10*3/uL (ref 0.0–0.1)
Basophils Relative: 0.7 % (ref 0.0–3.0)
Eosinophils Absolute: 0.1 10*3/uL (ref 0.0–0.7)
Eosinophils Relative: 1.4 % (ref 0.0–5.0)
HCT: 37.9 % — ABNORMAL LOW (ref 39.0–52.0)
Hemoglobin: 12.9 g/dL — ABNORMAL LOW (ref 13.0–17.0)
Lymphocytes Relative: 41.9 % (ref 12.0–46.0)
Lymphs Abs: 2.2 10*3/uL (ref 0.7–4.0)
MCHC: 34 g/dL (ref 30.0–36.0)
MCV: 94.6 fl (ref 78.0–100.0)
Monocytes Absolute: 0.7 10*3/uL (ref 0.1–1.0)
Monocytes Relative: 13.9 % — ABNORMAL HIGH (ref 3.0–12.0)
Neutro Abs: 2.2 10*3/uL (ref 1.4–7.7)
Neutrophils Relative %: 42.1 % — ABNORMAL LOW (ref 43.0–77.0)
Platelets: 84 10*3/uL — ABNORMAL LOW (ref 150.0–400.0)
RBC: 4.01 Mil/uL — ABNORMAL LOW (ref 4.22–5.81)
RDW: 14.5 % (ref 11.5–15.5)
WBC: 5.2 10*3/uL (ref 4.0–10.5)

## 2022-10-29 MED ORDER — DOXYCYCLINE HYCLATE 100 MG PO TABS: 100.0000 mg | ORAL_TABLET | Freq: Two times a day (BID) | ORAL | 0 refills | Status: AC

## 2022-10-29 NOTE — Assessment & Plan Note (Signed)
Chronic Worsening recently Has seen hematology/oncology 07/2021-no evidence of malignancy at that time and advised to follow-up as needed Platelet count has gotten lower He does report some easy bleeding if he cuts himself, but no continuous bruising He did stop the aspirin-hold for now CBC today-depending on platelet count would restart aspirin and/or refer to heme-onc

## 2022-10-29 NOTE — Assessment & Plan Note (Signed)
Acute Insect bite which is a known tick bite because he removed it 4 days ago with reaction possible infection left proximal lower leg Given possible infection will start doxycycline 100 mg twice daily x 10 days Low risk for low disease -Tick was not on him for long enough

## 2022-10-29 NOTE — Assessment & Plan Note (Signed)
Acute Pain left posterior hip and giving way Referral to sports med

## 2022-10-29 NOTE — Assessment & Plan Note (Signed)
Acute Insect bite which is a known tick bite because he removed it 4 days ago with reaction possible infection left proximal lower leg Increasing redness, blister at site Given possible infection will start doxycycline 100 mg twice daily x 10 days Low risk for low disease -Tick was not on him for long enough He will monitor the area closely and call or return if there is no improvement or any worsening

## 2022-10-29 NOTE — Patient Instructions (Addendum)
   Make an appointment with sports medicine - Posterior left hip pain - gives way sometimes.    Blood work was ordered.   The lab is on the first floor.    Medications changes include :   doxycyline twice a day for 10 days     Return if symptoms worsen or fail to improve.

## 2022-10-29 NOTE — Assessment & Plan Note (Signed)
Acute Insect bite which is a known tick bite because he removed it 4 days ago with reaction possible infection left proximal lower leg Given possible infection will start doxycycline 100 mg twice daily x 10 days Low risk for low disease -Tick was not on him for long enough 

## 2022-10-30 MED ORDER — ASPIRIN 81 MG PO TBEC: 81.0000 mg | DELAYED_RELEASE_TABLET | Freq: Every day | ORAL | 12 refills | Status: DC

## 2022-10-30 NOTE — Progress Notes (Signed)
   I, Stevenson Clinch, CMA acting as a Neurosurgeon for [name], MD.  Subjective:    CC: Left hip pain  HPI: Patient is an 89 year old male presenting with left hip pain x 6 months. Feels that the leg gives out at times. Patient locates pain to lateral aspect radiating into the leg. Had stroke 3 years ago and fell down the stairs injuring the hip. Also played football in his youth. Sx improve with movement.   Radiates: yes, into the leg towards the knee causing the knee to give Aggravates: ambulation, prolonged standing Treatments tried:   Pertinent review of Systems: No fever or chills  Relevant historical information: Hearing loss.[name]  History of gout.  History of a stroke with no residual defects. History of prostate cancer.  Objective:    Vitals:   10/31/22 0816  BP: 130/72  Pulse: 60  SpO2: 100%   General: Well Developed, well nourished, and in no acute distress.   MSK: Left hip: Normal-appearing Decreased range of motion. Tender palpation greater trochanter.  Hip abduction and external rotation strength are reduced.  Lab and Radiology Results  Hip greater trochanteric injection: Left Consent obtained and timeout performed. Area of maximum tenderness palpated and identified. Skin cleaned with alcohol, cold spray applied. A 22g needle was used to access the greater trochanteric bursa. 40mg  of Kenalog and 2 mL of Marcaine were used to inject the trochanteric bursa. Patient tolerated the procedure well.  X-ray images left hip obtained today personally and independently interpreted X-ray images left hip shows significant left hip arthritis.  No acute fractures are visible.  Surgical clips are present in the pelvis. Await formal radiology review   Impression and Recommendations:    Assessment and Plan: 89 y.o. male with left lateral hip pain thought to be primarily due to hip abductor tendinopathy/trochanteric bursitis.  He does have significant hip arthritis seen on  x-ray but his lack of anterior hip pain makes hip arthritis unlikely be the cause of today's pain.  Plan for greater trochanter injection and physical therapy referral.  Recheck in about 6 weeks.[name]  PDMP not reviewed this encounter. Orders Placed This Encounter  Procedures   DG HIP UNILAT W OR W/O PELVIS 2-3 VIEWS LEFT    Standing Status:   Future    Number of Occurrences:   1    Standing Expiration Date:   11/30/2022    Order Specific Question:   Reason for Exam (SYMPTOM  OR DIAGNOSIS REQUIRED)    Answer:   left hip pain    Order Specific Question:   Preferred imaging location?    Answer:   [facility]   Ambulatory referral to Physical Therapy    Referral Priority:   Routine    Referral Type:   Physical Medicine    Referral Reason:   Specialty Services Required    Requested Specialty:   Physical Therapy    Number of Visits Requested:   1   No orders of the defined types were placed in this encounter.   Discussed warning signs or symptoms. Please see discharge instructions. Patient expresses understanding.   The above documentation has been reviewed and is accurate and complete [name], M.D.

## 2022-10-30 NOTE — Addendum Note (Signed)
Addended by: Pincus Sanes on: 10/30/2022 05:54 AM   Modules accepted: Orders

## 2022-10-31 ENCOUNTER — Ambulatory Visit: Payer: Medicare Other | Admitting: Family Medicine

## 2022-10-31 ENCOUNTER — Encounter: Payer: Self-pay | Admitting: Family Medicine

## 2022-10-31 ENCOUNTER — Ambulatory Visit (INDEPENDENT_AMBULATORY_CARE_PROVIDER_SITE_OTHER): Payer: Medicare Other

## 2022-10-31 VITALS — BP 130/72 | HR 60 | Ht 72.0 in | Wt 198.9 lb

## 2022-10-31 DIAGNOSIS — M25552 Pain in left hip: Secondary | ICD-10-CM | POA: Diagnosis not present

## 2022-10-31 DIAGNOSIS — M1612 Unilateral primary osteoarthritis, left hip: Secondary | ICD-10-CM | POA: Diagnosis not present

## 2022-10-31 DIAGNOSIS — M7062 Trochanteric bursitis, left hip: Secondary | ICD-10-CM

## 2022-10-31 NOTE — Patient Instructions (Addendum)
Thank you for coming in today.   You received an injection today. Seek immediate medical attention if the joint becomes red, extremely painful, or is oozing fluid.   I've referred you to Physical Therapy.  Let us know if you don't hear from them in one week.   Check back in 6 weeks 

## 2022-11-02 NOTE — Therapy (Unsigned)
OUTPATIENT PHYSICAL THERAPY LOWER EXTREMITY EVALUATION   Patient Name: Chad Norris MRN: [id number] DOB:May 30, 1932, 89 y.o., male Today's Date: 11/05/2022  END OF SESSION:  PT End of Session - 11/05/22 1028     Visit Number 1    Date for PT Re-Evaluation 01/28/23    PT Start Time 0928    PT Stop Time 1010    PT Time Calculation (min) 42 min    Activity Tolerance Patient tolerated treatment well    Behavior During Therapy WFL for tasks assessed/performed             Past Medical History:  Diagnosis Date   Colon polyps    COVID-19    Diverticulosis    Gout    Hemorrhoids    Hyperlipemia    Nephrolithiasis    Prostate cancer    Stroke    Past Surgical History:  Procedure Laterality Date   CATARACT EXTRACTION W/ INTRAOCULAR LENS  IMPLANT, BILATERAL     bi-lat   CERVICAL SPINE SURGERY     COLONOSCOPY W/ POLYPECTOMY     Epidural steroid injections     3-4, Dr Danielle Dess   RETROPUBIC PROSTATECTOMY     SHOULDER SURGERY     left   Patient Active Problem List   Diagnosis Date Noted   Primary osteoarthritis of left hip 10/31/2022   Trochanteric bursitis, left hip 10/31/2022   Insect bite of left lower leg 10/29/2022   Tick bite of left lower leg 10/29/2022   Pain of left lower extremity 10/29/2022   Cellulitis of left lower leg 10/29/2022   Urine frequency 06/06/2022   Thrombocytopenia 06/06/2021   Overactive bladder 06/05/2021   Skin lesions 01/25/2021   Nephrolithiasis    COVID-19    Pneumonia 08/24/2020   Cervical radiculopathy 06/11/2019   Sensorineural hearing loss, bilateral 05/21/2019   History of CVA (cerebrovascular accident) 08/18/2018   Hyperlipidemia 03/27/2018   Basal cell carcinoma (BCC) of ala nasi 03/11/2017   Prediabetes 03/11/2017   DDD (degenerative disc disease), lumbosacral 01/13/2014   PROSTATE CANCER, HX OF 06/21/2008   COLONIC POLYPS, HX OF 06/21/2008   NEPHROLITHIASIS, HX OF 05/07/2008   History of gout 10/09/2007    PCP: Pincus Sanes, MD  REFERRING PROVIDER: Rodolph Bong, MD  REFERRING DIAG: [phone] (ICD-10-CM) - Left hip pain   THERAPY DIAG:  Difficulty in walking, not elsewhere classified  Muscle weakness (generalized)  Other lack of coordination  Stiffness of left hip, not elsewhere classified  Pain in left hip  Rationale for Evaluation and Treatment: Rehabilitation  ONSET DATE: 10/31/22  SUBJECTIVE:   SUBJECTIVE STATEMENT: Patient reports that he is having L hip pain. Dr told him it is his muscles. Started getting sore about 6 months ago.  PERTINENT HISTORY: Per referring physician report:  Relevant historical information: Hearing loss.[name]  History of gout.  History of a stroke with no residual defects. History of prostate cancer.  89 y.o. male with left lateral hip pain thought to be primarily due to hip abductor tendinopathy/trochanteric bursitis.  He does have significant hip arthritis seen on x-ray but his lack of anterior hip pain makes hip arthritis unlikely be the cause of today's pain.  Plan for greater trochanter injection and physical therapy referral.  Recheck in about 6 weeks.  Injection in hip 10/31/22  PAIN:  Are you having pain? Yes: NPRS scale: 5-6/10 Pain location: L hip Pain description: ache, fluctuates Aggravating factors: walking, climbing hills Relieving factors: rest  PRECAUTIONS: None  WEIGHT BEARING RESTRICTIONS: No  FALLS:  Has patient fallen in last 6 months? No  LIVING ENVIRONMENT: Lives with: lives with their family Lives in: House/apartment Stairs: Yes: Internal: 15 steps; on right going up and External: 3 steps; on left going up Has following equipment at home: None  OCCUPATION: golfing  PLOF: Independent  PATIENT GOALS: Decrease pain in hip to allow increased walking.  NEXT MD VISIT: about 5 weeks  OBJECTIVE:   DIAGNOSTIC FINDINGS: 10/31/22 x ray IMPRESSION: Degenerative changes in the bilateral hips, left greater than right. Partial joint  space loss on the left.  COGNITION: Overall cognitive status: Within functional limits for tasks assessed     SENSATION: WFL  MUSCLE LENGTH: Hamstrings: B hamstrings 45 Thomas test: B hip flexor tightness noted.  POSTURE: decreased lumbar lordosis and decreased thoracic kyphosis  PALPATION: TTP on L piriformis, no other TTP noted.  LOWER EXTREMITY ROM: B hips are tight in all planes, L > R, otherwise WFL.   LOWER EXTREMITY MMT: BLE strength is 4/5   LOWER EXTREMITY SPECIAL TESTS:  Hip special tests: Hip scouring test: positive  and Anterior hip impingement test: positive  SI screen largely (-), Pain in piriformis with testing, but it appears to be due to the severity of spasm.  FUNCTIONAL TESTS:  5 times sit to stand: 13.06  GAIT: Distance walked: In clinic distances Assistive device utilized: None Level of assistance: Complete Independence Comments: Patient reports that his hip pain often grabs him when walking, increases when walking uphill or on uneven terrain.   TODAY'S TREATMENT:                                                                                                                              DATE: [id number]    PATIENT EDUCATION:  Education details: POC, HEP Person educated: Patient Education method: Health visitor Education comprehension: verbalized understanding and returned demonstration  HOME EXERCISE PROGRAM: [id number]  ASSESSMENT:  CLINICAL IMPRESSION: Patient is a 89 y.o. who was seen today for physical therapy evaluation and treatment for L hip pain. He is very TTP over his L piriformis. He is very tight in his hip muscles, with some mild weakness. His piriformis is severely inflamed and painful. He will benefit from PT to facilitate STM mob to piriformis along with stretching and strengthening to B hips and LE to minimize his pain and decrease risk of the pain returning.  OBJECTIVE IMPAIRMENTS: Abnormal gait,  decreased activity tolerance, decreased balance, decreased endurance, difficulty walking, decreased ROM, decreased strength, increased muscle spasms, impaired flexibility, and pain.   ACTIVITY LIMITATIONS: bending, sitting, squatting, stairs, and locomotion level  PARTICIPATION LIMITATIONS: cleaning, shopping, and community activity  PERSONAL FACTORS: Age and Past/current experiences are also affecting patient's functional outcome.   REHAB POTENTIAL: Good  CLINICAL DECISION MAKING: Evolving/moderate complexity  EVALUATION COMPLEXITY: Moderate   GOALS: Goals reviewed with patient? Yes  SHORT TERM GOALS: Target date: 11/19/22 I with  initial HEP Baseline: Goal status: INITIAL  LONG TERM GOALS: Target date: 01/28/23  I with final HEP Baseline:  Goal status: INITIAL  2.  Decrease 5 x STS to > 12 sec, with no pain in L hip Baseline: 13.06 Goal status: INITIAL  3.  Patient will be able to walk x at least 500' on level and unlevel surfaces I, with L hip pain < 3/10 Baseline: 6/10, limits walking. Goal status: INITIAL  4.  Increase pain free L hip ROM to [facility] Baseline: Limited ROM, painful. Goal status: INITIAL  5.  Increase B hip strength to5/5 Baseline:  Goal status: INITIAL  PLAN:  PT FREQUENCY: 1-2x/week  PT DURATION: 12 weeks  PLANNED INTERVENTIONS: Therapeutic exercises, Therapeutic activity, Neuromuscular re-education, Balance training, Gait training, Patient/Family education, Self Care, Joint mobilization, Stair training, Dry Needling, Electrical stimulation, Cryotherapy, Moist heat, Ionotophoresis 4mg /ml Dexamethasone, and Manual therapy  PLAN FOR NEXT SESSION: Update HEP, Deep tissue work on piriformis   Iona Beard, DPT 11/05/2022, 10:35 AM

## 2022-11-02 NOTE — Progress Notes (Signed)
Left hip x-ray shows some arthritis changes left is worse than right.

## 2022-11-05 ENCOUNTER — Ambulatory Visit: Payer: Medicare Other | Attending: Family Medicine | Admitting: Physical Therapy

## 2022-11-05 ENCOUNTER — Encounter: Payer: Self-pay | Admitting: Physical Therapy

## 2022-11-05 DIAGNOSIS — M25552 Pain in left hip: Secondary | ICD-10-CM | POA: Diagnosis not present

## 2022-11-05 DIAGNOSIS — R262 Difficulty in walking, not elsewhere classified: Secondary | ICD-10-CM | POA: Diagnosis not present

## 2022-11-05 DIAGNOSIS — R278 Other lack of coordination: Secondary | ICD-10-CM | POA: Diagnosis not present

## 2022-11-05 DIAGNOSIS — M25652 Stiffness of left hip, not elsewhere classified: Secondary | ICD-10-CM | POA: Insufficient documentation

## 2022-11-05 DIAGNOSIS — M6281 Muscle weakness (generalized): Secondary | ICD-10-CM | POA: Diagnosis not present

## 2022-11-09 ENCOUNTER — Ambulatory Visit: Payer: Medicare Other | Admitting: Physical Therapy

## 2022-11-09 ENCOUNTER — Encounter: Payer: Self-pay | Admitting: Physical Therapy

## 2022-11-09 DIAGNOSIS — M25552 Pain in left hip: Secondary | ICD-10-CM | POA: Diagnosis not present

## 2022-11-09 DIAGNOSIS — M6281 Muscle weakness (generalized): Secondary | ICD-10-CM | POA: Diagnosis not present

## 2022-11-09 DIAGNOSIS — M25652 Stiffness of left hip, not elsewhere classified: Secondary | ICD-10-CM | POA: Diagnosis not present

## 2022-11-09 DIAGNOSIS — R262 Difficulty in walking, not elsewhere classified: Secondary | ICD-10-CM

## 2022-11-09 DIAGNOSIS — R278 Other lack of coordination: Secondary | ICD-10-CM | POA: Diagnosis not present

## 2022-11-09 NOTE — Therapy (Signed)
OUTPATIENT PHYSICAL THERAPY LOWER EXTREMITY TREATMENT   Patient Name: Chad Norris MRN: [id number] DOB:03/15/1932, 89 y.o., male Today's Date: 11/09/2022  END OF SESSION:  PT End of Session - 11/09/22 0922     Visit Number 2    Date for PT Re-Evaluation 01/28/23    PT Start Time 0925    PT Stop Time 1010    PT Time Calculation (min) 45 min    Activity Tolerance Patient tolerated treatment well    Behavior During Therapy WFL for tasks assessed/performed             Past Medical History:  Diagnosis Date   Colon polyps    COVID-19    Diverticulosis    Gout    Hemorrhoids    Hyperlipemia    Nephrolithiasis    Prostate cancer    Stroke    Past Surgical History:  Procedure Laterality Date   CATARACT EXTRACTION W/ INTRAOCULAR LENS  IMPLANT, BILATERAL     bi-lat   CERVICAL SPINE SURGERY     COLONOSCOPY W/ POLYPECTOMY     Epidural steroid injections     3-4, Dr Danielle Dess   RETROPUBIC PROSTATECTOMY     SHOULDER SURGERY     left   Patient Active Problem List   Diagnosis Date Noted   Primary osteoarthritis of left hip 10/31/2022   Trochanteric bursitis, left hip 10/31/2022   Insect bite of left lower leg 10/29/2022   Tick bite of left lower leg 10/29/2022   Pain of left lower extremity 10/29/2022   Cellulitis of left lower leg 10/29/2022   Urine frequency 06/06/2022   Thrombocytopenia 06/06/2021   Overactive bladder 06/05/2021   Skin lesions 01/25/2021   Nephrolithiasis    COVID-19    Pneumonia 08/24/2020   Cervical radiculopathy 06/11/2019   Sensorineural hearing loss, bilateral 05/21/2019   History of CVA (cerebrovascular accident) 08/18/2018   Hyperlipidemia 03/27/2018   Basal cell carcinoma (BCC) of ala nasi 03/11/2017   Prediabetes 03/11/2017   DDD (degenerative disc disease), lumbosacral 01/13/2014   PROSTATE CANCER, HX OF 06/21/2008   COLONIC POLYPS, HX OF 06/21/2008   NEPHROLITHIASIS, HX OF 05/07/2008   History of gout 10/09/2007    PCP: Pincus Sanes, MD  REFERRING PROVIDER: Rodolph Bong, MD  REFERRING DIAG: [phone] (ICD-10-CM) - Left hip pain   THERAPY DIAG:  Difficulty in walking, not elsewhere classified  Muscle weakness (generalized)  Pain in left hip  Rationale for Evaluation and Treatment: Rehabilitation  ONSET DATE: 10/31/22  SUBJECTIVE:   SUBJECTIVE STATEMENT: Hip pain been going on for 6 months or more.  PERTINENT HISTORY: Per referring physician report:  Relevant historical information: Hearing loss.[name]  History of gout.  History of a stroke with no residual defects. History of prostate cancer.  89 y.o. male with left lateral hip pain thought to be primarily due to hip abductor tendinopathy/trochanteric bursitis.  He does have significant hip arthritis seen on x-ray but his lack of anterior hip pain makes hip arthritis unlikely be the cause of today's pain.  Plan for greater trochanter injection and physical therapy referral.  Recheck in about 6 weeks.  Injection in hip 10/31/22  PAIN:  Are you having pain? Yes: NPRS scale: 3/10 Pain location: L hip Pain description: ache, fluctuates Aggravating factors: walking, climbing hills Relieving factors: rest  PRECAUTIONS: None  WEIGHT BEARING RESTRICTIONS: No  FALLS:  Has patient fallen in last 6 months? No  LIVING ENVIRONMENT: Lives with: lives with their family Lives in:  House/apartment Stairs: Yes: Internal: 15 steps; on right going up and External: 3 steps; on left going up Has following equipment at home: None  OCCUPATION: golfing  PLOF: Independent  PATIENT GOALS: Decrease pain in hip to allow increased walking.  NEXT MD VISIT: about 5 weeks  OBJECTIVE:   DIAGNOSTIC FINDINGS: 10/31/22 x ray IMPRESSION: Degenerative changes in the bilateral hips, left greater than right. Partial joint space loss on the left.  COGNITION: Overall cognitive status: Within functional limits for tasks assessed     SENSATION: WFL  MUSCLE  LENGTH: Hamstrings: B hamstrings 45 Thomas test: B hip flexor tightness noted.  POSTURE: decreased lumbar lordosis and decreased thoracic kyphosis  PALPATION: TTP on L piriformis, no other TTP noted.  LOWER EXTREMITY ROM: B hips are tight in all planes, L > R, otherwise WFL.   LOWER EXTREMITY MMT: BLE strength is 4/5   LOWER EXTREMITY SPECIAL TESTS:  Hip special tests: Hip scouring test: positive  and Anterior hip impingement test: positive  SI screen largely (-), Pain in piriformis with testing, but it appears to be due to the severity of spasm.  FUNCTIONAL TESTS:  5 times sit to stand: 13.06  GAIT: Distance walked: In clinic distances Assistive device utilized: None Level of assistance: Complete Independence Comments: Patient reports that his hip pain often grabs him when walking, increases when walking uphill or on uneven terrain.   TODAY'S TREATMENT:                                                                                                                              DATE:  11/09/22 Bike L1 x 2 min increase L hip pain NuStep L 5 x 5 min MT PROM to R hip in all dorections with end range holds  STM to Lateral L hip Bridges  LE on Pball bridges, Oblk, K2C  Hip Add ball squeeze Hip abd w/ manual resistance   Hamstring curls 20lb 2x10 Leg Ext 5lb 2x10 Lateral 4 in step ups    PATIENT EDUCATION:  Education details: POC, HEP Person educated: Patient Education method: Health visitor Education comprehension: verbalized understanding and returned demonstration  HOME EXERCISE PROGRAM: [id number]  ASSESSMENT:  CLINICAL IMPRESSION: Patient is a 89 y.o. who was seen today for physical therapy evaluation and treatment for L hip pain. He is very TTP over his L piriformis. Some pain with bike but no issues with NuStep. Pt very guarded with MT with the anticipation of pain. R hip is very tight with most movements. Positive response to STM. Little  hip elevation with Pball bridges. Cue for full ROM needed with hamstring curls and leg extensions. He will benefit from PT to facilitate STM mob to piriformis along with stretching and strengthening to B hips and LE to minimize his pain and decrease risk of the pain returning.  OBJECTIVE IMPAIRMENTS: Abnormal gait, decreased activity tolerance, decreased balance, decreased endurance, difficulty walking, decreased ROM, decreased strength, increased  muscle spasms, impaired flexibility, and pain.   ACTIVITY LIMITATIONS: bending, sitting, squatting, stairs, and locomotion level  PARTICIPATION LIMITATIONS: cleaning, shopping, and community activity  PERSONAL FACTORS: Age and Past/current experiences are also affecting patient's functional outcome.   REHAB POTENTIAL: Good  CLINICAL DECISION MAKING: Evolving/moderate complexity  EVALUATION COMPLEXITY: Moderate   GOALS: Goals reviewed with patient? Yes  SHORT TERM GOALS: Target date: 11/19/22 I with initial HEP Baseline: Goal status: INITIAL  LONG TERM GOALS: Target date: 01/28/23  I with final HEP Baseline:  Goal status: INITIAL  2.  Decrease 5 x STS to > 12 sec, with no pain in L hip Baseline: 13.06 Goal status: INITIAL  3.  Patient will be able to walk x at least 500' on level and unlevel surfaces I, with L hip pain < 3/10 Baseline: 6/10, limits walking. Goal status: INITIAL  4.  Increase pain free L hip ROM to [facility] Baseline: Limited ROM, painful. Goal status: INITIAL  5.  Increase B hip strength to5/5 Baseline:  Goal status: INITIAL  PLAN:  PT FREQUENCY: 1-2x/week  PT DURATION: 12 weeks  PLANNED INTERVENTIONS: Therapeutic exercises, Therapeutic activity, Neuromuscular re-education, Balance training, Gait training, Patient/Family education, Self Care, Joint mobilization, Stair training, Dry Needling, Electrical stimulation, Cryotherapy, Moist heat, Ionotophoresis 4mg /ml Dexamethasone, and Manual therapy  PLAN FOR NEXT  SESSION: Update HEP, Deep tissue work on piriformis   Iona Beard, DPT 11/09/2022, 9:22 AM

## 2022-11-12 ENCOUNTER — Ambulatory Visit: Payer: Medicare Other | Admitting: Physical Therapy

## 2022-11-12 ENCOUNTER — Encounter: Payer: Self-pay | Admitting: Physical Therapy

## 2022-11-12 DIAGNOSIS — M25552 Pain in left hip: Secondary | ICD-10-CM

## 2022-11-12 DIAGNOSIS — R262 Difficulty in walking, not elsewhere classified: Secondary | ICD-10-CM

## 2022-11-12 DIAGNOSIS — M6281 Muscle weakness (generalized): Secondary | ICD-10-CM

## 2022-11-12 DIAGNOSIS — R278 Other lack of coordination: Secondary | ICD-10-CM | POA: Diagnosis not present

## 2022-11-12 DIAGNOSIS — M25652 Stiffness of left hip, not elsewhere classified: Secondary | ICD-10-CM | POA: Diagnosis not present

## 2022-11-12 NOTE — Therapy (Signed)
OUTPATIENT PHYSICAL THERAPY LOWER EXTREMITY TREATMENT   Patient Name: Chad Norris MRN: [id number] DOB:12-19-1931, 89 y.o., male Today's Date: 11/12/2022  END OF SESSION:  PT End of Session - 11/12/22 0830     Visit Number 3    Date for PT Re-Evaluation 01/28/23    PT Start Time 0830    PT Stop Time 0915    PT Time Calculation (min) 45 min    Activity Tolerance Patient tolerated treatment well    Behavior During Therapy WFL for tasks assessed/performed             Past Medical History:  Diagnosis Date   Colon polyps    COVID-19    Diverticulosis    Gout    Hemorrhoids    Hyperlipemia    Nephrolithiasis    Prostate cancer    Stroke    Past Surgical History:  Procedure Laterality Date   CATARACT EXTRACTION W/ INTRAOCULAR LENS  IMPLANT, BILATERAL     bi-lat   CERVICAL SPINE SURGERY     COLONOSCOPY W/ POLYPECTOMY     Epidural steroid injections     3-4, Dr Danielle Dess   RETROPUBIC PROSTATECTOMY     SHOULDER SURGERY     left   Patient Active Problem List   Diagnosis Date Noted   Primary osteoarthritis of left hip 10/31/2022   Trochanteric bursitis, left hip 10/31/2022   Insect bite of left lower leg 10/29/2022   Tick bite of left lower leg 10/29/2022   Pain of left lower extremity 10/29/2022   Cellulitis of left lower leg 10/29/2022   Urine frequency 06/06/2022   Thrombocytopenia 06/06/2021   Overactive bladder 06/05/2021   Skin lesions 01/25/2021   Nephrolithiasis    COVID-19    Pneumonia 08/24/2020   Cervical radiculopathy 06/11/2019   Sensorineural hearing loss, bilateral 05/21/2019   History of CVA (cerebrovascular accident) 08/18/2018   Hyperlipidemia 03/27/2018   Basal cell carcinoma (BCC) of ala nasi 03/11/2017   Prediabetes 03/11/2017   DDD (degenerative disc disease), lumbosacral 01/13/2014   PROSTATE CANCER, HX OF 06/21/2008   COLONIC POLYPS, HX OF 06/21/2008   NEPHROLITHIASIS, HX OF 05/07/2008   History of gout 10/09/2007    PCP: Pincus Sanes, MD  REFERRING PROVIDER: Rodolph Bong, MD  REFERRING DIAG: [phone] (ICD-10-CM) - Left hip pain   THERAPY DIAG:  Difficulty in walking, not elsewhere classified  Muscle weakness (generalized)  Pain in left hip  Rationale for Evaluation and Treatment: Rehabilitation  ONSET DATE: 10/31/22  SUBJECTIVE:   SUBJECTIVE STATEMENT: Doing ok, felt fine after last session but two hours later hard time walking  PERTINENT HISTORY: Per referring physician report:  Relevant historical information: Hearing loss.[name]  History of gout.  History of a stroke with no residual defects. History of prostate cancer.  89 y.o. male with left lateral hip pain thought to be primarily due to hip abductor tendinopathy/trochanteric bursitis.  He does have significant hip arthritis seen on x-ray but his lack of anterior hip pain makes hip arthritis unlikely be the cause of today's pain.  Plan for greater trochanter injection and physical therapy referral.  Recheck in about 6 weeks.  Injection in hip 10/31/22  PAIN:  Are you having pain? Yes: NPRS scale: 0/10 Pain location: L hip Pain description: ache, fluctuates Aggravating factors: walking, climbing hills Relieving factors: rest  PRECAUTIONS: None  WEIGHT BEARING RESTRICTIONS: No  FALLS:  Has patient fallen in last 6 months? No  LIVING ENVIRONMENT: Lives with: lives with  their family Lives in: House/apartment Stairs: Yes: Internal: 15 steps; on right going up and External: 3 steps; on left going up Has following equipment at home: None  OCCUPATION: golfing  PLOF: Independent  PATIENT GOALS: Decrease pain in hip to allow increased walking.  NEXT MD VISIT: about 5 weeks  OBJECTIVE:   DIAGNOSTIC FINDINGS: 10/31/22 x ray IMPRESSION: Degenerative changes in the bilateral hips, left greater than right. Partial joint space loss on the left.  COGNITION: Overall cognitive status: Within functional limits for tasks  assessed     SENSATION: WFL  MUSCLE LENGTH: Hamstrings: B hamstrings 45 Thomas test: B hip flexor tightness noted.  POSTURE: decreased lumbar lordosis and decreased thoracic kyphosis  PALPATION: TTP on L piriformis, no other TTP noted.  LOWER EXTREMITY ROM: B hips are tight in all planes, L > R, otherwise WFL.   LOWER EXTREMITY MMT: BLE strength is 4/5   LOWER EXTREMITY SPECIAL TESTS:  Hip special tests: Hip scouring test: positive  and Anterior hip impingement test: positive  SI screen largely (-), Pain in piriformis with testing, but it appears to be due to the severity of spasm.  FUNCTIONAL TESTS:  5 times sit to stand: 13.06  GAIT: Distance walked: In clinic distances Assistive device utilized: None Level of assistance: Complete Independence Comments: Patient reports that his hip pain often grabs him when walking, increases when walking uphill or on uneven terrain.   TODAY'S TREATMENT:                                                                                                                              DATE:  11/10/20 NuStep L5 x 6 min Resisted gait 20lb all directions x3 each Hamstring curl 25lb 2x10 Leg Ext 10b 2x10 Hip add ball squeeze 2x10 Hip abd w/ manual resistance MHP applied to lumbar spine MT PROM to R hip in all dorections with end range holds STM to Lateral L hip   11/09/22 Bike L1 x 2 min increase L hip pain NuStep L 5 x 5 min MT PROM to R hip in all dorections with end range holds  STM to Lateral L hip Bridges  LE on Pball bridges, Oblk, K2C  Hip Add ball squeeze Hip abd w/ manual resistance   Hamstring curls 20lb 2x10 Leg Ext 5lb 2x10 Lateral 4 in step ups    PATIENT EDUCATION:  Education details: POC, HEP Person educated: Patient Education method: Health visitor Education comprehension: verbalized understanding and returned demonstration  HOME EXERCISE PROGRAM: [id number]  ASSESSMENT:  CLINICAL  IMPRESSION: Patient is a 89 y.o. who was seen today for physical therapy evaluation and treatment for L hip pain. He is very TTP over his L piriformis. Pt remains very guarded with MT with the anticipation of pain. R hip is very tight with most movements. Positive response to STM. Cue needed to control the eccentric phase of resisted gait. Cue for full ROM needed with hamstring  curls and leg extensions. He will benefit from PT to facilitate STM mob to piriformis along with stretching and strengthening to B hips and LE to minimize his pain and decrease risk of the pain returning.  OBJECTIVE IMPAIRMENTS: Abnormal gait, decreased activity tolerance, decreased balance, decreased endurance, difficulty walking, decreased ROM, decreased strength, increased muscle spasms, impaired flexibility, and pain.   ACTIVITY LIMITATIONS: bending, sitting, squatting, stairs, and locomotion level  PARTICIPATION LIMITATIONS: cleaning, shopping, and community activity  PERSONAL FACTORS: Age and Past/current experiences are also affecting patient's functional outcome.   REHAB POTENTIAL: Good  CLINICAL DECISION MAKING: Evolving/moderate complexity  EVALUATION COMPLEXITY: Moderate   GOALS: Goals reviewed with patient? Yes  SHORT TERM GOALS: Target date: 11/19/22 I with initial HEP Baseline: Goal status: INITIAL  LONG TERM GOALS: Target date: 01/28/23  I with final HEP Baseline:  Goal status: INITIAL  2.  Decrease 5 x STS to > 12 sec, with no pain in L hip Baseline: 13.06 Goal status: INITIAL  3.  Patient will be able to walk x at least 500' on level and unlevel surfaces I, with L hip pain < 3/10 Baseline: 6/10, limits walking. Goal status: INITIAL  4.  Increase pain free L hip ROM to [facility] Baseline: Limited ROM, painful. Goal status: INITIAL  5.  Increase B hip strength to5/5 Baseline:  Goal status: INITIAL  PLAN:  PT FREQUENCY: 1-2x/week  PT DURATION: 12 weeks  PLANNED INTERVENTIONS:  Therapeutic exercises, Therapeutic activity, Neuromuscular re-education, Balance training, Gait training, Patient/Family education, Self Care, Joint mobilization, Stair training, Dry Needling, Electrical stimulation, Cryotherapy, Moist heat, Ionotophoresis /ml Dexamethasone, and Manual therapy  PLAN FOR NEXT SESSION: Update HEP, Deep tissue work on piriformis   Iona Beard, DPT 11/12/2022, 8:30 AM

## 2022-11-14 ENCOUNTER — Encounter: Payer: Self-pay | Admitting: Physical Therapy

## 2022-11-14 ENCOUNTER — Ambulatory Visit: Payer: Medicare Other | Admitting: Physical Therapy

## 2022-11-14 DIAGNOSIS — R278 Other lack of coordination: Secondary | ICD-10-CM | POA: Diagnosis not present

## 2022-11-14 DIAGNOSIS — R262 Difficulty in walking, not elsewhere classified: Secondary | ICD-10-CM | POA: Diagnosis not present

## 2022-11-14 DIAGNOSIS — M25652 Stiffness of left hip, not elsewhere classified: Secondary | ICD-10-CM | POA: Diagnosis not present

## 2022-11-14 DIAGNOSIS — M6281 Muscle weakness (generalized): Secondary | ICD-10-CM | POA: Diagnosis not present

## 2022-11-14 DIAGNOSIS — M25552 Pain in left hip: Secondary | ICD-10-CM

## 2022-11-14 NOTE — Therapy (Signed)
OUTPATIENT PHYSICAL THERAPY LOWER EXTREMITY TREATMENT   Patient Name: Chad Norris MRN: [id number] DOB:11-08-1931, 89 y.o., male Today's Date: 11/14/2022  END OF SESSION:  PT End of Session - 11/14/22 1146     Visit Number 4    Date for PT Re-Evaluation 01/28/23    PT Start Time 1142    PT Stop Time 1225    PT Time Calculation (min) 43 min    Activity Tolerance Patient tolerated treatment well    Behavior During Therapy WFL for tasks assessed/performed             Past Medical History:  Diagnosis Date   Colon polyps    COVID-19    Diverticulosis    Gout    Hemorrhoids    Hyperlipemia    Nephrolithiasis    Prostate cancer    Stroke    Past Surgical History:  Procedure Laterality Date   CATARACT EXTRACTION W/ INTRAOCULAR LENS  IMPLANT, BILATERAL     bi-lat   CERVICAL SPINE SURGERY     COLONOSCOPY W/ POLYPECTOMY     Epidural steroid injections     3-4, Dr Danielle Dess   RETROPUBIC PROSTATECTOMY     SHOULDER SURGERY     left   Patient Active Problem List   Diagnosis Date Noted   Primary osteoarthritis of left hip 10/31/2022   Trochanteric bursitis, left hip 10/31/2022   Insect bite of left lower leg 10/29/2022   Tick bite of left lower leg 10/29/2022   Pain of left lower extremity 10/29/2022   Cellulitis of left lower leg 10/29/2022   Urine frequency 06/06/2022   Thrombocytopenia 06/06/2021   Overactive bladder 06/05/2021   Skin lesions 01/25/2021   Nephrolithiasis    COVID-19    Pneumonia 08/24/2020   Cervical radiculopathy 06/11/2019   Sensorineural hearing loss, bilateral 05/21/2019   History of CVA (cerebrovascular accident) 08/18/2018   Hyperlipidemia 03/27/2018   Basal cell carcinoma (BCC) of ala nasi 03/11/2017   Prediabetes 03/11/2017   DDD (degenerative disc disease), lumbosacral 01/13/2014   PROSTATE CANCER, HX OF 06/21/2008   COLONIC POLYPS, HX OF 06/21/2008   NEPHROLITHIASIS, HX OF 05/07/2008   History of gout 10/09/2007    PCP: Pincus Sanes, MD  REFERRING PROVIDER: Rodolph Bong, MD  REFERRING DIAG: [phone] (ICD-10-CM) - Left hip pain   THERAPY DIAG:  Difficulty in walking, not elsewhere classified  Muscle weakness (generalized)  Other lack of coordination  Pain in left hip  Stiffness of left hip, not elsewhere classified  Rationale for Evaluation and Treatment: Rehabilitation  ONSET DATE: 10/31/22  SUBJECTIVE:   SUBJECTIVE STATEMENT: Patient reports that after treatment he usually takes a rest and then feels better moving.  PERTINENT HISTORY: Per referring physician report:  Relevant historical information: Hearing loss.[name]  History of gout.  History of a stroke with no residual defects. History of prostate cancer.  89 y.o. male with left lateral hip pain thought to be primarily due to hip abductor tendinopathy/trochanteric bursitis.  He does have significant hip arthritis seen on x-ray but his lack of anterior hip pain makes hip arthritis unlikely be the cause of today's pain.  Plan for greater trochanter injection and physical therapy referral.  Recheck in about 6 weeks.  Injection in hip 10/31/22  PAIN:  Are you having pain? Yes: NPRS scale: 0/10 Pain location: L hip Pain description: ache, fluctuates Aggravating factors: walking, climbing hills Relieving factors: rest  PRECAUTIONS: None  WEIGHT BEARING RESTRICTIONS: No  FALLS:  Has  patient fallen in last 6 months? No  LIVING ENVIRONMENT: Lives with: lives with their family Lives in: House/apartment Stairs: Yes: Internal: 15 steps; on right going up and External: 3 steps; on left going up Has following equipment at home: None  OCCUPATION: golfing  PLOF: Independent  PATIENT GOALS: Decrease pain in hip to allow increased walking.  NEXT MD VISIT: about 5 weeks  OBJECTIVE:   DIAGNOSTIC FINDINGS: 10/31/22 x ray IMPRESSION: Degenerative changes in the bilateral hips, left greater than right. Partial joint space loss on the  left.  COGNITION: Overall cognitive status: Within functional limits for tasks assessed     SENSATION: WFL  MUSCLE LENGTH: Hamstrings: B hamstrings 45 Thomas test: B hip flexor tightness noted.  POSTURE: decreased lumbar lordosis and decreased thoracic kyphosis  PALPATION: TTP on L piriformis, no other TTP noted.  LOWER EXTREMITY ROM: B hips are tight in all planes, L > R, otherwise WFL.   LOWER EXTREMITY MMT: BLE strength is 4/5   LOWER EXTREMITY SPECIAL TESTS:  Hip special tests: Hip scouring test: positive  and Anterior hip impingement test: positive  SI screen largely (-), Pain in piriformis with testing, but it appears to be due to the severity of spasm.  FUNCTIONAL TESTS:  5 times sit to stand: 13.06  GAIT: Distance walked: In clinic distances Assistive device utilized: None Level of assistance: Complete Independence Comments: Patient reports that his hip pain often grabs him when walking, increases when walking uphill or on uneven terrain.   TODAY'S TREATMENT:                                                                                                                              DATE:  11/14/22 NuStep L5 x 6 minutes Deep STM to L piriformis with tennis ball and massage gun. Educated patient to using massage gun and tennis ball in sup or sit Stretch to piriformis and gluts in supine. 3 x 15 sec Supine strengthening- clamshells with G tband, bridge, 2x 10 each Sit to stand on air ex x 10, then 10 mini squats on airex, tended to lean back a bit.   11/10/20 NuStep L5 x 6 min Resisted gait 20lb all directions x3 each Hamstring curl 25lb 2x10 Leg Ext 10b 2x10 Hip add ball squeeze 2x10 Hip abd w/ manual resistance MHP applied to lumbar spine MT PROM to R hip in all dorections with end range holds STM to Lateral L hip   11/09/22 Bike L1 x 2 min increase L hip pain NuStep L 5 x 5 min MT PROM to R hip in all dorections with end range holds  STM to Lateral  L hip Bridges  LE on Pball bridges, Oblk, K2C  Hip Add ball squeeze Hip abd w/ manual resistance   Hamstring curls 20lb 2x10 Leg Ext 5lb 2x10 Lateral 4 in step ups    PATIENT EDUCATION:  Education details: POC, HEP Person educated: Patient Education method: Explanation  and Demonstration handout Education comprehension: verbalized understanding and returned demonstration  HOME EXERCISE PROGRAM: [id number]  ASSESSMENT:  CLINICAL IMPRESSION: Provided deep STM to L piriformis followed by stretching and strengthening to hip muscles. Offered ice, but patient declined. Updated HEP to include some strengthening for hips.  OBJECTIVE IMPAIRMENTS: Abnormal gait, decreased activity tolerance, decreased balance, decreased endurance, difficulty walking, decreased ROM, decreased strength, increased muscle spasms, impaired flexibility, and pain.   ACTIVITY LIMITATIONS: bending, sitting, squatting, stairs, and locomotion level  PARTICIPATION LIMITATIONS: cleaning, shopping, and community activity  PERSONAL FACTORS: Age and Past/current experiences are also affecting patient's functional outcome.   REHAB POTENTIAL: Good  CLINICAL DECISION MAKING: Evolving/moderate complexity  EVALUATION COMPLEXITY: Moderate   GOALS: Goals reviewed with patient? Yes  SHORT TERM GOALS: Target date: 11/19/22 I with initial HEP Baseline: Goal status: 11/14/22, met  LONG TERM GOALS: Target date: 01/28/23  I with final HEP Baseline:  Goal status: INITIAL  2.  Decrease 5 x STS to > 12 sec, with no pain in L hip Baseline: 13.06 Goal status: INITIAL  3.  Patient will be able to walk x at least 500' on level and unlevel surfaces I, with L hip pain < 3/10 Baseline: 6/10, limits walking. Goal status: INITIAL  4.  Increase pain free L hip ROM to [facility] Baseline: Limited ROM, painful. Goal status: 11/14/22-patient reports improvement, but still painful at times.  5.  Increase B hip strength to5/5 Baseline:   Goal status: 11/14/22-Added strengthening exercises to HEP, ongoing.  PLAN:  PT FREQUENCY: 1-2x/week  PT DURATION: 12 weeks  PLANNED INTERVENTIONS: Therapeutic exercises, Therapeutic activity, Neuromuscular re-education, Balance training, Gait training, Patient/Family education, Self Care, Joint mobilization, Stair training, Dry Needling, Electrical stimulation, Cryotherapy, Moist heat, Ionotophoresis 4mg /ml Dexamethasone, and Manual therapy  PLAN FOR NEXT SESSION: Update HEP, Deep tissue work on piriformis   Iona Beard, DPT 11/14/2022, 12:30 PM

## 2022-11-15 ENCOUNTER — Encounter: Payer: Self-pay | Admitting: Internal Medicine

## 2022-11-17 ENCOUNTER — Ambulatory Visit
Admission: EM | Admit: 2022-11-17 | Discharge: 2022-11-17 | Disposition: A | Discharge status: Home / Self Care | Payer: Medicare Other | Attending: Urgent Care | Admitting: Urgent Care

## 2022-11-17 DIAGNOSIS — L249 Irritant contact dermatitis, unspecified cause: Secondary | ICD-10-CM | POA: Diagnosis not present

## 2022-11-17 DIAGNOSIS — W57XXXA Bitten or stung by nonvenomous insect and other nonvenomous arthropods, initial encounter: Secondary | ICD-10-CM

## 2022-11-17 MED ORDER — TRIAMCINOLONE ACETONIDE 0.1 % EX CREA: 1.0000 | TOPICAL_CREAM | Freq: Two times a day (BID) | CUTANEOUS | 0 refills | Status: DC

## 2022-11-17 NOTE — ED Triage Notes (Signed)
Pt reports he a redness where he had a tick bite in the left lower leg 1 month ago. Pty reports rash in the upper left leg x 4 days.    Pt was finished doxycyline and was feeling better.

## 2022-11-17 NOTE — ED Provider Notes (Signed)
[facility]  Note:  This document was prepared using Conservation officer, historic buildings and may include unintentional dictation errors.  MRN: [id number] DOB: [birth year]  Subjective:   Chad Norris is a 89 y.o. male presenting for 1 month history of persistent left lower anterior leg redness following a tick bite. No drainage of pus or bleeding. Redness has not progressed. No fever, headaches, cough, eye symptoms, nausea and vomiting, belly pain. Had a rash develop over the medial left thigh. Saw his PCP 2 weeks ago and underwent a doxycycline course for 10 days. Did not make a difference per patient.   No current facility-administered medications for this encounter.  Current Outpatient Medications:    acetaminophen (TYLENOL) 325 MG tablet, Take 2 tablets (650 mg total) by mouth every 6 (six) hours as needed for moderate pain., Disp: 30 tablet, Rfl: 0   aspirin EC 81 MG tablet, Take 1 tablet (81 mg total) by mouth daily. Swallow whole., Disp: 30 tablet, Rfl: 12   atorvastatin (LIPITOR) 40 MG tablet, Take 1 tablet (40 mg total) by mouth daily., Disp: 90 tablet, Rfl: 0   Multiple Vitamins-Minerals (ADULT ONE DAILY GUMMIES) CHEW, Chew 2 tablets by mouth daily. men, Disp: , Rfl:    No Known Allergies  Past Medical History:  Diagnosis Date   Colon polyps    COVID-19    Diverticulosis    Gout    Hemorrhoids    Hyperlipemia    Nephrolithiasis    Prostate cancer (HCC)    Stroke [facility])      Past Surgical History:  Procedure Laterality Date   CATARACT EXTRACTION W/ INTRAOCULAR LENS  IMPLANT, BILATERAL     bi-lat   CERVICAL SPINE SURGERY     COLONOSCOPY W/ POLYPECTOMY     Epidural steroid injections     3-4, Dr Danielle Dess   RETROPUBIC PROSTATECTOMY     SHOULDER SURGERY     left    Family History  Problem Relation Age of Onset   Kidney cancer Father    Colon cancer Father    Cancer Brother        ?   Heart attack Brother 55   Heart attack Sister 71    Breast cancer Sister    Diabetes Brother    Alcohol abuse Brother    Colon polyps Brother     Social History   Tobacco Use   Smoking status: Never   Smokeless tobacco: Never  Vaping Use   Vaping Use: Never used  Substance Use Topics   Alcohol use: Not Currently    Alcohol/week: 1.0 standard drink of alcohol    Types: 1 Cans of beer per week   Drug use: No    ROS   Objective:   Vitals: BP (!) 147/83 (BP Location: Right Arm)   Pulse (!) 53   Temp 98.7 F (37.1 C) (Oral)   Resp 18   SpO2 97%   Physical Exam Constitutional:      General: He is not in acute distress.    Appearance: Normal appearance. He is well-developed and normal weight. He is not ill-appearing, toxic-appearing or diaphoretic.  HENT:     Head: Normocephalic and atraumatic.     Right Ear: External ear normal.     Left Ear: External ear normal.     Nose: Nose normal.     Mouth/Throat:     Pharynx: Oropharynx is clear. No pharyngeal swelling, oropharyngeal exudate, posterior oropharyngeal erythema or uvula swelling.  Tonsils: No tonsillar exudate or tonsillar abscesses. 0 on the right. 0 on the left.  Eyes:     General: Lids are everted, no foreign bodies appreciated. No scleral icterus.       Right eye: No foreign body, discharge or hordeolum.        Left eye: No foreign body, discharge or hordeolum.     Extraocular Movements: Extraocular movements intact.     Conjunctiva/sclera:     Right eye: Right conjunctiva is not injected. No chemosis, exudate or hemorrhage.    Left eye: Left conjunctiva is not injected. No chemosis, exudate or hemorrhage. Cardiovascular:     Rate and Rhythm: Normal rate.  Pulmonary:     Effort: Pulmonary effort is normal.  Musculoskeletal:     Cervical back: Normal range of motion.  Lymphadenopathy:     Cervical: No cervical adenopathy.  Skin:    General: Skin is warm and dry.     Findings: Rash present.     Comments: 2 distinct erythematic/slight violaceous rashes  to the left leg.  The lowermost is from the tick bite.  Over the medial thigh patient reports that this happened spontaneously 2 days ago.  Neurological:     Mental Status: He is alert and oriented to person, place, and time.  Psychiatric:        Mood and Affect: Mood normal.        Behavior: Behavior normal.        Thought Content: Thought content normal.        Judgment: Judgment normal.          Assessment and Plan :   PDMP not reviewed this encounter.  1. Irritant dermatitis   2. Tick bite, initial encounter     Will avoid further antibiotic use.  Recommended triamcinolone cream to the left lower leg.  The thigh appears to be more of a contusion, petechial lesions.  Recommended follow-up with the PCP.  Counseled patient on potential for adverse effects with medications prescribed/recommended today, ER and return-to-clinic precautions discussed, patient verbalized understanding.    Wallis Bamberg, PA-C 11/17/22 1425

## 2022-11-17 NOTE — Discharge Instructions (Signed)
Apply the steroid cream twice daily for 1 week. Follow up with Dr. Kennyth Arnold.

## 2022-11-20 ENCOUNTER — Ambulatory Visit: Payer: Medicare Other | Admitting: Physical Therapy

## 2022-11-26 ENCOUNTER — Ambulatory Visit: Payer: Medicare Other | Attending: Family Medicine | Admitting: Physical Therapy

## 2022-11-26 ENCOUNTER — Encounter: Payer: Self-pay | Admitting: Physical Therapy

## 2022-11-26 DIAGNOSIS — M25552 Pain in left hip: Secondary | ICD-10-CM | POA: Insufficient documentation

## 2022-11-26 DIAGNOSIS — R262 Difficulty in walking, not elsewhere classified: Secondary | ICD-10-CM | POA: Insufficient documentation

## 2022-11-26 DIAGNOSIS — M25652 Stiffness of left hip, not elsewhere classified: Secondary | ICD-10-CM | POA: Insufficient documentation

## 2022-11-26 DIAGNOSIS — R278 Other lack of coordination: Secondary | ICD-10-CM | POA: Insufficient documentation

## 2022-11-26 DIAGNOSIS — M6281 Muscle weakness (generalized): Secondary | ICD-10-CM | POA: Diagnosis not present

## 2022-11-26 NOTE — Therapy (Signed)
OUTPATIENT PHYSICAL THERAPY LOWER EXTREMITY TREATMENT   Patient Name: Chad Norris MRN: [id number] DOB:October 04, 1931, 89 y.o., male Today's Date: 11/26/2022  END OF SESSION:  PT End of Session - 11/26/22 0931     Visit Number 5    Date for PT Re-Evaluation 01/28/23    PT Start Time 0927    PT Stop Time 1010    PT Time Calculation (min) 43 min    Activity Tolerance Patient tolerated treatment well    Behavior During Therapy WFL for tasks assessed/performed             Past Medical History:  Diagnosis Date   Colon polyps    COVID-19    Diverticulosis    Gout    Hemorrhoids    Hyperlipemia    Nephrolithiasis    Prostate cancer (HCC)    Stroke [facility])    Past Surgical History:  Procedure Laterality Date   CATARACT EXTRACTION W/ INTRAOCULAR LENS  IMPLANT, BILATERAL     bi-lat   CERVICAL SPINE SURGERY     COLONOSCOPY W/ POLYPECTOMY     Epidural steroid injections     3-4, Dr Danielle Dess   RETROPUBIC PROSTATECTOMY     SHOULDER SURGERY     left   Patient Active Problem List   Diagnosis Date Noted   Primary osteoarthritis of left hip 10/31/2022   Trochanteric bursitis, left hip 10/31/2022   Insect bite of left lower leg 10/29/2022   Tick bite of left lower leg 10/29/2022   Pain of left lower extremity 10/29/2022   Cellulitis of left lower leg 10/29/2022   Urine frequency 06/06/2022   Thrombocytopenia (HCC) 06/06/2021   Overactive bladder 06/05/2021   Skin lesions 01/25/2021   Nephrolithiasis    COVID-19    Pneumonia 08/24/2020   Cervical radiculopathy 06/11/2019   Sensorineural hearing loss, bilateral 05/21/2019   History of CVA (cerebrovascular accident) 08/18/2018   Hyperlipidemia 03/27/2018   Basal cell carcinoma (BCC) of ala nasi 03/11/2017   Prediabetes 03/11/2017   DDD (degenerative disc disease), lumbosacral 01/13/2014   PROSTATE CANCER, HX OF 06/21/2008   COLONIC POLYPS, HX OF 06/21/2008   NEPHROLITHIASIS, HX OF 05/07/2008   History of gout 10/09/2007     PCP: Pincus Sanes, MD  REFERRING PROVIDER: Rodolph Bong, MD  REFERRING DIAG: [phone] (ICD-10-CM) - Left hip pain   THERAPY DIAG:  Difficulty in walking, not elsewhere classified  Muscle weakness (generalized)  Other lack of coordination  Stiffness of left hip, not elsewhere classified  Pain in left hip  Rationale for Evaluation and Treatment: Rehabilitation  ONSET DATE: 10/31/22  SUBJECTIVE:   SUBJECTIVE STATEMENT: Patient reports that after treatment he usually takes a rest and then feels better moving.  PERTINENT HISTORY: Per referring physician report:  Relevant historical information: Hearing loss.[name]  History of gout.  History of a stroke with no residual defects. History of prostate cancer.  89 y.o. male with left lateral hip pain thought to be primarily due to hip abductor tendinopathy/trochanteric bursitis.  He does have significant hip arthritis seen on x-ray but his lack of anterior hip pain makes hip arthritis unlikely be the cause of today's pain.  Plan for greater trochanter injection and physical therapy referral.  Recheck in about 6 weeks.  Injection in hip 10/31/22  PAIN:  Are you having pain? Yes: NPRS scale: 0/10 Pain location: L hip Pain description: ache, fluctuates Aggravating factors: walking, climbing hills Relieving factors: rest  PRECAUTIONS: None  WEIGHT BEARING RESTRICTIONS: No  FALLS:  Has patient fallen in last 6 months? No  LIVING ENVIRONMENT: Lives with: lives with their family Lives in: House/apartment Stairs: Yes: Internal: 15 steps; on right going up and External: 3 steps; on left going up Has following equipment at home: None  OCCUPATION: golfing  PLOF: Independent  PATIENT GOALS: Decrease pain in hip to allow increased walking.  NEXT MD VISIT: about 5 weeks  OBJECTIVE:   DIAGNOSTIC FINDINGS: 10/31/22 x ray IMPRESSION: Degenerative changes in the bilateral hips, left greater than right. Partial joint space loss  on the left.  COGNITION: Overall cognitive status: Within functional limits for tasks assessed     SENSATION: WFL  MUSCLE LENGTH: Hamstrings: B hamstrings 45 Thomas test: B hip flexor tightness noted.  POSTURE: decreased lumbar lordosis and decreased thoracic kyphosis  PALPATION: TTP on L piriformis, no other TTP noted.  LOWER EXTREMITY ROM: B hips are tight in all planes, L > R, otherwise WFL.   LOWER EXTREMITY MMT: BLE strength is 4/5   LOWER EXTREMITY SPECIAL TESTS:  Hip special tests: Hip scouring test: positive  and Anterior hip impingement test: positive  SI screen largely (-), Pain in piriformis with testing, but it appears to be due to the severity of spasm.  FUNCTIONAL TESTS:  5 times sit to stand: 13.06  GAIT: Distance walked: In clinic distances Assistive device utilized: None Level of assistance: Complete Independence Comments: Patient reports that his hip pain often grabs him when walking, increases when walking uphill or on uneven terrain.   TODAY'S TREATMENT:                                                                                                                              DATE:  11/26/22 NuStep L5 x 6 minutes Supine lower body mobilization with SKTC with knee ext/flex, then DKTC, LTR STM to L piriformis with deep cross friction massage F/B IA deep stretch. Supine piriformis stretch Supine clamshells with G tband, 2 x 10 reps, bridge with Tband resistance at knees. Sit to stand with g tband at knees 2 x 10 Seated piriformis stretch Ionto patch applied to L pirirformis-dexo, 4ml x 4 hours.  11/14/22 NuStep L5 x 6 minutes Deep STM to L piriformis with tennis ball and massage gun. Educated patient to using massage gun and tennis ball in sup or sit Stretch to piriformis and gluts in supine. 3 x 15 sec Supine strengthening- clamshells with G tband, bridge, 2x 10 each Sit to stand on air ex x 10, then 10 mini squats on airex, tended to lean back a  bit.  11/10/20 NuStep L5 x 6 min Resisted gait 20lb all directions x3 each Hamstring curl 25lb 2x10 Leg Ext 10b 2x10 Hip add ball squeeze 2x10 Hip abd w/ manual resistance MHP applied to lumbar spine MT PROM to R hip in all dorections with end range holds STM to Lateral L hip  11/09/22 Bike L1 x 2 min increase L  hip pain NuStep L 5 x 5 min MT PROM to R hip in all dorections with end range holds  STM to Lateral L hip Bridges  LE on Pball bridges, Oblk, K2C  Hip Add ball squeeze Hip abd w/ manual resistance   Hamstring curls 20lb 2x10 Leg Ext 5lb 2x10 Lateral 4 in step ups    PATIENT EDUCATION:  Education details: POC, HEP Person educated: Patient Education method: Health visitor Education comprehension: verbalized understanding and returned demonstration  HOME EXERCISE PROGRAM: [id number]  ASSESSMENT:  CLINICAL IMPRESSION: Provided deep STM to L piriformis followed by stretching and strengthening to hip muscles. Updated HEP with additional stretches and reviewed deep tissue mobs for piriformis with tennis ball as he reports the muscle improves after treatment, but returns. Initiated Iontophoresis.  OBJECTIVE IMPAIRMENTS: Abnormal gait, decreased activity tolerance, decreased balance, decreased endurance, difficulty walking, decreased ROM, decreased strength, increased muscle spasms, impaired flexibility, and pain.   ACTIVITY LIMITATIONS: bending, sitting, squatting, stairs, and locomotion level  PARTICIPATION LIMITATIONS: cleaning, shopping, and community activity  PERSONAL FACTORS: Age and Past/current experiences are also affecting patient's functional outcome.   REHAB POTENTIAL: Good  CLINICAL DECISION MAKING: Evolving/moderate complexity  EVALUATION COMPLEXITY: Moderate   GOALS: Goals reviewed with patient? Yes  SHORT TERM GOALS: Target date: 11/19/22 I with initial HEP Baseline: Goal status: 11/14/22, met  LONG TERM GOALS: Target  date: 01/28/23  I with final HEP Baseline:  Goal status: INITIAL  2.  Decrease 5 x STS to > 12 sec, with no pain in L hip Baseline: 13.06 Goal status: INITIAL  3.  Patient will be able to walk x at least 500' on level and unlevel surfaces I, with L hip pain < 3/10 Baseline: 6/10, limits walking. Goal status: 11/26/22-Still walking with a limp due to pain  4.  Increase pain free L hip ROM to [facility] Baseline: Limited ROM, painful. Goal status: 11/26/22- Initiated additional pain relief techniques. ongoing  5.  Increase B hip strength to5/5 Baseline:  Goal status: 11/14/22-Added strengthening exercises to HEP, ongoing.  PLAN:  PT FREQUENCY: 1-2x/week  PT DURATION: 12 weeks  PLANNED INTERVENTIONS: Therapeutic exercises, Therapeutic activity, Neuromuscular re-education, Balance training, Gait training, Patient/Family education, Self Care, Joint mobilization, Stair training, Dry Needling, Electrical stimulation, Cryotherapy, Moist heat, Ionotophoresis 4mg /ml Dexamethasone, and Manual therapy  PLAN FOR NEXT SESSION: Update HEP, Deep tissue work on piriformis, Ionto?, DN   Iona Beard, DPT 11/26/2022, 10:18 AM

## 2022-11-29 ENCOUNTER — Ambulatory Visit: Payer: Medicare Other | Admitting: Physical Therapy

## 2022-12-03 ENCOUNTER — Ambulatory Visit: Payer: Medicare Other | Admitting: Physical Therapy

## 2022-12-03 ENCOUNTER — Encounter: Payer: Medicare Other | Admitting: Physical Therapy

## 2022-12-03 ENCOUNTER — Encounter: Payer: Self-pay | Admitting: Physical Therapy

## 2022-12-03 DIAGNOSIS — R278 Other lack of coordination: Secondary | ICD-10-CM | POA: Diagnosis not present

## 2022-12-03 DIAGNOSIS — M25652 Stiffness of left hip, not elsewhere classified: Secondary | ICD-10-CM | POA: Diagnosis not present

## 2022-12-03 DIAGNOSIS — M6281 Muscle weakness (generalized): Secondary | ICD-10-CM

## 2022-12-03 DIAGNOSIS — R262 Difficulty in walking, not elsewhere classified: Secondary | ICD-10-CM | POA: Diagnosis not present

## 2022-12-03 DIAGNOSIS — M25552 Pain in left hip: Secondary | ICD-10-CM

## 2022-12-03 NOTE — Therapy (Signed)
OUTPATIENT PHYSICAL THERAPY LOWER EXTREMITY TREATMENT   Patient Name: Chad Norris MRN: [id number] DOB:06-07-1932, 89 y.o., male Today's Date: 12/03/2022  END OF SESSION:  PT End of Session - 12/03/22 0925     Visit Number 6    Date for PT Re-Evaluation 01/28/23    PT Start Time 0925    PT Stop Time 1010    PT Time Calculation (min) 45 min    Activity Tolerance Patient tolerated treatment well    Behavior During Therapy WFL for tasks assessed/performed             Past Medical History:  Diagnosis Date   Colon polyps    COVID-19    Diverticulosis    Gout    Hemorrhoids    Hyperlipemia    Nephrolithiasis    Prostate cancer (HCC)    Stroke [facility])    Past Surgical History:  Procedure Laterality Date   CATARACT EXTRACTION W/ INTRAOCULAR LENS  IMPLANT, BILATERAL     bi-lat   CERVICAL SPINE SURGERY     COLONOSCOPY W/ POLYPECTOMY     Epidural steroid injections     3-4, Dr Danielle Dess   RETROPUBIC PROSTATECTOMY     SHOULDER SURGERY     left   Patient Active Problem List   Diagnosis Date Noted   Primary osteoarthritis of left hip 10/31/2022   Trochanteric bursitis, left hip 10/31/2022   Insect bite of left lower leg 10/29/2022   Tick bite of left lower leg 10/29/2022   Pain of left lower extremity 10/29/2022   Cellulitis of left lower leg 10/29/2022   Urine frequency 06/06/2022   Thrombocytopenia (HCC) 06/06/2021   Overactive bladder 06/05/2021   Skin lesions 01/25/2021   Nephrolithiasis    COVID-19    Pneumonia 08/24/2020   Cervical radiculopathy 06/11/2019   Sensorineural hearing loss, bilateral 05/21/2019   History of CVA (cerebrovascular accident) 08/18/2018   Hyperlipidemia 03/27/2018   Basal cell carcinoma (BCC) of ala nasi 03/11/2017   Prediabetes 03/11/2017   DDD (degenerative disc disease), lumbosacral 01/13/2014   PROSTATE CANCER, HX OF 06/21/2008   COLONIC POLYPS, HX OF 06/21/2008   NEPHROLITHIASIS, HX OF 05/07/2008   History of gout 10/09/2007     PCP: Pincus Sanes, MD  REFERRING PROVIDER: Rodolph Bong, MD  REFERRING DIAG: [phone] (ICD-10-CM) - Left hip pain   THERAPY DIAG:  Difficulty in walking, not elsewhere classified  Other lack of coordination  Stiffness of left hip, not elsewhere classified  Pain in left hip  Muscle weakness (generalized)  Rationale for Evaluation and Treatment: Rehabilitation  ONSET DATE: 10/31/22  SUBJECTIVE:   SUBJECTIVE STATEMENT: Hanging in their. Did a lot of walking this weekend.   PERTINENT HISTORY: Per referring physician report:  Relevant historical information: Hearing loss.[name]  History of gout.  History of a stroke with no residual defects. History of prostate cancer.  89 y.o. male with left lateral hip pain thought to be primarily due to hip abductor tendinopathy/trochanteric bursitis.  He does have significant hip arthritis seen on x-ray but his lack of anterior hip pain makes hip arthritis unlikely be the cause of today's pain.  Plan for greater trochanter injection and physical therapy referral.  Recheck in about 6 weeks.  Injection in hip 10/31/22  PAIN:  Are you having pain? Yes: NPRS scale: 4/10 Pain location: L hip Pain description: ache, fluctuates Aggravating factors: walking, climbing hills Relieving factors: rest  PRECAUTIONS: None  WEIGHT BEARING RESTRICTIONS: No  FALLS:  Has patient  fallen in last 6 months? No  LIVING ENVIRONMENT: Lives with: lives with their family Lives in: House/apartment Stairs: Yes: Internal: 15 steps; on right going up and External: 3 steps; on left going up Has following equipment at home: None  OCCUPATION: golfing  PLOF: Independent  PATIENT GOALS: Decrease pain in hip to allow increased walking.  NEXT MD VISIT: about 5 weeks  OBJECTIVE:   DIAGNOSTIC FINDINGS: 10/31/22 x ray IMPRESSION: Degenerative changes in the bilateral hips, left greater than right. Partial joint space loss on the left.  COGNITION: Overall  cognitive status: Within functional limits for tasks assessed     SENSATION: WFL  MUSCLE LENGTH: Hamstrings: B hamstrings 45 Thomas test: B hip flexor tightness noted.  POSTURE: decreased lumbar lordosis and decreased thoracic kyphosis  PALPATION: TTP on L piriformis, no other TTP noted.  LOWER EXTREMITY ROM: B hips are tight in all planes, L > R, otherwise WFL.   LOWER EXTREMITY MMT: BLE strength is 4/5   LOWER EXTREMITY SPECIAL TESTS:  Hip special tests: Hip scouring test: positive  and Anterior hip impingement test: positive  SI screen largely (-), Pain in piriformis with testing, but it appears to be due to the severity of spasm.  FUNCTIONAL TESTS:  5 times sit to stand: 13.06  GAIT: Distance walked: In clinic distances Assistive device utilized: None Level of assistance: Complete Independence Comments: Patient reports that his hip pain often grabs him when walking, increases when walking uphill or on uneven terrain.   TODAY'S TREATMENT:                                                                                                                              DATE:  12/03/22 NuStep L5 x 6 minutes Supine lower body mobilization with SKTC with knee ext/flex, then DKTC, LTR STM to L piriformis  Supine piriformis stretch with thera gun STM  Supine ball squeeze and bridge LE on Pball bridges, Oblq, K2C  Sit to stand with g tband at knees 2 x 10 4in step ups x 10 each  Ionto patch applied to L pirirformis-dexo, 1ml x 4 hours.  11/26/22 NuStep L5 x 6 minutes Supine lower body mobilization with SKTC with knee ext/flex, then DKTC, LTR STM to L piriformis with deep cross friction massage F/B IA deep stretch. Supine piriformis stretch Supine clamshells with G tband, 2 x 10 reps, bridge with Tband resistance at knees. Sit to stand with g tband at knees 2 x 10 Seated piriformis stretch Ionto patch applied to L pirirformis-dexo, 4ml x 4 hours.  11/14/22 NuStep L5 x 6  minutes Deep STM to L piriformis with tennis ball and massage gun. Educated patient to using massage gun and tennis ball in sup or sit Stretch to piriformis and gluts in supine. 3 x 15 sec Supine strengthening- clamshells with G tband, bridge, 2x 10 each Sit to stand on air ex x 10, then 10 mini squats on airex, tended  to lean back a bit.  11/10/20 NuStep L5 x 6 min Resisted gait 20lb all directions x3 each Hamstring curl 25lb 2x10 Leg Ext 10b 2x10 Hip add ball squeeze 2x10 Hip abd w/ manual resistance MHP applied to lumbar spine MT PROM to R hip in all dorections with end range holds STM to Lateral L hip  11/09/22 Bike L1 x 2 min increase L hip pain NuStep L 5 x 5 min MT PROM to R hip in all dorections with end range holds  STM to Lateral L hip Bridges  LE on Pball bridges, Oblk, K2C  Hip Add ball squeeze Hip abd w/ manual resistance   Hamstring curls 20lb 2x10 Leg Ext 5lb 2x10 Lateral 4 in step ups    PATIENT EDUCATION:  Education details: POC, HEP Person educated: Patient Education method: Health visitor Education comprehension: verbalized understanding and returned demonstration  HOME EXERCISE PROGRAM: [id number]  ASSESSMENT:  CLINICAL IMPRESSION: Continued with deep STM to L piriformis followed by stretching and strengthening to hip muscles. Pt I very tight in the hips, he also is guarded with passive LE movements. Pt is HOA making compliance with cues difficult. Core weakness noted with supine pball interventions. No pain post session. Continued Iontophoresis.  OBJECTIVE IMPAIRMENTS: Abnormal gait, decreased activity tolerance, decreased balance, decreased endurance, difficulty walking, decreased ROM, decreased strength, increased muscle spasms, impaired flexibility, and pain.   ACTIVITY LIMITATIONS: bending, sitting, squatting, stairs, and locomotion level  PARTICIPATION LIMITATIONS: cleaning, shopping, and community activity  PERSONAL  FACTORS: Age and Past/current experiences are also affecting patient's functional outcome.   REHAB POTENTIAL: Good  CLINICAL DECISION MAKING: Evolving/moderate complexity  EVALUATION COMPLEXITY: Moderate   GOALS: Goals reviewed with patient? Yes  SHORT TERM GOALS: Target date: 11/19/22 I with initial HEP Baseline: Goal status: 11/14/22, met  LONG TERM GOALS: Target date: 01/28/23  I with final HEP Baseline:  Goal status: INITIAL  2.  Decrease 5 x STS to > 12 sec, with no pain in L hip Baseline: 13.06 Goal status: INITIAL  3.  Patient will be able to walk x at least 500' on level and unlevel surfaces I, with L hip pain < 3/10 Baseline: 6/10, limits walking. Goal status: 11/26/22-Still walking with a limp due to pain  4.  Increase pain free L hip ROM to [facility] Baseline: Limited ROM, painful. Goal status: 11/26/22- Initiated additional pain relief techniques. ongoing  5.  Increase B hip strength to5/5 Baseline:  Goal status: 11/14/22-Added strengthening exercises to HEP, ongoing.  PLAN:  PT FREQUENCY: 1-2x/week  PT DURATION: 12 weeks  PLANNED INTERVENTIONS: Therapeutic exercises, Therapeutic activity, Neuromuscular re-education, Balance training, Gait training, Patient/Family education, Self Care, Joint mobilization, Stair training, Dry Needling, Electrical stimulation, Cryotherapy, Moist heat, Ionotophoresis 4mg /ml Dexamethasone, and Manual therapy  PLAN FOR NEXT SESSION: Update HEP, Deep tissue work on piriformis, Ionto?, DN   Iona Beard, DPT 12/03/2022, 9:26 AM

## 2022-12-04 ENCOUNTER — Other Ambulatory Visit: Payer: Self-pay | Admitting: Internal Medicine

## 2022-12-05 ENCOUNTER — Encounter: Payer: Self-pay | Admitting: Physical Therapy

## 2022-12-05 ENCOUNTER — Ambulatory Visit: Payer: Medicare Other | Admitting: Physical Therapy

## 2022-12-05 DIAGNOSIS — M25552 Pain in left hip: Secondary | ICD-10-CM

## 2022-12-05 DIAGNOSIS — R278 Other lack of coordination: Secondary | ICD-10-CM

## 2022-12-05 DIAGNOSIS — M25652 Stiffness of left hip, not elsewhere classified: Secondary | ICD-10-CM

## 2022-12-05 DIAGNOSIS — M6281 Muscle weakness (generalized): Secondary | ICD-10-CM

## 2022-12-05 DIAGNOSIS — R262 Difficulty in walking, not elsewhere classified: Secondary | ICD-10-CM

## 2022-12-05 NOTE — Therapy (Signed)
OUTPATIENT PHYSICAL THERAPY LOWER EXTREMITY TREATMENT   Patient Name: Chad Norris MRN: [id number] DOB:April 10, 1932, 89 y.o., male Today's Date: 12/05/2022  END OF SESSION:  PT End of Session - 12/05/22 1058     Visit Number 7    Date for PT Re-Evaluation 01/28/23    PT Start Time 1100    PT Stop Time 1145    PT Time Calculation (min) 45 min    Activity Tolerance Patient tolerated treatment well    Behavior During Therapy WFL for tasks assessed/performed             Past Medical History:  Diagnosis Date   Colon polyps    COVID-19    Diverticulosis    Gout    Hemorrhoids    Hyperlipemia    Nephrolithiasis    Prostate cancer (HCC)    Stroke [facility])    Past Surgical History:  Procedure Laterality Date   CATARACT EXTRACTION W/ INTRAOCULAR LENS  IMPLANT, BILATERAL     bi-lat   CERVICAL SPINE SURGERY     COLONOSCOPY W/ POLYPECTOMY     Epidural steroid injections     3-4, Dr Danielle Dess   RETROPUBIC PROSTATECTOMY     SHOULDER SURGERY     left   Patient Active Problem List   Diagnosis Date Noted   Primary osteoarthritis of left hip 10/31/2022   Trochanteric bursitis, left hip 10/31/2022   Insect bite of left lower leg 10/29/2022   Tick bite of left lower leg 10/29/2022   Pain of left lower extremity 10/29/2022   Cellulitis of left lower leg 10/29/2022   Urine frequency 06/06/2022   Thrombocytopenia (HCC) 06/06/2021   Overactive bladder 06/05/2021   Skin lesions 01/25/2021   Nephrolithiasis    COVID-19    Pneumonia 08/24/2020   Cervical radiculopathy 06/11/2019   Sensorineural hearing loss, bilateral 05/21/2019   History of CVA (cerebrovascular accident) 08/18/2018   Hyperlipidemia 03/27/2018   Basal cell carcinoma (BCC) of [place] 03/11/2017   Prediabetes 03/11/2017   DDD (degenerative disc disease), lumbosacral 01/13/2014   PROSTATE CANCER, HX OF 06/21/2008   COLONIC POLYPS, HX OF 06/21/2008   NEPHROLITHIASIS, HX OF 05/07/2008   History of gout 10/09/2007     PCP: Pincus Sanes, MD  REFERRING PROVIDER: Rodolph Bong, MD  REFERRING DIAG: [phone] (ICD-10-CM) - Left hip pain   THERAPY DIAG:  Difficulty in walking, not elsewhere classified  Other lack of coordination  Stiffness of left hip, not elsewhere classified  Pain in left hip  Muscle weakness (generalized)  Rationale for Evaluation and Treatment: Rehabilitation  ONSET DATE: 10/31/22  SUBJECTIVE:   SUBJECTIVE STATEMENT: "Ok" Tried to do some exercises this morning but got tired. Hip is pretty good   PERTINENT HISTORY: Per referring physician report:  Relevant historical information: Hearing loss.[name]  History of gout.  History of a stroke with no residual defects. History of prostate cancer.  89 y.o. male with left lateral hip pain thought to be primarily due to hip abductor tendinopathy/trochanteric bursitis.  He does have significant hip arthritis seen on x-ray but his lack of anterior hip pain makes hip arthritis unlikely be the cause of today's pain.  Plan for greater trochanter injection and physical therapy referral.  Recheck in about 6 weeks.  Injection in hip 10/31/22  PAIN:  Are you having pain? Yes: NPRS scale: 4/10 Pain location: L hip Pain description: ache, fluctuates Aggravating factors: walking, climbing hills Relieving factors: rest  PRECAUTIONS: None  WEIGHT BEARING RESTRICTIONS: No  FALLS:  Has patient fallen in last 6 months? No  LIVING ENVIRONMENT: Lives with: lives with their family Lives in: House/apartment Stairs: Yes: Internal: 15 steps; on right going up and External: 3 steps; on left going up Has following equipment at home: None  OCCUPATION: golfing  PLOF: Independent  PATIENT GOALS: Decrease pain in hip to allow increased walking.  NEXT MD VISIT: about 5 weeks  OBJECTIVE:   DIAGNOSTIC FINDINGS: 10/31/22 x ray IMPRESSION: Degenerative changes in the bilateral hips, left greater than right. Partial joint space loss on the  left.  COGNITION: Overall cognitive status: Within functional limits for tasks assessed     SENSATION: WFL  MUSCLE LENGTH: Hamstrings: B hamstrings 45 Thomas test: B hip flexor tightness noted.  POSTURE: decreased lumbar lordosis and decreased thoracic kyphosis  PALPATION: TTP on L piriformis, no other TTP noted.  LOWER EXTREMITY ROM: B hips are tight in all planes, L > R, otherwise WFL.   LOWER EXTREMITY MMT: BLE strength is 4/5   LOWER EXTREMITY SPECIAL TESTS:  Hip special tests: Hip scouring test: positive  and Anterior hip impingement test: positive  SI screen largely (-), Pain in piriformis with testing, but it appears to be due to the severity of spasm.  FUNCTIONAL TESTS:  5 times sit to stand: 13.06  GAIT: Distance walked: In clinic distances Assistive device utilized: None Level of assistance: Complete Independence Comments: Patient reports that his hip pain often grabs him when walking, increases when walking uphill or on uneven terrain.   TODAY'S TREATMENT:                                                                                                                              DATE:  12/05/22 Gait 3 laps ~ 312ft for warm up Resisted side steps 30lb x5 each HS curls 25lb 2x10 Leg Ext 10lb 2x12 LE on Pball bridges, Oblq, K2C  Supine ball squeeze and bridge Supine piriformis stretch with thera gun STM  Ionto patch applied to L pirirformis-dexo, 1ml x 4 hours. Patch #3  12/03/22 NuStep L5 x 6 minutes Supine lower body mobilization with SKTC with knee ext/flex, then DKTC, LTR STM to L piriformis  Supine piriformis stretch with thera gun STM  Supine ball squeeze and bridge LE on Pball bridges, Oblq, K2C  Sit to stand with g tband at knees 2 x 10 4in step ups x 10 each  Ionto patch applied to L pirirformis-dexo, 1ml x 4 hours.  11/26/22 NuStep L5 x 6 minutes Supine lower body mobilization with SKTC with knee ext/flex, then DKTC, LTR STM to L piriformis  with deep cross friction massage F/B IA deep stretch. Supine piriformis stretch Supine clamshells with G tband, 2 x 10 reps, bridge with Tband resistance at knees. Sit to stand with g tband at knees 2 x 10 Seated piriformis stretch Ionto patch applied to L pirirformis-dexo, 4ml x 4 hours.  11/14/22 NuStep L5 x 6 minutes  Deep STM to L piriformis with tennis ball and massage gun. Educated patient to using massage gun and tennis ball in sup or sit Stretch to piriformis and gluts in supine. 3 x 15 sec Supine strengthening- clamshells with G tband, bridge, 2x 10 each Sit to stand on air ex x 10, then 10 mini squats on airex, tended to lean back a bit.  11/10/20 NuStep L5 x 6 min Resisted gait 20lb all directions x3 each Hamstring curl 25lb 2x10 Leg Ext 10b 2x10 Hip add ball squeeze 2x10 Hip abd w/ manual resistance MHP applied to lumbar spine MT PROM to R hip in all dorections with end range holds STM to Lateral L hip  11/09/22 Bike L1 x 2 min increase L hip pain NuStep L 5 x 5 min MT PROM to R hip in all dorections with end range holds  STM to Lateral L hip Bridges  LE on Pball bridges, Oblk, K2C  Hip Add ball squeeze Hip abd w/ manual resistance   Hamstring curls 20lb 2x10 Leg Ext 5lb 2x10 Lateral 4 in step ups    PATIENT EDUCATION:  Education details: POC, HEP Person educated: Patient Education method: Health visitor Education comprehension: verbalized understanding and returned demonstration  HOME EXERCISE PROGRAM: [id number]  ASSESSMENT:  CLINICAL IMPRESSION: Continued with deep STM to L piriformis and stretching and strengthening to hip muscles. Cues for eccentric control needed with resisted side steps. Pt I very tight in the hips, he also is guarded with passive LE movements. Pt is HOA making compliance with cues difficult. Core weakness noted with supine pball interventions. No pain post session. Continued Iontophoresis.  OBJECTIVE  IMPAIRMENTS: Abnormal gait, decreased activity tolerance, decreased balance, decreased endurance, difficulty walking, decreased ROM, decreased strength, increased muscle spasms, impaired flexibility, and pain.   ACTIVITY LIMITATIONS: bending, sitting, squatting, stairs, and locomotion level  PARTICIPATION LIMITATIONS: cleaning, shopping, and community activity  PERSONAL FACTORS: Age and Past/current experiences are also affecting patient's functional outcome.   REHAB POTENTIAL: Good  CLINICAL DECISION MAKING: Evolving/moderate complexity  EVALUATION COMPLEXITY: Moderate   GOALS: Goals reviewed with patient? Yes  SHORT TERM GOALS: Target date: 11/19/22 I with initial HEP Baseline: Goal status: 11/14/22, met  LONG TERM GOALS: Target date: 01/28/23  I with final HEP Baseline:  Goal status: INITIAL  2.  Decrease 5 x STS to > 12 sec, with no pain in L hip Baseline: 13.06 Goal status: INITIAL  3.  Patient will be able to walk x at least 500' on level and unlevel surfaces I, with L hip pain < 3/10 Baseline: 6/10, limits walking. Goal status: 11/26/22-Still walking with a limp due to pain  4.  Increase pain free L hip ROM to [facility] Baseline: Limited ROM, painful. Goal status: 11/26/22- Initiated additional pain relief techniques. ongoing  5.  Increase B hip strength to5/5 Baseline:  Goal status: 11/14/22-Added strengthening exercises to HEP, ongoing.  PLAN:  PT FREQUENCY: 1-2x/week  PT DURATION: 12 weeks  PLANNED INTERVENTIONS: Therapeutic exercises, Therapeutic activity, Neuromuscular re-education, Balance training, Gait training, Patient/Family education, Self Care, Joint mobilization, Stair training, Dry Needling, Electrical stimulation, Cryotherapy, Moist heat, Ionotophoresis 4mg /ml Dexamethasone, and Manual therapy  PLAN FOR NEXT SESSION: Update HEP, Deep tissue work on piriformis, Ionto?, DN   Iona Beard, DPT 12/05/2022, 10:59 AM

## 2022-12-07 NOTE — Progress Notes (Addendum)
Chad Payor, PhD, LAT, ATC acting as a scribe for [name], MD.  Chad Norris is a 89 y.o. male who presents to [facility] today for 6-wk f/u L hip pain. Pt was last seen by Dr. Denyse Amass on 10/31/22 and was given a L GT steroid injection and was referred to PT, completing 6 visits. Today, pt reports prior steroid injection was short-lived. He feels like PT is helping. He is not working on [employer]. He notes increased pain w/ walking.   Pain is primarily in the lateral hip not much in the anterior hip.  Dx imaging: 10/31/22 L hip XR  Pertinent review of systems: No fevers or chills  Relevant historical information: Hearing loss.  History of prostate cancer.   Exam:  BP 128/74   Pulse (!) 51   Ht 6' (1.829 m)   Wt 189 lb (85.7 kg)   SpO2 97%   BMI 25.63 kg/m  General: Well Developed, well nourished, and in no acute distress.   MSK: Left  hip normal-appearing normal motion.  Mildly tender palpation greater trochanter.    Lab and Radiology Results  Procedure: Real-time Ultrasound Guided Injection of left hip femoral acetabular joint anterior approach Device: Philips Affiniti 50G Images permanently stored and available for review in PACS Verbal informed consent obtained.  Discussed risks and benefits of procedure. Warned about infection, bleeding, hyperglycemia damage to structures among others. Patient expresses understanding and agreement Time-out conducted.   Noted no overlying erythema, induration, or other signs of local infection.   Skin prepped in a sterile fashion.   Local anesthesia: Topical Ethyl chloride.   With sterile technique and under real time ultrasound guidance: 40 mg of Kenalog and 2 ml Marcaine injected into hip joint. Fluid seen entering the joint capsule.   Completed without difficulty   Pain immediately resolved suggesting accurate placement of the medication.   Advised to call if fevers/chills, erythema, induration,  drainage, or persistent bleeding.   Images permanently stored and available for review in the ultrasound unit.  Impression: Technically successful ultrasound guided injection.   EXAM: DG HIP (WITH OR WITHOUT PELVIS) 2-3V LEFT   COMPARISON:  None Available.   FINDINGS: Degenerative changes in the bilateral hips, left greater than right. Partial joint space loss on the left. No fracture or dislocation. No bony lesions. Surgical clips in the pelvis.   IMPRESSION: Degenerative changes in the bilateral hips, left greater than right. Partial joint space loss on the left.     Electronically Signed   By: Gerome Sam III M.D.   On: 11/01/2022 17:16   I, [name], personally (independently) visualized and performed the interpretation of the images attached in this note.       Assessment and Plan: 89 y.o. male with left hip pain.  He does have significant DJD seen on recent x-ray.  He has not responded to typical treatment for trochanteric bursitis.  Planned today for a diagnostic and potentially therapeutic intra-articular right hip injection.  He had immediate pain relief with intra-articular injection and indicates that this is his primary pain generator.  If this shot does not last longer than 3 months the next step is probably surgical consultation.  I can repeat this injection every 3 months.   PDMP not reviewed this encounter. Orders Placed This Encounter  Procedures   [place] LIMITED JOINT SPACE STRUCTURES LOW LEFT(NO LINKED CHARGES)    Order Specific Question:   Reason for Exam (SYMPTOM  OR  DIAGNOSIS REQUIRED)    Answer:   left hip pain    Order Specific Question:   Preferred imaging location?    Answer:    [facility]   No orders of the defined types were placed in this encounter.    Discussed warning signs or symptoms. Please see discharge instructions. Patient expresses understanding.   The above documentation has been reviewed and is  accurate and complete [name], M.D.

## 2022-12-10 ENCOUNTER — Other Ambulatory Visit: Payer: Self-pay

## 2022-12-10 ENCOUNTER — Encounter: Payer: Self-pay | Admitting: Internal Medicine

## 2022-12-10 ENCOUNTER — Ambulatory Visit: Payer: Medicare Other | Admitting: Family Medicine

## 2022-12-10 ENCOUNTER — Telehealth: Payer: Self-pay | Admitting: Family Medicine

## 2022-12-10 VITALS — BP 128/74 | HR 51 | Ht 72.0 in | Wt 189.0 lb

## 2022-12-10 DIAGNOSIS — M25552 Pain in left hip: Secondary | ICD-10-CM | POA: Diagnosis not present

## 2022-12-10 DIAGNOSIS — M1612 Unilateral primary osteoarthritis, left hip: Secondary | ICD-10-CM | POA: Diagnosis not present

## 2022-12-10 NOTE — Patient Instructions (Signed)
      Blood work was ordered.   The lab is on the first floor.    Medications changes include :  stop the aspirin for now     A referral was ordered for hematology/oncology and someone will call you to schedule an appointment.

## 2022-12-10 NOTE — Telephone Encounter (Signed)
Patient said that he forgot to ask Dr Denyse Amass if he felt that physical therapy would be helpful? He said that he would do it if it was necessary.

## 2022-12-10 NOTE — Patient Instructions (Addendum)
Thank you for coming in today.   You received an injection today. Seek immediate medical attention if the joint becomes red, extremely painful, or is oozing fluid.   It seems like the pain is coming from the hip joint.  If it does not last longer than 3 months I would recommend a hip replacement.   If it lasts longer than 3 months we can do the shot again.

## 2022-12-10 NOTE — Progress Notes (Unsigned)
    Subjective:    Patient ID: Chad Norris, male    DOB: Sep 18, 1931, 89 y.o.   MRN: [id number]      HPI Chad Norris is here for No chief complaint on file.        Medications and allergies reviewed with patient and updated if appropriate.  Current Outpatient Medications on File Prior to Visit  Medication Sig Dispense Refill   acetaminophen (TYLENOL) 325 MG tablet Take 2 tablets (650 mg total) by mouth every 6 (six) hours as needed for moderate pain. 30 tablet 0   aspirin EC 81 MG tablet Take 1 tablet (81 mg total) by mouth daily. Swallow whole. 30 tablet 12   atorvastatin (LIPITOR) 40 MG tablet Take 1 tablet (40 mg total) by mouth daily. 90 tablet 0   Multiple Vitamins-Minerals (ADULT ONE DAILY GUMMIES) CHEW Chew 2 tablets by mouth daily. men     triamcinolone cream (KENALOG) 0.1 % APPLY TOPICALLY TO THE AFFECTED AREA TWICE DAILY 30 g 0   No current facility-administered medications on file prior to visit.    Review of Systems     Objective:  There were no vitals filed for this visit. BP Readings from Last 3 Encounters:  12/10/22 128/74  11/17/22 (!) 147/83  10/31/22 130/72   Wt Readings from Last 3 Encounters:  12/10/22 189 lb (85.7 kg)  10/31/22 198 lb 14.4 oz (90.2 kg)  10/29/22 191 lb (86.6 kg)   There is no height or weight on file to calculate BMI.    Physical Exam         Assessment & Plan:    See Problem List for Assessment and Plan of chronic medical problems.

## 2022-12-10 NOTE — Telephone Encounter (Signed)
Forwarding to Dr. Denyse Amass to advise if PT would be helpful. Per visit note, recommend consult for hip replacement if no improvement with steroid injection.   PT referral pending if appropriate.

## 2022-12-11 ENCOUNTER — Ambulatory Visit (INDEPENDENT_AMBULATORY_CARE_PROVIDER_SITE_OTHER): Payer: Medicare Other | Admitting: Internal Medicine

## 2022-12-11 VITALS — BP 116/74 | HR 70 | Temp 98.2°F | Ht 72.0 in | Wt 188.0 lb

## 2022-12-11 DIAGNOSIS — D696 Thrombocytopenia, unspecified: Secondary | ICD-10-CM | POA: Diagnosis not present

## 2022-12-11 LAB — CBC WITH DIFFERENTIAL/PLATELET
Basophils Absolute: 0 10*3/uL (ref 0.0–0.1)
Basophils Relative: 0 % (ref 0.0–3.0)
Eosinophils Absolute: 0 10*3/uL (ref 0.0–0.7)
Eosinophils Relative: 0 % (ref 0.0–5.0)
HCT: 39.1 % (ref 39.0–52.0)
Hemoglobin: 12.9 g/dL — ABNORMAL LOW (ref 13.0–17.0)
Lymphocytes Relative: 12.3 % (ref 12.0–46.0)
Lymphs Abs: 1 10*3/uL (ref 0.7–4.0)
MCHC: 33 g/dL (ref 30.0–36.0)
MCV: 95.9 fl (ref 78.0–100.0)
Monocytes Absolute: 0.7 10*3/uL (ref 0.1–1.0)
Monocytes Relative: 8 % (ref 3.0–12.0)
Neutro Abs: 6.6 10*3/uL (ref 1.4–7.7)
Neutrophils Relative %: 79.7 % — ABNORMAL HIGH (ref 43.0–77.0)
Platelets: 98 10*3/uL — ABNORMAL LOW (ref 150.0–400.0)
RBC: 4.08 Mil/uL — ABNORMAL LOW (ref 4.22–5.81)
RDW: 13.8 % (ref 11.5–15.5)
WBC: 8.2 10*3/uL (ref 4.0–10.5)

## 2022-12-11 NOTE — Assessment & Plan Note (Signed)
Chronic Platelet count has gotten slightly lower over the past year or so He is now experiencing some easy bruising Will have him stop the aspirin.  Discussed with him and his wife that ideally he should be on the aspirin for prevention of stroke, but at this point I am concerned about the bruising-will see what hematology says Check CBC today Referral to hematology/oncology-last saw them in January 2023

## 2022-12-11 NOTE — Telephone Encounter (Signed)
I think it is okay to stop physical therapy for now.

## 2022-12-14 ENCOUNTER — Other Ambulatory Visit: Payer: Self-pay | Admitting: Family

## 2022-12-14 ENCOUNTER — Inpatient Hospital Stay: Payer: Medicare Other | Admitting: Family

## 2022-12-14 ENCOUNTER — Inpatient Hospital Stay: Payer: Medicare Other | Attending: Hematology & Oncology

## 2022-12-14 ENCOUNTER — Encounter: Payer: Self-pay | Admitting: Family

## 2022-12-14 VITALS — BP 122/78 | HR 54 | Temp 97.9°F | Resp 17 | Ht 72.0 in | Wt 188.1 lb

## 2022-12-14 DIAGNOSIS — D696 Thrombocytopenia, unspecified: Secondary | ICD-10-CM | POA: Diagnosis not present

## 2022-12-14 DIAGNOSIS — Z8673 Personal history of transient ischemic attack (TIA), and cerebral infarction without residual deficits: Secondary | ICD-10-CM | POA: Diagnosis not present

## 2022-12-14 DIAGNOSIS — D693 Immune thrombocytopenic purpura: Secondary | ICD-10-CM | POA: Diagnosis not present

## 2022-12-14 LAB — CMP (CANCER CENTER ONLY)
ALT: 14 U/L (ref 0–44)
AST: 15 U/L (ref 15–41)
Albumin: 4.6 g/dL (ref 3.5–5.0)
Alkaline Phosphatase: 36 U/L — ABNORMAL LOW (ref 38–126)
Anion gap: 5 (ref 5–15)
BUN: 37 mg/dL — ABNORMAL HIGH (ref 8–23)
CO2: 27 mmol/L (ref 22–32)
Calcium: 9.6 mg/dL (ref 8.9–10.3)
Chloride: 107 mmol/L (ref 98–111)
Creatinine: 1.34 mg/dL — ABNORMAL HIGH (ref 0.61–1.24)
GFR, Estimated: 51 mL/min — ABNORMAL LOW (ref >60)
Glucose, Bld: 91 mg/dL (ref 70–99)
Potassium: 4.5 mmol/L (ref 3.5–5.1)
Sodium: 139 mmol/L (ref 135–145)
Total Bilirubin: 1 mg/dL (ref 0.3–1.2)
Total Protein: 6.7 g/dL (ref 6.5–8.1)

## 2022-12-14 LAB — CBC WITH DIFFERENTIAL (CANCER CENTER ONLY)
Abs Immature Granulocytes: 0.14 10*3/uL — ABNORMAL HIGH (ref 0.00–0.07)
Basophils Absolute: 0 10*3/uL (ref 0.0–0.1)
Basophils Relative: 0 %
Eosinophils Absolute: 0.1 10*3/uL (ref 0.0–0.5)
Eosinophils Relative: 1 %
HCT: 37.9 % — ABNORMAL LOW (ref 39.0–52.0)
Hemoglobin: 12.6 g/dL — ABNORMAL LOW (ref 13.0–17.0)
Immature Granulocytes: 2 %
Lymphocytes Relative: 41 %
Lymphs Abs: 3.2 10*3/uL (ref 0.7–4.0)
MCH: 31.9 pg (ref 26.0–34.0)
MCHC: 33.2 g/dL (ref 30.0–36.0)
MCV: 95.9 fL (ref 80.0–100.0)
Monocytes Absolute: 0.7 10*3/uL (ref 0.1–1.0)
Monocytes Relative: 9 %
Neutro Abs: 3.6 10*3/uL (ref 1.7–7.7)
Neutrophils Relative %: 47 %
Platelet Count: 107 10*3/uL — ABNORMAL LOW (ref 150–400)
RBC: 3.95 MIL/uL — ABNORMAL LOW (ref 4.22–5.81)
RDW: 13.2 % (ref 11.5–15.5)
WBC Count: 7.9 10*3/uL (ref 4.0–10.5)
nRBC: 0 % (ref 0.0–0.2)

## 2022-12-14 LAB — SAVE SMEAR(SSMR), FOR PROVIDER SLIDE REVIEW

## 2022-12-14 NOTE — Progress Notes (Signed)
Hematology and Oncology Follow Up Visit  Chad Norris [id number] Jan 21, 1932 89 y.o. 12/14/2022   Principle Diagnosis:  Chronic ITP  Current Therapy:   Observation   Interim History:  Chad Norris is here today to re-establish care for thrombocytopenia. He is doing quite well but has had some issues with balance.  He has had 2 falls since we last saw him bust states that he was not seriously injured.  He got overheated and dehydrated at a recent golf tournament and had to be cooled down in the emergency field tent. Thankfully this resolved and he has not had any other issues.  He stopped his aspirin a week ago due to bruising on his arms and left rib cage. These bruises are healing nicely. No hematomas noted. No redness or edema.  He does have history of stroke with left leg weakness.  He has occasional blurry vision lasting only a few minutes.  No new falls, no syncope.  No swelling, tenderness, numbness or tingling in his extremities. No fever, chills, n/v, cough, rash, SOB, chest pain, palpitations, abdominal pain or changes in bowel or bladder habits.   Appetite is good but he admits that he needs to better hydrate throughout the day.  He is hoping to restart playing golf.   ECOG Performance Status: 1 - Symptomatic but completely ambulatory  Medications:  Allergies as of 12/14/2022   No Known Allergies      Medication List        Accurate as of Dec 14, 2022  9:25 AM. If you have any questions, ask your nurse or doctor.          acetaminophen 325 MG tablet Commonly known as: Tylenol Take 2 tablets (650 mg total) by mouth every 6 (six) hours as needed for moderate pain.   Adult One Daily Gummies Chew Chew 2 tablets by mouth daily. men   atorvastatin 40 MG tablet Commonly known as: LIPITOR Take 1 tablet (40 mg total) by mouth daily.   triamcinolone cream 0.1 % Commonly known as: KENALOG APPLY TOPICALLY TO THE AFFECTED AREA TWICE DAILY        Allergies: No  Known Allergies  Past Medical History, Surgical history, Social history, and Family History were reviewed and updated.  Review of Systems: All other 10 point review of systems is negative.   Physical Exam:  height is 6' (1.829 m) and weight is 188 lb 1.9 oz (85.3 kg).   Wt Readings from Last 3 Encounters:  12/14/22 188 lb 1.9 oz (85.3 kg)  12/11/22 188 lb (85.3 kg)  12/10/22 189 lb (85.7 kg)    Ocular: Sclerae unicteric, pupils equal, round and reactive to light Ear-nose-throat: Oropharynx clear, dentition fair Lymphatic: No cervical or supraclavicular adenopathy Lungs no rales or rhonchi, good excursion bilaterally Heart regular rate and rhythm, no murmur appreciated Abd soft, nontender, positive bowel sounds MSK no focal spinal tenderness, no joint edema Neuro: non-focal, well-oriented, appropriate affect Breasts: Deferred   Lab Results  Component Value Date   WBC 7.9 12/14/2022   HGB 12.6 (L) 12/14/2022   HCT 37.9 (L) 12/14/2022   MCV 95.9 12/14/2022   PLT 107 (L) 12/14/2022   Lab Results  Component Value Date   FERRITIN 307 07/06/2021   IRON 93 07/06/2021   TIBC 262 07/06/2021   IRONPCTSAT 35 07/06/2021   Lab Results  Component Value Date   RBC 3.95 (L) 12/14/2022   No results found for: "KPAFRELGTCHN", "LAMBDASER", "KAPLAMBRATIO" No results found for: "IGGSERUM", "IGA", "  IGMSERUM" No results found for: "TOTALPROTELP", "ALBUMINELP", "A1GS", "A2GS", "BETS", "BETA2SER", "GAMS", "MSPIKE", "SPEI"   Chemistry      Component Value Date/Time   NA 139 12/14/2022 0847   NA 139 03/01/2021 1031   K 4.5 12/14/2022 0847   CL 107 12/14/2022 0847   CO2 27 12/14/2022 0847   BUN 37 (H) 12/14/2022 0847   BUN 20 03/01/2021 1031   CREATININE 1.34 (H) 12/14/2022 0847      Component Value Date/Time   CALCIUM 9.6 12/14/2022 0847   ALKPHOS 36 (L) 12/14/2022 0847   AST 15 12/14/2022 0847   ALT 14 12/14/2022 0847   BILITOT 1.0 12/14/2022 0847       Impression and Plan:   Chad Norris is a very pleasant 89 yo caucasian gentleman with thrombocytopenia first noted in September 2022.  His counts have remained stable.  His blood smear was reviewed by Dr. Myna Hidalgo and he does have some large platelets indicating chronic ITP.  No other abnormality or evidence of malignancy noted.  With his history of stroke we do feel that he should resume his baby aspirin daily with food.  Follow-up with MD in 6 months.    Eileen Stanford, NP 5/24/20249:25 AM

## 2023-01-09 ENCOUNTER — Emergency Department (HOSPITAL_COMMUNITY): Payer: Medicare Other

## 2023-01-09 ENCOUNTER — Emergency Department (HOSPITAL_COMMUNITY)
Admission: EM | Admit: 2023-01-09 | Discharge: 2023-01-09 | Disposition: A | Discharge status: Home / Self Care | Payer: Medicare Other | Attending: Emergency Medicine | Admitting: Emergency Medicine

## 2023-01-09 DIAGNOSIS — Z8546 Personal history of malignant neoplasm of prostate: Secondary | ICD-10-CM | POA: Diagnosis not present

## 2023-01-09 DIAGNOSIS — I480 Paroxysmal atrial fibrillation: Secondary | ICD-10-CM | POA: Diagnosis not present

## 2023-01-09 DIAGNOSIS — I6381 Other cerebral infarction due to occlusion or stenosis of small artery: Secondary | ICD-10-CM | POA: Diagnosis not present

## 2023-01-09 DIAGNOSIS — R531 Weakness: Secondary | ICD-10-CM | POA: Diagnosis not present

## 2023-01-09 DIAGNOSIS — Z8616 Personal history of COVID-19: Secondary | ICD-10-CM | POA: Insufficient documentation

## 2023-01-09 DIAGNOSIS — D696 Thrombocytopenia, unspecified: Secondary | ICD-10-CM | POA: Insufficient documentation

## 2023-01-09 DIAGNOSIS — I6789 Other cerebrovascular disease: Secondary | ICD-10-CM | POA: Diagnosis not present

## 2023-01-09 DIAGNOSIS — M6281 Muscle weakness (generalized): Secondary | ICD-10-CM | POA: Diagnosis present

## 2023-01-09 DIAGNOSIS — Z8249 Family history of ischemic heart disease and other diseases of the circulatory system: Secondary | ICD-10-CM | POA: Diagnosis not present

## 2023-01-09 DIAGNOSIS — I10 Essential (primary) hypertension: Secondary | ICD-10-CM | POA: Insufficient documentation

## 2023-01-09 DIAGNOSIS — R519 Headache, unspecified: Secondary | ICD-10-CM | POA: Diagnosis not present

## 2023-01-09 LAB — CBC WITH DIFFERENTIAL/PLATELET
Abs Immature Granulocytes: 0.07 10*3/uL (ref 0.00–0.07)
Basophils Absolute: 0 10*3/uL (ref 0.0–0.1)
Basophils Relative: 1 %
Eosinophils Absolute: 0.1 10*3/uL (ref 0.0–0.5)
Eosinophils Relative: 1 %
HCT: 40.5 % (ref 39.0–52.0)
Hemoglobin: 13.7 g/dL (ref 13.0–17.0)
Immature Granulocytes: 1 %
Lymphocytes Relative: 32 %
Lymphs Abs: 2.1 10*3/uL (ref 0.7–4.0)
MCH: 33 pg (ref 26.0–34.0)
MCHC: 33.8 g/dL (ref 30.0–36.0)
MCV: 97.6 fL (ref 80.0–100.0)
Monocytes Absolute: 0.9 10*3/uL (ref 0.1–1.0)
Monocytes Relative: 14 %
Neutro Abs: 3.4 10*3/uL (ref 1.7–7.7)
Neutrophils Relative %: 51 %
Platelets: 100 10*3/uL — ABNORMAL LOW (ref 150–400)
RBC: 4.15 MIL/uL — ABNORMAL LOW (ref 4.22–5.81)
RDW: 13.5 % (ref 11.5–15.5)
WBC: 6.5 10*3/uL (ref 4.0–10.5)
nRBC: 0 % (ref 0.0–0.2)

## 2023-01-09 LAB — COMPREHENSIVE METABOLIC PANEL
ALT: 15 U/L (ref 0–44)
AST: 23 U/L (ref 15–41)
Albumin: 4 g/dL (ref 3.5–5.0)
Alkaline Phosphatase: 42 U/L (ref 38–126)
Anion gap: 14 (ref 5–15)
BUN: 18 mg/dL (ref 8–23)
CO2: 22 mmol/L (ref 22–32)
Calcium: 9.5 mg/dL (ref 8.9–10.3)
Chloride: 101 mmol/L (ref 98–111)
Creatinine, Ser: 1.16 mg/dL (ref 0.61–1.24)
GFR, Estimated: >60 mL/min (ref >60)
Glucose, Bld: 108 mg/dL — ABNORMAL HIGH (ref 70–99)
Potassium: 4 mmol/L (ref 3.5–5.1)
Sodium: 137 mmol/L (ref 135–145)
Total Bilirubin: 0.9 mg/dL (ref 0.3–1.2)
Total Protein: 6.8 g/dL (ref 6.5–8.1)

## 2023-01-09 LAB — URINALYSIS, ROUTINE W REFLEX MICROSCOPIC
Bilirubin Urine: NEGATIVE
Glucose, UA: NEGATIVE mg/dL
Hgb urine dipstick: NEGATIVE
Ketones, ur: NEGATIVE mg/dL
Leukocytes,Ua: NEGATIVE
Nitrite: NEGATIVE
Protein, ur: NEGATIVE mg/dL
Specific Gravity, Urine: 1.008 (ref 1.005–1.030)
pH: 6 (ref 5.0–8.0)

## 2023-01-09 LAB — MAGNESIUM: Magnesium: 2.1 mg/dL (ref 1.7–2.4)

## 2023-01-09 LAB — PROTIME-INR
INR: 1 (ref 0.8–1.2)
Prothrombin Time: 13.6 seconds (ref 11.4–15.2)

## 2023-01-09 NOTE — ED Notes (Signed)
Got patint into a gown on the monitor did EKG shown Dr  Posey Rea got patient warm blanket patient is resting with call bell in reach

## 2023-01-09 NOTE — ED Triage Notes (Addendum)
Pt coming from home with all over weakness and htn. Pt states this all started around 1800 last night. . Pt denies cp/shob. Does have a history of irregular heart beat.

## 2023-01-09 NOTE — Discharge Instructions (Addendum)
We evaluated you for your weakness and irregular heart rate.  Your EKG showed that you were in an abnormal heart rhythm called atrial fibrillation.  Your heart went back into a normal rhythm without needing any additional treatment.  Please follow-up with a cardiologist.  I placed a cardiology referral.  They should call you within the next 48-72 hours.  The biggest risk of this abnormal heart rhythm is stroke.  If you develop any stroke symptoms such as numbness or tingling, weakness, facial droop, trouble speaking, trouble swallowing, vision changes, dizziness, or any other new symptoms, please return to the emergency department.  We discussed starting a blood thinner but you preferred to follow-up with a cardiologist before making this decision.  If you have any recurrent symptoms, such as palpitations, chest pain, recurrent weakness, fainting, difficulty breathing, or any other new symptoms, please return to the emergency department for reassessment.

## 2023-01-09 NOTE — ED Provider Notes (Signed)
[facility]ERG[facility] DEPARTMENT AT [facility] Provider Note  CSN: [id number] Arrival date & time: 01/09/23 4098  Chief Complaint(s) Weakness (Pt coming from home with HTN and all over weakness. Pt has hx of irregular heart beat)  HPI Chad Norris is a 89 y.o. male with history of prior stroke, hyperlipidemia, presenting to the emergency department with generalized weakness.  Patient reports he woke up this morning had generalized weakness.  He denies any focal weakness.  He also noticed that he had a fast and irregular heartbeat.  He reports that he did not have any of this last night.  He otherwise has no focal symptoms.  He denies any chest pain, shortness of breath, fevers or chills, abdominal pain, nausea or vomiting, diarrhea, dysuria, leg swelling, or any other new symptoms.  Reports that yesterday was feeling at baseline.  He reports he had a very mild headache earlier this morning which is resolved.   Past Medical History Past Medical History:  Diagnosis Date   Colon polyps    COVID-19    Diverticulosis    Gout    Hemorrhoids    Hyperlipemia    Nephrolithiasis    Prostate cancer (HCC)    Stroke [facility])    Patient Active Problem List   Diagnosis Date Noted   Primary osteoarthritis of left hip 10/31/2022   Trochanteric bursitis, left hip 10/31/2022   Insect bite of left lower leg 10/29/2022   Tick bite of left lower leg 10/29/2022   Pain of left lower extremity 10/29/2022   Cellulitis of left lower leg 10/29/2022   Urine frequency 06/06/2022   Thrombocytopenia (HCC) 06/06/2021   Overactive bladder 06/05/2021   Skin lesions 01/25/2021   Nephrolithiasis    COVID-19    Pneumonia 08/24/2020   Cervical radiculopathy 06/11/2019   Sensorineural hearing loss, bilateral 05/21/2019   History of CVA (cerebrovascular accident) 08/18/2018   Hyperlipidemia 03/27/2018   Basal cell carcinoma (BCC) of [place] 03/11/2017   Prediabetes 03/11/2017   DDD (degenerative  disc disease), lumbosacral 01/13/2014   PROSTATE CANCER, HX OF 06/21/2008   COLONIC POLYPS, HX OF 06/21/2008   NEPHROLITHIASIS, HX OF 05/07/2008   History of gout 10/09/2007   Home Medication(s) Prior to Admission medications   Medication Sig Start Date End Date Taking? Authorizing Provider  acetaminophen (TYLENOL) 325 MG tablet Take 2 tablets (650 mg total) by mouth every 6 (six) hours as needed for moderate pain. 06/21/22   Wallis Bamberg, PA-C  atorvastatin (LIPITOR) 40 MG tablet Take 1 tablet (40 mg total) by mouth daily. 09/17/22   Pincus Sanes, MD  Multiple Vitamins-Minerals (ADULT ONE DAILY GUMMIES) CHEW Chew 2 tablets by mouth daily. men    [provider]  triamcinolone cream (KENALOG) 0.1 % APPLY TOPICALLY TO THE AFFECTED AREA TWICE DAILY 12/04/22   Pincus Sanes, MD  Past Surgical History Past Surgical History:  Procedure Laterality Date   CATARACT EXTRACTION W/ INTRAOCULAR LENS  IMPLANT, BILATERAL     bi-lat   CERVICAL SPINE SURGERY     COLONOSCOPY W/ POLYPECTOMY     Epidural steroid injections     3-4, Dr Danielle Dess   RETROPUBIC PROSTATECTOMY     SHOULDER SURGERY     left   Family History Family History  Problem Relation Age of Onset   Kidney cancer Father    Colon cancer Father    Cancer Brother        ?   Heart attack Brother 55   Heart attack Sister 20   Breast cancer Sister    Diabetes Brother    Alcohol abuse Brother    Colon polyps Brother     Social History Social History   Tobacco Use   Smoking status: Never   Smokeless tobacco: Never  Vaping Use   Vaping Use: Never used  Substance Use Topics   Alcohol use: Not Currently    Alcohol/week: 1.0 standard drink of alcohol    Types: 1 Cans of beer per week   Drug use: No   Allergies Patient has no known allergies.  Review of Systems Review of Systems  All other  systems reviewed and are negative.   Physical Exam Vital Signs  I have reviewed the triage vital signs BP 104/66   Pulse (!) 56   Temp 97.8 F (36.6 C) (Oral)   Resp (!) 7   Ht 6' (1.829 m)   Wt 85.7 kg   SpO2 98%   BMI 25.63 kg/m  Physical Exam Vitals and nursing note reviewed.  Constitutional:      General: He is not in acute distress.    Appearance: Normal appearance.  HENT:     Mouth/Throat:     Mouth: Mucous membranes are moist.  Eyes:     Conjunctiva/sclera: Conjunctivae normal.  Cardiovascular:     Rate and Rhythm: Tachycardia present. Rhythm irregular.  Pulmonary:     Effort: Pulmonary effort is normal. No respiratory distress.     Breath sounds: Normal breath sounds.  Abdominal:     General: Abdomen is flat.     Palpations: Abdomen is soft.     Tenderness: There is no abdominal tenderness.  Musculoskeletal:     Right lower leg: No edema.     Left lower leg: No edema.  Skin:    General: Skin is warm and dry.     Capillary Refill: Capillary refill takes less than 2 seconds.  Neurological:     Mental Status: He is alert and oriented to person, place, and time. Mental status is at baseline.     Comments: Cranial nerves II through XII intact, strength 5 out of 5 in the bilateral upper and lower extremities, no sensory deficit to light touch, no dysmetria on finger-nose-finger testing  Psychiatric:        Mood and Affect: Mood normal.        Behavior: Behavior normal.     ED Results and Treatments Labs (all labs ordered are listed, but only abnormal results are displayed) Labs Reviewed  COMPREHENSIVE METABOLIC PANEL - Abnormal; Notable for the following components:      Result Value   Glucose, Bld 108 (*)    All other components within normal limits  CBC WITH DIFFERENTIAL/PLATELET - Abnormal; Notable for the following components:   RBC 4.15 (*)    Platelets 100 (*)  All other components within normal limits  PROTIME-INR  MAGNESIUM  URINALYSIS,  ROUTINE W REFLEX MICROSCOPIC                                                                                                                          Radiology CT Head Wo Contrast  Result Date: 01/09/2023 CLINICAL DATA:  Headache, fever. EXAM: CT HEAD WITHOUT CONTRAST TECHNIQUE: Contiguous axial images were obtained from the base of the skull through the vertex without intravenous contrast. RADIATION DOSE REDUCTION: This exam was performed according to the departmental dose-optimization program which includes automated exposure control, adjustment of the mA and/or kV according to patient size and/or use of iterative reconstruction technique. COMPARISON:  Head CT 03/25/2020. FINDINGS: Brain: No acute hemorrhage. Unchanged mild chronic small-vessel disease with old lacunar infarct in the left corona radiata. Cortical gray-white differentiation is otherwise preserved. Prominence of the ventricles and sulci within expected range for age. No hydrocephalus or extra-axial collection. No mass effect or midline shift. Vascular: No hyperdense vessel or unexpected calcification. Skull: No calvarial fracture or suspicious bone lesion. Skull base is unremarkable. Sinuses/Orbits: Unremarkable. Other: None. IMPRESSION: 1. No acute intracranial abnormality. 2. Unchanged mild chronic small-vessel disease. Electronically Signed   By: Orvan Falconer M.D.   On: 01/09/2023 10:26   DG Chest Port 1 View  Result Date: 01/09/2023 CLINICAL DATA:  Weakness. EXAM: PORTABLE CHEST 1 VIEW COMPARISON:  06/21/2022. FINDINGS: Clear lungs. Stable cardiac and mediastinal contours. No pleural effusion or pneumothorax. Visualized bones and upper abdomen are unremarkable. IMPRESSION: No evidence of acute cardiopulmonary disease. Electronically Signed   By: Orvan Falconer M.D.   On: 01/09/2023 09:58    Pertinent labs & imaging results that were available during my care of the patient were reviewed by me and considered in my medical decision  making (see MDM for details).  Medications Ordered in ED Medications - No data to display                                                                                                                                   Procedures Procedures  (including critical care time)  Medical Decision Making / ED Course   MDM:  89 year old male presenting to the emergency department with generalized weakness.  Patient overall well-appearing, vitals with mild tachycardia.  EKG shows atrial fibrillation.  Physical exam including neurologic exam is reassuring.  Given lack  of other symptoms, suspect most likely cause of weakness is atrial fibrillation.  Patient denies any focal weakness and has no neurologic deficit to suggest acute stroke.  Patient does report a mild headache earlier which is resolved, will check head CT but low concern for acute intracranial process.  Will check labs to evaluate for electrolyte abnormality, anemia has other causes of weakness which may precipitate atrial fibrillation.  No infectious symptoms to suggest underlying infectious process will check urinalysis, chest x-ray.  No chest pain, shortness of breath to suggest other process such as ACS or pulmonary embolism.  Will reassess.  If remainder of workup without other causes of weakness, anticipate trial of cardioversion given symptoms began this morning.  Clinical Course as of 01/09/23 1132  Wed Jan 09, 2023  1000 Patient self converted to sinus rhythm. [WS]  1116 Workup overall reassuring.  Patient reports that his symptoms resolved after he returned to normal sinus rhythm.  Discussed anticoagulation with the patient, his CHA2DS2-VASc score would be at least 4 so anticoagulation will be indicated, patient is also 89 so there is some increased risk of bleeding from falls or GI bleeding.  Discussed with the patient and wife, he would prefer to follow-up with the cardiologist before making a decision on anticoagulation and  understands the risk of stroke. Wife is in agreement. Will discharge patient to home. All questions answered. Patient comfortable with plan of discharge. Return precautions discussed with patient and specified on the after visit summary.  [WS]    Clinical Course User Index [WS] Lonell Grandchild, MD     Additional history obtained: -Additional history obtained from spouse -External records from outside source obtained and reviewed including: Chart review including previous notes, labs, imaging, consultation notes including prior oncology notes    Lab Tests: -I ordered, reviewed, and interpreted labs.   The pertinent results include:   Labs Reviewed  COMPREHENSIVE METABOLIC PANEL - Abnormal; Notable for the following components:      Result Value   Glucose, Bld 108 (*)    All other components within normal limits  CBC WITH DIFFERENTIAL/PLATELET - Abnormal; Notable for the following components:   RBC 4.15 (*)    Platelets 100 (*)    All other components within normal limits  PROTIME-INR  MAGNESIUM  URINALYSIS, ROUTINE W REFLEX MICROSCOPIC    Notable for borderline hypoglycemia. Mild thrombocytopenia  EKG   EKG Interpretation  Date/Time:  Wednesday January 09 2023 09:48:47 EDT Ventricular Rate:  57 PR Interval:  194 QRS Duration: 143 QT Interval:  448 QTC Calculation: 437 R Axis:   -69 Text Interpretation: Sinus rhythm Left bundle branch block Confirmed by Alvino Blood ([id number]) on 01/09/2023 9:53:03 AM         Imaging Studies ordered: I ordered imaging studies including CXR, CT head On my interpretation imaging demonstrates no acute process I independently visualized and interpreted imaging. I agree with the radiologist interpretation   Medicines ordered and prescription drug management: No orders of the defined types were placed in this encounter.   -I have reviewed the patients home medicines and have made adjustments as needed    Cardiac  Monitoring: The patient was maintained on a cardiac monitor.  I personally viewed and interpreted the cardiac monitored which showed an underlying rhythm of: afib, conversion to sinus bradycardia   Reevaluation: After the interventions noted above, I reevaluated the patient and found that their symptoms have resolved  Co morbidities that complicate the patient evaluation  Past Medical  History:  Diagnosis Date   Colon polyps    COVID-19    Diverticulosis    Gout    Hemorrhoids    Hyperlipemia    Nephrolithiasis    Prostate cancer (HCC)    Stroke [facility])       Dispostion: Disposition decision including need for hospitalization was considered, and patient discharged from emergency department.    Final Clinical Impression(s) / ED Diagnoses Final diagnoses:  Paroxysmal A-fib (HCC)     This chart was dictated using voice recognition software.  Despite best efforts to proofread,  errors can occur which can change the documentation meaning.    Lonell Grandchild, MD 01/09/23 [phone]

## 2023-01-14 NOTE — Progress Notes (Unsigned)
Cardiology Office Note:    Date:  01/15/2023   ID:  Chad Norris, DOB [birth year], MRN [id number]  PCP:  Chad Sanes, MD   [facility] Providers Cardiologist:  None     Referring MD: Chad Sanes, MD   No chief complaint on file. PAF  History of Present Illness:    Chad Norris is a 89 y.o. male with a hx of stroke 2000 referral for paroxysmal afib. He was seen in the ED for generalized weakness . EKG shows afib with LBBB. TTE 08/19/2018  EF 50-55%, nl RV fxn. Normal left atrium. No significant valve dx.  He was offered [facility], but he wanted to discuss with cardiology first.  He notes hx of thrombocytopenia, unknown etiology. Otherwise, he has no hx of cardiac dx.  Past Medical History:  Diagnosis Date   Colon polyps    COVID-19    Diverticulosis    Gout    Hemorrhoids    Hyperlipemia    Nephrolithiasis    Prostate cancer (HCC)    Stroke [facility])     Past Surgical History:  Procedure Laterality Date   CATARACT EXTRACTION W/ INTRAOCULAR LENS  IMPLANT, BILATERAL     bi-lat   CERVICAL SPINE SURGERY     COLONOSCOPY W/ POLYPECTOMY     Epidural steroid injections     3-4, Dr Danielle Dess   RETROPUBIC PROSTATECTOMY     SHOULDER SURGERY     left    Current Medications: Current Outpatient Medications on File Prior to Visit  Medication Sig Dispense Refill   acetaminophen (TYLENOL) 325 MG tablet Take 2 tablets (650 mg total) by mouth every 6 (six) hours as needed for moderate pain. 30 tablet 0   atorvastatin (LIPITOR) 40 MG tablet Take 1 tablet (40 mg total) by mouth daily. 90 tablet 0   Multiple Vitamins-Minerals (ADULT ONE DAILY GUMMIES) CHEW Chew 2 tablets by mouth daily. men     triamcinolone cream (KENALOG) 0.1 % APPLY TOPICALLY TO THE AFFECTED AREA TWICE DAILY 30 g 0   No current facility-administered medications on file prior to visit.     Allergies:   Patient has no known allergies.   Social History   Socioeconomic History   Marital status:  Married    Spouse name: Not on file   Number of children: 2   Years of education: Not on file   Highest education level: 12th grade  Occupational History   Occupation: retired  Tobacco Use   Smoking status: Never   Smokeless tobacco: Never  Vaping Use   Vaping Use: Never used  Substance and Sexual Activity   Alcohol use: Not Currently    Alcohol/week: 1.0 standard drink of alcohol    Types: 1 Cans of beer per week   Drug use: No   Sexual activity: Not Currently  Other Topics Concern   Not on file  Social History Narrative   Not on file   Social Determinants of Health   Financial Resource Strain: Low Risk  (10/26/2022)   Overall Financial Resource Strain (CARDIA)    Difficulty of Paying Living Expenses: Not very hard  Food Insecurity: No Food Insecurity (10/26/2022)   Hunger Vital Sign    Worried About Running Out of Food in the Last Year: Never true    Ran Out of Food in the Last Year: Never true  Transportation Needs: No Transportation Needs (10/26/2022)   PRAPARE - Administrator, Civil Service (Medical): No  Lack of Transportation (Non-Medical): No  Physical Activity: Insufficiently Active (10/26/2022)   Exercise Vital Sign    Days of Exercise per Week: 1 day    Minutes of Exercise per Session: 10 min  Stress: No Stress Concern Present (10/26/2022)   [employer] of Occupational Health - Occupational Stress Questionnaire    Feeling of Stress : Only a little  Social Connections: Socially Integrated (10/26/2022)   Social Connection and Isolation Panel [NHANES]    Frequency of Communication with Friends and Family: More than three times a week    Frequency of Social Gatherings with Friends and Family: More than three times a week    Attends Religious Services: 1 to 4 times per year    Active Member of [employer] or Organizations: Yes    Attends Engineer, structural: 1 to 4 times per year    Marital Status: Married     Family History: The patient's  family history includes Alcohol abuse in his brother; Breast cancer in his sister; Cancer in his brother; Colon cancer in his father; Colon polyps in his brother; Diabetes in his brother; Heart attack (age of onset: 31) in his brother; Heart attack (age of onset: 68) in his sister; Kidney cancer in his father.  ROS:   Please see the history of present illness.     All other systems reviewed and are negative.  EKGs/Labs/Other Studies Reviewed:    The following studies were reviewed today:  EKG Interpretation  Date/Time:  Tuesday January 15 2023 09:05:20 EDT Ventricular Rate:  59 PR Interval:  192 QRS Duration: 146 QT Interval:  458 QTC Calculation: 453 R Axis:   -10 Text Interpretation: Sinus bradycardia Left bundle Brent Noto block When compared with ECG of 09-Jan-2023 09:48, PREVIOUS ECG IS PRESENT Confirmed by Carolan Clines ([id number]) on 01/15/2023 9:11:26 AM    EKG: EKG Interpretation  Date/Time:  Tuesday January 15 2023 09:05:20 EDT Ventricular Rate:  59 PR Interval:  192 QRS Duration: 146 QT Interval:  458 QTC Calculation: 453 R Axis:   -10 Text Interpretation: Sinus bradycardia Left bundle Lilley Hubble block When compared with ECG of 09-Jan-2023 09:48, PREVIOUS ECG IS PRESENT Confirmed by Carolan Clines ([id number]) on 01/15/2023 9:11:26 AM   Recent Labs: 01/09/2023: ALT 15; BUN 18; Creatinine, Ser 1.16; Hemoglobin 13.7; Magnesium 2.1; Platelets 100; Potassium 4.0; Sodium 137   Recent Lipid Panel    Component Value Date/Time   CHOL 195 06/06/2022 1025   CHOL 127 04/09/2019 1540   CHOL 216 (H) 01/13/2014 1037   TRIG 170.0 (H) 06/06/2022 1025   TRIG 245 (H) 01/13/2014 1037   HDL 34.90 (L) 06/06/2022 1025   HDL 41 04/09/2019 1540   HDL 41 01/13/2014 1037   CHOLHDL 6 06/06/2022 1025   VLDL 34.0 06/06/2022 1025   LDLCALC 126 (H) 06/06/2022 1025   LDLCALC 65 04/09/2019 1540   LDLCALC 126 (H) 01/13/2014 1037   LDLDIRECT 122.0 03/27/2018 0852     Risk Assessment/Calculations:    CHA2DS2-VASc  Score = 4   This indicates a 4.8% annual risk of stroke. The patient's score is based upon: CHF History: 0 HTN History: 0 Diabetes History: 0 Stroke History: 2 Vascular Disease History: 0 Age Score: 2 Gender Score: 0           Physical Exam:    VS:  BP 120/66   Pulse (!) 59   Ht 6' (1.829 m)   Wt 189 lb (85.7 kg)   SpO2 99%   BMI 25.63  kg/m     Wt Readings from Last 3 Encounters:  01/15/23 189 lb (85.7 kg)  01/09/23 189 lb (85.7 kg)  12/14/22 188 lb 1.9 oz (85.3 kg)     GEN:  Well nourished, well developed in no acute distress HEENT: Normal NECK: No JVD; No carotid bruits CARDIAC: RRR, no murmurs, rubs, gallops RESPIRATORY:  Clear to auscultation without rales, wheezing or rhonchi  ABDOMEN: Soft, non-tender, non-distended MUSCULOSKELETAL:  No edema; No deformity  SKIN: Warm and dry NEUROLOGIC:  Alert and oriented x 3 PSYCHIATRIC:  Normal affect   ASSESSMENT:    Paroxysmal Atrial Fibrillation - start eliquis 5 mg BID; will monitor platelets  - diltiazem 30 mg q6H PRN - prior TTE EF 50-55%   HLD - continue lipitor 40 mg daily PLAN:    In order of problems listed above:  Stop aspirin 81 mg daily Start eliquis 5 mg BID Diltiazem 30 mg q6H PRN Follow up 6 months  Medication Adjustments/Labs and Tests Ordered: Current medicines are reviewed at length with the patient today.  Concerns regarding medicines are outlined above.  Orders Placed This Encounter  Procedures   EKG 12-Lead   Meds ordered this encounter  Medications   diltiazem (CARDIZEM) 30 MG tablet    Sig: Take 1 tablet (30 mg total) by mouth every 6 (six) hours as needed (for heart rate greater than 100 bpm.).    Dispense:  60 tablet    Refill:  3   apixaban (ELIQUIS) 5 MG TABS tablet    Sig: Take 1 tablet (5 mg total) by mouth 2 (two) times daily.    Dispense:  180 tablet    Refill:  3    Patient Instructions  Medication Instructions:  Your physician has recommended you make the  following change in your medication:   -Start abixaban (Eliquis) 5mg  twice daily.  -Start diltiazem (cardizem) 30mg  every 6 hours as needed for heart rate greater than 100 bpm.  -Stop aspirin.  *If you need a refill on your cardiac medications before your next appointment, please call your pharmacy*    Follow-Up: At [facility], you and your health needs are our priority.  As part of our continuing mission to provide you with exceptional heart care, we have created designated Provider Care Teams.  These Care Teams include your primary Cardiologist (physician) and Advanced Practice Providers (APPs -  Physician Assistants and Nurse Practitioners) who all work together to provide you with the care you need, when you need it.  We recommend signing up for the patient portal called "MyChart".  Sign up information is provided on this After Visit Summary.  MyChart is used to connect with patients for Virtual Visits (Telemedicine).  Patients are able to view lab/test results, encounter notes, upcoming appointments, etc.  Non-urgent messages can be sent to your provider as well.   To learn more about what you can do with MyChart, go to [email or web address].    Your next appointment:   6 month(s)  Provider:   Dr. Carolan Clines    Signed, Maisie Fus, MD  01/15/2023 2:22 PM    [facility]

## 2023-01-15 ENCOUNTER — Ambulatory Visit: Payer: Medicare Other | Attending: Internal Medicine | Admitting: Internal Medicine

## 2023-01-15 ENCOUNTER — Encounter: Payer: Self-pay | Admitting: Internal Medicine

## 2023-01-15 VITALS — BP 120/66 | HR 59 | Ht 72.0 in | Wt 189.0 lb

## 2023-01-15 DIAGNOSIS — I480 Paroxysmal atrial fibrillation: Secondary | ICD-10-CM | POA: Diagnosis not present

## 2023-01-15 MED ORDER — APIXABAN 5 MG PO TABS: 5.0000 mg | ORAL_TABLET | Freq: Two times a day (BID) | ORAL | 3 refills | Status: DC

## 2023-01-15 MED ORDER — DILTIAZEM HCL 30 MG PO TABS: 30.0000 mg | ORAL_TABLET | Freq: Four times a day (QID) | ORAL | 3 refills | Status: DC | PRN

## 2023-01-15 NOTE — Patient Instructions (Signed)
Medication Instructions:  Your physician has recommended you make the following change in your medication:   -Start abixaban (Eliquis) 5mg  twice daily.  -Start diltiazem (cardizem) 30mg  every 6 hours as needed for heart rate greater than 100 bpm.  -Stop aspirin.  *If you need a refill on your cardiac medications before your next appointment, please call your pharmacy*    Follow-Up: At [facility], you and your health needs are our priority.  As part of our continuing mission to provide you with exceptional heart care, we have created designated Provider Care Teams.  These Care Teams include your primary Cardiologist (physician) and Advanced Practice Providers (APPs -  Physician Assistants and Nurse Practitioners) who all work together to provide you with the care you need, when you need it.  We recommend signing up for the patient portal called "MyChart".  Sign up information is provided on this After Visit Summary.  MyChart is used to connect with patients for Virtual Visits (Telemedicine).  Patients are able to view lab/test results, encounter notes, upcoming appointments, etc.  Non-urgent messages can be sent to your provider as well.   To learn more about what you can do with MyChart, go to [email or web address].    Your next appointment:   6 month(s)  Provider:   Dr. Carolan Clines

## 2023-01-15 NOTE — Patient Instructions (Addendum)
       Medications changes include :   none       Return in about 9 months (around 10/16/2023) for Physical Exam.

## 2023-01-15 NOTE — Progress Notes (Unsigned)
Subjective:    Patient ID: Chad Norris, male    DOB: July 23, 1932, 89 y.o.   MRN: [id number]     HPI Chad Norris is here for follow up from the ED   6/19 - ED for generalized weakness.  He woke up that morning with generalized weakness.  There was no focal weakness.  He also noticed a fast, irregular heartbeat.  He had a mild headache that morning, which resolved.  He denied chest pain, shortness of breath, fevers, abdominal pain, nausea, leg swelling.  The day prior he was feeling good - at his baseline.  Work up in  ED showed Afib at 99 bpm and LBBB.  He self converted back to sinus.  Blood work, CXR,  UA and Ct head unremarkable.  He deferred anticoagulation until talking to cardiology.  He does have history of a stroke and thrombocytopenia   He did see cardiology-Dr. Wyline Mood yesterday.He was instructed to stop aspirin, start Eliquis 5 mg twice daily, start diltiazem 30 mg every 6 hours as needed and to follow-up with cardiology in 6 months.  Since emergency room he has not had any chest pain, palpitations or shortness of breath.  He has not had the weakness.  He has not had any lightheadedness, dizziness, headaches or leg swelling.  He has not needed to take the diltiazem  Medications and allergies reviewed with patient and updated if appropriate.  Current Outpatient Medications on File Prior to Visit  Medication Sig Dispense Refill   acetaminophen (TYLENOL) 325 MG tablet Take 2 tablets (650 mg total) by mouth every 6 (six) hours as needed for moderate pain. 30 tablet 0   apixaban (ELIQUIS) 5 MG TABS tablet Take 1 tablet (5 mg total) by mouth 2 (two) times daily. 180 tablet 3   aspirin EC 81 MG tablet Take 81 mg by mouth daily. Swallow whole.     atorvastatin (LIPITOR) 40 MG tablet Take 1 tablet (40 mg total) by mouth daily. 90 tablet 0   diltiazem (CARDIZEM) 30 MG tablet Take 1 tablet (30 mg total) by mouth every 6 (six) hours as needed (for heart rate greater than 100 bpm.).  60 tablet 3   Multiple Vitamins-Minerals (ADULT ONE DAILY GUMMIES) CHEW Chew 2 tablets by mouth daily. men     triamcinolone cream (KENALOG) 0.1 % APPLY TOPICALLY TO THE AFFECTED AREA TWICE DAILY 30 g 0   No current facility-administered medications on file prior to visit.     Review of Systems  Constitutional:  Negative for fatigue.  Respiratory:  Negative for shortness of breath.   Cardiovascular:  Negative for chest pain, palpitations and leg swelling.  Neurological:  Negative for light-headedness and headaches.       Objective:   Vitals:   01/16/23 1013  BP: 118/64  Pulse: 65  Temp: 98 F (36.7 C)  SpO2: 97%   BP Readings from Last 3 Encounters:  01/16/23 118/64  01/15/23 120/66  01/09/23 107/67   Wt Readings from Last 3 Encounters:  01/16/23 190 lb (86.2 kg)  01/15/23 189 lb (85.7 kg)  01/09/23 189 lb (85.7 kg)   Body mass index is 25.77 kg/m.    Physical Exam Constitutional:      General: He is not in acute distress.    Appearance: Normal appearance. He is not ill-appearing.  HENT:     Head: Normocephalic and atraumatic.  Eyes:     Conjunctiva/sclera: Conjunctivae normal.  Cardiovascular:     Rate and  Rhythm: Normal rate and regular rhythm.     Heart sounds: Normal heart sounds.  Pulmonary:     Effort: Pulmonary effort is normal. No respiratory distress.     Breath sounds: Normal breath sounds. No wheezing or rales.  Musculoskeletal:     Right lower leg: No edema.     Left lower leg: No edema.  Skin:    General: Skin is warm and dry.     Findings: No rash.  Neurological:     Mental Status: He is alert. Mental status is at baseline.  Psychiatric:        Mood and Affect: Mood normal.        Lab Results  Component Value Date   WBC 6.5 01/09/2023   HGB 13.7 01/09/2023   HCT 40.5 01/09/2023   PLT 100 (L) 01/09/2023   GLUCOSE 108 (H) 01/09/2023   CHOL 195 06/06/2022   TRIG 170.0 (H) 06/06/2022   HDL 34.90 (L) 06/06/2022   LDLDIRECT 122.0  03/27/2018   LDLCALC 126 (H) 06/06/2022   ALT 15 01/09/2023   AST 23 01/09/2023   NA 137 01/09/2023   K 4.0 01/09/2023   CL 101 01/09/2023   CREATININE 1.16 01/09/2023   BUN 18 01/09/2023   CO2 22 01/09/2023   TSH 1.81 06/05/2021   PSA 0.01 (L) 01/13/2014   INR 1.0 01/09/2023   HGBA1C 5.1 06/06/2022   [place] LIMITED JOINT SPACE STRUCTURES LOW LEFT(NO LINKED CHARGES) Procedure: Real-time Ultrasound Guided Injection of right hip femoral  acetabular joint anterior approach Device: Philips Affiniti 50G Images permanently stored and available for review in PACS Verbal informed consent obtained.  Discussed risks and benefits of  procedure. Warned about infection, bleeding, hyperglycemia damage to  structures among others. Patient expresses understanding and agreement Time-out conducted.   Noted no overlying erythema, induration, or other signs of local  infection.   Skin prepped in a sterile fashion.   Local anesthesia: Topical Ethyl chloride.   With sterile technique and under real time ultrasound guidance: 40 mg of  Kenalog and 2 ml Marcaine injected into hip joint. Fluid seen entering the  joint capsule.   Completed without difficulty   Pain immediately resolved suggesting accurate placement of the medication.    Advised to call if fevers/chills, erythema, induration, drainage, or  persistent bleeding.   Images permanently stored and available for review in the ultrasound unit.   Impression: Technically successful ultrasound guided injection.      Assessment & Plan:    See Problem List for Assessment and Plan of chronic medical problems.

## 2023-01-16 ENCOUNTER — Ambulatory Visit (INDEPENDENT_AMBULATORY_CARE_PROVIDER_SITE_OTHER): Payer: Medicare Other | Admitting: Internal Medicine

## 2023-01-16 VITALS — BP 118/64 | HR 65 | Temp 98.0°F | Ht 72.0 in | Wt 190.0 lb

## 2023-01-16 DIAGNOSIS — I480 Paroxysmal atrial fibrillation: Secondary | ICD-10-CM | POA: Diagnosis not present

## 2023-01-16 NOTE — Assessment & Plan Note (Signed)
New  Recent ED visit for weakness and irregular heartbeat confirmed atrial fibrillation He self converted prior to needing cardioversion No episodes since then [facility] cardiology yesterday On Eliquis 5 mg twice daily Will take diltiazem 30 mg every 6 hours as needed.  Reviewed when he should take this

## 2023-01-17 ENCOUNTER — Encounter: Payer: Self-pay | Admitting: Internal Medicine

## 2023-02-07 ENCOUNTER — Ambulatory Visit
Admission: EM | Admit: 2023-02-07 | Discharge: 2023-02-07 | Disposition: A | Discharge status: Home / Self Care | Payer: Medicare Other | Attending: Internal Medicine | Admitting: Internal Medicine

## 2023-02-07 ENCOUNTER — Ambulatory Visit (INDEPENDENT_AMBULATORY_CARE_PROVIDER_SITE_OTHER): Payer: Medicare Other

## 2023-02-07 DIAGNOSIS — I480 Paroxysmal atrial fibrillation: Secondary | ICD-10-CM | POA: Diagnosis not present

## 2023-02-07 DIAGNOSIS — R918 Other nonspecific abnormal finding of lung field: Secondary | ICD-10-CM | POA: Diagnosis not present

## 2023-02-07 DIAGNOSIS — R051 Acute cough: Secondary | ICD-10-CM

## 2023-02-07 DIAGNOSIS — Z8616 Personal history of COVID-19: Secondary | ICD-10-CM | POA: Insufficient documentation

## 2023-02-07 DIAGNOSIS — R531 Weakness: Secondary | ICD-10-CM

## 2023-02-07 DIAGNOSIS — Z8673 Personal history of transient ischemic attack (TIA), and cerebral infarction without residual deficits: Secondary | ICD-10-CM | POA: Diagnosis not present

## 2023-02-07 DIAGNOSIS — R059 Cough, unspecified: Secondary | ICD-10-CM | POA: Diagnosis not present

## 2023-02-07 DIAGNOSIS — Z8546 Personal history of malignant neoplasm of prostate: Secondary | ICD-10-CM | POA: Insufficient documentation

## 2023-02-07 DIAGNOSIS — E785 Hyperlipidemia, unspecified: Secondary | ICD-10-CM | POA: Diagnosis not present

## 2023-02-07 DIAGNOSIS — B349 Viral infection, unspecified: Secondary | ICD-10-CM | POA: Diagnosis not present

## 2023-02-07 DIAGNOSIS — Z981 Arthrodesis status: Secondary | ICD-10-CM | POA: Diagnosis not present

## 2023-02-07 DIAGNOSIS — Z1152 Encounter for screening for COVID-19: Secondary | ICD-10-CM | POA: Insufficient documentation

## 2023-02-07 DIAGNOSIS — R6883 Chills (without fever): Secondary | ICD-10-CM | POA: Diagnosis not present

## 2023-02-07 LAB — POCT URINALYSIS DIP (MANUAL ENTRY)
Glucose, UA: NEGATIVE mg/dL
Leukocytes, UA: NEGATIVE
Nitrite, UA: NEGATIVE
Protein Ur, POC: 100 mg/dL — AB
Spec Grav, UA: 1.02 (ref 1.010–1.025)
Urobilinogen, UA: 1 E.U./dL
pH, UA: 7.5 (ref 5.0–8.0)

## 2023-02-07 LAB — POCT INFLUENZA A/B
Influenza A, POC: NEGATIVE
Influenza B, POC: NEGATIVE

## 2023-02-07 NOTE — Discharge Instructions (Signed)
Your flu test was negative.  Your chest x-ray was negative for pneumonia.  Your urine does not show that you have a UTI.  We send out your COVID test and we will contact you tomorrow if that is positive.  As you are reporting extreme weakness stating that you are unable to walk due to this I do advise that you go to the emergency room for further evaluation of your symptoms.

## 2023-02-07 NOTE — ED Triage Notes (Signed)
Pt presents to UC w/ c/o weakeness, chest congestion x3 days.

## 2023-02-07 NOTE — ED Provider Notes (Addendum)
[facility]    CSN: [id number] Arrival date & time: 02/07/23  1536      History   Chief Complaint No chief complaint on file.   HPI Chad Norris is a 89 y.o. male  presents for evaluation of URI symptoms for 3 days.  Patient has a past medical history of CVA, prostate cancer, hyperlipidemia, A-fib. patient reports associated symptoms of cough, congestion, extreme weakness.  States he can barely walk he feels so weak.  Denies unilateral weakness.  Denies N/V/D, fevers, ear pain, shortness of breath, headache, sore throat. Patient does not have a hx of asthma or smoking. No known sick contacts.  Pt has taken nothing OTC for symptoms. Pt has no other concerns at this time.   HPI  Past Medical History:  Diagnosis Date   Colon polyps    COVID-19    Diverticulosis    Gout    Hemorrhoids    Hyperlipemia    Nephrolithiasis    Prostate cancer (HCC)    Stroke [facility])     Patient Active Problem List   Diagnosis Date Noted   PAF (paroxysmal atrial fibrillation) (HCC) 01/15/2023   Primary osteoarthritis of left hip 10/31/2022   Trochanteric bursitis, left hip 10/31/2022   Insect bite of left lower leg 10/29/2022   Tick bite of left lower leg 10/29/2022   Pain of left lower extremity 10/29/2022   Cellulitis of left lower leg 10/29/2022   Urine frequency 06/06/2022   Thrombocytopenia (HCC) 06/06/2021   Overactive bladder 06/05/2021   Skin lesions 01/25/2021   Nephrolithiasis    COVID-19    Cervical radiculopathy 06/11/2019   Sensorineural hearing loss, bilateral 05/21/2019   History of CVA (cerebrovascular accident) 08/18/2018   Hyperlipidemia 03/27/2018   Basal cell carcinoma (BCC) of [place] 03/11/2017   Prediabetes 03/11/2017   DDD (degenerative disc disease), lumbosacral 01/13/2014   PROSTATE CANCER, HX OF 06/21/2008   COLONIC POLYPS, HX OF 06/21/2008   NEPHROLITHIASIS, HX OF 05/07/2008   History of gout 10/09/2007    Past Surgical History:   Procedure Laterality Date   CATARACT EXTRACTION W/ INTRAOCULAR LENS  IMPLANT, BILATERAL     bi-lat   CERVICAL SPINE SURGERY     COLONOSCOPY W/ POLYPECTOMY     Epidural steroid injections     3-4, Dr Danielle Dess   RETROPUBIC PROSTATECTOMY     SHOULDER SURGERY     left       Home Medications    Prior to Admission medications   Medication Sig Start Date End Date Taking? Authorizing Provider  acetaminophen (TYLENOL) 325 MG tablet Take 2 tablets (650 mg total) by mouth every 6 (six) hours as needed for moderate pain. 06/21/22   Wallis Bamberg, PA-C  apixaban (ELIQUIS) 5 MG TABS tablet Take 1 tablet (5 mg total) by mouth 2 (two) times daily. 01/15/23   Maisie Fus, MD  atorvastatin (LIPITOR) 40 MG tablet Take 1 tablet (40 mg total) by mouth daily. 09/17/22   Pincus Sanes, MD  diltiazem (CARDIZEM) 30 MG tablet Take 1 tablet (30 mg total) by mouth every 6 (six) hours as needed (for heart rate greater than 100 bpm.). 01/15/23   Maisie Fus, MD  Multiple Vitamins-Minerals (ADULT ONE DAILY GUMMIES) CHEW Chew 2 tablets by mouth daily. men    [provider]  triamcinolone cream (KENALOG) 0.1 % APPLY TOPICALLY TO THE AFFECTED AREA TWICE DAILY 12/04/22   Pincus Sanes, MD    Family History Family  History  Problem Relation Age of Onset   Kidney cancer Father    Colon cancer Father    Cancer Brother        ?   Heart attack Brother 55   Heart attack Sister 56   Breast cancer Sister    Diabetes Brother    Alcohol abuse Brother    Colon polyps Brother     Social History Social History   Tobacco Use   Smoking status: Never   Smokeless tobacco: Never  Vaping Use   Vaping status: Never Used  Substance Use Topics   Alcohol use: Not Currently    Alcohol/week: 1.0 standard drink of alcohol    Types: 1 Cans of beer per week   Drug use: No     Allergies   Patient has no known allergies.   Review of Systems Review of Systems  HENT:  Positive for congestion.   Respiratory:   Positive for cough.   Neurological:  Positive for weakness.     Physical Exam Triage Vital Signs ED Triage Vitals  Encounter Vitals Group     BP 02/07/23 1552 106/67     Systolic BP Percentile --      Diastolic BP Percentile --      Pulse Rate 02/07/23 1552 96     Resp 02/07/23 1552 16     Temp 02/07/23 1552 98.7 F (37.1 C)     Temp Source 02/07/23 1552 Oral     SpO2 02/07/23 1552 96 %     Weight --      Height --      Head Circumference --      Peak Flow --      Pain Score 02/07/23 1554 0     Pain Loc --      Pain Education --      Exclude from Growth Chart --    No data found.  Updated Vital Signs BP 106/67 (BP Location: Right Arm)   Pulse 96   Temp 98.7 F (37.1 C) (Oral)   Resp 16   SpO2 96%   Visual Acuity Right Eye Distance:   Left Eye Distance:   Bilateral Distance:    Right Eye Near:   Left Eye Near:    Bilateral Near:     Physical Exam Vitals and nursing note reviewed.  Constitutional:      General: He is not in acute distress.    Appearance: Normal appearance. He is not ill-appearing or toxic-appearing.  HENT:     Head: Normocephalic and atraumatic.     Right Ear: Tympanic membrane and ear canal normal.     Left Ear: Tympanic membrane and ear canal normal.     Nose: Congestion present.     Mouth/Throat:     Mouth: Mucous membranes are moist.     Pharynx: No oropharyngeal exudate or posterior oropharyngeal erythema.  Eyes:     Pupils: Pupils are equal, round, and reactive to light.  Cardiovascular:     Rate and Rhythm: Normal rate and regular rhythm.     Heart sounds: Normal heart sounds.  Pulmonary:     Effort: Pulmonary effort is normal.     Breath sounds: Normal breath sounds.  Musculoskeletal:     Cervical back: Normal range of motion and neck supple.  Lymphadenopathy:     Cervical: No cervical adenopathy.  Skin:    General: Skin is warm and dry.  Neurological:     General: No focal deficit present.  Mental Status: He is  alert and oriented to person, place, and time.  Psychiatric:        Mood and Affect: Mood normal.        Behavior: Behavior normal.      UC Treatments / Results  Labs (all labs ordered are listed, but only abnormal results are displayed) Labs Reviewed  POCT URINALYSIS DIP (MANUAL ENTRY) - Abnormal; Notable for the following components:      Result Value   Bilirubin, UA small (*)    Ketones, POC UA trace (5) (*)    Blood, UA trace-lysed (*)    Protein Ur, POC =100 (*)    All other components within normal limits  SARS CORONAVIRUS 2 (TAT 6-24 HRS)  POCT INFLUENZA A/B    EKG   Radiology DG Chest 2 View  Result Date: 02/07/2023 CLINICAL DATA:  Cough, chills EXAM: CHEST - 2 VIEW COMPARISON:  01/09/2023 FINDINGS: Cardiac size is within normal limits. There are no signs of pulmonary edema or focal pulmonary consolidation. Peribronchial thickening is seen. There is no pleural effusion or pneumothorax. There is surgical fusion in cervical spine. IMPRESSION: Peribronchial thickening suggest bronchitis. No focal pulmonary infiltrates are seen. There is no pleural effusion or pneumothorax. Electronically Signed   By: Ernie Avena M.D.   On: 02/07/2023 16:23    Procedures Procedures (including critical care time)  Medications Ordered in UC Medications - No data to display  Initial Impression / Assessment and Plan / UC Course  I have reviewed the triage vital signs and the nursing notes.  Pertinent labs & imaging results that were available during my care of the patient were reviewed by me and considered in my medical decision making (see chart for details).     Discussed symptoms and exam with patient.  UA negative for UTI, negative rapid flu.  Chest x-ray shows bronchitis otherwise no pneumonia or consolidation.  COVID PCR will be sent out and will contact if positive.  Discussed with patient concern over his reported extreme weakness with the inability to walk due to this, the  patient was able to walk to have his chest x-ray and provide urine.  Given his age, history and his reported extreme weakness advised to go to the ER for further evaluation.  He is in agreement with plan will go POV to the ER with his wife driving. Final Clinical Impressions(s) / UC Diagnoses   Final diagnoses:  Acute cough  Weakness  Viral illness     Discharge Instructions      Your flu test was negative.  Your chest x-ray was negative for pneumonia.  Your urine does not show that you have a UTI.  We send out your COVID test and we will contact you tomorrow if that is positive.  As you are reporting extreme weakness stating that you are unable to walk due to this I do advise that you go to the emergency room for further evaluation of your symptoms.     ED Prescriptions   None    PDMP not reviewed this encounter.   Radford Pax, NP 02/07/23 [phone]    Radford Pax, NP 02/07/23 [phone]

## 2023-02-08 LAB — SARS CORONAVIRUS 2 (TAT 6-24 HRS): SARS Coronavirus 2: NEGATIVE

## 2023-03-08 NOTE — Progress Notes (Unsigned)
   [name], PhD, LAT, ATC acting as a scribe for Chad Graham, MD.  Chad Norris is a 89 y.o. male who presents to [facility] today for 43-month f/u L hip pain. Pt was last seen by Dr. Denyse Amass on 12/10/22 and was given an interarticular L hip steroid injection.   Today, pt reports ***  Dx imaging: 10/31/22 L hip XR   Pertinent review of systems: ***  Relevant historical information: ***   Exam:  There were no vitals taken for this visit. General: Well Developed, well nourished, and in no acute distress.   MSK: ***    Lab and Radiology Results No results found for this or any previous visit (from the past 72 hour(s)). No results found.     Assessment and Plan: 89 y.o. male with ***   PDMP not reviewed this encounter. No orders of the defined types were placed in this encounter.  No orders of the defined types were placed in this encounter.    Discussed warning signs or symptoms. Please see discharge instructions. Patient expresses understanding.   ***

## 2023-03-11 ENCOUNTER — Ambulatory Visit: Payer: Medicare Other | Admitting: Family Medicine

## 2023-03-11 ENCOUNTER — Other Ambulatory Visit: Payer: Self-pay

## 2023-03-11 ENCOUNTER — Encounter: Payer: Self-pay | Admitting: Family Medicine

## 2023-03-11 VITALS — BP 132/78 | HR 60 | Ht 72.0 in | Wt 191.0 lb

## 2023-03-11 DIAGNOSIS — M25552 Pain in left hip: Secondary | ICD-10-CM | POA: Diagnosis not present

## 2023-03-11 DIAGNOSIS — M1612 Unilateral primary osteoarthritis, left hip: Secondary | ICD-10-CM | POA: Diagnosis not present

## 2023-03-11 NOTE — Patient Instructions (Addendum)
Thank you for coming in today.   You received an injection today. Seek immediate medical attention if the joint becomes red, extremely painful, or is oozing fluid.   I can repeat this injection in 3 months if needed, right around Thanksgiving  Check back as needed

## 2023-04-11 ENCOUNTER — Ambulatory Visit: Payer: Medicare Other | Admitting: Radiology

## 2023-04-11 DIAGNOSIS — Z23 Encounter for immunization: Secondary | ICD-10-CM

## 2023-04-11 NOTE — Progress Notes (Signed)
Patient here for HD flu shot, patient tolerate well with no complications.

## 2023-04-22 ENCOUNTER — Encounter: Payer: Self-pay | Admitting: Internal Medicine

## 2023-04-22 ENCOUNTER — Telehealth: Payer: Self-pay

## 2023-04-22 NOTE — Telephone Encounter (Signed)
Left voicemail to return call to office.

## 2023-05-16 NOTE — Addendum Note (Signed)
Addended by: Aundra Millet on: 05/16/2023 10:45 AM   Modules accepted: Orders

## 2023-05-24 NOTE — Addendum Note (Signed)
Addended by: Aundra Millet on: 05/24/2023 11:46 AM   Modules accepted: Orders

## 2023-06-10 NOTE — Progress Notes (Unsigned)
[name], PhD, LAT, ATC acting as a scribe for [name], MD.  Chad Norris is a 89 y.o. male who presents to [facility] at [facility] today for 4-month f/u L hip pain. Pt was last seen by Dr. Denyse Amass on 03/11/23 and was given a L intra-articular hip steroid injection.  Today, pt reports last steroid injection was very helpful. L hip pain just returned recently. He would like a repeat injection today.    He notes some right anterior knee pain as well.  He has been using a compression sleeve which helps.  Pain is worse with going up and down stairs.  Dx imaging: 10/31/22 L hip XR   Pertinent review of systems: No fevers or chills  Relevant historical information: Hard of hearing   Exam:  BP 118/68   Pulse 61   Ht 6' (1.829 m)   Wt 192 lb (87.1 kg)   SpO2 97%   BMI 26.04 kg/m  General: Well Developed, well nourished, and in no acute distress.   MSK: Left hip normal-appearing Pain with motion.  Right knee minimal effusion normal motion with crepitation.    Lab and Radiology Results  Procedure: Real-time Ultrasound Guided Injection of right anterior hip femoral acetabular joint Device: Philips Affiniti 50G/GE Logiq Images permanently stored and available for review in PACS Verbal informed consent obtained.  Discussed risks and benefits of procedure. Warned about infection, bleeding, hyperglycemia damage to structures among others. Patient expresses understanding and agreement Time-out conducted.   Noted no overlying erythema, induration, or other signs of local infection.   Skin prepped in a sterile fashion.   Local anesthesia: Topical Ethyl chloride.   With sterile technique and under real time ultrasound guidance: 40 mg of Kenalog and 2 mL of Marcaine injected into hip joint. Fluid seen entering the joint capsule.   Completed without difficulty   Pain immediately resolved suggesting accurate placement of the medication.   Advised to  call if fevers/chills, erythema, induration, drainage, or persistent bleeding.   Images permanently stored and available for review in the ultrasound unit.  Impression: Technically successful ultrasound guided injection.    X-ray images right knee obtained today personally and independently interpreted Moderate medial compartment DJD.  Mild patellofemoral DJD.  No acute fractures are visible. Await formal radiology review     Assessment and Plan: 89 y.o. male with left anterior hip pain due to DJD.  Plan for repeat steroid injection today.  Check back as needed.  Right knee pain due to DJD.  Continue compression sleeve and Voltaren gel.  Consider steroid injection if needed in the future.   PDMP not reviewed this encounter. Orders Placed This Encounter  Procedures   [place] LIMITED JOINT SPACE STRUCTURES LOW LEFT(NO LINKED CHARGES)    Order Specific Question:   Reason for Exam (SYMPTOM  OR DIAGNOSIS REQUIRED)    Answer:   left hip pain    Order Specific Question:   Preferred imaging location?    Answer:   [facility] Knee AP/LAT W/Sunrise Right    Standing Status:   Future    Number of Occurrences:   1    Standing Expiration Date:   07/11/2023    Order Specific Question:   Reason for Exam (SYMPTOM  OR DIAGNOSIS REQUIRED)    Answer:   right knee pain    Order Specific Question:   Preferred imaging location?    Answer:   [facility]  No orders of the defined types were placed in this encounter.    Discussed warning signs or symptoms. Please see discharge instructions. Patient expresses understanding.   The above documentation has been reviewed and is accurate and complete [name], M.D.

## 2023-06-11 ENCOUNTER — Ambulatory Visit (INDEPENDENT_AMBULATORY_CARE_PROVIDER_SITE_OTHER): Payer: Medicare Other

## 2023-06-11 ENCOUNTER — Other Ambulatory Visit: Payer: Self-pay

## 2023-06-11 ENCOUNTER — Ambulatory Visit: Payer: Medicare Other | Admitting: Family Medicine

## 2023-06-11 VITALS — BP 118/68 | HR 61 | Ht 72.0 in | Wt 192.0 lb

## 2023-06-11 DIAGNOSIS — M7651 Patellar tendinitis, right knee: Secondary | ICD-10-CM | POA: Diagnosis not present

## 2023-06-11 DIAGNOSIS — M1612 Unilateral primary osteoarthritis, left hip: Secondary | ICD-10-CM

## 2023-06-11 DIAGNOSIS — M25561 Pain in right knee: Secondary | ICD-10-CM

## 2023-06-11 DIAGNOSIS — M1711 Unilateral primary osteoarthritis, right knee: Secondary | ICD-10-CM | POA: Diagnosis not present

## 2023-06-11 DIAGNOSIS — G8929 Other chronic pain: Secondary | ICD-10-CM | POA: Diagnosis not present

## 2023-06-11 DIAGNOSIS — M25552 Pain in left hip: Secondary | ICD-10-CM | POA: Diagnosis not present

## 2023-06-11 NOTE — Patient Instructions (Addendum)
Thank you for coming in today.   Please get an Xray today before you leave   You received an injection today. Seek immediate medical attention if the joint becomes red, extremely painful, or is oozing fluid.   I can repeat this injection in 3 months if needed

## 2023-06-17 ENCOUNTER — Encounter: Payer: Self-pay | Admitting: Medical Oncology

## 2023-06-17 ENCOUNTER — Inpatient Hospital Stay: Payer: Medicare Other | Attending: Hematology & Oncology

## 2023-06-17 ENCOUNTER — Inpatient Hospital Stay: Payer: Medicare Other | Admitting: Medical Oncology

## 2023-06-17 VITALS — BP 138/57 | HR 52 | Temp 97.7°F | Resp 18 | Ht 72.0 in | Wt 188.0 lb

## 2023-06-17 DIAGNOSIS — D693 Immune thrombocytopenic purpura: Secondary | ICD-10-CM | POA: Insufficient documentation

## 2023-06-17 DIAGNOSIS — Z7901 Long term (current) use of anticoagulants: Secondary | ICD-10-CM | POA: Diagnosis not present

## 2023-06-17 DIAGNOSIS — D696 Thrombocytopenia, unspecified: Secondary | ICD-10-CM | POA: Diagnosis not present

## 2023-06-17 LAB — CMP (CANCER CENTER ONLY)
ALT: 13 U/L (ref 0–44)
AST: 16 U/L (ref 15–41)
Albumin: 4.7 g/dL (ref 3.5–5.0)
Alkaline Phosphatase: 45 U/L (ref 38–126)
Anion gap: 8 (ref 5–15)
BUN: 31 mg/dL — ABNORMAL HIGH (ref 8–23)
CO2: 29 mmol/L (ref 22–32)
Calcium: 10.3 mg/dL (ref 8.9–10.3)
Chloride: 102 mmol/L (ref 98–111)
Creatinine: 1.26 mg/dL — ABNORMAL HIGH (ref 0.61–1.24)
GFR, Estimated: 55 mL/min — ABNORMAL LOW (ref >60)
Glucose, Bld: 139 mg/dL — ABNORMAL HIGH (ref 70–99)
Potassium: 4.8 mmol/L (ref 3.5–5.1)
Sodium: 139 mmol/L (ref 135–145)
Total Bilirubin: 1.3 mg/dL — ABNORMAL HIGH (ref <1.2)
Total Protein: 7.4 g/dL (ref 6.5–8.1)

## 2023-06-17 LAB — CBC WITH DIFFERENTIAL (CANCER CENTER ONLY)
Abs Immature Granulocytes: 0.12 10*3/uL — ABNORMAL HIGH (ref 0.00–0.07)
Basophils Absolute: 0 10*3/uL (ref 0.0–0.1)
Basophils Relative: 0 %
Eosinophils Absolute: 0.1 10*3/uL (ref 0.0–0.5)
Eosinophils Relative: 2 %
HCT: 40.3 % (ref 39.0–52.0)
Hemoglobin: 13.6 g/dL (ref 13.0–17.0)
Immature Granulocytes: 2 %
Lymphocytes Relative: 36 %
Lymphs Abs: 2.9 10*3/uL (ref 0.7–4.0)
MCH: 31.8 pg (ref 26.0–34.0)
MCHC: 33.7 g/dL (ref 30.0–36.0)
MCV: 94.2 fL (ref 80.0–100.0)
Monocytes Absolute: 0.8 10*3/uL (ref 0.1–1.0)
Monocytes Relative: 10 %
Neutro Abs: 4.1 10*3/uL (ref 1.7–7.7)
Neutrophils Relative %: 50 %
Platelet Count: 130 10*3/uL — ABNORMAL LOW (ref 150–400)
RBC: 4.28 MIL/uL (ref 4.22–5.81)
RDW: 13.2 % (ref 11.5–15.5)
WBC Count: 8 10*3/uL (ref 4.0–10.5)
nRBC: 0 % (ref 0.0–0.2)

## 2023-06-17 LAB — SAVE SMEAR(SSMR), FOR PROVIDER SLIDE REVIEW

## 2023-06-17 LAB — LACTATE DEHYDROGENASE: LDH: 186 U/L (ref 98–192)

## 2023-06-17 NOTE — Progress Notes (Addendum)
Hematology and Oncology Follow Up Visit  Chad Norris [id number] [birth year] 89 y.o. 06/17/2023   Principle Diagnosis:  Chronic ITP  Current Therapy:   Observation   Interim History:  Chad Norris is here today for follow up of his thrombocytopenia:  He reports that he is doing well.  No recent falls Seen by orthopedics for chronic right hip pain- gets steroid injections every 90 days He does have history of stroke with left leg weakness.  He has A. Fib for which he takes Eliquis and Cardizem He has occasional blurry vision lasting only a few minutes.  No new falls, no syncope.  No swelling, tenderness, numbness or tingling in his extremities. No fever, chills, n/v, cough, rash, SOB, chest pain, palpitations, abdominal pain or changes in bowel or bladder habits.   Appetite is good Wt Readings from Last 3 Encounters:  06/17/23 188 lb (85.3 kg)  06/11/23 192 lb (87.1 kg)  03/11/23 191 lb (86.6 kg)   ECOG Performance Status: 1 - Symptomatic but completely ambulatory  Medications:  Allergies as of 06/17/2023   No Known Allergies      Medication List        Accurate as of June 17, 2023 10:31 AM. If you have any questions, ask your nurse or doctor.          acetaminophen 325 MG tablet Commonly known as: Tylenol Take 2 tablets (650 mg total) by mouth every 6 (six) hours as needed for moderate pain.   Adult One Daily Gummies Chew Chew 2 tablets by mouth daily. men   apixaban 5 MG Tabs tablet Commonly known as: ELIQUIS Take 1 tablet (5 mg total) by mouth 2 (two) times daily.   atorvastatin 40 MG tablet Commonly known as: LIPITOR Take 1 tablet (40 mg total) by mouth daily.   diltiazem 30 MG tablet Commonly known as: CARDIZEM Take 1 tablet (30 mg total) by mouth every 6 (six) hours as needed (for heart rate greater than 100 bpm.).   triamcinolone cream 0.1 % Commonly known as: KENALOG APPLY TOPICALLY TO THE AFFECTED AREA TWICE DAILY         Allergies: No Known Allergies  Past Medical History, Surgical history, Social history, and Family History were reviewed and updated.  Review of Systems: All other 10 point review of systems is negative.   Physical Exam:  height is 6' (1.829 m) and weight is 188 lb (85.3 kg). His oral temperature is 97.7 F (36.5 C). His blood pressure is 138/57 (abnormal) and his pulse is 52 (abnormal). His respiration is 18 and oxygen saturation is 100%.   Wt Readings from Last 3 Encounters:  06/17/23 188 lb (85.3 kg)  06/11/23 192 lb (87.1 kg)  03/11/23 191 lb (86.6 kg)    Ocular: Sclerae unicteric, pupils equal, round and reactive to light Ear-nose-throat: Oropharynx clear, dentition fair Lymphatic: No cervical or supraclavicular adenopathy Lungs no rales or rhonchi, good excursion bilaterally Heart regular rate and rhythm, no murmur appreciated Abd soft, nontender, positive bowel sounds MSK no focal spinal tenderness, no joint edema Neuro: non-focal, well-oriented, appropriate affect   Lab Results  Component Value Date   WBC 8.0 06/17/2023   HGB 13.6 06/17/2023   HCT 40.3 06/17/2023   MCV 94.2 06/17/2023   PLT 130 (L) 06/17/2023   Lab Results  Component Value Date   FERRITIN 307 07/06/2021   IRON 93 07/06/2021   TIBC 262 07/06/2021   IRONPCTSAT 35 07/06/2021   Lab Results  Component Value  Date   RBC 4.28 06/17/2023   No results found for: "KPAFRELGTCHN", "LAMBDASER", "KAPLAMBRATIO" No results found for: "IGGSERUM", "IGA", "IGMSERUM" No results found for: "TOTALPROTELP", "ALBUMINELP", "A1GS", "A2GS", "BETS", "BETA2SER", "GAMS", "MSPIKE", "SPEI"   Chemistry      Component Value Date/Time   NA 139 06/17/2023 0952   NA 139 03/01/2021 1031   K 4.8 06/17/2023 0952   CL 102 06/17/2023 0952   CO2 29 06/17/2023 0952   BUN 31 (H) 06/17/2023 0952   BUN 20 03/01/2021 1031   CREATININE 1.26 (H) 06/17/2023 0952      Component Value Date/Time   CALCIUM 10.3 06/17/2023 0952    ALKPHOS 45 06/17/2023 0952   AST 16 06/17/2023 0952   ALT 13 06/17/2023 0952   BILITOT 1.3 (H) 06/17/2023 0952     Encounter Diagnosis  Name Primary?   Thrombocytopenia (HCC) Yes     Impression and Plan:  Chad Norris is a very pleasant 89 yo caucasian gentleman with thrombocytopenia first noted in September 2022- suggestive chronic ITP on previous smear. He is on Eliquis for A. Fib for his history of stroke.   Today review of his counts show stability and improvement.  Platelets are 130 today which is up from 100 at last check  RTC 6 months APP/MD, labs (CBC w/, CMP, save smear, LDH)-[place]   Rushie Chestnut, PA-C [phone] AM

## 2023-06-24 ENCOUNTER — Encounter: Payer: Self-pay | Admitting: Internal Medicine

## 2023-06-24 ENCOUNTER — Ambulatory Visit: Payer: Medicare Other | Attending: Internal Medicine | Admitting: Internal Medicine

## 2023-06-24 VITALS — BP 124/72 | HR 47 | Ht 72.0 in | Wt 191.4 lb

## 2023-06-24 DIAGNOSIS — I480 Paroxysmal atrial fibrillation: Secondary | ICD-10-CM | POA: Diagnosis not present

## 2023-06-24 MED ORDER — DILTIAZEM HCL 30 MG PO TABS: 30.0000 mg | ORAL_TABLET | Freq: Four times a day (QID) | ORAL | 3 refills | Status: AC | PRN

## 2023-06-24 MED ORDER — APIXABAN 5 MG PO TABS: 5.0000 mg | ORAL_TABLET | Freq: Two times a day (BID) | ORAL | 3 refills | Status: DC

## 2023-06-24 NOTE — Progress Notes (Unsigned)
Cardiology Office Note:    Date:  06/24/2023   ID:  Chad Norris, DOB June 09, 1932, MRN [id number]  PCP:  Pincus Sanes, MD   [facility] Providers Cardiologist:  None     Referring MD: Pincus Sanes, MD   No chief complaint on file. PAF  History of Present Illness:    Chad Norris is a 89 y.o. male with a hx of stroke 2000 referral for paroxysmal afib. He was seen in the ED for generalized weakness . EKG shows afib with LBBB. TTE 08/19/2018  EF 50-55%, nl RV fxn. Normal left atrium. No significant valve dx.  He was offered [facility], but he wanted to discuss with cardiology first.  He notes hx of thrombocytopenia, unknown etiology. Otherwise, he has no hx of cardiac dx.  Interval Hx 06/24/2023 Chad Norris comes in today for follow-up.  He has been seen by medical oncology for thrombocytopenia and has chronic ITP. Blood pressure is normal. Notes some intermittent possible afib episodes.  She states after taking the diltiazem as needed his symptoms resolved within an hour. He denies CP or SOB.   Current Medications: Current Outpatient Medications on File Prior to Visit  Medication Sig Dispense Refill   acetaminophen (TYLENOL) 325 MG tablet Take 2 tablets (650 mg total) by mouth every 6 (six) hours as needed for moderate pain. 30 tablet 0   apixaban (ELIQUIS) 5 MG TABS tablet Take 1 tablet (5 mg total) by mouth 2 (two) times daily. 180 tablet 3   atorvastatin (LIPITOR) 40 MG tablet Take 1 tablet (40 mg total) by mouth daily. 90 tablet 0   diltiazem (CARDIZEM) 30 MG tablet Take 1 tablet (30 mg total) by mouth every 6 (six) hours as needed (for heart rate greater than 100 bpm.). 60 tablet 3   Multiple Vitamins-Minerals (ADULT ONE DAILY GUMMIES) CHEW Chew 2 tablets by mouth daily. men     triamcinolone cream (KENALOG) 0.1 % APPLY TOPICALLY TO THE AFFECTED AREA TWICE DAILY 30 g 0   No current facility-administered medications on file prior to visit.     Family  History: The patient's family history includes Alcohol abuse in his brother; Breast cancer in his sister; Cancer in his brother; Colon cancer in his father; Colon polyps in his brother; Diabetes in his brother; Heart attack (age of onset: 37) in his brother; Heart attack (age of onset: 79) in his sister; Kidney cancer in his father.  ROS:   Please see the history of present illness.     All other systems reviewed and are negative.  EKGs/Labs/Other Studies Reviewed:    The following studies were reviewed today:  EKG Interpretation Date/Time:  Monday June 24 2023 09:17:42 EST Ventricular Rate:  47 PR Interval:  218 QRS Duration:  136 QT Interval:  488 QTC Calculation: 431 R Axis:   -30  Text Interpretation: Sinus bradycardia with 1st degree A-V block Left axis deviation Left bundle Alain Deschene block When compared with ECG of 15-Jan-2023 09:05, No significant change was found Confirmed by Carolan Clines ([id number]) on 06/24/2023 9:24:10 AM    Recent Labs: 01/09/2023: Magnesium 2.1 06/17/2023: ALT 13; BUN 31; Creatinine 1.26; Hemoglobin 13.6; Platelet Count 130; Potassium 4.8; Sodium 139   Recent Lipid Panel    Component Value Date/Time   CHOL 195 06/06/2022 1025   CHOL 127 04/09/2019 1540   CHOL 216 (H) 01/13/2014 1037   TRIG 170.0 (H) 06/06/2022 1025   TRIG 245 (H) 01/13/2014 1037   HDL  34.90 (L) 06/06/2022 1025   HDL 41 04/09/2019 1540   HDL 41 01/13/2014 1037   CHOLHDL 6 06/06/2022 1025   VLDL 34.0 06/06/2022 1025   LDLCALC 126 (H) 06/06/2022 1025   LDLCALC 65 04/09/2019 1540   LDLCALC 126 (H) 01/13/2014 1037   LDLDIRECT 122.0 03/27/2018 0852     Risk Assessment/Calculations:    CHA2DS2-VASc Score = 4   This indicates a 4.8% annual risk of stroke. The patient's score is based upon: CHF History: 0 HTN History: 0 Diabetes History: 0 Stroke History: 2 Vascular Disease History: 0 Age Score: 2 Gender Score: 0   {This patient has a significant risk of stroke if diagnosed  with atrial fibrillation.  Please consider VKA or DOAC agent for anticoagulation if the bleeding risk is acceptable.   You can also use the SmartPhrase .[email or web address] for documentation.   :[id number]}        Physical Exam:    VS:   Vitals:   06/24/23 0920  BP: 124/72  Pulse: (!) 47  SpO2: 99%     Wt Readings from Last 3 Encounters:  06/24/23 191 lb 6.4 oz (86.8 kg)  06/17/23 188 lb (85.3 kg)  06/11/23 192 lb (87.1 kg)     GEN:  Well nourished, well developed in no acute distress HEENT: Normal NECK: No JVD; No carotid bruits CARDIAC: RRR, no murmurs, rubs, gallops RESPIRATORY:  Clear to auscultation without rales, wheezing or rhonchi  ABDOMEN: Soft, non-tender, non-distended MUSCULOSKELETAL:  No edema; No deformity  SKIN: Warm and dry NEUROLOGIC:  Alert and oriented x 3 PSYCHIATRIC:  Normal affect   ASSESSMENT:    Paroxysmal Atrial Fibrillation - continue eliquis 5 mg BID; will monitor platelets  - continue diltiazem 30 mg q6H PRN - prior TTE EF 50-55%, normal LA, no sig valve dx   HLD - continue lipitor 40 mg daily  Chronic ITP - plts are stable PLAN:    In order of problems listed above:   Follow up 6 months  Medication Adjustments/Labs and Tests Ordered: Current medicines are reviewed at length with the patient today.  Concerns regarding medicines are outlined above.  Orders Placed This Encounter  Procedures   EKG 12-Lead   No orders of the defined types were placed in this encounter.   There are no Patient Instructions on file for this visit.   Signed, Maisie Fus, MD  06/24/2023 9:24 AM    [facility]

## 2023-06-24 NOTE — Patient Instructions (Signed)
Medication Instructions:  No Changes  *If you need a refill on your cardiac medications before your next appointment, please call your pharmacy*   Follow-Up: At [facility], you and your health needs are our priority.  As part of our continuing mission to provide you with exceptional heart care, we have created designated Provider Care Teams.  These Care Teams include your primary Cardiologist (physician) and Advanced Practice Providers (APPs -  Physician Assistants and Nurse Practitioners) who all work together to provide you with the care you need, when you need it.  We recommend signing up for the patient portal called "MyChart".  Sign up information is provided on this After Visit Summary.  MyChart is used to connect with patients for Virtual Visits (Telemedicine).  Patients are able to view lab/test results, encounter notes, upcoming appointments, etc.  Non-urgent messages can be sent to your provider as well.   To learn more about what you can do with MyChart, go to [email or web address].    Your next appointment:   6 month(s)  Provider:   Dr.Mary Wyline Mood

## 2023-06-27 NOTE — Progress Notes (Signed)
Right knee x-ray shows medium arthritis

## 2023-07-01 ENCOUNTER — Other Ambulatory Visit: Payer: Self-pay | Admitting: Internal Medicine

## 2023-07-01 DIAGNOSIS — E7849 Other hyperlipidemia: Secondary | ICD-10-CM

## 2023-08-21 ENCOUNTER — Telehealth: Payer: Self-pay | Admitting: Internal Medicine

## 2023-08-21 NOTE — Telephone Encounter (Signed)
Patient dropped off document Handicap Placard, to be filled out by provider. Patient requested to send it back via Call Patient to pick up within 7-days. Document is located in providers tray at front office.Please advise at  [phone]

## 2023-08-22 NOTE — Telephone Encounter (Signed)
Patient's wife notified that form ready for pick up

## 2023-08-22 NOTE — Telephone Encounter (Signed)
Form complete and awaiting signature from Dr. Lawerance Bach.

## 2023-09-10 ENCOUNTER — Other Ambulatory Visit: Payer: Self-pay

## 2023-09-10 ENCOUNTER — Encounter: Payer: Self-pay | Admitting: Family Medicine

## 2023-09-10 ENCOUNTER — Ambulatory Visit: Payer: Medicare Other | Admitting: Family Medicine

## 2023-09-10 VITALS — BP 120/78 | HR 55 | Ht 72.0 in | Wt 188.0 lb

## 2023-09-10 DIAGNOSIS — M25561 Pain in right knee: Secondary | ICD-10-CM | POA: Diagnosis not present

## 2023-09-10 DIAGNOSIS — M1612 Unilateral primary osteoarthritis, left hip: Secondary | ICD-10-CM

## 2023-09-10 DIAGNOSIS — M25552 Pain in left hip: Secondary | ICD-10-CM

## 2023-09-10 DIAGNOSIS — G8929 Other chronic pain: Secondary | ICD-10-CM | POA: Diagnosis not present

## 2023-09-10 NOTE — Progress Notes (Signed)
 I, Chad Norris, CMA acting as a scribe for Chad Graham, MD.  Chad Norris is a 89 y.o. male who presents to [facility] today for  52-month f/u L hip and R knee pain. Pt was last seen by Dr. Denyse Amass on 06/11/23 and was given a L inter-articular hip injection and advised to use Voltaren gel and a compression sleeve on his knee.  Today, pt reports worsening pain in the left hip over the past month. Feels that the hip will give out at times. Also notes occasional radiating pain into the gluteal region and leg. Will have to apply pressure at lateral aspect while ambulating to help with pain. Sx worse after prolonged time ambulatory. Would like to discuss surgical consult. Has been wearing compression/bracing for the knees which has been helpful for knee sx. Denies swelling around the knee. Taking Tylenol in the mornings with short-term relief.   Dx imaging: 06/11/23 R knee XR 10/31/22 L hip XR   Pertinent review of systems: No fevers or chills  Relevant historical information: Atrial fib on Eliquis otherwise pretty healthy.   Exam:  BP 120/78   Pulse (!) 55   Ht 6' (1.829 m)   Wt 188 lb (85.3 kg)   SpO2 99%   BMI 25.50 kg/m  General: Well Developed, well nourished, and in no acute distress.   MSK: Left hip decreased range of motion.    Lab and Radiology Results  EXAM: DG HIP (WITH OR WITHOUT PELVIS) 2-3V LEFT   COMPARISON:  None Available.   FINDINGS: Degenerative changes in the bilateral hips, left greater than right. Partial joint space loss on the left. No fracture or dislocation. No bony lesions. Surgical clips in the pelvis.   IMPRESSION: Degenerative changes in the bilateral hips, left greater than right. Partial joint space loss on the left.     Electronically Signed   By: Gerome Sam III M.D.   On: 11/01/2022 17:16 I, Chad Norris, personally (independently) visualized and performed the interpretation of the images  attached in this note.     Assessment and Plan: 89 y.o. male with left hip pain.  Patient does have DJD on x-ray and has had good but somewhat temporary relief with intra-articular injections in that left hip.  Most recent injection was 3 months ago and only lasted about 2 months.  I am not optimistic that further injections will provide long-lasting benefit.  He is healthy enough that he is a pretty good surgical candidate potentially.  I think it is worth this time just to have a discussion with an orthopedic surgeon about his options.  He is willing to consider surgery.  I do not want to do an injection today if he is gena see a surgeon soon to consider orthopedic surgery.  Cortisone shot today will limit his ability to have surgery for 3 months.  Happy to do an injection ASAP after talking with surgeon.  Referral placed today.   PDMP not reviewed this encounter. Orders Placed This Encounter  Procedures   [place] LIMITED JOINT SPACE STRUCTURES LOW LEFT(NO LINKED CHARGES)    Reason for Exam (SYMPTOM  OR DIAGNOSIS REQUIRED):   left hip, right knee    Preferred imaging location?:   [facility] referral to Orthopedic Surgery    Referral Priority:   Routine    Referral Type:   Surgical    Referral Reason:   Specialty Services Required  Referred to Provider:   Kathryne Hitch, MD    Requested Specialty:   Orthopedic Surgery    Number of Visits Requested:   1   No orders of the defined types were placed in this encounter.    Discussed warning signs or symptoms. Please see discharge instructions. Patient expresses understanding.   The above documentation has been reviewed and is accurate and complete Chad Norris, M.D.

## 2023-09-10 NOTE — Patient Instructions (Addendum)
 Thank you for coming in today.   I've referred you to Dr. Magnus Ivan for a surgical consultation.  Let us know if you don't hear from them in one week.   After your visit with Dr. Magnus Ivan, let me know if you need a shot

## 2023-09-28 ENCOUNTER — Other Ambulatory Visit: Payer: Self-pay | Admitting: Internal Medicine

## 2023-09-28 DIAGNOSIS — E7849 Other hyperlipidemia: Secondary | ICD-10-CM

## 2023-09-30 ENCOUNTER — Other Ambulatory Visit: Payer: Self-pay | Admitting: Internal Medicine

## 2023-09-30 DIAGNOSIS — E7849 Other hyperlipidemia: Secondary | ICD-10-CM

## 2023-10-07 ENCOUNTER — Ambulatory Visit: Payer: Medicare Other | Admitting: Orthopaedic Surgery

## 2023-10-07 DIAGNOSIS — M1612 Unilateral primary osteoarthritis, left hip: Secondary | ICD-10-CM | POA: Diagnosis not present

## 2023-10-07 DIAGNOSIS — M25552 Pain in left hip: Secondary | ICD-10-CM

## 2023-10-07 NOTE — Progress Notes (Signed)
 The patient is a very pleasant 89 year old active gentleman who was sent from Dr. [name] to evaluate and treat known arthritis of his left hip.  He does report pain in the groin for the last several months now.  He has been dealing with this pain for several years now.  He has had steroid injections by Dr. Denyse Amass in the left hip joint and that helped temporize his pain for a while.  He has gotten to the point though where his left hip pain is daily and it is detrimentally affecting his mobility, his quality of life and his actives of daily living.  He does see cardiologist regularly.  He has a history of being on Eliquis due to history of a stroke.  He is otherwise very active and is interested in considering hip replacement surgery at this point for his left hip.  His wife is with him today as well.  They live at home and are active.  Examination of his left hip shows significant pain with internal and external rotation of the hip and there is pain in the groin.  The hip joint is stiff itself as well.  X-rays from last year in the [facility] show severe arthritis of the left hip with complete loss of the superior lateral joint space, overhanging osteophytes of the acetabulum and flattening of the superior lateral femoral head.  We had a long and thorough discussion about hip replacement surgery.  I discussed the risks and benefits of the surgery and what to expect from an intraoperative and postoperative standpoint.  He will need to be off Eliquis 3 full days prior to surgery in order to have spinal anesthesia.  All questions and concerns were answered and addressed.  We will work on getting him scheduled for the surgery in the near future.

## 2023-10-08 ENCOUNTER — Telehealth: Payer: Self-pay | Admitting: Cardiology

## 2023-10-08 ENCOUNTER — Encounter: Payer: Self-pay | Admitting: Internal Medicine

## 2023-10-08 NOTE — Telephone Encounter (Signed)
 Patient wants to see Dr. Bjorn Pippin now that Dr. Wyline Mood is leaving. Please advise

## 2023-10-09 NOTE — Telephone Encounter (Signed)
 Yes that is fine

## 2023-10-16 ENCOUNTER — Encounter: Payer: Self-pay | Admitting: Orthopaedic Surgery

## 2023-10-31 ENCOUNTER — Encounter: Payer: Self-pay | Admitting: Internal Medicine

## 2023-11-01 NOTE — Progress Notes (Signed)
 Please place orders for PAT appointment scheduled 11/04/23.

## 2023-11-01 NOTE — Progress Notes (Addendum)
 COVID Vaccine Completed: yes  Date of COVID positive in last 90 days:  PCP - Oma Bias, MD Cardiologist - Alois Arnt, MD LOV 06/24/23- DR. Schaumann  Chest x-ray - 02/07/23 Epic EKG - 06/24/23 Epic Stress Test - 2009 ECHO - 08/19/18 Epic Cardiac Cath - n/a Pacemaker/ICD device last checked:n/a Spinal Cord Stimulator:n/a  Bowel Prep - no  Sleep Study - years ago per pt, no sleep apnea CPAP -   Fasting Blood Sugar - preDM Checks Blood Sugar _____ times a day  Last dose of GLP1 agonist-  N/A GLP1 instructions:  Hold 7 days before surgery    Last dose of SGLT-2 inhibitors-  N/A SGLT-2 instructions:  Hold 3 days before surgery    Blood Thinner Instructions:  Eliquis, hold 3 days Aspirin Instructions: Last Dose: 11/11/23 1300  Activity level: Can go up a flight of stairs and perform activities of daily living without stopping and without symptoms of chest pain or shortness of breath.   Anesthesia review: PAF, thrombocytopenia, CVA, 1st degree AV block, Platelets 91  Patient denies shortness of breath, fever, cough and chest pain at PAT appointment  Patient verbalized understanding of instructions that were given to them at the PAT appointment. Patient was also instructed that they will need to review over the PAT instructions again at home before surgery.

## 2023-11-04 ENCOUNTER — Other Ambulatory Visit: Payer: Self-pay

## 2023-11-04 ENCOUNTER — Encounter (HOSPITAL_COMMUNITY): Payer: Self-pay

## 2023-11-04 ENCOUNTER — Encounter (HOSPITAL_COMMUNITY)
Admission: RE | Admit: 2023-11-04 | Discharge: 2023-11-04 | Disposition: A | Discharge status: Home / Self Care | Source: Ambulatory Visit | Attending: Orthopaedic Surgery | Admitting: Orthopaedic Surgery

## 2023-11-04 VITALS — BP 154/80 | HR 23 | Temp 98.5°F | Resp 16 | Ht 72.0 in

## 2023-11-04 DIAGNOSIS — M1612 Unilateral primary osteoarthritis, left hip: Secondary | ICD-10-CM | POA: Diagnosis not present

## 2023-11-04 DIAGNOSIS — I480 Paroxysmal atrial fibrillation: Secondary | ICD-10-CM | POA: Diagnosis not present

## 2023-11-04 DIAGNOSIS — Z01818 Encounter for other preprocedural examination: Secondary | ICD-10-CM | POA: Insufficient documentation

## 2023-11-04 DIAGNOSIS — Z8673 Personal history of transient ischemic attack (TIA), and cerebral infarction without residual deficits: Secondary | ICD-10-CM | POA: Diagnosis not present

## 2023-11-04 HISTORY — DX: Pneumonia, unspecified organism: J18.9

## 2023-11-04 HISTORY — DX: Unspecified atrial fibrillation: I48.91

## 2023-11-04 HISTORY — DX: Unspecified osteoarthritis, unspecified site: M19.90

## 2023-11-04 LAB — BASIC METABOLIC PANEL WITH GFR
Anion gap: 9 (ref 5–15)
BUN: 22 mg/dL (ref 8–23)
CO2: 24 mmol/L (ref 22–32)
Calcium: 9.2 mg/dL (ref 8.9–10.3)
Chloride: 103 mmol/L (ref 98–111)
Creatinine, Ser: 1.13 mg/dL (ref 0.61–1.24)
GFR, Estimated: >60 mL/min (ref >60)
Glucose, Bld: 87 mg/dL (ref 70–99)
Potassium: 4.6 mmol/L (ref 3.5–5.1)
Sodium: 136 mmol/L (ref 135–145)

## 2023-11-04 LAB — CBC
HCT: 39.9 % (ref 39.0–52.0)
Hemoglobin: 13.2 g/dL (ref 13.0–17.0)
MCH: 31.7 pg (ref 26.0–34.0)
MCHC: 33.1 g/dL (ref 30.0–36.0)
MCV: 95.9 fL (ref 80.0–100.0)
Platelets: 91 10*3/uL — ABNORMAL LOW (ref 150–400)
RBC: 4.16 MIL/uL — ABNORMAL LOW (ref 4.22–5.81)
RDW: 12.8 % (ref 11.5–15.5)
WBC: 5.7 10*3/uL (ref 4.0–10.5)
nRBC: 0 % (ref 0.0–0.2)

## 2023-11-04 LAB — SURGICAL PCR SCREEN
MRSA, PCR: NEGATIVE
Staphylococcus aureus: NEGATIVE

## 2023-11-04 NOTE — Progress Notes (Signed)
Platelets 91, results routed to Dr. Ninfa Linden

## 2023-11-04 NOTE — Patient Instructions (Signed)
 SURGICAL WAITING ROOM VISITATION  Patients having surgery or a procedure may have no more than 2 support people in the waiting area - these visitors may rotate.    Children under the age of 62 must have an adult with them who is not the patient.  Due to an increase in RSV and influenza rates and associated hospitalizations, children ages 104 and under may not visit patients in [facility] hospitals.  Visitors with respiratory illnesses are discouraged from visiting and should remain at home.  If the patient needs to stay at the hospital during part of their recovery, the visitor guidelines for inpatient rooms apply. Pre-op nurse will coordinate an appropriate time for 1 support person to accompany patient in pre-op.  This support person may not rotate.    Please refer to the [facility] website for the visitor guidelines for Inpatients (after your surgery is over and you are in a regular room).      Your procedure is scheduled on: 11/15/23   Report to [facility] Main Entrance    Report to admitting at 10:15 AM   Call this number if you have problems the morning of surgery [phone]   Do not eat food :After Midnight.   After Midnight you may have the following liquids until 9:45 AM DAY OF SURGERY  Water Non-Citrus Juices (without pulp, NO RED-Apple, White grape, White cranberry) Black Coffee (NO MILK/CREAM OR CREAMERS, sugar ok)  Clear Tea (NO MILK/CREAM OR CREAMERS, sugar ok) regular and decaf                             Plain Jell-O (NO RED)                                           Fruit ices (not with fruit pulp, NO RED)                                     Popsicles (NO RED)                                                               Sports drinks like Gatorade (NO RED)               The day of surgery:  Drink ONE (1) Pre-Surgery Clear Ensure at 9:45 AM the morning of surgery. Drink in one sitting. Do not sip.  This drink was given to you during your hospital   pre-op appointment visit. Nothing else to drink after completing the  Pre-Surgery Clear Ensure.          If you have questions, please contact your surgeon's office.   FOLLOW BOWEL PREP AND ANY ADDITIONAL PRE OP INSTRUCTIONS YOU RECEIVED FROM YOUR SURGEON'S OFFICE!!!     Oral Hygiene is also important to reduce your risk of infection.                                    Remember -  BRUSH YOUR TEETH THE MORNING OF SURGERY WITH YOUR REGULAR TOOTHPASTE  DENTURES WILL BE REMOVED PRIOR TO SURGERY PLEASE DO NOT APPLY "Poly grip" OR ADHESIVES!!!   Stop all vitamins and herbal supplements 7 days before surgery.   Take these medicines the morning of surgery with A SIP OF WATER: Tylenol, Diltiazem if needed  These are anesthesia recommendations for holding your anticoagulants.  Please contact your prescribing physician to confirm IF it is safe to hold your anticoagulants for this length of time.   Eliquis Apixaban   72 hours   Xarelto Rivaroxaban   72 hours  Plavix Clopidogrel   120 hours  Pletal Cilostazol   120 hours                                You may not have any metal on your body including jewelry, and body piercing             Do not wear lotions, powders, cologne, or deodorant              Men may shave face and neck.   Do not bring valuables to the hospital. [facility] IS NOT             RESPONSIBLE   FOR VALUABLES.   Contacts, glasses, dentures or bridgework may not be worn into surgery.   Bring small overnight bag day of surgery.   DO NOT BRING YOUR HOME MEDICATIONS TO THE HOSPITAL. PHARMACY WILL DISPENSE MEDICATIONS LISTED ON YOUR MEDICATION LIST TO YOU DURING YOUR ADMISSION IN THE HOSPITAL!   Special Instructions: Bring a copy of your healthcare power of attorney and living will documents the day of surgery if you haven't scanned them before.              Please read over the following fact sheets you were given: IF YOU HAVE QUESTIONS ABOUT YOUR PRE-OP  INSTRUCTIONS PLEASE CALL [name]   If you received a COVID test during your pre-op visit  it is requested that you wear a mask when out in public, stay away from anyone that may not be feeling well and notify your surgeon if you develop symptoms. If you test positive for Covid or have been in contact with anyone that has tested positive in the last 10 days please notify you surgeon.      Pre-operative 5 CHG Bath Instructions   You can play a key role in reducing the risk of infection after surgery. Your skin needs to be as free of germs as possible. You can reduce the number of germs on your skin by washing with CHG (chlorhexidine gluconate) soap before surgery. CHG is an antiseptic soap that kills germs and continues to kill germs even after washing.   DO NOT use if you have an allergy to chlorhexidine/CHG or antibacterial soaps. If your skin becomes reddened or irritated, stop using the CHG and notify one of our RNs at [phone].   Please shower with the CHG soap starting 4 days before surgery using the following schedule:     Please keep in mind the following:  DO NOT shave, including legs and underarms, starting the day of your first shower.   You may shave your face at any point before/day of surgery.  Place clean sheets on your bed the day you start using CHG soap. Use a clean washcloth (not used since being washed) for each shower.  DO NOT sleep with pets once you start using the CHG.   CHG Shower Instructions:  If you choose to wash your hair and private area, wash first with your normal shampoo/soap.  After you use shampoo/soap, rinse your hair and body thoroughly to remove shampoo/soap residue.  Turn the water OFF and apply about 3 tablespoons (45 ml) of CHG soap to a CLEAN washcloth.  Apply CHG soap ONLY FROM YOUR NECK DOWN TO YOUR TOES (washing for 3-5 minutes)  DO NOT use CHG soap on face, private areas, open wounds, or sores.  Pay special attention to the  area where your surgery is being performed.  If you are having back surgery, having someone wash your back for you may be helpful. Wait 2 minutes after CHG soap is applied, then you may rinse off the CHG soap.  Pat dry with a clean towel  Put on clean clothes/pajamas   If you choose to wear lotion, please use ONLY the CHG-compatible lotions on the back of this paper.     Additional instructions for the day of surgery: DO NOT APPLY any lotions, deodorants, cologne, or perfumes.   Put on clean/comfortable clothes.  Brush your teeth.  Ask your nurse before applying any prescription medications to the skin.      CHG Compatible Lotions   Aveeno Moisturizing lotion  Cetaphil Moisturizing Cream  Cetaphil Moisturizing Lotion  Clairol Herbal Essence Moisturizing Lotion, Dry Skin  Clairol Herbal Essence Moisturizing Lotion, Extra Dry Skin  Clairol Herbal Essence Moisturizing Lotion, Normal Skin  Curel Age Defying Therapeutic Moisturizing Lotion with Alpha Hydroxy  Curel Extreme Care Body Lotion  Curel Soothing Hands Moisturizing Hand Lotion  Curel Therapeutic Moisturizing Cream, Fragrance-Free  Curel Therapeutic Moisturizing Lotion, Fragrance-Free  Curel Therapeutic Moisturizing Lotion, Original Formula  Eucerin Daily Replenishing Lotion  Eucerin Dry Skin Therapy Plus Alpha Hydroxy Crme  Eucerin Dry Skin Therapy Plus Alpha Hydroxy Lotion  Eucerin Original Crme  Eucerin Original Lotion  Eucerin Plus Crme Eucerin Plus Lotion  Eucerin TriLipid Replenishing Lotion  Keri Anti-Bacterial Hand Lotion  Keri Deep Conditioning Original Lotion Dry Skin Formula Softly Scented  Keri Deep Conditioning Original Lotion, Fragrance Free Sensitive Skin Formula  Keri Lotion Fast Absorbing Fragrance Free Sensitive Skin Formula  Keri Lotion Fast Absorbing Softly Scented Dry Skin Formula  Keri Original Lotion  Keri Skin Renewal Lotion Keri Silky Smooth Lotion  Keri Silky Smooth Sensitive Skin Lotion   Nivea Body Creamy Conditioning Oil  Nivea Body Extra Enriched Lotion  Nivea Body Original Lotion  Nivea Body Sheer Moisturizing Lotion Nivea Crme  Nivea Skin Firming Lotion  NutraDerm 30 Skin Lotion  NutraDerm Skin Lotion  NutraDerm Therapeutic Skin Cream  NutraDerm Therapeutic Skin Lotion  ProShield Protective Hand Cream  Provon moisturizing lotion   Incentive Spirometer  An incentive spirometer is a tool that can help keep your lungs clear and active. This tool measures how well you are filling your lungs with each breath. Taking long deep breaths may help reverse or decrease the chance of developing breathing (pulmonary) problems (especially infection) following: A long period of time when you are unable to move or be active. BEFORE THE PROCEDURE  If the spirometer includes an indicator to show your best effort, your nurse or respiratory therapist will set it to a desired goal. If possible, sit up straight or lean slightly forward. Try not to slouch. Hold the incentive spirometer in an upright position. INSTRUCTIONS FOR USE  Sit on the edge of your bed  if possible, or sit up as far as you can in bed or on a chair. Hold the incentive spirometer in an upright position. Breathe out normally. Place the mouthpiece in your mouth and seal your lips tightly around it. Breathe in slowly and as deeply as possible, raising the piston or the ball toward the top of the column. Hold your breath for 3-5 seconds or for as long as possible. Allow the piston or ball to fall to the bottom of the column. Remove the mouthpiece from your mouth and breathe out normally. Rest for a few seconds and repeat Steps 1 through 7 at least 10 times every 1-2 hours when you are awake. Take your time and take a few normal breaths between deep breaths. The spirometer may include an indicator to show your best effort. Use the indicator as a goal to work toward during each repetition. After each set of 10 deep breaths,  practice coughing to be sure your lungs are clear. If you have an incision (the cut made at the time of surgery), support your incision when coughing by placing a pillow or rolled up towels firmly against it. Once you are able to get out of bed, walk around indoors and cough well. You may stop using the incentive spirometer when instructed by your caregiver.  RISKS AND COMPLICATIONS Take your time so you do not get dizzy or light-headed. If you are in pain, you may need to take or ask for pain medication before doing incentive spirometry. It is harder to take a deep breath if you are having pain. AFTER USE Rest and breathe slowly and easily. It can be helpful to keep track of a log of your progress. Your caregiver can provide you with a simple table to help with this. If you are using the spirometer at home, follow these instructions: SEEK MEDICAL CARE IF:  You are having difficultly using the spirometer. You have trouble using the spirometer as often as instructed. Your pain medication is not giving enough relief while using the spirometer. You develop fever of 100.5 F (38.1 C) or higher. SEEK IMMEDIATE MEDICAL CARE IF:  You cough up bloody sputum that had not been present before. You develop fever of 102 F (38.9 C) or greater. You develop worsening pain at or near the incision site. MAKE SURE YOU:  Understand these instructions. Will watch your condition. Will get help right away if you are not doing well or get worse. Document Released: 11/19/2006 Document Revised: 10/01/2011 Document Reviewed: 01/20/2007 Exit[employer] Patient Information 2014 [employer], [place].   ________________________________________________________________________ WHAT IS A BLOOD TRANSFUSION? Blood Transfusion Information  A transfusion is the replacement of blood or some of its parts. Blood is made up of multiple cells which provide different functions. Red blood cells carry oxygen and are used for blood loss  replacement. White blood cells fight against infection. Platelets control bleeding. Plasma helps clot blood. Other blood products are available for specialized needs, such as hemophilia or other clotting disorders. BEFORE THE TRANSFUSION  Who gives blood for transfusions?  Healthy volunteers who are fully evaluated to make sure their blood is safe. This is blood bank blood. Transfusion therapy is the safest it has ever been in the practice of medicine. Before blood is taken from a donor, a complete history is taken to make sure that person has no history of diseases nor engages in risky social behavior (examples are intravenous drug use or sexual activity with multiple partners). The donor's travel history is  screened to minimize risk of transmitting infections, such as malaria. The donated blood is tested for signs of infectious diseases, such as HIV and hepatitis. The blood is then tested to be sure it is compatible with you in order to minimize the chance of a transfusion reaction. If you or a relative donates blood, this is often done in anticipation of surgery and is not appropriate for emergency situations. It takes many days to process the donated blood. RISKS AND COMPLICATIONS Although transfusion therapy is very safe and saves many lives, the main dangers of transfusion include:  Getting an infectious disease. Developing a transfusion reaction. This is an allergic reaction to something in the blood you were given. Every precaution is taken to prevent this. The decision to have a blood transfusion has been considered carefully by your caregiver before blood is given. Blood is not given unless the benefits outweigh the risks. AFTER THE TRANSFUSION Right after receiving a blood transfusion, you will usually feel much better and more energetic. This is especially true if your red blood cells have gotten low (anemic). The transfusion raises the level of the red blood cells which carry oxygen, and  this usually causes an energy increase. The nurse administering the transfusion will monitor you carefully for complications. HOME CARE INSTRUCTIONS  No special instructions are needed after a transfusion. You may find your energy is better. Speak with your caregiver about any limitations on activity for underlying diseases you may have. SEEK MEDICAL CARE IF:  Your condition is not improving after your transfusion. You develop redness or irritation at the intravenous (IV) site. SEEK IMMEDIATE MEDICAL CARE IF:  Any of the following symptoms occur over the next 12 hours: Shaking chills. You have a temperature by mouth above 102 F (38.9 C), not controlled by medicine. Chest, back, or muscle pain. People around you feel you are not acting correctly or are confused. Shortness of breath or difficulty breathing. Dizziness and fainting. You get a rash or develop hives. You have a decrease in urine output. Your urine turns a dark color or changes to pink, red, or brown. Any of the following symptoms occur over the next 10 days: You have a temperature by mouth above 102 F (38.9 C), not controlled by medicine. Shortness of breath. Weakness after normal activity. The white part of the eye turns yellow (jaundice). You have a decrease in the amount of urine or are urinating less often. Your urine turns a dark color or changes to pink, red, or brown. Document Released: 07/06/2000 Document Revised: 10/01/2011 Document Reviewed: 02/23/2008 [facility] Patient Information 2014 [place], [place].  _______________________________________________________________________

## 2023-11-07 NOTE — Progress Notes (Signed)
 Anesthesia Chart Review   Case: [id number] Date/Time: 11/15/23 1230   Procedure: ARTHROPLASTY, HIP, TOTAL, ANTERIOR APPROACH (Left: Hip)   Anesthesia type: Spinal   Diagnosis: Primary osteoarthritis of left hip [M16.12]   Pre-op diagnosis: osteoarthritis left hip   Location: [facility] ROOM 09 / [facility]   Surgeons: Kathryne Hitch, MD       DISCUSSION:89 y.o. never smoker with h/o paroxysmal atrial fibrillation, stroke, thrombocytopenia, chronic ITP, left hip OA scheduled for above procedure 11/15/2023 with Dr. Doneen Poisson.   Pt reports last dose of Eliquis 11/11/2023.   Pt last seen by cardiology 06/24/2023. Stable at this visit.   Platelets 91, pt with chronic thrombocytopenia, followed by hem/onc.   VS: BP (!) 154/80   Pulse (!) 23   Temp 36.9 C (Oral)   Resp 16   Ht 6' (1.829 m)   SpO2 100%   BMI 25.50 kg/m   PROVIDERS: Pincus Sanes, MD is PCP    LABS: Labs reviewed: Acceptable for surgery. (all labs ordered are listed, but only abnormal results are displayed)  Labs Reviewed  CBC - Abnormal; Notable for the following components:      Result Value   RBC 4.16 (*)    Platelets 91 (*)    All other components within normal limits  SURGICAL PCR SCREEN  BASIC METABOLIC PANEL WITH GFR  TYPE AND SCREEN     IMAGES:   EKG:   CV: Echo 08/19/2018 1. The left ventricle appears to be normal in size, has normal wall  thickness 50-55% ejection fraction Spectral Doppler shows indeterminate  pattern of diastolic filling.   2. There is mild hypokinesis of the basiliar-mid anteroseptal left  ventricular segment.   3. Septal hypokinesis is likely due to LBBB.   4. Right ventricular systolic pressure is is normal.   5. The right ventricle has normal size and normal systolic function.   6. Normal left atrial size.   7. Normal right atrial size.   8. Mitral valve regurgitation is trivial by color flow Doppler.   9. The mitral valve normal in structure and  function.  10. Normal tricuspid valve.  11. Aortic valve normal.  12. No atrial level shunt detected by color flow Doppler.  Past Medical History:  Diagnosis Date   Arthritis    Atrial fibrillation (HCC)    Colon polyps    COVID-19    Diverticulosis    Gout    Hemorrhoids    Hyperlipemia    Nephrolithiasis    Pneumonia    Prostate cancer (HCC)    Stroke [facility])     Past Surgical History:  Procedure Laterality Date   CATARACT EXTRACTION W/ INTRAOCULAR LENS  IMPLANT, BILATERAL     bi-lat   CERVICAL SPINE SURGERY     COLONOSCOPY W/ POLYPECTOMY     Epidural steroid injections     3-4, Dr Danielle Dess   RETROPUBIC PROSTATECTOMY     SHOULDER SURGERY     left    MEDICATIONS:  acetaminophen (TYLENOL) 325 MG tablet   apixaban (ELIQUIS) 5 MG TABS tablet   atorvastatin (LIPITOR) 40 MG tablet   Cholecalciferol (VITAMIN D3 PO)   diltiazem (CARDIZEM) 30 MG tablet   Multiple Vitamins-Minerals (ADULT ONE DAILY GUMMIES) CHEW   triamcinolone cream (KENALOG) 0.1 %   No current facility-administered medications for this encounter.     Jodell Cipro Ward, PA-C [facility]-Surgical Testing [phone]

## 2023-11-07 NOTE — Anesthesia Preprocedure Evaluation (Addendum)
 Anesthesia Evaluation  Patient identified by MRN, date of birth, ID band Patient awake    Reviewed: Allergy & Precautions, NPO status , Patient's Chart, lab work & pertinent test results, reviewed documented beta blocker date and time   History of Anesthesia Complications Negative for: history of anesthetic complications  Airway Mallampati: III  TM Distance: >3 FB     Dental no notable dental hx.    Pulmonary neg shortness of breath, pneumonia, neg recent URI   breath sounds clear to auscultation       Cardiovascular (-) angina (-) CAD, (-) Past MI and (-) Cardiac Stents + dysrhythmias Atrial Fibrillation  Rhythm:Regular Rate:Normal  Last dose eliquis  4/21   Neuro/Psych neg Seizures  Neuromuscular disease CVA, No Residual Symptoms    GI/Hepatic ,neg GERD  ,,(+) neg Cirrhosis        Endo/Other  neg diabetes    Renal/GU Renal disease     Musculoskeletal  (+) Arthritis , Osteoarthritis,    Abdominal   Peds  Hematology  (+) Blood dyscrasia Chronic ITP   Anesthesia Other Findings   Reproductive/Obstetrics                             Anesthesia Physical Anesthesia Plan  ASA: 3  Anesthesia Plan: Spinal   Post-op Pain Management:    Induction: Intravenous  PONV Risk Score and Plan: 1 and Ondansetron  and Propofol  infusion  Airway Management Planned: Simple Face Mask and Natural Airway  Additional Equipment:   Intra-op Plan:   Post-operative Plan:   Informed Consent: I have reviewed the patients History and Physical, chart, labs and discussed the procedure including the risks, benefits and alternatives for the proposed anesthesia with the patient or authorized representative who has indicated his/her understanding and acceptance.     Dental advisory given  Plan Discussed with:   Anesthesia Plan Comments: (See PAT note 11/04/2023  Discussed risks/benefits of SAB in setting of  chronic thrombocytopenia/ITP. Patient and family report understanding and agree to proceed with SAB. )       Anesthesia Quick Evaluation

## 2023-11-12 ENCOUNTER — Encounter: Payer: Self-pay | Admitting: Orthopaedic Surgery

## 2023-11-13 DIAGNOSIS — Z961 Presence of intraocular lens: Secondary | ICD-10-CM | POA: Diagnosis not present

## 2023-11-13 DIAGNOSIS — H52203 Unspecified astigmatism, bilateral: Secondary | ICD-10-CM | POA: Diagnosis not present

## 2023-11-14 ENCOUNTER — Telehealth: Payer: Self-pay | Admitting: *Deleted

## 2023-11-14 NOTE — H&P (Signed)
 TOTAL HIP ADMISSION H&P  Patient is admitted for left total hip arthroplasty.  Subjective:  Chief Complaint: left hip pain  HPI: Chad Norris, 89 y.o. male, has a history of pain and functional disability in the left hip(s) due to arthritis and patient has failed non-surgical conservative treatments for greater than 12 weeks to include corticosteriod injections, flexibility and strengthening excercises, use of assistive devices, and activity modification.  Onset of symptoms was gradual starting several years ago with gradually worsening course since that time.The patient noted no past surgery on the left hip(s).  Patient currently rates pain in the left hip at 10 out of 10 with activity. Patient has night pain, worsening of pain with activity and weight bearing, trendelenberg gait, pain that interfers with activities of daily living, and pain with passive range of motion. Patient has evidence of subchondral cysts, subchondral sclerosis, periarticular osteophytes, and joint space narrowing by imaging studies. This condition presents safety issues increasing the risk of falls.  There is no current active infection.  Patient Active Problem List   Diagnosis Date Noted   PAF (paroxysmal atrial fibrillation) (HCC) 01/15/2023   Unilateral primary osteoarthritis, left hip 10/31/2022   Trochanteric bursitis, left hip 10/31/2022   Insect bite of left lower leg 10/29/2022   Tick bite of left lower leg 10/29/2022   Pain of left lower extremity 10/29/2022   Cellulitis of left lower leg 10/29/2022   Urine frequency 06/06/2022   Thrombocytopenia (HCC) 06/06/2021   Overactive bladder 06/05/2021   Skin lesions 01/25/2021   Nephrolithiasis    COVID-19    Cervical radiculopathy 06/11/2019   Sensorineural hearing loss, bilateral 05/21/2019   History of CVA (cerebrovascular accident) 08/18/2018   Hyperlipidemia 03/27/2018   Basal cell carcinoma (BCC) of [place] 03/11/2017   Prediabetes 03/11/2017    DDD (degenerative disc disease), lumbosacral 01/13/2014   PROSTATE CANCER, HX OF 06/21/2008   History of colonic polyps 06/21/2008   NEPHROLITHIASIS, HX OF 05/07/2008   History of gout 10/09/2007   Past Medical History:  Diagnosis Date   Arthritis    Atrial fibrillation (HCC)    Colon polyps    COVID-19    Diverticulosis    Gout    Hemorrhoids    Hyperlipemia    Nephrolithiasis    Pneumonia    Prostate cancer (HCC)    Stroke (HCC)     Past Surgical History:  Procedure Laterality Date   CATARACT EXTRACTION W/ INTRAOCULAR LENS  IMPLANT, BILATERAL     bi-lat   CERVICAL SPINE SURGERY     COLONOSCOPY W/ POLYPECTOMY     Epidural steroid injections     3-4, Dr Ellery Guthrie   RETROPUBIC PROSTATECTOMY     SHOULDER SURGERY     left    No current facility-administered medications for this encounter.   Current Outpatient Medications  Medication Sig Dispense Refill Last Dose/Taking   acetaminophen  (TYLENOL ) 325 MG tablet Take 2 tablets (650 mg total) by mouth every 6 (six) hours as needed for moderate pain. 30 tablet 0 Taking As Needed   apixaban  (ELIQUIS ) 5 MG TABS tablet Take 1 tablet (5 mg total) by mouth 2 (two) times daily. 180 tablet 3 Taking   atorvastatin  (LIPITOR) 40 MG tablet TAKE 1 TABLET(40 MG) BY MOUTH DAILY (Patient taking differently: Take 40 mg by mouth at bedtime. TAKE 1 TABLET(40 MG) BY MOUTH DAILY) 90 tablet 0 Taking Differently   Cholecalciferol (VITAMIN D3 PO) Take 1 tablet by mouth in the morning.  Taking   diltiazem  (CARDIZEM ) 30 MG tablet Take 1 tablet (30 mg total) by mouth every 6 (six) hours as needed (for heart rate greater than 100 bpm.). 60 tablet 3 Taking As Needed   Multiple Vitamins-Minerals (ADULT ONE DAILY GUMMIES) CHEW Chew 2 tablets by mouth in the morning. Vitafusion Adult Gummy Vitamins for Men   Taking   triamcinolone  cream (KENALOG ) 0.1 % APPLY TOPICALLY TO THE AFFECTED AREA TWICE DAILY (Patient taking differently: Apply 1 Application topically 2  (two) times daily as needed (skin irritation/rash).) 30 g 0    No Known Allergies  Social History   Tobacco Use   Smoking status: Never   Smokeless tobacco: Never  Substance Use Topics   Alcohol use: Not Currently    Alcohol/week: 1.0 standard drink of alcohol    Types: 1 Cans of beer per week    Family History  Problem Relation Age of Onset   Kidney cancer Father    Colon cancer Father    Cancer Brother        ?   Heart attack Brother 55   Heart attack Sister 63   Breast cancer Sister    Diabetes Brother    Alcohol abuse Brother    Colon polyps Brother      Review of Systems  Objective:  Physical Exam Vitals reviewed.  Constitutional:      Appearance: Normal appearance. He is normal weight.  HENT:     Head: Normocephalic.  Eyes:     Extraocular Movements: Extraocular movements intact.     Pupils: Pupils are equal, round, and reactive to light.  Cardiovascular:     Rate and Rhythm: Normal rate.  Pulmonary:     Effort: Pulmonary effort is normal.     Breath sounds: Normal breath sounds.  Abdominal:     Palpations: Abdomen is soft.  Musculoskeletal:     Cervical back: Normal range of motion.     Left hip: Tenderness and bony tenderness present. Decreased range of motion. Decreased strength.  Neurological:     Mental Status: He is alert and oriented to person, place, and time.  Psychiatric:        Behavior: Behavior normal.     Vital signs in last 24 hours:    Labs:   Estimated body mass index is 25.5 kg/m as calculated from the following:   Height as of 11/04/23: 6' (1.829 m).   Weight as of 09/10/23: 85.3 kg.   Imaging Review Plain radiographs demonstrate severe degenerative joint disease of the left hip(s). The bone quality appears to be good for age and reported activity level.      Assessment/Plan:  End stage arthritis, left hip(s)  The patient history, physical examination, clinical judgement of the provider and imaging studies are  consistent with end stage degenerative joint disease of the left hip(s) and total hip arthroplasty is deemed medically necessary. The treatment options including medical management, injection therapy, arthroscopy and arthroplasty were discussed at length. The risks and benefits of total hip arthroplasty were presented and reviewed. The risks due to aseptic loosening, infection, stiffness, dislocation/subluxation,  thromboembolic complications and other imponderables were discussed.  The patient acknowledged the explanation, agreed to proceed with the plan and consent was signed. Patient is being admitted for inpatient treatment for surgery, pain control, PT, OT, prophylactic antibiotics, VTE prophylaxis, progressive ambulation and ADL's and discharge planning.The patient is planning to be discharged home with home health services

## 2023-11-14 NOTE — Telephone Encounter (Signed)
 Ortho pre-op call completed.

## 2023-11-14 NOTE — Care Plan (Signed)
 [facility] RNCM call to patient to discuss his upcoming Left total hip arthroplasty with Dr. Lucienne Ryder on 11/15/23 at [facility] He is agreeable to case management. He lives with his spouse and his plan is to return home with assistance from her and his son after discharge. He has a RW already. Anticipate HHPT will be needed after a short hospital stay. Referral made to [facility] after choice provided. Reviewed post op care instructions. Will continue to follow for needs.

## 2023-11-15 ENCOUNTER — Ambulatory Visit (HOSPITAL_COMMUNITY): Admitting: Anesthesiology

## 2023-11-15 ENCOUNTER — Observation Stay (HOSPITAL_COMMUNITY)

## 2023-11-15 ENCOUNTER — Ambulatory Visit (HOSPITAL_COMMUNITY)

## 2023-11-15 ENCOUNTER — Inpatient Hospital Stay (HOSPITAL_COMMUNITY)
Admission: RE | Admit: 2023-11-15 | Discharge: 2023-11-22 | DRG: 470 | Disposition: A | Discharge status: Skilled Nursing Facility | Attending: Orthopaedic Surgery | Admitting: Orthopaedic Surgery

## 2023-11-15 ENCOUNTER — Other Ambulatory Visit: Payer: Self-pay

## 2023-11-15 ENCOUNTER — Ambulatory Visit (HOSPITAL_COMMUNITY): Admitting: Physician Assistant

## 2023-11-15 ENCOUNTER — Encounter (HOSPITAL_COMMUNITY)
Admission: RE | Disposition: A | Discharge status: Skilled Nursing Facility | Payer: Self-pay | Source: Home / Self Care | Attending: Orthopaedic Surgery

## 2023-11-15 ENCOUNTER — Encounter (HOSPITAL_COMMUNITY): Payer: Self-pay | Admitting: Orthopaedic Surgery

## 2023-11-15 DIAGNOSIS — E860 Dehydration: Secondary | ICD-10-CM | POA: Diagnosis not present

## 2023-11-15 DIAGNOSIS — I4891 Unspecified atrial fibrillation: Secondary | ICD-10-CM | POA: Diagnosis not present

## 2023-11-15 DIAGNOSIS — Z860100 Personal history of colon polyps, unspecified: Secondary | ICD-10-CM | POA: Diagnosis not present

## 2023-11-15 DIAGNOSIS — Z809 Family history of malignant neoplasm, unspecified: Secondary | ICD-10-CM

## 2023-11-15 DIAGNOSIS — Z8249 Family history of ischemic heart disease and other diseases of the circulatory system: Secondary | ICD-10-CM

## 2023-11-15 DIAGNOSIS — M1611 Unilateral primary osteoarthritis, right hip: Secondary | ICD-10-CM | POA: Diagnosis not present

## 2023-11-15 DIAGNOSIS — E861 Hypovolemia: Secondary | ICD-10-CM | POA: Diagnosis not present

## 2023-11-15 DIAGNOSIS — J189 Pneumonia, unspecified organism: Secondary | ICD-10-CM | POA: Diagnosis not present

## 2023-11-15 DIAGNOSIS — Z833 Family history of diabetes mellitus: Secondary | ICD-10-CM

## 2023-11-15 DIAGNOSIS — Z96642 Presence of left artificial hip joint: Secondary | ICD-10-CM | POA: Diagnosis not present

## 2023-11-15 DIAGNOSIS — R7303 Prediabetes: Secondary | ICD-10-CM | POA: Diagnosis not present

## 2023-11-15 DIAGNOSIS — I639 Cerebral infarction, unspecified: Secondary | ICD-10-CM | POA: Diagnosis not present

## 2023-11-15 DIAGNOSIS — H903 Sensorineural hearing loss, bilateral: Secondary | ICD-10-CM | POA: Diagnosis not present

## 2023-11-15 DIAGNOSIS — Z85828 Personal history of other malignant neoplasm of skin: Secondary | ICD-10-CM

## 2023-11-15 DIAGNOSIS — Z83719 Family history of colon polyps, unspecified: Secondary | ICD-10-CM

## 2023-11-15 DIAGNOSIS — Z811 Family history of alcohol abuse and dependence: Secondary | ICD-10-CM

## 2023-11-15 DIAGNOSIS — N179 Acute kidney failure, unspecified: Secondary | ICD-10-CM | POA: Diagnosis present

## 2023-11-15 DIAGNOSIS — I447 Left bundle-branch block, unspecified: Secondary | ICD-10-CM | POA: Diagnosis not present

## 2023-11-15 DIAGNOSIS — Z7901 Long term (current) use of anticoagulants: Secondary | ICD-10-CM

## 2023-11-15 DIAGNOSIS — I480 Paroxysmal atrial fibrillation: Secondary | ICD-10-CM | POA: Diagnosis not present

## 2023-11-15 DIAGNOSIS — Z8673 Personal history of transient ischemic attack (TIA), and cerebral infarction without residual deficits: Secondary | ICD-10-CM

## 2023-11-15 DIAGNOSIS — I9581 Postprocedural hypotension: Secondary | ICD-10-CM | POA: Insufficient documentation

## 2023-11-15 DIAGNOSIS — Z800 Family history of malignant neoplasm of digestive organs: Secondary | ICD-10-CM

## 2023-11-15 DIAGNOSIS — D693 Immune thrombocytopenic purpura: Secondary | ICD-10-CM | POA: Diagnosis present

## 2023-11-15 DIAGNOSIS — I5189 Other ill-defined heart diseases: Secondary | ICD-10-CM | POA: Diagnosis not present

## 2023-11-15 DIAGNOSIS — E871 Hypo-osmolality and hyponatremia: Secondary | ICD-10-CM | POA: Diagnosis not present

## 2023-11-15 DIAGNOSIS — R509 Fever, unspecified: Secondary | ICD-10-CM | POA: Diagnosis not present

## 2023-11-15 DIAGNOSIS — N4889 Other specified disorders of penis: Secondary | ICD-10-CM | POA: Diagnosis not present

## 2023-11-15 DIAGNOSIS — M1612 Unilateral primary osteoarthritis, left hip: Principal | ICD-10-CM | POA: Diagnosis present

## 2023-11-15 DIAGNOSIS — J9811 Atelectasis: Secondary | ICD-10-CM | POA: Diagnosis not present

## 2023-11-15 DIAGNOSIS — D696 Thrombocytopenia, unspecified: Secondary | ICD-10-CM | POA: Diagnosis present

## 2023-11-15 DIAGNOSIS — Z8546 Personal history of malignant neoplasm of prostate: Secondary | ICD-10-CM

## 2023-11-15 DIAGNOSIS — D72829 Elevated white blood cell count, unspecified: Secondary | ICD-10-CM | POA: Diagnosis not present

## 2023-11-15 DIAGNOSIS — E785 Hyperlipidemia, unspecified: Secondary | ICD-10-CM | POA: Diagnosis present

## 2023-11-15 DIAGNOSIS — Z803 Family history of malignant neoplasm of breast: Secondary | ICD-10-CM

## 2023-11-15 DIAGNOSIS — I959 Hypotension, unspecified: Secondary | ICD-10-CM | POA: Diagnosis not present

## 2023-11-15 DIAGNOSIS — Z8616 Personal history of COVID-19: Secondary | ICD-10-CM

## 2023-11-15 DIAGNOSIS — D62 Acute posthemorrhagic anemia: Secondary | ICD-10-CM | POA: Diagnosis not present

## 2023-11-15 DIAGNOSIS — Z79899 Other long term (current) drug therapy: Secondary | ICD-10-CM | POA: Diagnosis not present

## 2023-11-15 DIAGNOSIS — Z8051 Family history of malignant neoplasm of kidney: Secondary | ICD-10-CM

## 2023-11-15 HISTORY — PX: TOTAL HIP ARTHROPLASTY: SHX124

## 2023-11-15 LAB — ABO/RH: ABO/RH(D): O POS

## 2023-11-15 SURGERY — ARTHROPLASTY, HIP, TOTAL, ANTERIOR APPROACH
Anesthesia: Spinal | Site: Hip | Laterality: Left

## 2023-11-15 MED ORDER — ONDANSETRON HCL 4 MG PO TABS: 4.0000 mg | ORAL_TABLET | Freq: Four times a day (QID) | ORAL | Status: DC | PRN

## 2023-11-15 MED ORDER — CEFAZOLIN SODIUM-DEXTROSE 2-4 GM/100ML-% IV SOLN
2.0000 g | Freq: Four times a day (QID) | INTRAVENOUS | Status: AC
  Administered 2023-11-15 (×2): 2 g via INTRAVENOUS
  Filled 2023-11-15 (×2): qty 100

## 2023-11-15 MED ORDER — PROPOFOL 10 MG/ML IV BOLUS
INTRAVENOUS | Status: AC
  Filled 2023-11-15: qty 20

## 2023-11-15 MED ORDER — 0.9 % SODIUM CHLORIDE (POUR BTL) OPTIME
TOPICAL | Status: DC | PRN
  Administered 2023-11-15: 1000 mL

## 2023-11-15 MED ORDER — DEXAMETHASONE SODIUM PHOSPHATE 10 MG/ML IJ SOLN
INTRAMUSCULAR | Status: AC
  Filled 2023-11-15: qty 1

## 2023-11-15 MED ORDER — ONDANSETRON HCL 4 MG/2ML IJ SOLN
INTRAMUSCULAR | Status: AC
  Filled 2023-11-15: qty 2

## 2023-11-15 MED ORDER — GLYCOPYRROLATE 0.2 MG/ML IJ SOLN
INTRAMUSCULAR | Status: DC | PRN
Start: 2023-11-15 — End: 2023-11-15
  Administered 2023-11-15: .2 mg via INTRAVENOUS

## 2023-11-15 MED ORDER — GLYCOPYRROLATE 0.2 MG/ML IJ SOLN
INTRAMUSCULAR | Status: AC
  Filled 2023-11-15: qty 1

## 2023-11-15 MED ORDER — PROPOFOL 10 MG/ML IV BOLUS
INTRAVENOUS | Status: DC | PRN
  Administered 2023-11-15: 10 mg via INTRAVENOUS

## 2023-11-15 MED ORDER — CHLORHEXIDINE GLUCONATE 0.12 % MT SOLN
15.0000 mL | Freq: Once | OROMUCOSAL | Status: AC
  Administered 2023-11-15: 15 mL via OROMUCOSAL

## 2023-11-15 MED ORDER — PROPOFOL 500 MG/50ML IV EMUL
INTRAVENOUS | Status: DC | PRN
  Administered 2023-11-15: 25 ug/kg/min via INTRAVENOUS

## 2023-11-15 MED ORDER — ACETAMINOPHEN 325 MG PO TABS
325.0000 mg | ORAL_TABLET | Freq: Four times a day (QID) | ORAL | Status: DC | PRN
  Administered 2023-11-17 – 2023-11-21 (×4): 650 mg via ORAL
  Filled 2023-11-15 (×5): qty 2

## 2023-11-15 MED ORDER — LACTATED RINGERS IV SOLN: INTRAVENOUS | Status: DC

## 2023-11-15 MED ORDER — DEXMEDETOMIDINE HCL IN NACL 80 MCG/20ML IV SOLN
INTRAVENOUS | Status: DC | PRN
  Administered 2023-11-15 (×2): 4 ug via INTRAVENOUS

## 2023-11-15 MED ORDER — ALUM & MAG HYDROXIDE-SIMETH 200-200-20 MG/5ML PO SUSP
30.0000 mL | ORAL | Status: DC | PRN
  Administered 2023-11-17 – 2023-11-18 (×3): 30 mL via ORAL
  Filled 2023-11-15 (×3): qty 30

## 2023-11-15 MED ORDER — METOCLOPRAMIDE HCL 5 MG/ML IJ SOLN: 5.0000 mg | Freq: Three times a day (TID) | INTRAMUSCULAR | Status: DC | PRN

## 2023-11-15 MED ORDER — FENTANYL CITRATE PF 50 MCG/ML IJ SOSY: 25.0000 ug | PREFILLED_SYRINGE | INTRAMUSCULAR | Status: DC | PRN

## 2023-11-15 MED ORDER — METHOCARBAMOL 1000 MG/10ML IJ SOLN
500.0000 mg | Freq: Four times a day (QID) | INTRAMUSCULAR | Status: DC | PRN
  Administered 2023-11-18: 500 mg via INTRAVENOUS
  Filled 2023-11-15: qty 10

## 2023-11-15 MED ORDER — HYDROCODONE-ACETAMINOPHEN 7.5-325 MG PO TABS
1.0000 | ORAL_TABLET | ORAL | Status: DC | PRN
  Administered 2023-11-15 – 2023-11-16 (×2): 1 via ORAL
  Administered 2023-11-17: 2 via ORAL
  Administered 2023-11-17 – 2023-11-20 (×3): 1 via ORAL
  Filled 2023-11-15 (×3): qty 1
  Filled 2023-11-15: qty 2
  Filled 2023-11-15 (×2): qty 1

## 2023-11-15 MED ORDER — DOCUSATE SODIUM 100 MG PO CAPS
100.0000 mg | ORAL_CAPSULE | Freq: Two times a day (BID) | ORAL | Status: DC
  Administered 2023-11-15 – 2023-11-22 (×14): 100 mg via ORAL
  Filled 2023-11-15 (×13): qty 1

## 2023-11-15 MED ORDER — SODIUM CHLORIDE 0.9 % IV SOLN: INTRAVENOUS | Status: DC

## 2023-11-15 MED ORDER — ONDANSETRON HCL 4 MG/2ML IJ SOLN
4.0000 mg | Freq: Four times a day (QID) | INTRAMUSCULAR | Status: DC | PRN
  Administered 2023-11-19: 4 mg via INTRAVENOUS
  Filled 2023-11-15: qty 2

## 2023-11-15 MED ORDER — OXYCODONE HCL 5 MG PO TABS: 5.0000 mg | ORAL_TABLET | Freq: Once | ORAL | Status: DC | PRN

## 2023-11-15 MED ORDER — DEXAMETHASONE SODIUM PHOSPHATE 10 MG/ML IJ SOLN
INTRAMUSCULAR | Status: DC | PRN
  Administered 2023-11-15: 4 mg via INTRAVENOUS

## 2023-11-15 MED ORDER — CEFAZOLIN SODIUM-DEXTROSE 2-4 GM/100ML-% IV SOLN
2.0000 g | INTRAVENOUS | Status: AC
  Administered 2023-11-15: 2 g via INTRAVENOUS
  Filled 2023-11-15: qty 100

## 2023-11-15 MED ORDER — LIDOCAINE HCL (CARDIAC) PF 100 MG/5ML IV SOSY
PREFILLED_SYRINGE | INTRAVENOUS | Status: DC | PRN
  Administered 2023-11-15: 30 mg via INTRAVENOUS

## 2023-11-15 MED ORDER — ONDANSETRON HCL 4 MG/2ML IJ SOLN: 4.0000 mg | Freq: Once | INTRAMUSCULAR | Status: DC | PRN

## 2023-11-15 MED ORDER — OXYCODONE HCL 5 MG/5ML PO SOLN: 5.0000 mg | Freq: Once | ORAL | Status: DC | PRN

## 2023-11-15 MED ORDER — FENTANYL CITRATE (PF) 100 MCG/2ML IJ SOLN
INTRAMUSCULAR | Status: DC | PRN
  Administered 2023-11-15 (×3): 25 ug via INTRAVENOUS

## 2023-11-15 MED ORDER — METHOCARBAMOL 500 MG PO TABS
500.0000 mg | ORAL_TABLET | Freq: Four times a day (QID) | ORAL | Status: DC | PRN
  Administered 2023-11-15 – 2023-11-18 (×6): 500 mg via ORAL
  Filled 2023-11-15 (×6): qty 1

## 2023-11-15 MED ORDER — EPHEDRINE SULFATE (PRESSORS) 50 MG/ML IJ SOLN
INTRAMUSCULAR | Status: DC | PRN
  Administered 2023-11-15 (×5): 5 mg via INTRAVENOUS

## 2023-11-15 MED ORDER — POVIDONE-IODINE 10 % EX SWAB: 2.0000 | Freq: Once | CUTANEOUS | Status: DC

## 2023-11-15 MED ORDER — PHENOL 1.4 % MT LIQD: 1.0000 | OROMUCOSAL | Status: DC | PRN

## 2023-11-15 MED ORDER — LIDOCAINE HCL (PF) 2 % IJ SOLN
INTRAMUSCULAR | Status: AC
  Filled 2023-11-15: qty 5

## 2023-11-15 MED ORDER — ORAL CARE MOUTH RINSE: 15.0000 mL | Freq: Once | OROMUCOSAL | Status: AC

## 2023-11-15 MED ORDER — BUPIVACAINE IN DEXTROSE 0.75-8.25 % IT SOLN
INTRATHECAL | Status: DC | PRN
  Administered 2023-11-15: 1.8 mL via INTRATHECAL

## 2023-11-15 MED ORDER — ACETAMINOPHEN 10 MG/ML IV SOLN: 1000.0000 mg | Freq: Once | INTRAVENOUS | Status: DC | PRN

## 2023-11-15 MED ORDER — HYDROCODONE-ACETAMINOPHEN 5-325 MG PO TABS
1.0000 | ORAL_TABLET | ORAL | Status: DC | PRN
  Administered 2023-11-15: 1 via ORAL
  Administered 2023-11-16 – 2023-11-20 (×8): 2 via ORAL
  Administered 2023-11-20 – 2023-11-22 (×2): 1 via ORAL
  Filled 2023-11-15 (×5): qty 2
  Filled 2023-11-15 (×2): qty 1
  Filled 2023-11-15 (×2): qty 2
  Filled 2023-11-15 (×3): qty 1

## 2023-11-15 MED ORDER — METOCLOPRAMIDE HCL 5 MG PO TABS: 5.0000 mg | ORAL_TABLET | Freq: Three times a day (TID) | ORAL | Status: DC | PRN

## 2023-11-15 MED ORDER — ONDANSETRON HCL 4 MG/2ML IJ SOLN
INTRAMUSCULAR | Status: DC | PRN
  Administered 2023-11-15: 4 mg via INTRAVENOUS

## 2023-11-15 MED ORDER — MORPHINE SULFATE (PF) 2 MG/ML IV SOLN
0.5000 mg | INTRAVENOUS | Status: DC | PRN
  Administered 2023-11-18: 1 mg via INTRAVENOUS
  Filled 2023-11-15 (×2): qty 1

## 2023-11-15 MED ORDER — PROPOFOL 1000 MG/100ML IV EMUL
INTRAVENOUS | Status: AC
  Filled 2023-11-15: qty 100

## 2023-11-15 MED ORDER — TRANEXAMIC ACID-NACL 1000-0.7 MG/100ML-% IV SOLN
1000.0000 mg | INTRAVENOUS | Status: AC
  Administered 2023-11-15: 1000 mg via INTRAVENOUS
  Filled 2023-11-15: qty 100

## 2023-11-15 MED ORDER — PANTOPRAZOLE SODIUM 40 MG PO TBEC
40.0000 mg | DELAYED_RELEASE_TABLET | Freq: Every day | ORAL | Status: DC
  Administered 2023-11-17 – 2023-11-22 (×6): 40 mg via ORAL
  Filled 2023-11-15 (×6): qty 1

## 2023-11-15 MED ORDER — EPHEDRINE 5 MG/ML INJ
INTRAVENOUS | Status: AC
  Filled 2023-11-15: qty 5

## 2023-11-15 MED ORDER — ALBUMIN HUMAN 5 % IV SOLN
INTRAVENOUS | Status: AC
  Filled 2023-11-15: qty 250

## 2023-11-15 MED ORDER — SODIUM CHLORIDE 0.9 % IR SOLN
Status: DC | PRN
  Administered 2023-11-15: 1000 mL

## 2023-11-15 MED ORDER — APIXABAN 5 MG PO TABS
5.0000 mg | ORAL_TABLET | Freq: Two times a day (BID) | ORAL | Status: DC
Start: 2023-11-16 — End: 2023-11-22
  Administered 2023-11-16 – 2023-11-22 (×13): 5 mg via ORAL
  Filled 2023-11-15 (×13): qty 1

## 2023-11-15 MED ORDER — FENTANYL CITRATE (PF) 100 MCG/2ML IJ SOLN
INTRAMUSCULAR | Status: AC
  Filled 2023-11-15: qty 2

## 2023-11-15 MED ORDER — MENTHOL 3 MG MT LOZG: 1.0000 | LOZENGE | OROMUCOSAL | Status: DC | PRN

## 2023-11-15 MED ORDER — DILTIAZEM HCL 30 MG PO TABS: 30.0000 mg | ORAL_TABLET | Freq: Four times a day (QID) | ORAL | Status: DC | PRN

## 2023-11-15 MED ORDER — PHENYLEPHRINE HCL-NACL 20-0.9 MG/250ML-% IV SOLN
INTRAVENOUS | Status: DC | PRN
  Administered 2023-11-15: 30 ug/min via INTRAVENOUS

## 2023-11-15 MED ORDER — DIPHENHYDRAMINE HCL 12.5 MG/5ML PO ELIX: 12.5000 mg | ORAL_SOLUTION | ORAL | Status: DC | PRN

## 2023-11-15 SURGICAL SUPPLY — 36 items
BAG COUNTER SPONGE SURGICOUNT (BAG) ×1 IMPLANT
BAG ZIPLOCK 12X15 (MISCELLANEOUS) IMPLANT
BENZOIN TINCTURE PRP APPL 2/3 (GAUZE/BANDAGES/DRESSINGS) IMPLANT
BLADE SAW SGTL 18X1.27X75 (BLADE) ×1 IMPLANT
COVER PERINEAL POST (MISCELLANEOUS) ×1 IMPLANT
COVER SURGICAL LIGHT HANDLE (MISCELLANEOUS) ×1 IMPLANT
CUP SECTOR GRIPTON 58MM (Orthopedic Implant) IMPLANT
DRAPE FOOT SWITCH (DRAPES) ×1 IMPLANT
DRAPE STERI IOBAN 125X83 (DRAPES) ×1 IMPLANT
DRAPE U-SHAPE 47X51 STRL (DRAPES) ×2 IMPLANT
DRSG AQUACEL AG ADV 3.5X10 (GAUZE/BANDAGES/DRESSINGS) ×1 IMPLANT
DURAPREP 26ML APPLICATOR (WOUND CARE) ×1 IMPLANT
ELECT PENCIL ROCKER SW 15FT (MISCELLANEOUS) ×1 IMPLANT
ELECT REM PT RETURN 15FT ADLT (MISCELLANEOUS) ×1 IMPLANT
GAUZE XEROFORM 1X8 LF (GAUZE/BANDAGES/DRESSINGS) IMPLANT
GLOVE BIO SURGEON STRL SZ7.5 (GLOVE) ×1 IMPLANT
GLOVE BIOGEL PI IND STRL 8 (GLOVE) ×2 IMPLANT
GLOVE ECLIPSE 8.0 STRL XLNG CF (GLOVE) ×1 IMPLANT
GOWN STRL REUS W/ TWL XL LVL3 (GOWN DISPOSABLE) ×2 IMPLANT
HEAD M SROM 36MM 2 (Hips) IMPLANT
HOLDER FOLEY CATH W/STRAP (MISCELLANEOUS) ×1 IMPLANT
KIT TURNOVER KIT A (KITS) IMPLANT
LINER NEUTRAL 36X58 PLUS4 IMPLANT
PACK ANTERIOR HIP CUSTOM (KITS) ×1 IMPLANT
SET HNDPC FAN SPRY TIP SCT (DISPOSABLE) ×1 IMPLANT
STAPLER SKIN PROX WIDE 3.9 (STAPLE) IMPLANT
STEM FEMORAL SZ5 HIGH ACTIS (Stem) IMPLANT
STRIP CLOSURE SKIN 1/2X4 (GAUZE/BANDAGES/DRESSINGS) IMPLANT
SUT ETHIBOND NAB CT1 #1 30IN (SUTURE) ×1 IMPLANT
SUT ETHILON 2 0 PS N (SUTURE) IMPLANT
SUT MNCRL AB 4-0 PS2 18 (SUTURE) IMPLANT
SUT VIC AB 0 CT1 36 (SUTURE) ×1 IMPLANT
SUT VIC AB 1 CT1 36 (SUTURE) ×1 IMPLANT
SUT VIC AB 2-0 CT1 TAPERPNT 27 (SUTURE) ×2 IMPLANT
TRAY FOLEY MTR SLVR 16FR STAT (SET/KITS/TRAYS/PACK) IMPLANT
YANKAUER SUCT BULB TIP NO VENT (SUCTIONS) ×1 IMPLANT

## 2023-11-15 NOTE — Interval H&P Note (Signed)
 History and Physical Interval Note: The patient understands that he is here today for a left total hip replacement to treat his significant left hip pain and arthritis.  There has been no acute or interval change in his medical status.  The risks and benefits of surgery have been discussed in detail and informed consent has been obtained.  The left operative hip has been marked.  11/15/2023 8:33 AM  Chad Norris  has presented today for surgery, with the diagnosis of osteoarthritis left hip.  The various methods of treatment have been discussed with the patient and family. After consideration of risks, benefits and other options for treatment, the patient has consented to  Procedure(s): ARTHROPLASTY, HIP, TOTAL, ANTERIOR APPROACH (Left) as a surgical intervention.  The patient's history has been reviewed, patient examined, no change in status, stable for surgery.  I have reviewed the patient's chart and labs.  Questions were answered to the patient's satisfaction.     [name]

## 2023-11-15 NOTE — Evaluation (Signed)
 Physical Therapy Evaluation Patient Details Name: Chad Norris MRN: [id number] DOB: 1931-08-02 Today's Date: 11/15/2023  History of Present Illness  Pt s/p L THR and with hx of afib, CVA and protstate CA  Clinical Impression  Pt s/p L THR and presents with decreased L LE strength/ROM, post op pain, c/o dizziness with mobility and balance deficits.  Pt should progress to dc home with family assist and HHPT follow up.        If plan is discharge home, recommend the following: A little help with walking and/or transfers;A little help with bathing/dressing/bathroom;Assistance with cooking/housework;Assist for transportation;Help with stairs or ramp for entrance   Can travel by private vehicle        Equipment Recommendations Rolling walker (2 wheels)  Recommendations for Other Services       Functional Status Assessment Patient has had a recent decline in their functional status and demonstrates the ability to make significant improvements in function in a reasonable and predictable amount of time.     Precautions / Restrictions Precautions Precautions: Fall Precaution/Restrictions Comments: Dizzy with standing on eval Restrictions Weight Bearing Restrictions Per Provider Order: No LLE Weight Bearing Per Provider Order: Weight bearing as tolerated      Mobility  Bed Mobility Overal bed mobility: Needs Assistance Bed Mobility: Supine to Sit, Sit to Supine     Supine to sit: Min assist, Mod assist, +2 for physical assistance, +2 for safety/equipment Sit to supine: Mod assist   General bed mobility comments: Increased time with cues for sequence and use of R LE to self assist    Transfers Overall transfer level: Needs assistance Equipment used: Rolling walker (2 wheels) Transfers: Sit to/from Stand Sit to Stand: Mod assist           General transfer comment: cues for LE management and use of UEs to self assist;  Physical assist to bring wt up and fwd and to  balance in standing with RW    Ambulation/Gait Ambulation/Gait assistance: Min assist Gait Distance (Feet): 3 Feet Assistive device: Rolling walker (2 wheels) Gait Pattern/deviations: Step-to pattern, Decreased step length - right, Decreased step length - left, Shuffle, Trunk flexed Gait velocity: decr     General Gait Details: Pt side shuffled along EOB only - limited by fatigue and c/o dizziness  Stairs            Wheelchair Mobility     Tilt Bed    Modified Rankin (Stroke Patients Only)       Balance Overall balance assessment: Needs assistance Sitting-balance support: No upper extremity supported, Feet supported Sitting balance-Leahy Scale: Fair     Standing balance support: Bilateral upper extremity supported Standing balance-Leahy Scale: Poor                               Pertinent Vitals/Pain Pain Assessment Pain Assessment: 0-10 Pain Score: 4  Pain Location: L hip Pain Descriptors / Indicators: Aching, Sore Pain Intervention(s): Limited activity within patient's tolerance, Monitored during session, Premedicated before session    Home Living Family/patient expects to be discharged to:: Private residence Living Arrangements: Spouse/significant other Available Help at Discharge: Family Type of Home: House Home Access: Stairs to enter Entrance Stairs-Rails: Right Entrance Stairs-Number of Steps: 3 Alternate Level Stairs-Number of Steps: 14 Home Layout: Two level;Able to live on main level with bedroom/bathroom Home Equipment: Jeananne Mighty - single point Additional Comments: office on second floor    Prior  Function Prior Level of Function : Independent/Modified Independent                     Extremity/Trunk Assessment   Upper Extremity Assessment Upper Extremity Assessment: Overall WFL for tasks assessed    Lower Extremity Assessment Lower Extremity Assessment: LLE deficits/detail       Communication    Communication Communication: Impaired Factors Affecting Communication: Hearing impaired    Cognition Arousal: Alert Behavior During Therapy: WFL for tasks assessed/performed   PT - Cognitive impairments: No apparent impairments                         Following commands: Intact       Cueing Cueing Techniques: Verbal cues, Gestural cues, Tactile cues     General Comments      Exercises Total Joint Exercises Ankle Circles/Pumps: AROM, Both, 15 reps, Supine   Assessment/Plan    PT Assessment Patient needs continued PT services  PT Problem List Decreased strength;Decreased range of motion;Decreased activity tolerance;Decreased balance;Decreased mobility;Decreased knowledge of use of DME;Pain       PT Treatment Interventions DME instruction;Gait training;Stair training;Functional mobility training;Therapeutic activities;Therapeutic exercise;Balance training;Patient/family education    PT Goals (Current goals can be found in the Care Plan section)  Acute Rehab PT Goals Patient Stated Goal: Regain IND PT Goal Formulation: With patient Time For Goal Achievement: 11/28/23 Potential to Achieve Goals: Good    Frequency 7X/week     Co-evaluation               AM-PAC PT "6 Clicks" Mobility  Outcome Measure Help needed turning from your back to your side while in a flat bed without using bedrails?: A Lot Help needed moving from lying on your back to sitting on the side of a flat bed without using bedrails?: A Lot Help needed moving to and from a bed to a chair (including a wheelchair)?: A Lot Help needed standing up from a chair using your arms (e.g., wheelchair or bedside chair)?: A Lot Help needed to walk in hospital room?: Total Help needed climbing 3-5 steps with a railing? : Total 6 Click Score: 10    End of Session Equipment Utilized During Treatment: Gait belt Activity Tolerance: Patient limited by fatigue Patient left: in bed;with call bell/phone  within reach;with family/visitor present;with bed alarm set Nurse Communication: Mobility status PT Visit Diagnosis: Unsteadiness on feet (R26.81);Muscle weakness (generalized) (M62.81);Difficulty in walking, not elsewhere classified (R26.2);Pain Pain - Right/Left: Left Pain - part of body: Hip    Time: 4782-9562 PT Time Calculation (min) (ACUTE ONLY): 26 min   Charges:   PT Evaluation $PT Eval Low Complexity: 1 Low PT Treatments $Therapeutic Activity: 8-22 mins PT General Charges $$ ACUTE PT VISIT: 1 Visit         Thedora Finlay PT Acute Rehabilitation Services Pager [phone] Office [phone]   [facility] 11/15/2023, 5:24 PM

## 2023-11-15 NOTE — Transfer of Care (Signed)
 Immediate Anesthesia Transfer of Care Note  Patient: Chad Norris  Procedure(s) Performed: ARTHROPLASTY, HIP, TOTAL, ANTERIOR APPROACH (Left: Hip)  Patient Location: PACU  Anesthesia Type:Spinal  Level of Consciousness: oriented, drowsy, and patient cooperative  Airway & Oxygen Therapy: Patient Spontanous Breathing and Patient connected to face mask oxygen  Post-op Assessment: Report given to RN and Post -op Vital signs reviewed and stable  Post vital signs: Reviewed and stable  Last Vitals:  Vitals Value Taken Time  BP 87/58 11/15/23 1140  Temp    Pulse 82 11/15/23 1144  Resp 17 11/15/23 1144  SpO2 99 % 11/15/23 1144  Vitals shown include unfiled device data.  Last Pain:  Vitals:   11/15/23 0753  TempSrc:   PainSc: 0-No pain      Patients Stated Pain Goal: 4 (11/15/23 0753)  Complications: No notable events documented.

## 2023-11-15 NOTE — Anesthesia Procedure Notes (Signed)
 Spinal  Patient location during procedure: OR Start time: 11/15/2023 9:55 AM End time: 11/15/2023 10:00 AM Reason for block: surgical anesthesia Staffing Performed: anesthesiologist  Anesthesiologist: Leslye Rast, MD Performed by: Leslye Rast, MD Authorized by: Leslye Rast, MD   Preanesthetic Checklist Completed: patient identified, IV checked, site marked, risks and benefits discussed, surgical consent, monitors and equipment checked, pre-op evaluation and timeout performed Spinal Block Patient position: sitting Prep: Betadine Patient monitoring: heart rate, continuous pulse ox, blood pressure and cardiac monitor Approach: midline Location: L4-5 Injection technique: single-shot Needle Needle type: Sprotte  Needle gauge: 24 G Needle length: 9 cm Assessment Events: CSF return Additional Notes

## 2023-11-15 NOTE — Plan of Care (Signed)
   Problem: Education: Goal: Knowledge of General Education information will improve Description Including pain rating scale, medication(s)/side effects and non-pharmacologic comfort measures Outcome: Progressing   Problem: Health Behavior/Discharge Planning: Goal: Ability to manage health-related needs will improve Outcome: Progressing

## 2023-11-15 NOTE — Op Note (Signed)
 Operative Note  Date of operation: 11/15/2023 Preoperative diagnosis: Left hip primary osteoarthritis Postoperative diagnosis: Same  Procedure: Left direct anterior total hip arthroplasty  Implants: Implant Name Type Inv. Item Serial No. Manufacturer Lot No. LRB No. Used Action  CUP SECTOR GRIPTON - [id number] Orthopedic Implant CUP SECTOR GRIPTON  DEPUY ORTHOPA[employer] [id number] Left 1 Implanted  LINER NEUTRAL 36X58 PLUS4 - [id number]  LINER NEUTRAL 36X58 PLUS4  DEPUY ORTHOPAEDICS [id number] Left 1 Implanted  STEM FEMORAL SZ5 HIGH ACTIS - [id number] Stem STEM FEMORAL SZ5 HIGH ACTIS  DEPUY ORTHOPAEDICS [id number] Left 1 Implanted  HEAD M SROM 2 - [id number] Hips HEAD M SROM 2  DEPUY ORTHOPAEDICS [id number] Left 1 Implanted   Surgeon: Jeanella Milan. Lucienne Ryder, MD Assistant: Malena Scull, PA-C  Anesthesia: Spinal EBL: 350 cc Antibiotics: 2 g IV Ancef  Complications: None  Indications: The patient is a very active 89 year old gentleman with debilitating arthritis involving his left hip that is seen on both x-ray and clinical exam findings.  He has tried and dealt with conservative treatment for over a year now but at this point his left hip pain is daily and is definitely affecting his mobility, his quality of life and his actives daily living to the point he does wish to proceed with a left hip replacement.  We discussed in length in detail the risks of acute loss anemia, nerve vessel injury, fracture, infection, DVT, dislocation, implant failure, leg length differences and wound healing issues.  He understands that our goals are hopefully decreased pain, improved mobility, and improved quality of life.  Procedure description: After informed consent was obtained and the appropriate left hip was marked, the patient was brought to the operating room and set up on the stretcher where spinal anesthesia was obtained.  He was then laid in supine position on the stretcher and a Foley catheter  was placed.  Traction boots were placed on both his feet and next he was placed supine on the Hana fracture table with a perineal post and placed in both legs in inline skeletal traction devices no traction applied.  His left operative hip and pelvis were assessed radiographically.  The left hip was prepped and draped with DuraPrep and sterile drapes.  A timeout was called and he was identified as the correct patient the correct left hip.  An incision was then made just inferior and posterior to the ASIS and carried slightly obliquely down the leg.  Dissection was carried down to the tensor fascia lata muscle and the tensor fascia was then divided longitudinally to proceed with a direct anterior approach to the hip.  Circumflex vessels were identified and cauterized.  The hip capsule identified and opened up in L-type format finding a large joint effusion.  Cobra retractors were placed around the medial and lateral femoral neck and a femoral neck cut was made with an oscillating saw just proximal to the lesser trochanter and this cut was completed with an osteotome.  A corkscrew guide placed in the femoral head and the femoral head was removed in its entirety and there was a wide area devoid of cartilage.  A bent Hohmann was then placed over the medial acetabular rim and remnants of the acetabular labrum and other debris removed.  Reaming was then initiated from a size 43 reamer and stepwise increments going up to a size 57 reamer with all reamers placed under direct visualization and the last reamer placed under direct fluoroscopy.  This allowed us  to obtain the depth  of reaming, the inclination and the anteversion.  The real DePuy sector GRIPTION acetabular clinic size 58 was then placed without difficulty followed by 36+4 polythene liner.  Attention was then turned to the femur.  With the left leg externally rotated to 120 degrees, extended and adducted, a Mueller retractors placed medially and Hohmann retractor  behind the greater trochanter.  The lateral joint capsule was released and a box cutting osteotome was used to enter the femoral canal.  Broaching was then initiated using the Actis broaching system from DePuy going from a size 0 up to a size 5.  With a size 5 in place we trialed a high offset femoral neck but went with a 36-2 head ball given a higher neck cut.  The left leg was brought over and up and with traction and internal rotation reduced in the pelvis.  We assessed it radiographically and clinically and we are pleased with leg length, offset, range of motion and stability.  We then dislocated the hip and remove the trial components.  We then placed the real Actis femoral component with a high offset size 5 followed by a 36-2 metal head ball.  Again this was reduced in the acetabulum we replaced a leg length, offset, range of motion and stability assessed both radiographically and mechanically.  The soft tissue was then irrigated with normal saline solution.  The joint capsule was closed with interrupted #1 Ethibond suture followed by #1 Vicryl close the tensor fascia.  0 Vicryl was used to close deep tissue and 2-0 Vicryl was used to close subcutaneous tissue.  The skin was closed with staples.  Aquacel dressing was applied.  The patient was taken off of the Hana table and taken the recovery room.  Malena Scull, PA-C did assist during the entire case and beginning to end and his assistance was crucial and medically necessary for soft tissue management and retraction, helping guide implant placement and a layered closure of the wound.

## 2023-11-15 NOTE — Anesthesia Postprocedure Evaluation (Signed)
 Anesthesia Post Note  Patient: Chad Norris, Chad Norris  Procedure(s) Performed: ARTHROPLASTY, HIP, TOTAL, ANTERIOR APPROACH (Left: Hip)     Patient location during evaluation: PACU Anesthesia Type: Spinal Level of consciousness: awake and alert Pain management: pain level controlled Vital Signs Assessment: post-procedure vital signs reviewed and stable Respiratory status: spontaneous breathing, nonlabored ventilation, respiratory function stable and patient connected to nasal cannula oxygen Cardiovascular status: blood pressure returned to baseline and stable Postop Assessment: no apparent nausea or vomiting Anesthetic complications: no   No notable events documented.  Last Vitals:  Vitals:   11/15/23 1400 11/15/23 1433  BP: 119/72 111/66  Pulse: 81 79  Resp: 15 18  Temp:  (!) 35.7 C  SpO2: 100% 100%    Last Pain:  Vitals:   11/15/23 1433  TempSrc: Axillary  PainSc:                  Leslye Rast

## 2023-11-16 DIAGNOSIS — R9389 Abnormal findings on diagnostic imaging of other specified body structures: Secondary | ICD-10-CM | POA: Diagnosis not present

## 2023-11-16 DIAGNOSIS — I69398 Other sequelae of cerebral infarction: Secondary | ICD-10-CM | POA: Diagnosis not present

## 2023-11-16 DIAGNOSIS — Z8673 Personal history of transient ischemic attack (TIA), and cerebral infarction without residual deficits: Secondary | ICD-10-CM | POA: Diagnosis not present

## 2023-11-16 DIAGNOSIS — Z7401 Bed confinement status: Secondary | ICD-10-CM | POA: Diagnosis not present

## 2023-11-16 DIAGNOSIS — J9811 Atelectasis: Secondary | ICD-10-CM | POA: Diagnosis not present

## 2023-11-16 DIAGNOSIS — N179 Acute kidney failure, unspecified: Secondary | ICD-10-CM | POA: Diagnosis not present

## 2023-11-16 DIAGNOSIS — E861 Hypovolemia: Secondary | ICD-10-CM | POA: Diagnosis not present

## 2023-11-16 DIAGNOSIS — Z85828 Personal history of other malignant neoplasm of skin: Secondary | ICD-10-CM | POA: Diagnosis not present

## 2023-11-16 DIAGNOSIS — Z96642 Presence of left artificial hip joint: Secondary | ICD-10-CM | POA: Diagnosis not present

## 2023-11-16 DIAGNOSIS — D72829 Elevated white blood cell count, unspecified: Secondary | ICD-10-CM | POA: Diagnosis not present

## 2023-11-16 DIAGNOSIS — D62 Acute posthemorrhagic anemia: Secondary | ICD-10-CM | POA: Diagnosis not present

## 2023-11-16 DIAGNOSIS — G8929 Other chronic pain: Secondary | ICD-10-CM | POA: Diagnosis not present

## 2023-11-16 DIAGNOSIS — I4891 Unspecified atrial fibrillation: Secondary | ICD-10-CM | POA: Diagnosis not present

## 2023-11-16 DIAGNOSIS — M6281 Muscle weakness (generalized): Secondary | ICD-10-CM | POA: Diagnosis not present

## 2023-11-16 DIAGNOSIS — R509 Fever, unspecified: Secondary | ICD-10-CM | POA: Diagnosis not present

## 2023-11-16 DIAGNOSIS — I447 Left bundle-branch block, unspecified: Secondary | ICD-10-CM | POA: Diagnosis not present

## 2023-11-16 DIAGNOSIS — M25559 Pain in unspecified hip: Secondary | ICD-10-CM | POA: Diagnosis not present

## 2023-11-16 DIAGNOSIS — Z743 Need for continuous supervision: Secondary | ICD-10-CM | POA: Diagnosis not present

## 2023-11-16 DIAGNOSIS — I9581 Postprocedural hypotension: Secondary | ICD-10-CM | POA: Diagnosis not present

## 2023-11-16 DIAGNOSIS — R2681 Unsteadiness on feet: Secondary | ICD-10-CM | POA: Diagnosis not present

## 2023-11-16 DIAGNOSIS — I480 Paroxysmal atrial fibrillation: Secondary | ICD-10-CM | POA: Diagnosis not present

## 2023-11-16 DIAGNOSIS — E871 Hypo-osmolality and hyponatremia: Secondary | ICD-10-CM | POA: Diagnosis not present

## 2023-11-16 DIAGNOSIS — Z79899 Other long term (current) drug therapy: Secondary | ICD-10-CM | POA: Diagnosis not present

## 2023-11-16 DIAGNOSIS — N4889 Other specified disorders of penis: Secondary | ICD-10-CM | POA: Diagnosis not present

## 2023-11-16 DIAGNOSIS — M1612 Unilateral primary osteoarthritis, left hip: Secondary | ICD-10-CM | POA: Diagnosis not present

## 2023-11-16 DIAGNOSIS — I69828 Other speech and language deficits following other cerebrovascular disease: Secondary | ICD-10-CM | POA: Diagnosis not present

## 2023-11-16 DIAGNOSIS — Z7901 Long term (current) use of anticoagulants: Secondary | ICD-10-CM | POA: Diagnosis not present

## 2023-11-16 DIAGNOSIS — T8189XA Other complications of procedures, not elsewhere classified, initial encounter: Secondary | ICD-10-CM | POA: Diagnosis not present

## 2023-11-16 DIAGNOSIS — E860 Dehydration: Secondary | ICD-10-CM | POA: Diagnosis not present

## 2023-11-16 DIAGNOSIS — R2689 Other abnormalities of gait and mobility: Secondary | ICD-10-CM | POA: Diagnosis not present

## 2023-11-16 DIAGNOSIS — Z981 Arthrodesis status: Secondary | ICD-10-CM | POA: Diagnosis not present

## 2023-11-16 DIAGNOSIS — H903 Sensorineural hearing loss, bilateral: Secondary | ICD-10-CM | POA: Diagnosis not present

## 2023-11-16 DIAGNOSIS — I959 Hypotension, unspecified: Secondary | ICD-10-CM | POA: Diagnosis not present

## 2023-11-16 DIAGNOSIS — E785 Hyperlipidemia, unspecified: Secondary | ICD-10-CM | POA: Diagnosis not present

## 2023-11-16 DIAGNOSIS — N3281 Overactive bladder: Secondary | ICD-10-CM | POA: Diagnosis not present

## 2023-11-16 DIAGNOSIS — D693 Immune thrombocytopenic purpura: Secondary | ICD-10-CM | POA: Diagnosis not present

## 2023-11-16 DIAGNOSIS — I5189 Other ill-defined heart diseases: Secondary | ICD-10-CM | POA: Diagnosis not present

## 2023-11-16 DIAGNOSIS — M51379 Other intervertebral disc degeneration, lumbosacral region without mention of lumbar back pain or lower extremity pain: Secondary | ICD-10-CM | POA: Diagnosis not present

## 2023-11-16 DIAGNOSIS — Z8546 Personal history of malignant neoplasm of prostate: Secondary | ICD-10-CM | POA: Diagnosis not present

## 2023-11-16 DIAGNOSIS — Z860100 Personal history of colon polyps, unspecified: Secondary | ICD-10-CM | POA: Diagnosis not present

## 2023-11-16 DIAGNOSIS — D696 Thrombocytopenia, unspecified: Secondary | ICD-10-CM | POA: Diagnosis not present

## 2023-11-16 DIAGNOSIS — Z8616 Personal history of COVID-19: Secondary | ICD-10-CM | POA: Diagnosis not present

## 2023-11-16 DIAGNOSIS — Z8249 Family history of ischemic heart disease and other diseases of the circulatory system: Secondary | ICD-10-CM | POA: Diagnosis not present

## 2023-11-16 DIAGNOSIS — M109 Gout, unspecified: Secondary | ICD-10-CM | POA: Diagnosis not present

## 2023-11-16 DIAGNOSIS — R7303 Prediabetes: Secondary | ICD-10-CM | POA: Diagnosis present

## 2023-11-16 DIAGNOSIS — Z471 Aftercare following joint replacement surgery: Secondary | ICD-10-CM | POA: Diagnosis not present

## 2023-11-16 LAB — BASIC METABOLIC PANEL WITH GFR
Anion gap: 11 (ref 5–15)
BUN: 23 mg/dL (ref 8–23)
CO2: 20 mmol/L — ABNORMAL LOW (ref 22–32)
Calcium: 8.2 mg/dL — ABNORMAL LOW (ref 8.9–10.3)
Chloride: 106 mmol/L (ref 98–111)
Creatinine, Ser: 1.1 mg/dL (ref 0.61–1.24)
GFR, Estimated: >60 mL/min (ref >60)
Glucose, Bld: 134 mg/dL — ABNORMAL HIGH (ref 70–99)
Potassium: 4.5 mmol/L (ref 3.5–5.1)
Sodium: 137 mmol/L (ref 135–145)

## 2023-11-16 LAB — CBC
HCT: 29.7 % — ABNORMAL LOW (ref 39.0–52.0)
Hemoglobin: 9.3 g/dL — ABNORMAL LOW (ref 13.0–17.0)
MCH: 31.6 pg (ref 26.0–34.0)
MCHC: 31.3 g/dL (ref 30.0–36.0)
MCV: 101 fL — ABNORMAL HIGH (ref 80.0–100.0)
Platelets: 74 10*3/uL — ABNORMAL LOW (ref 150–400)
RBC: 2.94 MIL/uL — ABNORMAL LOW (ref 4.22–5.81)
RDW: 13 % (ref 11.5–15.5)
WBC: 12.5 10*3/uL — ABNORMAL HIGH (ref 4.0–10.5)
nRBC: 0 % (ref 0.0–0.2)

## 2023-11-16 MED ORDER — SODIUM CHLORIDE 0.9 % IV BOLUS
500.0000 mL | Freq: Once | INTRAVENOUS | Status: AC
  Administered 2023-11-16: 500 mL via INTRAVENOUS

## 2023-11-16 MED ORDER — METHOCARBAMOL 500 MG PO TABS: 500.0000 mg | ORAL_TABLET | Freq: Four times a day (QID) | ORAL | 1 refills | Status: DC | PRN

## 2023-11-16 MED ORDER — HYDROCODONE-ACETAMINOPHEN 5-325 MG PO TABS
1.0000 | ORAL_TABLET | Freq: Four times a day (QID) | ORAL | 0 refills | Status: DC | PRN
Start: 2023-11-16 — End: 2023-11-22

## 2023-11-16 NOTE — Progress Notes (Signed)
 Blood pressure is 91/54 (65). Patient is asymptomatic. On-call provider Lyndol Santee has been notified. 500 mL of normal saline has been ordered. Morning blood pressure medication is need to be held. RN will continue to monitor.

## 2023-11-16 NOTE — Care Management Obs Status (Signed)
 MEDICARE OBSERVATION STATUS NOTIFICATION   Patient Details  Name: Rameek Grasser MRN: [id number] Date of Birth: 1931-08-15   Medicare Observation Status Notification Given:  Yes Pt signed electronically in advance.    Katrine Parody, LCSW 11/16/2023, 10:44 AM

## 2023-11-16 NOTE — Progress Notes (Signed)
 Subjective: 1 Day Post-Op Procedure(s) (LRB): ARTHROPLASTY, HIP, TOTAL, ANTERIOR APPROACH (Left) Patient reports pain as moderate.  Some lightheadedness when up with PT.  Did have acute blood loss anemia from surgery.  Objective: Vital signs in last 24 hours: Temp:  [96.3 F (35.7 C)-99.8 F (37.7 C)] 98.5 F (36.9 C) (04/26 1005) Pulse Rate:  [57-99] 72 (04/26 1007) Resp:  [14-25] 16 (04/26 1005) BP: (87-120)/(46-72) 92/53 (04/26 1007) SpO2:  [94 %-100 %] 96 % (04/26 1005)  Intake/Output from previous day: 04/25 0701 - 04/26 0700 In: 1850 [P.O.:360; I.V.:1190; IV Piggyback:300] Out: 2900 [Urine:2650; Blood:250] Intake/Output this shift: No intake/output data recorded.  Recent Labs    11/16/23 0341  HGB 9.3*   Recent Labs    11/16/23 0341  WBC 12.5*  RBC 2.94*  HCT 29.7*  PLT 74*   Recent Labs    11/16/23 0341  NA 137  K 4.5  CL 106  CO2 20*  BUN 23  CREATININE 1.10  GLUCOSE 134*  CALCIUM  8.2*   No results for input(s): "LABPT", "INR" in the last 72 hours.  Sensation intact distally Intact pulses distally Dorsiflexion/Plantar flexion intact Incision: dressing C/D/I   Assessment/Plan: 1 Day Post-Op Procedure(s) (LRB): ARTHROPLASTY, HIP, TOTAL, ANTERIOR APPROACH (Left) Up with therapy Plan for discharge tomorrow Discharge home with home health, but discharge only if doing well.      Chad Norris 11/16/2023, 10:43 AM

## 2023-11-16 NOTE — Progress Notes (Signed)
 Physical Therapy Treatment Patient Details Name: Chad Norris MRN: [id number] DOB: 1932/04/18 Today's Date: 11/16/2023   History of Present Illness Pt s/p L THR and with hx of afib, CVA and protstate CA    PT Comments  Pt very cooperative and performed therex program with assist before up to EOB and then to ambulate in room but distance ltd by c/o increasing dizziness - RN in room assessing pt.    If plan is discharge home, recommend the following: A little help with walking and/or transfers;A little help with bathing/dressing/bathroom;Assistance with cooking/housework;Assist for transportation;Help with stairs or ramp for entrance   Can travel by private vehicle        Equipment Recommendations  Rolling walker (2 wheels)    Recommendations for Other Services       Precautions / Restrictions Precautions Precautions: Fall Precaution/Restrictions Comments: Dizzy with OOB activity Restrictions Weight Bearing Restrictions Per Provider Order: No LLE Weight Bearing Per Provider Order: Weight bearing as tolerated     Mobility  Bed Mobility Overal bed mobility: Needs Assistance Bed Mobility: Supine to Sit     Supine to sit: Min assist, Mod assist, +2 for physical assistance, +2 for safety/equipment     General bed mobility comments: Increased time with cues for sequence and use of R LE to self assist    Transfers Overall transfer level: Needs assistance Equipment used: Rolling walker (2 wheels) Transfers: Sit to/from Stand Sit to Stand: Mod assist           General transfer comment: cues for LE management and use of UEs to self assist;  Physical assist to bring wt up and fwd and to balance in standing with RW    Ambulation/Gait Ambulation/Gait assistance: Min assist Gait Distance (Feet): 3 Feet Assistive device: Rolling walker (2 wheels) Gait Pattern/deviations: Step-to pattern, Decreased step length - right, Decreased step length - left, Shuffle, Trunk  flexed Gait velocity: decr     General Gait Details: cues for sequence, posture and position from RW - distance ltd by c/o dizziness   Stairs             Wheelchair Mobility     Tilt Bed    Modified Rankin (Stroke Patients Only)       Balance Overall balance assessment: Needs assistance Sitting-balance support: No upper extremity supported, Feet supported Sitting balance-Leahy Scale: Fair     Standing balance support: Bilateral upper extremity supported Standing balance-Leahy Scale: Poor                              Communication Communication Communication: Impaired Factors Affecting Communication: Hearing impaired  Cognition Arousal: Alert Behavior During Therapy: WFL for tasks assessed/performed   PT - Cognitive impairments: No apparent impairments                         Following commands: Intact      Cueing Cueing Techniques: Verbal cues, Gestural cues, Tactile cues  Exercises Total Joint Exercises Ankle Circles/Pumps: AROM, Both, 15 reps, Supine Quad Sets: AROM, Both, 10 reps, Supine Heel Slides: AAROM, Left, 20 reps, Supine Hip ABduction/ADduction: AAROM, Left, 15 reps, Supine    General Comments        Pertinent Vitals/Pain Pain Assessment Pain Assessment: 0-10 Pain Score: 5  Pain Location: L hip Pain Descriptors / Indicators: Aching, Sore Pain Intervention(s): Limited activity within patient's tolerance, Monitored during session, Premedicated before session,  Ice applied    Home Living                          Prior Function            PT Goals (current goals can now be found in the care plan section) Acute Rehab PT Goals Patient Stated Goal: Regain IND PT Goal Formulation: With patient Time For Goal Achievement: 11/28/23 Potential to Achieve Goals: Good Progress towards PT goals: Progressing toward goals    Frequency    7X/week      PT Plan      Co-evaluation               AM-PAC PT "6 Clicks" Mobility   Outcome Measure  Help needed turning from your back to your side while in a flat bed without using bedrails?: A Lot Help needed moving from lying on your back to sitting on the side of a flat bed without using bedrails?: A Lot Help needed moving to and from a bed to a chair (including a wheelchair)?: A Lot Help needed standing up from a chair using your arms (e.g., wheelchair or bedside chair)?: A Lot Help needed to walk in hospital room?: Total Help needed climbing 3-5 steps with a railing? : Total 6 Click Score: 10    End of Session Equipment Utilized During Treatment: Gait belt Activity Tolerance: Patient limited by fatigue Patient left: in chair;with call bell/phone within reach;with chair alarm set Nurse Communication: Mobility status PT Visit Diagnosis: Unsteadiness on feet (R26.81);Muscle weakness (generalized) (M62.81);Difficulty in walking, not elsewhere classified (R26.2);Pain Pain - Right/Left: Left Pain - part of body: Hip     Time: 1610-9604 PT Time Calculation (min) (ACUTE ONLY): 27 min  Charges:    $Gait Training: 8-22 mins $Therapeutic Exercise: 8-22 mins PT General Charges $$ ACUTE PT VISIT: 1 Visit                     Thedora Finlay PT Acute Rehabilitation Services Pager [phone] Office [phone]    Trell Secrist 11/16/2023, 12:56 PM

## 2023-11-16 NOTE — Discharge Instructions (Signed)
 Per [facility] clinic policy, our goal is ensure optimal postoperative pain control with a multimodal pain management strategy. For all [facility] patients, our goal is to wean post-operative narcotic medications by 6 weeks post-operatively. If this is not possible due to utilization of pain medication prior to surgery, your [facility] doctor will support your acute post-operative pain control for the first 6 weeks postoperatively, with a plan to transition you back to your primary pain team following that. Chad Norris will work to ensure a Therapist, occupational.  INSTRUCTIONS AFTER JOINT REPLACEMENT   Remove items at home which could result in a fall. This includes throw rugs or furniture in walking pathways ICE to the affected joint every three hours while awake for 30 minutes at a time, for at least the first 3-5 days, and then as needed for pain and swelling.  Continue to use ice for pain and swelling. You may notice swelling that will progress down to the foot and ankle.  This is normal after surgery.  Elevate your leg when you are not up walking on it.   Continue to use the breathing machine you got in the hospital (incentive spirometer) which will help keep your temperature down.  It is common for your temperature to cycle up and down following surgery, especially at night when you are not up moving around and exerting yourself.  The breathing machine keeps your lungs expanded and your temperature down.   DIET:  As you were doing prior to hospitalization, we recommend a well-balanced diet.  DRESSING / WOUND CARE / SHOWERING  Keep the surgical dressing until follow up.  The dressing is water proof, so you can shower without any extra covering.  IF THE DRESSING FALLS OFF or the wound gets wet inside, change the dressing with sterile gauze.  Please use good hand washing techniques before changing the dressing.  Do not use any lotions or creams on the incision until instructed by your surgeon.     ACTIVITY  Increase activity slowly as tolerated, but follow the weight bearing instructions below.   No driving for 6 weeks or until further direction given by your physician.  You cannot drive while taking narcotics.  No lifting or carrying greater than 10 lbs. until further directed by your surgeon. Avoid periods of inactivity such as sitting longer than an hour when not asleep. This helps prevent blood clots.  You may return to work once you are authorized by your doctor.     WEIGHT BEARING   Weight bearing as tolerated with assist device (walker, cane, etc) as directed, use it as long as suggested by your surgeon or therapist, typically at least 4-6 weeks.   EXERCISES  Results after joint replacement surgery are often greatly improved when you follow the exercise, range of motion and muscle strengthening exercises prescribed by your doctor. Safety measures are also important to protect the joint from further injury. Any time any of these exercises cause you to have increased pain or swelling, decrease what you are doing until you are comfortable again and then slowly increase them. If you have problems or questions, call your caregiver or physical therapist for advice.   Rehabilitation is important following a joint replacement. After just a few days of immobilization, the muscles of the leg can become weakened and shrink (atrophy).  These exercises are designed to build up the tone and strength of the thigh and leg muscles and to improve motion. Often times heat used for twenty to thirty minutes before  working out will loosen up your tissues and help with improving the range of motion but do not use heat for the first two weeks following surgery (sometimes heat can increase post-operative swelling).   These exercises can be done on a training (exercise) mat, on the floor, on a table or on a bed. Use whatever works the best and is most comfortable for you.    Use music or television  while you are exercising so that the exercises are a pleasant break in your day. This will make your life better with the exercises acting as a break in your routine that you can look forward to.   Perform all exercises about fifteen times, three times per day or as directed.  You should exercise both the operative leg and the other leg as well.  Exercises include:   Quad Sets - Tighten up the muscle on the front of the thigh (Quad) and hold for 5-10 seconds.   Straight Leg Raises - With your knee straight (if you were given a brace, keep it on), lift the leg to 60 degrees, hold for 3 seconds, and slowly lower the leg.  Perform this exercise against resistance later as your leg gets stronger.  Leg Slides: Lying on your back, slowly slide your foot toward your buttocks, bending your knee up off the floor (only go as far as is comfortable). Then slowly slide your foot back down until your leg is flat on the floor again.  Angel Wings: Lying on your back spread your legs to the side as far apart as you can without causing discomfort.  Hamstring Strength:  Lying on your back, push your heel against the floor with your leg straight by tightening up the muscles of your buttocks.  Repeat, but this time bend your knee to a comfortable angle, and push your heel against the floor.  You may put a pillow under the heel to make it more comfortable if necessary.   A rehabilitation program following joint replacement surgery can speed recovery and prevent re-injury in the future due to weakened muscles. Contact your doctor or a physical therapist for more information on knee rehabilitation.    CONSTIPATION  Constipation is defined medically as fewer than three stools per week and severe constipation as less than one stool per week.  Even if you have a regular bowel pattern at home, your normal regimen is likely to be disrupted due to multiple reasons following surgery.  Combination of anesthesia, postoperative  narcotics, change in appetite and fluid intake all can affect your bowels.   YOU MUST use at least one of the following options; they are listed in order of increasing strength to get the job done.  They are all available over the counter, and you may need to use some, POSSIBLY even all of these options:    Drink plenty of fluids (prune juice may be helpful) and high fiber foods Colace 100 mg by mouth twice a day  Senokot for constipation as directed and as needed Dulcolax (bisacodyl), take with full glass of water  Miralax (polyethylene glycol) once or twice a day as needed.  If you have tried all these things and are unable to have a bowel movement in the first 3-4 days after surgery call either your surgeon or your primary doctor.    If you experience loose stools or diarrhea, hold the medications until you stool forms back up.  If your symptoms do not get better within 1 week  or if they get worse, check with your doctor.  If you experience "the worst abdominal pain ever" or develop nausea or vomiting, please contact the office immediately for further recommendations for treatment.   ITCHING:  If you experience itching with your medications, try taking only a single pain pill, or even half a pain pill at a time.  You can also use Benadryl over the counter for itching or also to help with sleep.   TED HOSE STOCKINGS:  Use stockings on both legs until for at least 2 weeks or as directed by physician office. They may be removed at night for sleeping.  MEDICATIONS:  See your medication summary on the "After Visit Summary" that nursing will review with you.  You may have some home medications which will be placed on hold until you complete the course of blood thinner medication.  It is important for you to complete the blood thinner medication as prescribed.  PRECAUTIONS:  If you experience chest pain or shortness of breath - call 911 immediately for transfer to the hospital emergency department.    If you develop a fever greater that 101 F, purulent drainage from wound, increased redness or drainage from wound, foul odor from the wound/dressing, or calf pain - CONTACT YOUR SURGEON.                                                   FOLLOW-UP APPOINTMENTS:  If you do not already have a post-op appointment, please call the office for an appointment to be seen by your surgeon.  Guidelines for how soon to be seen are listed in your "After Visit Summary", but are typically between 1-4 weeks after surgery.  OTHER INSTRUCTIONS:   Knee Replacement:  Do not place pillow under knee, focus on keeping the knee straight while resting. CPM instructions: 0-90 degrees, 2 hours in the morning, 2 hours in the afternoon, and 2 hours in the evening. Place foam block, curve side up under heel at all times except when in CPM or when walking.  DO NOT modify, tear, cut, or change the foam block in any way.  POST-OPERATIVE OPIOID TAPER INSTRUCTIONS: It is important to wean off of your opioid medication as soon as possible. If you do not need pain medication after your surgery it is ok to stop day one. Opioids include: Codeine, Hydrocodone(Norco, Vicodin), Oxycodone(Percocet, oxycontin) and hydromorphone amongst others.  Long term and even short term use of opiods can cause: Increased pain response Dependence Constipation Depression Respiratory depression And more.  Withdrawal symptoms can include Flu like symptoms Nausea, vomiting And more Techniques to manage these symptoms Hydrate well Eat regular healthy meals Stay active Use relaxation techniques(deep breathing, meditating, yoga) Do Not substitute Alcohol to help with tapering If you have been on opioids for less than two weeks and do not have pain than it is ok to stop all together.  Plan to wean off of opioids This plan should start within one week post op of your joint replacement. Maintain the same interval or time between taking each dose  and first decrease the dose.  Cut the total daily intake of opioids by one tablet each day Next start to increase the time between doses. The last dose that should be eliminated is the evening dose.   MAKE SURE YOU:  Understand these instructions.  Get help right away if you are not doing well or get worse.    Thank you for letting us be a part of your medical care team.  It is a privilege we respect greatly.  We hope these instructions will help you stay on track for a fast and full recovery!      Dental Antibiotics:  In most cases prophylactic antibiotics for Dental procdeures after total joint surgery are not necessary.  Exceptions are as follows:  1. History of prior total joint infection  2. Severely immunocompromised (Organ Transplant, cancer chemotherapy, Rheumatoid biologic meds such as Humera)  3. Poorly controlled diabetes (A1C &gt; 8.0, blood glucose over 200)  If you have one of these conditions, contact your surgeon for an antibiotic prescription, prior to your dental procedure.

## 2023-11-16 NOTE — Progress Notes (Signed)
 Physical Therapy Treatment Patient Details Name: Chad Norris MRN: [id number] DOB: 12-10-1931 Today's Date: 11/16/2023   History of Present Illness Pt s/p L THR and with hx of afib, CVA and protstate CA    PT Comments  Pt continues cooperative but limited by symptomatic orthostatic hypotension with attempts to mobilize.  BP supine 115/55; sit 110/63; stand 100/53; stand 2 min to use urinal 83/52; return to supine 112/57.  RN present during session to assist.    If plan is discharge home, recommend the following: A little help with walking and/or transfers;A little help with bathing/dressing/bathroom;Assistance with cooking/housework;Assist for transportation;Help with stairs or ramp for entrance   Can travel by private vehicle        Equipment Recommendations  Rolling walker (2 wheels)    Recommendations for Other Services       Precautions / Restrictions Precautions Precautions: Fall Precaution/Restrictions Comments: Dizzy with OOB activity Restrictions Weight Bearing Restrictions Per Provider Order: No LLE Weight Bearing Per Provider Order: Weight bearing as tolerated     Mobility  Bed Mobility Overal bed mobility: Needs Assistance Bed Mobility: Supine to Sit, Sit to Supine     Supine to sit: Min assist, Mod assist, +2 for physical assistance, +2 for safety/equipment Sit to supine: Mod assist   General bed mobility comments: Increased time with cues for sequence and use of R LE to self assist    Transfers Overall transfer level: Needs assistance Equipment used: Rolling walker (2 wheels) Transfers: Sit to/from Stand Sit to Stand: Min assist, Mod assist, From elevated surface           General transfer comment: cues for LE management and use of UEs to self assist;  Physical assist to bring wt up and fwd and to balance in standing with RW    Ambulation/Gait Ambulation/Gait assistance: Min assist Gait Distance (Feet): 3 Feet Assistive device: Rolling  walker (2 wheels) Gait Pattern/deviations: Step-to pattern, Decreased step length - right, Decreased step length - left, Shuffle, Trunk flexed Gait velocity: decr     General Gait Details: Pt stood to use urinal and for orthostatic testing but gait limited to side shuffle along EOB 2* c/o dizziness and BP 83/52.   Stairs             Wheelchair Mobility     Tilt Bed    Modified Rankin (Stroke Patients Only)       Balance Overall balance assessment: Needs assistance Sitting-balance support: No upper extremity supported, Feet supported Sitting balance-Leahy Scale: Fair     Standing balance support: Bilateral upper extremity supported Standing balance-Leahy Scale: Poor                              Communication Communication Communication: Impaired Factors Affecting Communication: Hearing impaired  Cognition Arousal: Alert Behavior During Therapy: WFL for tasks assessed/performed   PT - Cognitive impairments: No apparent impairments                         Following commands: Intact      Cueing Cueing Techniques: Verbal cues, Gestural cues, Tactile cues  Exercises Total Joint Exercises Ankle Circles/Pumps: AROM, Both, 15 reps, Supine Quad Sets: AROM, Both, 10 reps, Supine Heel Slides: AAROM, Left, 20 reps, Supine Hip ABduction/ADduction: AAROM, Left, 15 reps, Supine    General Comments        Pertinent Vitals/Pain Pain Assessment Pain Assessment: 0-10  Pain Score: 4  Pain Location: L hip Pain Descriptors / Indicators: Aching, Sore Pain Intervention(s): Limited activity within patient's tolerance, Monitored during session, Premedicated before session, Ice applied    Home Living                          Prior Function            PT Goals (current goals can now be found in the care plan section) Acute Rehab PT Goals Patient Stated Goal: Regain IND PT Goal Formulation: With patient Time For Goal Achievement:  11/28/23 Potential to Achieve Goals: Good Progress towards PT goals: Progressing toward goals    Frequency    7X/week      PT Plan      Co-evaluation              AM-PAC PT "6 Clicks" Mobility   Outcome Measure  Help needed turning from your back to your side while in a flat bed without using bedrails?: A Lot Help needed moving from lying on your back to sitting on the side of a flat bed without using bedrails?: A Lot Help needed moving to and from a bed to a chair (including a wheelchair)?: A Lot Help needed standing up from a chair using your arms (e.g., wheelchair or bedside chair)?: A Lot Help needed to walk in hospital room?: Total Help needed climbing 3-5 steps with a railing? : Total 6 Click Score: 10    End of Session Equipment Utilized During Treatment: Gait belt Activity Tolerance: Patient limited by fatigue Patient left: in chair;with call bell/phone within reach;with chair alarm set Nurse Communication: Mobility status PT Visit Diagnosis: Unsteadiness on feet (R26.81);Muscle weakness (generalized) (M62.81);Difficulty in walking, not elsewhere classified (R26.2);Pain Pain - Right/Left: Left Pain - part of body: Hip     Time: 4696-2952 PT Time Calculation (min) (ACUTE ONLY): 22 min  Charges:    $Therapeutic Activity: 8-22 mins PT General Charges $$ ACUTE PT VISIT: 1 Visit                     Thedora Finlay PT Acute Rehabilitation Services Pager [phone] Office [phone]    Chad Norris 11/16/2023, 4:11 PM

## 2023-11-16 NOTE — Plan of Care (Signed)
   Problem: Coping: Goal: Level of anxiety will decrease Outcome: Progressing   Problem: Pain Managment: Goal: General experience of comfort will improve and/or be controlled Outcome: Progressing   Problem: Safety: Goal: Ability to remain free from injury will improve Outcome: Progressing

## 2023-11-17 LAB — PREPARE RBC (CROSSMATCH)

## 2023-11-17 LAB — CBC
HCT: 29.4 % — ABNORMAL LOW (ref 39.0–52.0)
Hemoglobin: 9.4 g/dL — ABNORMAL LOW (ref 13.0–17.0)
MCH: 31.8 pg (ref 26.0–34.0)
MCHC: 32 g/dL (ref 30.0–36.0)
MCV: 99.3 fL (ref 80.0–100.0)
Platelets: 78 10*3/uL — ABNORMAL LOW (ref 150–400)
RBC: 2.96 MIL/uL — ABNORMAL LOW (ref 4.22–5.81)
RDW: 13.2 % (ref 11.5–15.5)
WBC: 15.5 10*3/uL — ABNORMAL HIGH (ref 4.0–10.5)
nRBC: 0 % (ref 0.0–0.2)

## 2023-11-17 LAB — HEMOGLOBIN AND HEMATOCRIT, BLOOD
HCT: 27.9 % — ABNORMAL LOW (ref 39.0–52.0)
Hemoglobin: 9.3 g/dL — ABNORMAL LOW (ref 13.0–17.0)

## 2023-11-17 MED ORDER — SODIUM CHLORIDE 0.9% IV SOLUTION: Freq: Once | INTRAVENOUS | Status: AC

## 2023-11-17 MED ORDER — SODIUM CHLORIDE 0.45 % IV BOLUS
500.0000 mL | Freq: Once | INTRAVENOUS | Status: AC
  Administered 2023-11-17: 500 mL via INTRAVENOUS

## 2023-11-17 MED ORDER — SODIUM CHLORIDE 0.9 % IV BOLUS: 500.0000 mL | Freq: Once | INTRAVENOUS | Status: DC

## 2023-11-17 NOTE — Progress Notes (Signed)
 Phone discussion with Cyrena Drilling PA-C concerning patient's postural hypotension with standing and ambulating with Physical Therapy. Verbal order by Creston Doles received for additional 500 ml .45 NS bolus, 1 unit of PRBC, and EKG. Orders received and entered.

## 2023-11-17 NOTE — Progress Notes (Signed)
 Physical Therapy Treatment Patient Details Name: Chad Norris MRN: [id number] DOB: [birth year] Today's Date: 11/17/2023   History of Present Illness Pt s/p L THR and with hx of afib, CVA and protstate CA    PT Comments  Pt continues cooperative but progress limited by orthostatic hypotension - BP supine 104/61; sitting 64/52; after return to supine 119/57 - RN aware.    If plan is discharge home, recommend the following: A little help with walking and/or transfers;A little help with bathing/dressing/bathroom;Assistance with cooking/housework;Assist for transportation;Help with stairs or ramp for entrance   Can travel by private vehicle        Equipment Recommendations  Rolling walker (2 wheels)    Recommendations for Other Services       Precautions / Restrictions Precautions Precautions: Fall Precaution/Restrictions Comments: Dizzy with OOB activity Restrictions Weight Bearing Restrictions Per Provider Order: No LLE Weight Bearing Per Provider Order: Weight bearing as tolerated     Mobility  Bed Mobility Overal bed mobility: Needs Assistance Bed Mobility: Supine to Sit, Sit to Supine     Supine to sit: Min assist, Mod assist, +2 for physical assistance, +2 for safety/equipment Sit to supine: Mod assist   General bed mobility comments: Increased time with cues for sequence and use of R LE to self assist    Transfers                   General transfer comment: deferred 2* pt drop in BP with sitting    Ambulation/Gait                   Stairs             Wheelchair Mobility     Tilt Bed    Modified Rankin (Stroke Patients Only)       Balance Overall balance assessment: Needs assistance Sitting-balance support: No upper extremity supported, Feet supported Sitting balance-Leahy Scale: Fair                                      Hotel manager: Impaired Factors Affecting  Communication: Hearing impaired  Cognition Arousal: Alert Behavior During Therapy: WFL for tasks assessed/performed   PT - Cognitive impairments: No apparent impairments                         Following commands: Intact      Cueing Cueing Techniques: Verbal cues, Gestural cues, Tactile cues  Exercises Total Joint Exercises Ankle Circles/Pumps: AROM, Both, 15 reps, Supine Quad Sets: AROM, Both, 10 reps, Supine Heel Slides: AAROM, Left, 20 reps, Supine Hip ABduction/ADduction: AAROM, Left, 15 reps, Supine    General Comments        Pertinent Vitals/Pain Pain Assessment Pain Assessment: 0-10 Pain Score: 4  Pain Location: L hip Pain Descriptors / Indicators: Aching, Sore Pain Intervention(s): Limited activity within patient's tolerance, Monitored during session, Premedicated before session, Ice applied    Home Living                          Prior Function            PT Goals (current goals can now be found in the care plan section) Acute Rehab PT Goals Patient Stated Goal: Regain IND PT Goal Formulation: With patient Time For Goal Achievement: 11/28/23 Potential to Achieve Goals:  Good Progress towards PT goals: Not progressing toward goals - comment (orthostatic)    Frequency    7X/week      PT Plan      Co-evaluation              AM-PAC PT "6 Clicks" Mobility   Outcome Measure  Help needed turning from your back to your side while in a flat bed without using bedrails?: A Lot Help needed moving from lying on your back to sitting on the side of a flat bed without using bedrails?: A Lot Help needed moving to and from a bed to a chair (including a wheelchair)?: A Lot Help needed standing up from a chair using your arms (e.g., wheelchair or bedside chair)?: A Lot Help needed to walk in hospital room?: Total Help needed climbing 3-5 steps with a railing? : Total 6 Click Score: 10    End of Session Equipment Utilized During  Treatment: Gait belt Activity Tolerance: Patient limited by fatigue (orthostatic) Patient left: in bed;with call bell/phone within reach;with bed alarm set Nurse Communication: Mobility status PT Visit Diagnosis: Unsteadiness on feet (R26.81);Muscle weakness (generalized) (M62.81);Difficulty in walking, not elsewhere classified (R26.2);Pain Pain - Right/Left: Left Pain - part of body: Hip     Time: 0947-1010 PT Time Calculation (min) (ACUTE ONLY): 23 min  Charges:    $Therapeutic Exercise: 8-22 mins $Therapeutic Activity: 8-22 mins PT General Charges $$ ACUTE PT VISIT: 1 Visit                     Thedora Finlay PT Acute Rehabilitation Services Pager [phone] Office [phone]    [facility] 11/17/2023, 12:57 PM

## 2023-11-17 NOTE — Progress Notes (Signed)
 Subjective: 2 Days Post-Op Procedure(s) (LRB): ARTHROPLASTY, HIP, TOTAL, ANTERIOR APPROACH (Left) Patient reports pain as mild.  No complaints this am.   Objective: Vital signs in last 24 hours: Temp:  [98.4 F (36.9 C)-99.3 F (37.4 C)] 98.4 F (36.9 C) (04/27 0523) Pulse Rate:  [72-94] 89 (04/27 0523) Resp:  [16-19] 19 (04/27 0523) BP: (92-144)/(46-72) 98/60 (04/27 0523) SpO2:  [94 %-97 %] 94 % (04/27 0523)  Intake/Output from previous day: 04/26 0701 - 04/27 0700 In: 300 [P.O.:300] Out: 1450 [Urine:1450] Intake/Output this shift: No intake/output data recorded.  Recent Labs    11/16/23 0341 11/17/23 0329  HGB 9.3* 9.4*   Recent Labs    11/16/23 0341 11/17/23 0329  WBC 12.5* 15.5*  RBC 2.94* 2.96*  HCT 29.7* 29.4*  PLT 74* 78*   Recent Labs    11/16/23 0341  NA 137  K 4.5  CL 106  CO2 20*  BUN 23  CREATININE 1.10  GLUCOSE 134*  CALCIUM  8.2*   No results for input(s): "LABPT", "INR" in the last 72 hours.  Neurologically intact Neurovascular intact Sensation intact distally Intact pulses distally Dorsiflexion/Plantar flexion intact Incision: scant drainage No cellulitis present Compartment soft   Assessment/Plan: 2 Days Post-Op Procedure(s) (LRB): ARTHROPLASTY, HIP, TOTAL, ANTERIOR APPROACH (Left) Advance diet Up with therapy Discharge home with home health likely tomorrow ABLA- mild and stable- BP soft this am.  Will order fluid bolus.  Please hold BP meds while BP soft WBAT LLE      Sandie Cross 11/17/2023, 9:16 AM

## 2023-11-17 NOTE — Progress Notes (Signed)
 Patient complain of feeling flushed; temp taken and 102.4; Tylenol  given; see MAR; call and spoke to [facility] notifying of temp; no new orders received.

## 2023-11-17 NOTE — Progress Notes (Signed)
 PT reports patient's SBP dropped to 60's upon standing during therapy; patient currently lying in bed; NS bolus continue per orders; current BP stable; patient sleeping but easily aroused and alert and oriented. Denies pain or acute distress.

## 2023-11-18 ENCOUNTER — Inpatient Hospital Stay (HOSPITAL_COMMUNITY)

## 2023-11-18 ENCOUNTER — Encounter (HOSPITAL_COMMUNITY): Payer: Self-pay | Admitting: Orthopaedic Surgery

## 2023-11-18 DIAGNOSIS — I5189 Other ill-defined heart diseases: Secondary | ICD-10-CM

## 2023-11-18 DIAGNOSIS — I4891 Unspecified atrial fibrillation: Secondary | ICD-10-CM | POA: Diagnosis not present

## 2023-11-18 DIAGNOSIS — E861 Hypovolemia: Secondary | ICD-10-CM

## 2023-11-18 DIAGNOSIS — I959 Hypotension, unspecified: Secondary | ICD-10-CM

## 2023-11-18 DIAGNOSIS — D693 Immune thrombocytopenic purpura: Secondary | ICD-10-CM | POA: Diagnosis not present

## 2023-11-18 DIAGNOSIS — I447 Left bundle-branch block, unspecified: Secondary | ICD-10-CM

## 2023-11-18 DIAGNOSIS — N179 Acute kidney failure, unspecified: Secondary | ICD-10-CM | POA: Diagnosis not present

## 2023-11-18 DIAGNOSIS — D62 Acute posthemorrhagic anemia: Secondary | ICD-10-CM

## 2023-11-18 DIAGNOSIS — I9581 Postprocedural hypotension: Secondary | ICD-10-CM | POA: Insufficient documentation

## 2023-11-18 LAB — ECHOCARDIOGRAM COMPLETE
AR max vel: 1.95 cm2
AV Area VTI: 2.13 cm2
AV Area mean vel: 1.73 cm2
AV Mean grad: 8 mmHg
AV Peak grad: 12.8 mmHg
Ao pk vel: 1.79 m/s
Area-P 1/2: 3.55 cm2
Calc EF: 59.4 %
Height: 72 in
MV VTI: 3.04 cm2
S' Lateral: 3.3 cm
Single Plane A2C EF: 51.2 %
Single Plane A4C EF: 64.9 %
Weight: 3008 [oz_av]

## 2023-11-18 LAB — URINALYSIS, ROUTINE W REFLEX MICROSCOPIC
Bacteria, UA: NONE SEEN
Bilirubin Urine: NEGATIVE
Glucose, UA: NEGATIVE mg/dL
Ketones, ur: 5 mg/dL — AB
Nitrite: NEGATIVE
Protein, ur: 30 mg/dL — AB
Specific Gravity, Urine: 1.025 (ref 1.005–1.030)
pH: 5 (ref 5.0–8.0)

## 2023-11-18 LAB — BASIC METABOLIC PANEL WITH GFR
Anion gap: 10 (ref 5–15)
BUN: 34 mg/dL — ABNORMAL HIGH (ref 8–23)
CO2: 20 mmol/L — ABNORMAL LOW (ref 22–32)
Calcium: 7.4 mg/dL — ABNORMAL LOW (ref 8.9–10.3)
Chloride: 101 mmol/L (ref 98–111)
Creatinine, Ser: 1.36 mg/dL — ABNORMAL HIGH (ref 0.61–1.24)
GFR, Estimated: 50 mL/min — ABNORMAL LOW (ref >60)
Glucose, Bld: 146 mg/dL — ABNORMAL HIGH (ref 70–99)
Potassium: 4.1 mmol/L (ref 3.5–5.1)
Sodium: 131 mmol/L — ABNORMAL LOW (ref 135–145)

## 2023-11-18 LAB — CBC
HCT: 23.5 % — ABNORMAL LOW (ref 39.0–52.0)
Hemoglobin: 7.8 g/dL — ABNORMAL LOW (ref 13.0–17.0)
MCH: 31.6 pg (ref 26.0–34.0)
MCHC: 33.2 g/dL (ref 30.0–36.0)
MCV: 95.1 fL (ref 80.0–100.0)
Platelets: 106 10*3/uL — ABNORMAL LOW (ref 150–400)
RBC: 2.47 MIL/uL — ABNORMAL LOW (ref 4.22–5.81)
RDW: 13.7 % (ref 11.5–15.5)
WBC: 21.6 10*3/uL — ABNORMAL HIGH (ref 4.0–10.5)
nRBC: 0.1 % (ref 0.0–0.2)

## 2023-11-18 LAB — GLUCOSE, CAPILLARY
Glucose-Capillary: 122 mg/dL — ABNORMAL HIGH (ref 70–99)
Glucose-Capillary: 128 mg/dL — ABNORMAL HIGH (ref 70–99)

## 2023-11-18 LAB — TSH: TSH: 1.73 u[IU]/mL (ref 0.350–4.500)

## 2023-11-18 LAB — TYPE AND SCREEN
ABO/RH(D): O POS
Antibody Screen: NEGATIVE

## 2023-11-18 LAB — MAGNESIUM: Magnesium: 2.2 mg/dL (ref 1.7–2.4)

## 2023-11-18 LAB — TROPONIN I (HIGH SENSITIVITY)
Troponin I (High Sensitivity): 62 ng/L — ABNORMAL HIGH (ref <18)
Troponin I (High Sensitivity): 60 ng/L — ABNORMAL HIGH (ref <18)

## 2023-11-18 LAB — PREPARE RBC (CROSSMATCH)

## 2023-11-18 LAB — LACTIC ACID, PLASMA: Lactic Acid, Venous: 0.7 mmol/L (ref 0.5–1.9)

## 2023-11-18 MED ORDER — SODIUM CHLORIDE 0.9 % IV BOLUS
500.0000 mL | Freq: Once | INTRAVENOUS | Status: AC | PRN
  Administered 2023-11-18: 500 mL via INTRAVENOUS

## 2023-11-18 MED ORDER — METOPROLOL TARTRATE 12.5 MG HALF TABLET
12.5000 mg | ORAL_TABLET | Freq: Two times a day (BID) | ORAL | Status: DC
  Administered 2023-11-18: 12.5 mg via ORAL
  Filled 2023-11-18: qty 1

## 2023-11-18 MED ORDER — NOREPINEPHRINE 4 MG/250ML-% IV SOLN: 2.0000 ug/min | INTRAVENOUS | Status: DC

## 2023-11-18 MED ORDER — CHLORHEXIDINE GLUCONATE CLOTH 2 % EX PADS
6.0000 | MEDICATED_PAD | Freq: Every day | CUTANEOUS | Status: DC
  Administered 2023-11-18: 6 via TOPICAL

## 2023-11-18 MED ORDER — ORAL CARE MOUTH RINSE: 15.0000 mL | OROMUCOSAL | Status: DC | PRN

## 2023-11-18 MED ORDER — SODIUM CHLORIDE 0.9 % IV SOLN: 250.0000 mL | INTRAVENOUS | Status: AC

## 2023-11-18 MED ORDER — VANCOMYCIN HCL 1750 MG/350ML IV SOLN
1750.0000 mg | Freq: Once | INTRAVENOUS | Status: AC
  Administered 2023-11-18: 1750 mg via INTRAVENOUS
  Filled 2023-11-18: qty 350

## 2023-11-18 MED ORDER — AMIODARONE IV BOLUS ONLY 150 MG/100ML
INTRAVENOUS | Status: DC
Start: 2023-11-18 — End: 2023-11-18
  Filled 2023-11-18: qty 100

## 2023-11-18 MED ORDER — BISACODYL 10 MG RE SUPP
10.0000 mg | Freq: Every day | RECTAL | Status: DC | PRN
  Administered 2023-11-18: 10 mg via RECTAL
  Filled 2023-11-18 (×2): qty 1

## 2023-11-18 MED ORDER — POLYETHYLENE GLYCOL 3350 17 G PO PACK
17.0000 g | PACK | Freq: Every day | ORAL | Status: DC | PRN
  Administered 2023-11-18: 17 g via ORAL
  Filled 2023-11-18: qty 1

## 2023-11-18 MED ORDER — AMIODARONE HCL IN DEXTROSE 360-4.14 MG/200ML-% IV SOLN
INTRAVENOUS | Status: AC
  Filled 2023-11-18: qty 200

## 2023-11-18 MED ORDER — VANCOMYCIN HCL 1250 MG/250ML IV SOLN
1250.0000 mg | INTRAVENOUS | Status: DC
Start: 2023-11-19 — End: 2023-11-19
  Filled 2023-11-18: qty 250

## 2023-11-18 MED ORDER — SODIUM CHLORIDE 0.9% IV SOLUTION: Freq: Once | INTRAVENOUS | Status: AC

## 2023-11-18 MED ORDER — NOREPINEPHRINE 4 MG/250ML-% IV SOLN
INTRAVENOUS | Status: AC
  Administered 2023-11-18: 6 ug/min via INTRAVENOUS
  Filled 2023-11-18: qty 250

## 2023-11-18 MED ORDER — LACTATED RINGERS IV BOLUS
500.0000 mL | Freq: Once | INTRAVENOUS | Status: AC
Start: 2023-11-18 — End: 2023-11-19
  Administered 2023-11-18: 500 mL via INTRAVENOUS

## 2023-11-18 MED ORDER — FUROSEMIDE 10 MG/ML IJ SOLN
20.0000 mg | Freq: Once | INTRAMUSCULAR | Status: DC
  Filled 2023-11-18: qty 2

## 2023-11-18 NOTE — Progress Notes (Signed)
 Pharmacy Antibiotic Note  Donn Angstadt is a 89 y.o. male admitted on 11/15/2023 with sepsis.  Pharmacy has been consulted for vanc dosing.  Plan: Vanc 1750mg  IV x 1 then 1250mg  IV q24 - goal AUC 400-550  Height: 6' (182.9 cm) Weight: 85.3 kg (188 lb) IBW/kg (Calculated) : 77.6  Temp (24hrs), Avg:99 F (37.2 C), Min:97.8 F (36.6 C), Max:102.4 F (39.1 C)  Recent Labs  Lab 11/16/23 0341 11/17/23 0329 11/18/23 1014 11/18/23 1056  WBC 12.5* 15.5*  --  21.6*  CREATININE 1.10  --   --  1.36*  LATICACIDVEN  --   --  0.7  --     Estimated Creatinine Clearance: 40.4 mL/min (A) (by C-G formula based on SCr of 1.36 mg/dL (H)).    No Known Allergies   Thank you for allowing pharmacy to be a part of this patient's care.  Bernett Brill 11/18/2023 2:28 PM

## 2023-11-18 NOTE — Progress Notes (Signed)
 Patient ID: Chad Norris, male   DOB: 1932/06/19, 89 y.o.   MRN: [id number] I did just see the patient at the bedside and he looks much better overall.  I certainly appreciate rapid response as well as cardiology and the intensive care service assisting in this patient's care.  He did going into A-fib with RVR.  He has been in chronic A-fib.  He has been on Eliquis  for a long period of time.  His rate is now controlled and is in the 70s when at the bedside.  I did notice that his creatinine had bumped up to 1.36 from 1.1 and his hemoglobin is down to 7.8.  From my standpoint, I feel that he would benefit from the volume of a blood transfusion.  I will put an order for that and let us  this is objected to by the consult services who are assisting in his care.  I talked to him and his wife at the bedside.

## 2023-11-18 NOTE — Progress Notes (Signed)
 Patient ID: Chad Norris, male   DOB: 17-May-1932, 89 y.o.   MRN: [id number] The patient is awake and alert this morning.  I did change his left hip dressing due to some blood stains on the dressing.  Overall the incision looks good.  He has had some difficulty with not having a bowel movement so we will put him on several medications to see if this will help.  He has still been orthostatic went up.  He did get a unit of blood yesterday.  His hemoglobin was 9.4 but it was felt with his orthostatic hypotension that a unit of blood would be worthwhile.  His vital signs are stable this morning.  We will see how he mobilized with PT to determine when he can go home.  Maybe even this afternoon depending on how he is doing.

## 2023-11-18 NOTE — Plan of Care (Signed)
   Problem: Coping: Goal: Level of anxiety will decrease Outcome: Progressing   Problem: Pain Managment: Goal: General experience of comfort will improve and/or be controlled Outcome: Progressing   Problem: Safety: Goal: Ability to remain free from injury will improve Outcome: Progressing

## 2023-11-18 NOTE — Progress Notes (Signed)
 Patient c/o chest pain. Unable to rate but reports as "a little." Noted to be pale and sweaty. Previous c/o abdominal discomfort and more recent c/o left leg and foot discomfort. Notified Dr. Lucienne Ryder.  EKG done and Morphine 1mg  IV given. Stat CBC and BMET ordered. VS initially appearing stable but noted not to coincide with EKG machine. Some runs of v-tach observed followed by a-fib with RVR. Oxygen applied at 2lpm via nasal cannula. VS: 98/50(63), HR160 (on EKG), 100% oxygen saturation.  Rapid Response notified and arrived. Dr. Lucienne Ryder updated.  As preparing to transport to step-down unit, patient c/o nausea.  Ara Knee, RN 11/18/23 9:21 AM

## 2023-11-18 NOTE — Progress Notes (Signed)
 PT Cancellation Note  Patient Details Name: Chad Norris MRN: [id number] DOB: 1931/10/19   Cancelled Treatment:     Medical workup and transfer to step down.  Will hold off PT for today.  Bess Broody  PTA Acute  Rehabilitation Services Office M-F          [phone]

## 2023-11-18 NOTE — Progress Notes (Signed)
  Echocardiogram 2D Echocardiogram has been performed.  Anaiya Wisinski L Other Atienza RDCS 11/18/2023, 3:37 PM

## 2023-11-18 NOTE — Consult Note (Addendum)
 Cardiology Consultation   Patient ID: Chad Norris MRN: [id number]; DOB: [birth year]  Admit date: 11/15/2023 Date of Consult: 11/18/2023  PCP:  Colene Dauphin, MD   [facility] Providers Cardiologist:  Dr Alois Arnt    Patient Profile:   Chad Norris is a 89 y.o. male with a hx of paroxysmal A fib, HLD, osteoarthritis, ITP, prostate cancer, CVA,  who is being seen 11/18/2023 for the evaluation of A fib RVR at the request of Dr Lucienne Ryder.  History of Present Illness:   [name] with above PMH presented to [facility] hospital on 11/15/23 and underwent elective left direct anterior total hip arthroplasty. Post op course he was borderline hypotensive required IVF, received PRBC transfusion for acute blood loss anemia with Hgb from 13 to 9 range. He was c/o chest pain, abdominal pain, appeared pale and diaphoretic this morning, RRT was called, EKG showed A fib RVR 160s (not uploaded), and BP down to 77/45, he was transferred to ICU. Cardiology is consulted today for A fib RVR.   ICU team had given him 1LNS bolus and started Levophed gtt, BP has improved to SBP 100s at this time. He remained in A fib, but ventricular rate down to 90s during encounter. He is sleepy, opened eyes to voice and states he feels OK, no further chest pain, denied feeling dizzy, mentation is normal and responding to question or commands appropriately. He states he only takes PRN cardizem  at home for elevated HR for A fib. He is on Eliquis  PTA, compliant per wife,  this was held since Thursday before his hip surgery.   He sees Dr Amanda Jungling in the office, has paroxysmal A fib, on Eliquis  5mg  BID and PRN diltiazem  30mg  q6h historically. Last Echo 08/19/18 showed LVEF 50-55%, indeterminate diastolic, mild hypokinesis of the basiliar-mid anteroseptal left ventricular segment, Septal hypokinesis is likely due to LBBB, normal RV, trivial MR. Exercise stress 06/27/2008 normal. He suppose to transition to Dr. Alda Amas  going forward.   Note he had fever 102.4 on 11/17/23 evening. Labs from 11/16/23 with unremarkable BMP. WBC 12500 >15500, Hgb 9.4, PLT 78k on 11/17/23. Labs pending today.       Past Medical History:  Diagnosis Date   Arthritis    Atrial fibrillation (HCC)    Colon polyps    COVID-19    Diverticulosis    Gout    Hemorrhoids    Hyperlipemia    Nephrolithiasis    Pneumonia    Prostate cancer (HCC)    Stroke [facility])     Past Surgical History:  Procedure Laterality Date   CATARACT EXTRACTION W/ INTRAOCULAR LENS  IMPLANT, BILATERAL     bi-lat   CERVICAL SPINE SURGERY     COLONOSCOPY W/ POLYPECTOMY     Epidural steroid injections     3-4, Dr Ellery Guthrie   RETROPUBIC PROSTATECTOMY     SHOULDER SURGERY     left     Home Medications:  Prior to Admission medications   Medication Sig Start Date End Date Taking? Authorizing Provider  acetaminophen  (TYLENOL ) 325 MG tablet Take 2 tablets (650 mg total) by mouth every 6 (six) hours as needed for moderate pain. 06/21/22  Yes Adolph Hoop, PA-C  apixaban  (ELIQUIS ) 5 MG TABS tablet Take 1 tablet (5 mg total) by mouth 2 (two) times daily. 06/24/23  Yes BranchTomas Fountain, MD  atorvastatin  (LIPITOR) 40 MG tablet TAKE 1 TABLET(40 MG) BY MOUTH DAILY Patient taking differently: Take 40 mg by mouth  at bedtime. TAKE 1 TABLET(40 MG) BY MOUTH DAILY 10/01/23  Yes Burns, Beckey Bourgeois, MD  Cholecalciferol (VITAMIN D3 PO) Take 1 tablet by mouth in the morning.   Yes [provider]  diltiazem  (CARDIZEM ) 30 MG tablet Take 1 tablet (30 mg total) by mouth every 6 (six) hours as needed (for heart rate greater than 100 bpm.). 06/24/23  Yes Branch, Tomas Fountain, MD  Multiple Vitamins-Minerals (ADULT ONE DAILY GUMMIES) CHEW Chew 2 tablets by mouth in the morning. Vitafusion Adult Gummy Vitamins for Men   Yes [provider]  triamcinolone  cream (KENALOG ) 0.1 % APPLY TOPICALLY TO THE AFFECTED AREA TWICE DAILY Patient taking differently: Apply 1 Application topically 2  (two) times daily as needed (skin irritation/rash). 07/02/23  Yes Burns, Beckey Bourgeois, MD  HYDROcodone -acetaminophen  (NORCO/VICODIN) 5-325 MG tablet Take 1-2 tablets by mouth every 6 (six) hours as needed for moderate pain (pain score 4-6). 11/16/23   [name], MD  methocarbamol  (ROBAXIN ) 500 MG tablet Take 1 tablet (500 mg total) by mouth every 6 (six) hours as needed for muscle spasms. 11/16/23   [name], MD    Inpatient Medications: Scheduled Meds:  apixaban   5 mg Oral BID   docusate sodium   100 mg Oral BID   pantoprazole  40 mg Oral Daily   Continuous Infusions:  sodium chloride  75 mL/hr at 11/15/23 1528   amiodarone     amiodarone     norepinephrine     PRN Meds: acetaminophen , alum & mag hydroxide-simeth, amiodarone, amiodarone, bisacodyl, diltiazem , diphenhydrAMINE , HYDROcodone -acetaminophen , HYDROcodone -acetaminophen , menthol-cetylpyridinium **OR** phenol, methocarbamol  **OR** methocarbamol  (ROBAXIN ) injection, metoCLOPramide **OR** metoCLOPramide (REGLAN) injection, morphine injection, norepinephrine, ondansetron  **OR** ondansetron  (ZOFRAN ) IV, polyethylene glycol  Allergies:   No Known Allergies  Social History:   Social History   Socioeconomic History   Marital status: Married    Spouse name: Not on file   Number of children: 2   Years of education: Not on file   Highest education level: 12th grade  Occupational History   Occupation: retired  Tobacco Use   Smoking status: Never   Smokeless tobacco: Never  Vaping Use   Vaping status: Never Used  Substance and Sexual Activity   Alcohol use: Not Currently    Alcohol/week: 1.0 standard drink of alcohol    Types: 1 Cans of beer per week   Drug use: No   Sexual activity: Not Currently  Other Topics Concern   Not on file  Social History Narrative   Not on file   Social Drivers of Health   Financial Resource Strain: Low Risk  (10/26/2022)   Overall Financial Resource Strain (CARDIA)     Difficulty of Paying Living Expenses: Not very hard  Food Insecurity: No Food Insecurity (11/15/2023)   Hunger Vital Sign    Worried About Running Out of Food in the Last Year: Never true    Ran Out of Food in the Last Year: Never true  Transportation Needs: No Transportation Needs (11/15/2023)   PRAPARE - Administrator, Civil Service (Medical): No    Lack of Transportation (Non-Medical): No  Physical Activity: Insufficiently Active (10/26/2022)   Exercise Vital Sign    Days of Exercise per Week: 1 day    Minutes of Exercise per Session: 10 min  Stress: No Stress Concern Present (10/26/2022)   [employer] of Occupational Health - Occupational Stress Questionnaire    Feeling of Stress : Only a little  Social Connections: Socially Integrated (11/15/2023)  Social Advertising account executive [NHANES]    Frequency of Communication with Friends and Family: More than three times a week    Frequency of Social Gatherings with Friends and Family: More than three times a week    Attends Religious Services: 1 to 4 times per year    Active Member of [employer] or Organizations: Yes    Attends Banker Meetings: 1 to 4 times per year    Marital Status: Married  Catering manager Violence: Not At Risk (11/15/2023)   Humiliation, Afraid, Rape, and Kick questionnaire    Fear of Current or Ex-Partner: No    Emotionally Abused: No    Physically Abused: No    Sexually Abused: No    Family History:    Family History  Problem Relation Age of Onset   Kidney cancer Father    Colon cancer Father    Cancer Brother        ?   Heart attack Brother 55   Heart attack Sister 13   Breast cancer Sister    Diabetes Brother    Alcohol abuse Brother    Colon polyps Brother      ROS:  Constitutional: see HPI  Eyes: Denied vision change or loss Ears/Nose/Mouth/Throat: Denied ear ache, sore throat, coughing, sinus pain Cardiovascular: see HPI  Respiratory: denied shortness of  breath Gastrointestinal: Denied nausea, vomiting, abdominal pain, diarrhea Genital/Urinary: Denied dysuria, hematuria, urinary frequency/urgency Musculoskeletal: s/p left hip surgery  Skin: Denied rash, wound Neuro: Denied headache, dizziness, syncope Psych: Denied history of depression/anxiety  Endocrine: Denied history of diabetes   Physical Exam/Data:   Vitals:   11/17/23 2154 11/17/23 2355 11/18/23 0536 11/18/23 0845  BP: (!) 106/59 135/66 112/71 109/78  Pulse: 87 97 86 97  Resp: 15  18 19   Temp: 98.6 F (37 C) 97.8 F (36.6 C) 98.2 F (36.8 C) 98.8 F (37.1 C)  TempSrc: Oral Oral Oral Oral  SpO2: 98% 98% 97% 100%  Weight:      Height:        Intake/Output Summary (Last 24 hours) at 11/18/2023 1002 Last data filed at 11/18/2023 0200 Gross per 24 hour  Intake 450 ml  Output 250 ml  Net 200 ml      11/15/2023    7:53 AM 09/10/2023    9:33 AM 06/24/2023    9:20 AM  Last 3 Weights  Weight (lbs) 188 lb 188 lb 191 lb 6.4 oz  Weight (kg) 85.276 kg 85.276 kg 86.818 kg     Body mass index is 25.5 kg/m.   Vitals:  Vitals:   11/18/23 1010 11/18/23 1015  BP: (!) 103/56 (!) 105/55  Pulse: 95 95  Resp: 20 (!) 25  Temp:    SpO2: 100% 100%   General Appearance: In no apparent distress, laying in bed, pale, oral cavity dry  HEENT: Normocephalic, atraumatic.  Neck: Supple, trachea midline, no JVDs Cardiovascular: Irregularly irregular, normal S1-S2,  no murmur Respiratory: Resting breathing unlabored, lungs sounds clear to auscultation bilaterally, no use of accessory muscles. On room air.   Gastrointestinal: Bowel sounds positive, abdomen soft Extremities: Able to move all extremities in bed without difficulty, no pitting edema of legs, SCDs on Musculoskeletal: Normal muscle bulk and tone, s/p left hip surgery Skin: Intact, warm, dry.  Neurologic: Alert, oriented to person, place and time. no cognitive deficit Psychiatric: Normal affect. Mood is appropriate.    EKG:   The EKG was personally reviewed and demonstrates:  not uploaded  Telemetry:  Telemetry was personally reviewed and demonstrates:  A fib RVR up to 160s, currently VR 90s , LBBB noted   Relevant CV Studies:   Echo from 08/19/18:  1. The left ventricle appears to be normal in size, has normal wall  thickness 50-55% ejection fraction Spectral Doppler shows indeterminate  pattern of diastolic filling.   2. There is mild hypokinesis of the basiliar-mid anteroseptal left  ventricular segment.   3. Septal hypokinesis is likely due to LBBB.   4. Right ventricular systolic pressure is is normal.   5. The right ventricle has normal size and normal systolic function.   6. Normal left atrial size.   7. Normal right atrial size.   8. Mitral valve regurgitation is trivial by color flow Doppler.   9. The mitral valve normal in structure and function.  10. Normal tricuspid valve.  11. Aortic valve normal.  12. No atrial level shunt detected by color flow Doppler.    Laboratory Data:  High Sensitivity Troponin:  No results for input(s): "TROPONINIHS" in the last 720 hours.   Chemistry Recent Labs  Lab 11/16/23 0341  NA 137  K 4.5  CL 106  CO2 20*  GLUCOSE 134*  BUN 23  CREATININE 1.10  CALCIUM  8.2*  GFRNONAA >60  ANIONGAP 11    No results for input(s): "PROT", "ALBUMIN ", "AST", "ALT", "ALKPHOS", "BILITOT" in the last 168 hours. Lipids No results for input(s): "CHOL", "TRIG", "HDL", "LABVLDL", "LDLCALC", "CHOLHDL" in the last 168 hours.  Hematology Recent Labs  Lab 11/16/23 0341 11/17/23 0329 11/17/23 1815  WBC 12.5* 15.5*  --   RBC 2.94* 2.96*  --   HGB 9.3* 9.4* 9.3*  HCT 29.7* 29.4* 27.9*  MCV 101.0* 99.3  --   MCH 31.6 31.8  --   MCHC 31.3 32.0  --   RDW 13.0 13.2  --   PLT 74* 78*  --    Thyroid  No results for input(s): "TSH", "FREET4" in the last 168 hours.  BNPNo results for input(s): "BNP", "PROBNP" in the last 168 hours.  DDimer No results for input(s): "DDIMER"  in the last 168 hours.   Radiology/Studies:  DG HIP UNILAT WITH PELVIS 1V LEFT Result Date: 11/15/2023 CLINICAL DATA:  Intraoperative fluoroscopy. Total left hip arthroplasty. EXAM: DG HIP (WITH OR WITHOUT PELVIS) 1V*L* COMPARISON:  Pelvis and left hip radiographs 10/31/2022 FINDINGS: Images were performed intraoperatively without the presence of a radiologist. The patient is undergoing total left hip arthroplasty. No hardware complication is seen. Surgical clips again overlie the prostate bed. Total fluoroscopy images: 3 Total fluoroscopy time: 24 seconds Total dose: Radiation Exposure Index (as provided by the fluoroscopic device): 3.2 mGy air Kerma Please see intraoperative findings for further detail. IMPRESSION: Intraoperative fluoroscopy for total left hip arthroplasty. Electronically Signed   By: Bertina Broccoli M.D.   On: 11/15/2023 14:34   DG Pelvis Portable Result Date: 11/15/2023 CLINICAL DATA:  Left hip arthroplasty. EXAM: PORTABLE PELVIS 1-2 VIEWS COMPARISON:  Radiograph dated 10/31/2022. FINDINGS: There is a total left hip arthroplasty. The arthroplasty components appear intact and in anatomic alignment. There is no acute fracture or dislocation. Severe arthritic changes of the right hip. Surgical clips over the pelvis. IMPRESSION: Total left hip arthroplasty. Electronically Signed   By: Angus Bark M.D.   On: 11/15/2023 12:53   DG C-Arm 1-60 Min-No Report Result Date: 11/15/2023 Fluoroscopy was utilized by the requesting physician.  No radiographic interpretation.     Assessment and Plan:  Paroxysmal A fib RVR - known PAF, on PRN cardizem  and Eliquis  5mg  BID PTA, now s/p left hip surgery, suspect stress mediated with dehydration, recent fever, surgery, blood loss, etc.  - will check TSH, Mag, Echo; defer infection workup if indicated to primary team  - ventricular rate improved from 160 to 90s after receiving IVF bolus and levophed for hypotension, given immediate post op, will  start PO metoprolol 12.5mg  BID for rate control, wean off vasopressor when able, hold off amiodarone load unless he remains hypotensive with RVR, unknown duration of A fib as he was not on monitor, with interruption of anticoagulation would avoid chemical conversion if able  - keep Mag >2 and K >4 , maintain adequate hydration, keep Hgb >7  - Eliquis  5mg  BID has been resumed on 11/16/23 by Ortho team, would continue if no bleeding, watch PLT count given chronic ITP  - has follow up 01/16/24 with Dr Alda Amas, will arrange sooner follow up when near discharge   Hypotension - likely due to dehydration, blood loss, +/- fever, A fib RVR  - BP has improved with fluid and vasopressor support   Leukocytosis Anemia  Left hip OA s/p arthroplasty Chronic ITP  Hx of CVA  - per primary team        Risk Assessment/Risk Scores:   CHA2DS2-VASc Score = 4   This indicates a 4.8% annual risk of stroke. The patient's score is based upon: CHF History: 0 HTN History: 0 Diabetes History: 0 Stroke History: 2 Vascular Disease History: 0 Age Score: 2 Gender Score: 0     For questions or updates, please contact [facility] Please consult [email or web address] for contact info under    Signed, Malena Timpone, NP  11/18/2023 10:02 AM

## 2023-11-18 NOTE — Plan of Care (Signed)
  Problem: Clinical Measurements: Goal: Ability to maintain clinical measurements within normal limits will improve Outcome: Progressing Goal: Cardiovascular complication will be avoided Outcome: Progressing   Problem: Activity: Goal: Risk for activity intolerance will decrease Outcome: Progressing   Problem: Elimination: Goal: Will not experience complications related to bowel motility Outcome: Progressing   Problem: Pain Managment: Goal: General experience of comfort will improve and/or be controlled Outcome: Progressing

## 2023-11-18 NOTE — Progress Notes (Signed)
 PT Cancellation Note  Patient Details Name: Chad Norris MRN: [id number] DOB: [birth year]   Cancelled Treatment:        Lynessa Almanzar Ann 11/18/2023, 2:28 PM

## 2023-11-18 NOTE — Plan of Care (Signed)
  Problem: Pain Managment: Goal: General experience of comfort will improve and/or be controlled 11/18/2023 0539 by Beola Brazil, RN Outcome: Progressing 11/18/2023 0535 by Beola Brazil, RN Outcome: Progressing   Problem: Safety: Goal: Ability to remain free from injury will improve 11/18/2023 0539 by Beola Brazil, RN Outcome: Progressing 11/18/2023 0535 by Beola Brazil, RN Outcome: Progressing   Problem: Coping: Goal: Level of anxiety will decrease 11/18/2023 0539 by Beola Brazil, RN Outcome: Progressing 11/18/2023 0535 by Beola Brazil, RN Outcome: Progressing

## 2023-11-18 NOTE — Consult Note (Signed)
 NAME:  Chad Norris, MRN:  [id number], DOB:  [birth year], LOS: 2 ADMISSION DATE:  11/15/2023, CONSULTATION DATE:  11/18/23 REFERRING MD:  Lucienne Ryder, CHIEF COMPLAINT:  Afib RVR   History of Present Illness:  Chad Norris is a 89 y.o. M with PMH significant for paroxysmal atrial fibrillation on Eliquis , HL, ITP, prostate Ca. HL, Gout, CVA who presented to [facility] 4/25 for L Hip arthroplasty which was uncomplicated.  He did have some post-op dizziness and hypotension and was transfused 1 unit PRBC's. On the morning of 4/28 he became acutely hypotensive and EKG showed Afib with RVR with BP as low as 77/45, he was transferred to the ICU in this setting.  He takes Cardizem  prn at home and had not been given since admission, his Eliquis  was resumed post-op.  He denies chest pain or shortness of breath and was fatigued, but alert and oriented   Pertinent  Medical History   has a past medical history of Arthritis, Atrial fibrillation (HCC), Colon polyps, COVID-19, Diverticulosis, Gout, Hemorrhoids, Hyperlipemia, Nephrolithiasis, Pneumonia, Prostate cancer (HCC), and Stroke (HCC).   Significant Hospital Events: Including procedures, antibiotic start and stop dates in addition to other pertinent events   4/25 L hip arthoplasty   Interim History / Subjective:  HR improved from 170's to 80's with 1.5L IVF Requiring norepi 6mcg Labs pending Seen by cardiology  Objective   Blood pressure (!) 105/55, pulse 95, temperature 98.8 F (37.1 C), temperature source Oral, resp. rate (!) 25, height 6' (1.829 m), weight 85.3 kg, SpO2 100%.        Intake/Output Summary (Last 24 hours) at 11/18/2023 1020 Last data filed at 11/18/2023 0200 Gross per 24 hour  Intake 450 ml  Output 250 ml  Net 200 ml   Filed Weights   11/15/23 0753  Weight: 85.3 kg    General:  ill-appearing elderly M resting in bed in NAD HEENT: MM pink/moist Neuro: alert and oriented, moving all extremities  CV: s1s2 tachycardic,  irregular, no m/r/g PULM:  clear bilaterally on [place] GI: soft, non-tender  Extremities: warm/dry, no edema  Skin: no rashes or lesions  Resolved Hospital Problem list     Assessment & Plan:   Atrial Fibrillation with RVR Hypotension HR improved with 1.5L IVF, did not require amiodarone infusion -Lactate 0.7, rest of labs are pending, correct electrolytes prn -appreciate cardiology recs, echo pending -continue Eliquis  bid, continue prn cardizem  and Metoprolol 12.5mg  bid per cards -continue norepi to maintain MAP >65 -TSH pending   AKI Creatinine 1.36 -likely pre-renal from volume depletion -monitor renal indices after IVF -monitor UOP  -avoid nephrotoxins   HL -resume lipitor   Chronic ITP -monitor platelets, stable at 106  Thank your for this consult, we will continue to follow along with you Rest of plan per primary team  Best Practice (right click and "Reselect all SmartList Selections" daily)   Diet/type: dysphagia diet (see orders) DVT prophylaxis DOAC Pressure ulcer(s): N/A GI prophylaxis: N/A Lines: N/A Foley:  N/A Code Status:  full code Last date of multidisciplinary goals of care discussion [wife updated at the bedside]  Labs   CBC: Recent Labs  Lab 11/16/23 0341 11/17/23 0329 11/17/23 1815  WBC 12.5* 15.5*  --   HGB 9.3* 9.4* 9.3*  HCT 29.7* 29.4* 27.9*  MCV 101.0* 99.3  --   PLT 74* 78*  --     Basic Metabolic Panel: Recent Labs  Lab 11/16/23 0341  NA 137  K 4.5  CL 106  CO2 20*  GLUCOSE 134*  BUN 23  CREATININE 1.10  CALCIUM  8.2*   GFR: Estimated Creatinine Clearance: 50 mL/min (by C-G formula based on SCr of 1.1 mg/dL). Recent Labs  Lab 11/16/23 0341 11/17/23 0329  WBC 12.5* 15.5*    Liver Function Tests: No results for input(s): "AST", "ALT", "ALKPHOS", "BILITOT", "PROT", "ALBUMIN " in the last 168 hours. No results for input(s): "LIPASE", "AMYLASE" in the last 168 hours. No results for input(s): "AMMONIA" in the last  168 hours.  ABG    Component Value Date/Time   TCO2 24 03/23/2016 1601     Coagulation Profile: No results for input(s): "INR", "PROTIME" in the last 168 hours.  Cardiac Enzymes: No results for input(s): "CKTOTAL", "CKMB", "CKMBINDEX", "TROPONINI" in the last 168 hours.  HbA1C: Hgb A1c MFr Bld  Date/Time Value Ref Range Status  06/06/2022 10:25 AM 5.1 4.6 - 6.5 % Final    Comment:    Glycemic Control Guidelines for People with Diabetes:Non Diabetic:  <6%Goal of Therapy: <7%Additional Action Suggested:  >8%   06/05/2021 10:31 AM 5.2 4.6 - 6.5 % Final    Comment:    Glycemic Control Guidelines for People with Diabetes:Non Diabetic:  <6%Goal of Therapy: <7%Additional Action Suggested:  >8%     CBG: Recent Labs  Lab 11/18/23 0907  GLUCAP 122*    Review of Systems:   Please see the history of present illness. All other systems reviewed and are negative    Past Medical History:  He,  has a past medical history of Arthritis, Atrial fibrillation (HCC), Colon polyps, COVID-19, Diverticulosis, Gout, Hemorrhoids, Hyperlipemia, Nephrolithiasis, Pneumonia, Prostate cancer (HCC), and Stroke (HCC).   Surgical History:   Past Surgical History:  Procedure Laterality Date   CATARACT EXTRACTION W/ INTRAOCULAR LENS  IMPLANT, BILATERAL     bi-lat   CERVICAL SPINE SURGERY     COLONOSCOPY W/ POLYPECTOMY     Epidural steroid injections     3-4, Dr Ellery Guthrie   RETROPUBIC PROSTATECTOMY     SHOULDER SURGERY     left     Social History:   reports that he has never smoked. He has never used smokeless tobacco. He reports that he does not currently use alcohol after a past usage of about 1.0 standard drink of alcohol per week. He reports that he does not use drugs.   Family History:  His family history includes Alcohol abuse in his brother; Breast cancer in his sister; Cancer in his brother; Colon cancer in his father; Colon polyps in his brother; Diabetes in his brother; Heart attack (age of  onset: 4) in his brother; Heart attack (age of onset: 44) in his sister; Kidney cancer in his father.   Allergies No Known Allergies   Home Medications  Prior to Admission medications   Medication Sig Start Date End Date Taking? Authorizing Provider  acetaminophen  (TYLENOL ) 325 MG tablet Take 2 tablets (650 mg total) by mouth every 6 (six) hours as needed for moderate pain. 06/21/22  Yes Adolph Hoop, PA-C  apixaban  (ELIQUIS ) 5 MG TABS tablet Take 1 tablet (5 mg total) by mouth 2 (two) times daily. 06/24/23  Yes BranchTomas Fountain, MD  atorvastatin  (LIPITOR) 40 MG tablet TAKE 1 TABLET(40 MG) BY MOUTH DAILY Patient taking differently: Take 40 mg by mouth at bedtime. TAKE 1 TABLET(40 MG) BY MOUTH DAILY 10/01/23  Yes Burns, Beckey Bourgeois, MD  Cholecalciferol (VITAMIN D3 PO) Take 1 tablet by mouth in the morning.   Yes [provider]  diltiazem  (CARDIZEM ) 30 MG tablet Take 1 tablet (30 mg total) by mouth every 6 (six) hours as needed (for heart rate greater than 100 bpm.). 06/24/23  Yes Branch, Tomas Fountain, MD  Multiple Vitamins-Minerals (ADULT ONE DAILY GUMMIES) CHEW Chew 2 tablets by mouth in the morning. Vitafusion Adult Gummy Vitamins for Men   Yes [provider]  triamcinolone  cream (KENALOG ) 0.1 % APPLY TOPICALLY TO THE AFFECTED AREA TWICE DAILY Patient taking differently: Apply 1 Application topically 2 (two) times daily as needed (skin irritation/rash). 07/02/23  Yes Burns, Beckey Bourgeois, MD  HYDROcodone -acetaminophen  (NORCO/VICODIN) 5-325 MG tablet Take 1-2 tablets by mouth every 6 (six) hours as needed for moderate pain (pain score 4-6). 11/16/23   [name], MD  methocarbamol  (ROBAXIN ) 500 MG tablet Take 1 tablet (500 mg total) by mouth every 6 (six) hours as needed for muscle spasms. 11/16/23   [name], MD     Critical care time:  50  minutes     CRITICAL CARE Performed by: Patt Boozer Temia Debroux   Total critical care time: 50 minutes  Critical care time was  exclusive of separately billable procedures and treating other patients.  Critical care was necessary to treat or prevent imminent or life-threatening deterioration.  Critical care was time spent personally by me on the following activities: development of treatment plan with patient and/or surrogate as well as nursing, discussions with consultants, evaluation of patient's response to treatment, examination of patient, obtaining history from patient or surrogate, ordering and performing treatments and interventions, ordering and review of laboratory studies, ordering and review of radiographic studies, pulse oximetry and re-evaluation of patient's condition.   Patt Boozer Tramya Schoenfelder, PA-C [facility] See Amion for pager If no response to pager , please call [phone] until 7pm After 7:00 pm call Elink  [phone]

## 2023-11-19 DIAGNOSIS — I4891 Unspecified atrial fibrillation: Secondary | ICD-10-CM | POA: Diagnosis not present

## 2023-11-19 DIAGNOSIS — R509 Fever, unspecified: Secondary | ICD-10-CM | POA: Diagnosis not present

## 2023-11-19 DIAGNOSIS — I9581 Postprocedural hypotension: Secondary | ICD-10-CM

## 2023-11-19 DIAGNOSIS — D693 Immune thrombocytopenic purpura: Secondary | ICD-10-CM | POA: Diagnosis not present

## 2023-11-19 DIAGNOSIS — I447 Left bundle-branch block, unspecified: Secondary | ICD-10-CM

## 2023-11-19 LAB — BASIC METABOLIC PANEL WITH GFR
Anion gap: 10 (ref 5–15)
BUN: 31 mg/dL — ABNORMAL HIGH (ref 8–23)
CO2: 18 mmol/L — ABNORMAL LOW (ref 22–32)
Calcium: 7.3 mg/dL — ABNORMAL LOW (ref 8.9–10.3)
Chloride: 105 mmol/L (ref 98–111)
Creatinine, Ser: 0.98 mg/dL (ref 0.61–1.24)
GFR, Estimated: >60 mL/min (ref >60)
Glucose, Bld: 112 mg/dL — ABNORMAL HIGH (ref 70–99)
Potassium: 3.8 mmol/L (ref 3.5–5.1)
Sodium: 133 mmol/L — ABNORMAL LOW (ref 135–145)

## 2023-11-19 LAB — CBC
HCT: 27.9 % — ABNORMAL LOW (ref 39.0–52.0)
Hemoglobin: 8.9 g/dL — ABNORMAL LOW (ref 13.0–17.0)
MCH: 30.5 pg (ref 26.0–34.0)
MCHC: 31.9 g/dL (ref 30.0–36.0)
MCV: 95.5 fL (ref 80.0–100.0)
Platelets: 82 10*3/uL — ABNORMAL LOW (ref 150–400)
RBC: 2.92 MIL/uL — ABNORMAL LOW (ref 4.22–5.81)
RDW: 14.5 % (ref 11.5–15.5)
WBC: 13.3 10*3/uL — ABNORMAL HIGH (ref 4.0–10.5)
nRBC: 0.3 % — ABNORMAL HIGH (ref 0.0–0.2)

## 2023-11-19 LAB — CREATININE, SERUM
Creatinine, Ser: 1.06 mg/dL (ref 0.61–1.24)
GFR, Estimated: >60 mL/min (ref >60)

## 2023-11-19 MED ORDER — ENSURE ENLIVE PO LIQD
237.0000 mL | Freq: Two times a day (BID) | ORAL | Status: DC
  Administered 2023-11-21 – 2023-11-22 (×4): 237 mL via ORAL

## 2023-11-19 NOTE — Progress Notes (Signed)
 NAME:  Chad Norris, MRN:  [id number], DOB:  1931-10-06, LOS: 3 ADMISSION DATE:  11/15/2023, CONSULTATION DATE:  11/19/23 REFERRING MD:  Lucienne Ryder, CHIEF COMPLAINT:  Afib RVR   History of Present Illness:  Mr. Filla is a 89 y.o. M with PMH significant for paroxysmal atrial fibrillation on Eliquis , HL, ITP, prostate Ca. HL, Gout, CVA who presented to [facility] 4/25 for L Hip arthroplasty which was uncomplicated.  He did have some post-op dizziness and hypotension and was transfused 1 unit PRBC's. On the morning of 4/28 he became acutely hypotensive and EKG showed Afib with RVR with BP as low as 77/45, he was transferred to the ICU in this setting.  He takes Cardizem  prn at home and had not been given since admission, his Eliquis  was resumed post-op.  He denies chest pain or shortness of breath and was fatigued, but alert and oriented   Pertinent  Medical History   has a past medical history of Arthritis, Atrial fibrillation (HCC), Colon polyps, COVID-19, Diverticulosis, Gout, Hemorrhoids, Hyperlipemia, Nephrolithiasis, Pneumonia, Prostate cancer (HCC), and Stroke (HCC).   Significant Hospital Events: Including procedures, antibiotic start and stop dates in addition to other pertinent events   4/25 L hip arthoplasty  4/28 transferred to ICU for A-fib/RVR >> improved with IV fluids, transient norepinephrine for hypotension  Interim History / Subjective:   Off pressors Remains in sinus rhythm Defervesced Denies pain or dyspnea  Objective   Blood pressure 134/63, pulse 83, temperature 97.6 F (36.4 C), temperature source Axillary, resp. rate 15, height 6' (1.829 m), weight 85.3 kg, SpO2 99%.        Intake/Output Summary (Last 24 hours) at 11/19/2023 0900 Last data filed at 11/19/2023 2130 Gross per 24 hour  Intake 3887 ml  Output 500 ml  Net 3387 ml   Filed Weights   11/15/23 0753  Weight: 85.3 kg    General:  ill-appearing elderly M resting in bed in NAD HEENT: MM  pink/moist Neuro: alert and oriented, nonfocal CV: s1s2 regular, no murmur PULM:  clear bilaterally on [place] GI: soft, non-tender  Extremities: warm/dry, no edema  Skin: no rashes or lesions  Labs show improved hemoglobin to 8.9, decreased leukocytosis, mild thrombocytopenia, creatinine decreased to 1.0  Resolved Hospital Problem list   Hypotension  Assessment & Plan:   Atrial Fibrillation with RVR , converted to sinus rhythm TSH normal HR improved with 1.5L IVF, did not require amiodarone infusion -appreciate cardiology recs, echo pending -continue Eliquis  bid, continue prn cardizem  and Metoprolol 12.5mg  bid per cards   Transient fever and leukocytosis,  -doubt infection okay to DC antibiotics   AKI, resolved with IV fluids -likely pre-renal from volume depletion -monitor renal indices after IVF -monitor UOP  -avoid nephrotoxins    Chronic ITP -monitor platelets, slight drop  PCCM will be available as needed  Best Practice (right click and "Reselect all SmartList Selections" daily)   Diet/type: dysphagia diet (see orders) DVT prophylaxis DOAC Pressure ulcer(s): N/A GI prophylaxis: N/A Lines: N/A Foley:  N/A Code Status:  full code Last date of multidisciplinary goals of care discussion [wife updated at the bedside]  Labs   CBC: Recent Labs  Lab 11/16/23 0341 11/17/23 0329 11/17/23 1815 11/18/23 1056 11/19/23 0257  WBC 12.5* 15.5*  --  21.6* 13.3*  HGB 9.3* 9.4* 9.3* 7.8* 8.9*  HCT 29.7* 29.4* 27.9* 23.5* 27.9*  MCV 101.0* 99.3  --  95.1 95.5  PLT 74* 78*  --  106* 82*  Basic Metabolic Panel: Recent Labs  Lab 11/16/23 0341 11/18/23 1056 11/19/23 0257  NA 137 131*  --   K 4.5 4.1  --   CL 106 101  --   CO2 20* 20*  --   GLUCOSE 134* 146*  --   BUN 23 34*  --   CREATININE 1.10 1.36* 1.06  CALCIUM  8.2* 7.4*  --   MG  --  2.2  --    GFR: Estimated Creatinine Clearance: 51.9 mL/min (by C-G formula based on SCr of 1.06 mg/dL). Recent Labs   Lab 11/16/23 0341 11/17/23 0329 11/18/23 1014 11/18/23 1056 11/19/23 0257  WBC 12.5* 15.5*  --  21.6* 13.3*  LATICACIDVEN  --   --  0.7  --   --     Liver Function Tests: No results for input(s): "AST", "ALT", "ALKPHOS", "BILITOT", "PROT", "ALBUMIN " in the last 168 hours. No results for input(s): "LIPASE", "AMYLASE" in the last 168 hours. No results for input(s): "AMMONIA" in the last 168 hours.  ABG    Component Value Date/Time   TCO2 24 03/23/2016 1601     Coagulation Profile: No results for input(s): "INR", "PROTIME" in the last 168 hours.  Cardiac Enzymes: No results for input(s): "CKTOTAL", "CKMB", "CKMBINDEX", "TROPONINI" in the last 168 hours.  HbA1C: Hgb A1c MFr Bld  Date/Time Value Ref Range Status  06/06/2022 10:25 AM 5.1 4.6 - 6.5 % Final    Comment:    Glycemic Control Guidelines for People with Diabetes:Non Diabetic:  <6%Goal of Therapy: <7%Additional Action Suggested:  >8%   06/05/2021 10:31 AM 5.2 4.6 - 6.5 % Final    Comment:    Glycemic Control Guidelines for People with Diabetes:Non Diabetic:  <6%Goal of Therapy: <7%Additional Action Suggested:  >8%     CBG: Recent Labs  Lab 11/18/23 0907 11/18/23 0931  GLUCAP 122* 128*     Celene Coins MD. FCCP. [place] [facility] Pager : [phone]  If no response to pager , please call [phone] until 7 pm After 7:00 pm call Elink  [phone]   11/19/2023

## 2023-11-19 NOTE — Plan of Care (Signed)
  Problem: Clinical Measurements: Goal: Ability to maintain clinical measurements within normal limits will improve Outcome: Progressing   Problem: Activity: Goal: Risk for activity intolerance will decrease Outcome: Progressing   Problem: Pain Managment: Goal: General experience of comfort will improve and/or be controlled Outcome: Progressing

## 2023-11-19 NOTE — Progress Notes (Signed)
 Physical Therapy Treatment Patient Details Name: Chad Norris MRN: [id number] DOB: January 27, 1932 Today's Date: 11/19/2023   History of Present Illness Pt s/p L THR and with hx of afib, CVA and protstate CA    PT Comments  POD # 4 PT - Cognition Comments: AxO x 2 sleepy/tired.  Oriented to day "Tuesday".  HOH.   Seen in ICU RM # [id number]. Assisted OOB required increased time.  General bed mobility comments: Increased time with cues for sequence and use of R LE to self assist.  Right lean. General transfer comment: Required + 2 side by side assist from elevated bed onto B platform EVA walker. General Gait Details: Pt has yet to walk so used B platform EVA walker for increased support.  Very limited amb distance 4 feet with difficulty advancing L LE.  MAX c/o fatigue.  Avg HR 92 and RA 98%.  Mild c/o dizziness.  BP 134/87 Positioned in recliner to comfort. Applied ICE.     If plan is discharge home, recommend the following: A little help with walking and/or transfers;A little help with bathing/dressing/bathroom;Assistance with cooking/housework;Assist for transportation;Help with stairs or ramp for entrance   Can travel by private vehicle        Equipment Recommendations  Rolling walker (2 wheels)    Recommendations for Other Services       Precautions / Restrictions Precautions Precautions: Fall Precaution/Restrictions Comments: monitor HR Restrictions Weight Bearing Restrictions Per Provider Order: No LLE Weight Bearing Per Provider Order: Weight bearing as tolerated     Mobility  Bed Mobility Overal bed mobility: Needs Assistance Bed Mobility: Supine to Sit     Supine to sit: Max assist, +2 for safety/equipment, HOB elevated     General bed mobility comments: Increased time with cues for sequence and use of R LE to self assist.  Right lean.    Transfers Overall transfer level: Needs assistance Equipment used: Bilateral platform walker Transfers: Sit to/from Stand Sit  to Stand: Min assist, Mod assist, From elevated surface           General transfer comment: Required + 2 side by side assist from elevated bed onto B platform EVA walker.    Ambulation/Gait Ambulation/Gait assistance: Mod assist, Max assist, +2 physical assistance, +2 safety/equipment Gait Distance (Feet): 4 Feet Assistive device: Blanca Bunch Gait Pattern/deviations: Step-to pattern, Decreased step length - right, Decreased step length - left, Shuffle, Trunk flexed Gait velocity: decreased     General Gait Details: Pt has yet to walk so used B platform EVA walker for increased support.  Very limited amb distance 4 feet with difficulty advancing L LE.  MAX c/o fatigue.  Avg HR 92 and RA 98%.  Mild c/o dizziness.  BP 134/87   Stairs             Wheelchair Mobility     Tilt Bed    Modified Rankin (Stroke Patients Only)       Balance                                            Communication Communication Communication: Impaired Factors Affecting Communication: Hearing impaired  Cognition Arousal: Alert Behavior During Therapy: WFL for tasks assessed/performed   PT - Cognitive impairments: No apparent impairments  PT - Cognition Comments: AxO x 2 sleepy/tired.  Oriented to day "Tuesday".  HOH.   Seen in ICU RM # [id number]. Following commands: Intact      Cueing Cueing Techniques: Verbal cues, Gestural cues, Tactile cues  Exercises      General Comments        Pertinent Vitals/Pain Pain Assessment Pain Assessment: Faces Faces Pain Scale: Hurts little more Pain Location: L hip Pain Descriptors / Indicators: Aching, Sore, Operative site guarding Pain Intervention(s): Monitored during session, Premedicated before session, Repositioned, Ice applied    Home Living                          Prior Function            PT Goals (current goals can now be found in the care plan section) Progress towards  PT goals: Progressing toward goals    Frequency    7X/week      PT Plan      Co-evaluation              AM-PAC PT "6 Clicks" Mobility   Outcome Measure  Help needed turning from your back to your side while in a flat bed without using bedrails?: A Lot Help needed moving from lying on your back to sitting on the side of a flat bed without using bedrails?: A Lot Help needed moving to and from a bed to a chair (including a wheelchair)?: A Lot Help needed standing up from a chair using your arms (e.g., wheelchair or bedside chair)?: A Lot Help needed to walk in hospital room?: A Lot Help needed climbing 3-5 steps with a railing? : Total 6 Click Score: 11    End of Session Equipment Utilized During Treatment: Gait belt Activity Tolerance: Patient limited by fatigue Patient left: in chair;with call bell/phone within reach Nurse Communication: Mobility status PT Visit Diagnosis: Unsteadiness on feet (R26.81);Muscle weakness (generalized) (M62.81);Difficulty in walking, not elsewhere classified (R26.2);Pain Pain - Right/Left: Left Pain - part of body: Hip     Time: 4098-1191 PT Time Calculation (min) (ACUTE ONLY): 28 min  Charges:    $Gait Training: 8-22 mins $Therapeutic Activity: 8-22 mins                       Bess Broody  PTA Acute  Rehabilitation Services Office M-F          [phone]

## 2023-11-19 NOTE — Progress Notes (Addendum)
 Patient ID: Chad Norris, male   DOB: [birth year], 89 y.o.   MRN: [id number] The patient is awake and alert this morning.  His vital signs are stable.  His heart rate is normal.  His hemoglobin came up to 8.9 and his creatinine came down appropriately to his baseline.  I did change his left hip dressing that had some bloody drainage.  His thigh is not tight at all.  I do appreciate cardiology and the critical care specialist assisting in the care of this patient.  He can be transferred out of the unit to a telemetry bed if that is appropriate.  PT can resume helping his mobility from an orthopedic standpoint.  He will still need to be here until he improves medically and clinically.  He was placed on antibiotics yesterday secondary to fever and a high white count.  His white count has come down and he is afebrile.  I believe the fever was from atelectasis and the high white count was appropriate postop and the stress he was having from his A-fib with RVR.  From my standpoint, he does not need to be on antibiotics.

## 2023-11-19 NOTE — Progress Notes (Signed)
 Progress Note  Patient Name: Chad Norris Date of Encounter: 11/19/2023  [facility] Cardiologist: None   Patient Profile     Subjective   Denies any chest pain and shortness of breath stable.  Converted to normal sinus rhythm  2D echo 428 showed EF 60 to 65% with normal diastolic function and normal RV  Inpatient Medications    Scheduled Meds:  apixaban   5 mg Oral BID   Chlorhexidine  Gluconate Cloth  6 each Topical Daily   docusate sodium   100 mg Oral BID   pantoprazole  40 mg Oral Daily   Continuous Infusions:  sodium chloride  75 mL/hr at 11/19/23 0553   norepinephrine (LEVOPHED) Adult infusion Stopped (11/18/23 1817)   vancomycin     PRN Meds: acetaminophen , alum & mag hydroxide-simeth, bisacodyl, diltiazem , diphenhydrAMINE , HYDROcodone -acetaminophen , HYDROcodone -acetaminophen , menthol-cetylpyridinium **OR** phenol, methocarbamol  **OR** methocarbamol  (ROBAXIN ) injection, metoCLOPramide **OR** metoCLOPramide (REGLAN) injection, morphine injection, ondansetron  **OR** ondansetron  (ZOFRAN ) IV, mouth rinse, polyethylene glycol   Vital Signs    Vitals:   11/19/23 0900 11/19/23 1000 11/19/23 1100 11/19/23 1151  BP:  108/65 (!) 120/57   Pulse: 84 91 78   Resp: 14 (!) 26 (!) 9   Temp:    98.1 F (36.7 C)  TempSrc:    Axillary  SpO2: (!) 82% (!) 86% 99%   Weight:      Height:        Intake/Output Summary (Last 24 hours) at 11/19/2023 1208 Last data filed at 11/19/2023 0553 Gross per 24 hour  Intake 2378.74 ml  Output 500 ml  Net 1878.74 ml      11/15/2023    7:53 AM 09/10/2023    9:33 AM 06/24/2023    9:20 AM  Last 3 Weights  Weight (lbs) 188 lb 188 lb 191 lb 6.4 oz  Weight (kg) 85.276 kg 85.276 kg 86.818 kg      Telemetry    Normal sinus rhythm with left branch block- Personally Reviewed  ECG    Normal sinus rhythm with left bundle branch block 11/18/2023 personally Reviewed  Physical Exam   GEN: No acute distress.   Neck: No  JVD Cardiac: RRR, no murmurs, rubs, or gallops.  Respiratory: Clear to auscultation bilaterally. GI: Soft, nontender, non-distended  MS: No edema; No deformity. Neuro:  Nonfocal  Psych: Normal affect   Labs    High Sensitivity Troponin:   Recent Labs  Lab 11/18/23 1812 11/18/23 1937  TROPONINIHS 62* 60*      Chemistry Recent Labs  Lab 11/16/23 0341 11/18/23 1056 11/19/23 0253 11/19/23 0257  NA 137 131* 133*  --   K 4.5 4.1 3.8  --   CL 106 101 105  --   CO2 20* 20* 18*  --   GLUCOSE 134* 146* 112*  --   BUN 23 34* 31*  --   CREATININE 1.10 1.36* 0.98 1.06  CALCIUM  8.2* 7.4* 7.3*  --   GFRNONAA >60 50* >60 >60  ANIONGAP 11 10 10   --      Hematology Recent Labs  Lab 11/17/23 0329 11/17/23 1815 11/18/23 1056 11/19/23 0257  WBC 15.5*  --  21.6* 13.3*  RBC 2.96*  --  2.47* 2.92*  HGB 9.4* 9.3* 7.8* 8.9*  HCT 29.4* 27.9* 23.5* 27.9*  MCV 99.3  --  95.1 95.5  MCH 31.8  --  31.6 30.5  MCHC 32.0  --  33.2 31.9  RDW 13.2  --  13.7 14.5  PLT 78*  --  106* 82*    BNPNo results for input(s): "BNP", "PROBNP" in the last 168 hours.   DDimer No results for input(s): "DDIMER" in the last 168 hours.   CHA2DS2-VASc Score = 4   This indicates a 4.8% annual risk of stroke. The patient's score is based upon: CHF History: 0 HTN History: 0 Diabetes History: 0 Stroke History: 2 Vascular Disease History: 0 Age Score: 2 Gender Score: 0      Radiology    ECHOCARDIOGRAM COMPLETE Result Date: 11/18/2023    ECHOCARDIOGRAM REPORT   Patient Name:   Chad Norris Date of Exam: 11/18/2023 Medical Rec #:  [id number]           Height:       72.0 in Accession #:    [id number]          Weight:       188.0 lb Date of Birth:  04-11-1932           BSA:          2.075 m Patient Age:    89 years            BP:           113/76 mmHg Patient Gender: M                   HR:           82 bpm. Exam Location:  Inpatient Procedure: 2D Echo, Cardiac Doppler and Color Doppler (Both  Spectral and Color            Flow Doppler were utilized during procedure). Indications:    Atrial fibrillation  History:        Patient has prior history of Echocardiogram examinations, most                 recent 08/19/2018. Stroke; Risk Factors:Dyslipidemia.  Sonographer:    Juanita Shaw Referring Phys: [id number] XIKA ZHAO  Sonographer Comments: Technically difficult study due to poor echo windows and suboptimal subcostal window. IMPRESSIONS  1. Left ventricular ejection fraction, by estimation, is 60 to 65%. The left ventricle has normal function. The left ventricle has no regional wall motion abnormalities. Left ventricular diastolic parameters were normal.  2. Right ventricular systolic function is normal. The right ventricular size is normal.  3. The mitral valve is grossly normal. No evidence of mitral valve regurgitation. No evidence of mitral stenosis.  4. The aortic valve has an indeterminant number of cusps. Aortic valve regurgitation is not visualized. Aortic valve sclerosis is present, with no evidence of aortic valve stenosis.  5. Aortic Aortic root is within normal limits for age when indexed to body surface area. FINDINGS  Left Ventricle: Left ventricular ejection fraction, by estimation, is 60 to 65%. The left ventricle has normal function. The left ventricle has no regional wall motion abnormalities. The left ventricular internal cavity size was normal in size. There is  no left ventricular hypertrophy. Left ventricular diastolic parameters were normal. Right Ventricle: The right ventricular size is normal. No increase in right ventricular wall thickness. Right ventricular systolic function is normal. Left Atrium: Left atrial size was normal in size. Right Atrium: Right atrial size was normal in size. Pericardium: Trivial pericardial effusion is present. Mitral Valve: The mitral valve is grossly normal. No evidence of mitral valve regurgitation. No evidence of mitral valve stenosis. MV peak gradient,  7.6 mmHg. The mean mitral valve gradient is 4.0 mmHg. Tricuspid Valve: The tricuspid valve is  not well visualized. Tricuspid valve regurgitation is not demonstrated. No evidence of tricuspid stenosis. Aortic Valve: The aortic valve has an indeterminant number of cusps. Aortic valve regurgitation is not visualized. Aortic valve sclerosis is present, with no evidence of aortic valve stenosis. Aortic valve mean gradient measures 8.0 mmHg. Aortic valve peak gradient measures 12.8 mmHg. Aortic valve area, by VTI measures 2.13 cm. Pulmonic Valve: The pulmonic valve was not well visualized. Pulmonic valve regurgitation is not visualized. No evidence of pulmonic stenosis. Aorta: Aortic root is within normal limits for age when indexed to body surface area. Venous: The inferior vena cava was not well visualized. IAS/Shunts: The interatrial septum was not well visualized.  LEFT VENTRICLE PLAX 2D LVIDd:         4.70 cm      Diastology LVIDs:         3.30 cm      LV e' medial:    7.29 cm/s LV PW:         1.00 cm      LV E/e' medial:  10.4 LV IVS:        1.00 cm      LV e' lateral:   7.62 cm/s LVOT diam:     2.10 cm      LV E/e' lateral: 10.0 LV SV:         63 LV SV Index:   31 LVOT Area:     3.46 cm  LV Volumes (MOD) LV vol d, MOD A2C: 145.0 ml LV vol d, MOD A4C: 164.0 ml LV vol s, MOD A2C: 70.8 ml LV vol s, MOD A4C: 57.5 ml LV SV MOD A2C:     74.2 ml LV SV MOD A4C:     164.0 ml LV SV MOD BP:      91.9 ml RIGHT VENTRICLE RV Basal diam:  4.00 cm RV Mid diam:    2.40 cm RV S prime:     17.30 cm/s TAPSE (M-mode): 2.6 cm LEFT ATRIUM             Index        RIGHT ATRIUM          Index LA diam:        4.40 cm 2.12 cm/m   RA Area:     8.54 cm LA Vol (A2C):   61.5 ml 29.63 ml/m  RA Volume:   14.40 ml 6.94 ml/m LA Vol (A4C):   60.2 ml 29.01 ml/m LA Biplane Vol: 62.1 ml 29.92 ml/m  AORTIC VALVE                     PULMONIC VALVE AV Area (Vmax):    1.95 cm      PV Vmax:       1.11 m/s AV Area (Vmean):   1.73 cm      PV Peak  grad:  4.9 mmHg AV Area (VTI):     2.13 cm AV Vmax:           179.00 cm/s AV Vmean:          129.000 cm/s AV VTI:            0.298 m AV Peak Grad:      12.8 mmHg AV Mean Grad:      8.0 mmHg LVOT Vmax:         101.00 cm/s LVOT Vmean:        64.500 cm/s LVOT VTI:  0.183 m LVOT/AV VTI ratio: 0.61  AORTA Ao Root diam: 4.10 cm Ao Asc diam:  3.30 cm MITRAL VALVE MV Area (PHT): 3.55 cm     SHUNTS MV Area VTI:   3.04 cm     Systemic VTI:  0.18 m MV Peak grad:  7.6 mmHg     Systemic Diam: 2.10 cm MV Mean grad:  4.0 mmHg MV Vmax:       1.38 m/s MV Vmean:      87.4 cm/s MV E velocity: 75.90 cm/s MV A velocity: 127.00 cm/s MV E/A ratio:  0.60 Sunit Tolia Electronically signed by Olinda Bertrand Signature Date/Time: 11/18/2023/6:05:37 PM    Final    DG CHEST PORT 1 VIEW Result Date: 11/18/2023 CLINICAL DATA:  Fever. EXAM: PORTABLE CHEST 1 VIEW COMPARISON:  02/07/2023. FINDINGS: The heart size and mediastinal contours are within normal limits. Asymmetric elevation of the right hemidiaphragm. No focal consolidation, pleural effusion, or pneumothorax. Partially visualized cervical fusion hardware. No acute osseous abnormality. IMPRESSION: Asymmetric elevation of the right hemidiaphragm. Otherwise, no acute cardiopulmonary findings. Electronically Signed   By: Mannie Seek M.D.   On: 11/18/2023 13:34    Patient Profile     89 y.o. male with a hx of paroxysmal A fib, HLD, osteoarthritis, ITP, prostate cancer, CVA,  who is being seen 11/18/2023 for the evaluation of A fib RVR at the request of Dr Lucienne Ryder.   Assessment & Plan    Postoperative atrial fibrillation with RVR Hypotension Acute blood loss anemia Patient has a history of PAF in the past and chronically on Eliquis  5 mg twice daily as well as as needed diltiazem  30 mg nightly He tells me he has not had any A-fib recently Suspect that recurrent A-fib due to high catecholamine state in the setting of recent hip surgery complicated by acute blood loss  anemia as well as fever with temp 102 on 11/17/2023 Hypotension resolved with IV fluid and PRBC transfusions Converted to sinus rhythm last night Hypotension likely multifactorial from acute blood loss anemia/prerenal state.  Fever and elevated WBC also concerning for possible underlying infection>> he has been started on vancomycin empirically Hemoglobin stable at 8.9 today Continue apixaban  5 mg twice daily and Cardizem  30 mg every 6 hours as needed for breakthrough A-fib   Chronic left bundle branch block Mild LV dysfunction 2D echo 08/11/2018 showed EF 50 to 55% with mild HK of basilar to mid anterior septal segment likely related to left bundle branch block Nuclear stress test 06/2008 normal 2D echo 4/28 showed normal LV function EF 60 to 65% with diastolic parameters.  No significant valvular heart disease   [facility] will sign off.   Medication Recommendations: Apixaban  5 mg twice daily and Cardizem  30 mg every 6 hours as needed for breakthrough palpitations Other recommendations (labs, testing, etc): None Follow up as an outpatient: Has an appointment with Carson Clara, MD on 01/16/2024  For questions or updates, please contact [facility] Please consult [email or web address] for contact info under        Signed, Gaylyn Keas, MD  11/19/2023, 12:08 PM

## 2023-11-20 LAB — CBC WITH DIFFERENTIAL/PLATELET
Abs Immature Granulocytes: 0.26 10*3/uL — ABNORMAL HIGH (ref 0.00–0.07)
Basophils Absolute: 0 10*3/uL (ref 0.0–0.1)
Basophils Relative: 0 %
Eosinophils Absolute: 0.1 10*3/uL (ref 0.0–0.5)
Eosinophils Relative: 1 %
HCT: 23.6 % — ABNORMAL LOW (ref 39.0–52.0)
Hemoglobin: 7.6 g/dL — ABNORMAL LOW (ref 13.0–17.0)
Immature Granulocytes: 3 %
Lymphocytes Relative: 15 %
Lymphs Abs: 1.5 10*3/uL (ref 0.7–4.0)
MCH: 30.5 pg (ref 26.0–34.0)
MCHC: 32.2 g/dL (ref 30.0–36.0)
MCV: 94.8 fL (ref 80.0–100.0)
Monocytes Absolute: 2 10*3/uL — ABNORMAL HIGH (ref 0.1–1.0)
Monocytes Relative: 20 %
Neutro Abs: 6 10*3/uL (ref 1.7–7.7)
Neutrophils Relative %: 61 %
Platelets: 148 10*3/uL — ABNORMAL LOW (ref 150–400)
RBC: 2.49 MIL/uL — ABNORMAL LOW (ref 4.22–5.81)
RDW: 14.5 % (ref 11.5–15.5)
WBC: 9.7 10*3/uL (ref 4.0–10.5)
nRBC: 0.3 % — ABNORMAL HIGH (ref 0.0–0.2)

## 2023-11-20 LAB — HEMOGLOBIN AND HEMATOCRIT, BLOOD
HCT: 25.3 % — ABNORMAL LOW (ref 39.0–52.0)
Hemoglobin: 8.2 g/dL — ABNORMAL LOW (ref 13.0–17.0)

## 2023-11-20 LAB — BASIC METABOLIC PANEL WITH GFR
Anion gap: 8 (ref 5–15)
BUN: 28 mg/dL — ABNORMAL HIGH (ref 8–23)
CO2: 20 mmol/L — ABNORMAL LOW (ref 22–32)
Calcium: 7.3 mg/dL — ABNORMAL LOW (ref 8.9–10.3)
Chloride: 108 mmol/L (ref 98–111)
Creatinine, Ser: 0.83 mg/dL (ref 0.61–1.24)
GFR, Estimated: >60 mL/min (ref >60)
Glucose, Bld: 117 mg/dL — ABNORMAL HIGH (ref 70–99)
Potassium: 3.5 mmol/L (ref 3.5–5.1)
Sodium: 136 mmol/L (ref 135–145)

## 2023-11-20 LAB — PREPARE RBC (CROSSMATCH)

## 2023-11-20 MED ORDER — SODIUM CHLORIDE 0.9% IV SOLUTION: Freq: Once | INTRAVENOUS | Status: AC

## 2023-11-20 NOTE — Progress Notes (Signed)
 Patient ID: Chad Norris, male   DOB: 11-01-1931, 89 y.o.   MRN: [id number] The patient's vital signs are stable and overall he looks better.  His creatinine is back down to normal at 0.8.  However his hemoglobin is below 8 and I do feel it is appropriate to give him 1 more unit of blood.  He is slightly fatigued and we do not want him going back into A-fib with RVR.  His left thigh is still soft and there is minimal bloody drainage on the dressing.  Will transfuse 1 unit of blood today and have therapies to work with the patient in anticipation of being able to hopefully mobilize better over the next few days for eventually going home.

## 2023-11-20 NOTE — TOC Transition Note (Addendum)
 Transition of Care [facility]) - Discharge Note  Patient Details  Name: Chad Norris MRN: [id number] Date of Birth: Jun 14, 1932  [facility] CM/SW Contact:  Zenon Hilda, LCSW Phone Number: 11/20/2023, 10:20 AM  Clinical Narrative: CSW met with patient to confirm discharge plan. Patient will go home with HHPT, which was prearranged with [facility]. Patient has a rolling walker at home, so there are no DME needs at this time.  Addendum: CSW followed up with patient's daughter, Jurgen Satkowiak, regarding DME. Per daughter, the patient's walker at home is the patient's mother's old walker and needs to be replaced. Daughter aware family will need to private pay for a shower chair and/or raised toilet seat if they determine the patient will need those items at home. CSW made DME referral to Kaitlyn with [employer]. [employer] to deliver rolling walker to patient's room tomorrow.  Final next level of care: Home w Home Health Services Barriers to Discharge: No Barriers Identified  Patient Goals and CMS Choice Patient states their goals for this hospitalization and ongoing recovery are:: Discharge home with HHPT through [facility] CMS Medicare.gov Compare Post Acute Care list provided to:: Patient Choice offered to / list presented to : Patient  Discharge Plan and Services Additional resources added to the After Visit Summary for        DME Arranged: N/A DME Agency: NA HH Arranged: PT HH Agency: [facility] Representative spoke with at [facility] Agency: Prearranged in orthopedist's office  Social Drivers of Health (SDOH) Interventions SDOH Screenings   Food Insecurity: No Food Insecurity (11/15/2023)  Housing: Low Risk  (11/15/2023)  Transportation Needs: No Transportation Needs (11/15/2023)  Utilities: Not At Risk (11/15/2023)  Alcohol Screen: Low Risk  (10/26/2022)  Depression (PHQ2-9): Low Risk  (01/16/2023)  Financial Resource Strain: Low Risk  (10/26/2022)  Physical Activity:  Insufficiently Active (10/26/2022)  Social Connections: Socially Integrated (11/15/2023)  Stress: No Stress Concern Present (10/26/2022)  Tobacco Use: Low Risk  (11/15/2023)   Readmission Risk Interventions     No data to display

## 2023-11-20 NOTE — Progress Notes (Signed)
 Physical Therapy Treatment Patient Details Name: Chad Norris MRN: [id number] DOB: 02-28-1932 Today's Date: 11/20/2023   History of Present Illness Pt s/p L THR and with hx of afib, CVA and protstate CA    PT Comments  POD # 5 NO am PT blood transfusion PM session assisted OOB to amb was difficult.  General bed mobility comments: Increased time with cues for sequence and use of R LE to self assist.  Right lean.  Increased assist back to bed.General transfer comment: Required + 2 side by side assist from elevated bed onto B platform EVA walker.  Also asissted on/off BSC twice. General Gait Details: Heavy lean on B platform EVA walker.  Tolerated an increased distance of 20 feet.  Great difficulty advancing L LE.  MAX c/o fatigue.  Avg HR 94.  NO c/o dizziness.  Limited activity tolerance. Requires rest breaks between activity.   Pt still requiring + 2 assist due to profound weakness and c/o extreme fatigue. May need SNF.  Will consult LPT.     If plan is discharge home, recommend the following: A little help with walking and/or transfers;A little help with bathing/dressing/bathroom;Assistance with cooking/housework;Assist for transportation;Help with stairs or ramp for entrance   Can travel by private vehicle        Equipment Recommendations  Rolling walker (2 wheels)    Recommendations for Other Services       Precautions / Restrictions Precautions Precautions: Fall Precaution/Restrictions Comments: Post OP A Fib Restrictions Weight Bearing Restrictions Per Provider Order: No LLE Weight Bearing Per Provider Order: Weight bearing as tolerated     Mobility  Bed Mobility Overal bed mobility: Needs Assistance Bed Mobility: Supine to Sit, Sit to Supine     Supine to sit: Max assist, +2 for safety/equipment, HOB elevated Sit to supine: Max assist, +2 for physical assistance, +2 for safety/equipment   General bed mobility comments: Increased time with cues for sequence and  use of R LE to self assist.  Right lean.  Increased assist back to bed.    Transfers Overall transfer level: Needs assistance Equipment used: Bilateral platform walker Transfers: Sit to/from Stand Sit to Stand: Mod assist, Max assist           General transfer comment: Required + 2 side by side assist from elevated bed onto B platform EVA walker.  Also asissted on/off BSC twice.    Ambulation/Gait Ambulation/Gait assistance: Mod assist, Max assist, +2 physical assistance, +2 safety/equipment Gait Distance (Feet): 20 Feet Assistive device: Blanca Bunch Gait Pattern/deviations: Step-to pattern, Decreased step length - right, Decreased step length - left, Shuffle, Trunk flexed Gait velocity: decreased     General Gait Details: Heavy lean on B platform EVA walker.  Tolerated an increased distance of 20 feet.  MAX c/o fatigue.  Avg HR 94.  NO c/o dizziness.  Limited activity tolerance.   Stairs             Wheelchair Mobility     Tilt Bed    Modified Rankin (Stroke Patients Only)       Balance                                            Communication Communication Communication: Impaired Factors Affecting Communication: Hearing impaired  Cognition Arousal: Alert Behavior During Therapy: WFL for tasks assessed/performed   PT - Cognitive impairments: No apparent  impairments                       PT - Cognition Comments: AxO x 3 c/o extreme fatigue.  Pleasant/motivated Following commands: Intact      Cueing Cueing Techniques: Verbal cues, Gestural cues, Tactile cues  Exercises      General Comments        Pertinent Vitals/Pain Pain Assessment Pain Assessment: 0-10 Pain Location: L hip Pain Descriptors / Indicators: Aching, Sore, Operative site guarding Pain Intervention(s): Monitored during session, Patient requesting pain meds-RN notified, Ice applied, Repositioned    Home Living                          Prior  Function            PT Goals (current goals can now be found in the care plan section) Progress towards PT goals: Progressing toward goals    Frequency    7X/week      PT Plan      Co-evaluation              AM-PAC PT "6 Clicks" Mobility   Outcome Measure  Help needed turning from your back to your side while in a flat bed without using bedrails?: A Lot Help needed moving from lying on your back to sitting on the side of a flat bed without using bedrails?: A Lot Help needed moving to and from a bed to a chair (including a wheelchair)?: A Lot Help needed standing up from a chair using your arms (e.g., wheelchair or bedside chair)?: A Lot Help needed to walk in hospital room?: A Lot Help needed climbing 3-5 steps with a railing? : Total 6 Click Score: 11    End of Session Equipment Utilized During Treatment: Gait belt Activity Tolerance: Patient limited by fatigue Patient left: in bed;with bed alarm set;with call bell/phone within reach;with family/visitor present Nurse Communication: Mobility status PT Visit Diagnosis: Unsteadiness on feet (R26.81);Muscle weakness (generalized) (M62.81);Difficulty in walking, not elsewhere classified (R26.2);Pain Pain - Right/Left: Left Pain - part of body: Hip     Time: 3295-1884 PT Time Calculation (min) (ACUTE ONLY): 25 min  Charges:    $Gait Training: 8-22 mins $Therapeutic Activity: 8-22 mins PT General Charges $$ ACUTE PT VISIT: 1 Visit                     Bess Broody  PTA Acute  Rehabilitation Services Office M-F          [phone]

## 2023-11-21 LAB — TYPE AND SCREEN
ABO/RH(D): O POS
Antibody Screen: NEGATIVE
Unit division: 0
Unit division: 0
Unit division: 0
Unit division: 0

## 2023-11-21 LAB — BPAM RBC
Blood Product Expiration Date: 202505272359
Blood Product Expiration Date: 202505262359
Blood Product Expiration Date: 202505282359
Blood Product Expiration Date: 202505282359
ISSUE DATE / TIME: 202504301033
ISSUE DATE / TIME: 202504271351
ISSUE DATE / TIME: 202504281308
ISSUE DATE / TIME: 202504281622
Unit Type and Rh: 5100
Unit Type and Rh: 5100
Unit Type and Rh: 5100
Unit Type and Rh: 5100

## 2023-11-21 NOTE — TOC Progression Note (Signed)
 Transition of Care [facility]) - Progression Note   Patient Details  Name: Chad Norris MRN: [id number] Date of Birth: Dec 15, 1931  [facility] CM/SW Contact  Zenon Hilda, LCSW Phone Number: 11/21/2023, 10:32 AM  Clinical Narrative: CSW met with patient to confirm [employer] delivered rolling walker to room.  Barriers to Discharge: No Barriers Identified  Expected Discharge Plan and Services              DME Arranged: Walker rolling DME Agency: [employer] Date DME Agency Contacted: 11/20/23 Time DME Agency Contacted: 1344 Representative spoke with at DME Agency: Joleen Navy HH Arranged: PT HH Agency: [facility] Representative spoke with at [facility] Agency: Prearranged in orthopedist's office  Social Determinants of Health (SDOH) Interventions SDOH Screenings   Food Insecurity: No Food Insecurity (11/15/2023)  Housing: Low Risk  (11/15/2023)  Transportation Needs: No Transportation Needs (11/15/2023)  Utilities: Not At Risk (11/15/2023)  Alcohol Screen: Low Risk  (10/26/2022)  Depression (PHQ2-9): Low Risk  (01/16/2023)  Financial Resource Strain: Low Risk  (10/26/2022)  Physical Activity: Insufficiently Active (10/26/2022)  Social Connections: Socially Integrated (11/15/2023)  Stress: No Stress Concern Present (10/26/2022)  Tobacco Use: Low Risk  (11/15/2023)   Readmission Risk Interventions     No data to display

## 2023-11-21 NOTE — Progress Notes (Signed)
 Patient has been transferred to 3W Ortho Room 1329.

## 2023-11-21 NOTE — [place] FL2 (Signed)
 [place]  MEDICAID FL2 LEVEL OF CARE FORM     IDENTIFICATION  Patient Name: Chad Norris Birthdate: December 30, 1931 Sex: male Admission Date (Current Location): 11/15/2023  [facility] and [place] Number:  Producer, television/film/video and Address:  [facility],  [address], [place] [address]      Provider Number: [id number]  Attending Physician Name and Address:  [name],*  Relative Name and Phone Number:  Chrystian Lodato (spouse) Ph: [phone]    Current Level of Care: Hospital Recommended Level of Care: Skilled Nursing Facility Prior Approval Number:    Date Approved/Denied:   PASRR Number: [id number]  Discharge Plan: SNF    Current Diagnoses: Patient Active Problem List   Diagnosis Date Noted   Atrial fibrillation with RVR (HCC) 11/19/2023   Left bundle branch block 11/19/2023   Postprocedural hypotension 11/18/2023   Status post total replacement of left hip 11/15/2023   Paroxysmal atrial fibrillation (HCC) 01/15/2023   Unilateral primary osteoarthritis, left hip 10/31/2022   Trochanteric bursitis, left hip 10/31/2022   Insect bite of left lower leg 10/29/2022   Tick bite of left lower leg 10/29/2022   Pain of left lower extremity 10/29/2022   Cellulitis of left lower leg 10/29/2022   Urine frequency 06/06/2022   Thrombocytopenia (HCC) 06/06/2021   Overactive bladder 06/05/2021   Skin lesions 01/25/2021   Nephrolithiasis    COVID-19    Cervical radiculopathy 06/11/2019   Sensorineural hearing loss, bilateral 05/21/2019   History of CVA (cerebrovascular accident) 08/18/2018   Hyperlipidemia 03/27/2018   Basal cell carcinoma (BCC) of [place] 03/11/2017   Prediabetes 03/11/2017   DDD (degenerative disc disease), lumbosacral 01/13/2014   PROSTATE CANCER, HX OF 06/21/2008   History of colonic polyps 06/21/2008   NEPHROLITHIASIS, HX OF 05/07/2008   History of gout 10/09/2007    Orientation RESPIRATION BLADDER Height & Weight      Self, Time, Situation, Place  Normal Continent Weight: 188 lb (85.3 kg) Height:  6' (182.9 cm)  BEHAVIORAL SYMPTOMS/MOOD NEUROLOGICAL BOWEL NUTRITION STATUS      Continent Diet (Regular diet)  AMBULATORY STATUS COMMUNICATION OF NEEDS Skin   Extensive Assist Verbally Surgical wounds (Left hip)                       Personal Care Assistance Level of Assistance  Bathing, Feeding, Dressing Bathing Assistance: Limited assistance Feeding assistance: Independent Dressing Assistance: Limited assistance     Functional Limitations Info  Sight, Hearing, Speech Sight Info: Impaired Hearing Info: Impaired Speech Info: Adequate    SPECIAL CARE FACTORS FREQUENCY  PT (By licensed PT), OT (By licensed OT)     PT Frequency: 5x's/week OT Frequency: 5x's/week            Contractures Contractures Info: Not present    Additional Factors Info  Code Status, Allergies Code Status Info: Full Allergies Info: NKA           Current Medications (11/21/2023):  This is the current hospital active medication list Current Facility-Administered Medications  Medication Dose Route Frequency Provider Last Rate Last Admin   0.9 %  sodium chloride  infusion   Intravenous Continuous [name], MD   Stopped at 11/20/23 1011   acetaminophen  (TYLENOL ) tablet 325-650 mg  325-650 mg Oral Q6H PRN [name], MD   650 mg at 11/21/23 0913   alum & mag hydroxide-simeth (MAALOX/MYLANTA) 200-200-20 MG/5ML suspension 30 mL  30 mL Oral Q4H PRN [name],  MD   30 mL at 11/18/23 1527   apixaban  (ELIQUIS ) tablet 5 mg  5 mg Oral BID [name], MD   5 mg at 11/21/23 0913   bisacodyl  (DULCOLAX) suppository 10 mg  10 mg Rectal Daily PRN [name], MD   10 mg at 11/18/23 1410   Chlorhexidine  Gluconate Cloth 2 % PADS 6 each  6 each Topical Daily [name], MD   6 each at 11/18/23 1026   diltiazem  (CARDIZEM ) tablet 30 mg  30 mg Oral Q6H PRN  [name], MD       diphenhydrAMINE  (BENADRYL ) 12.5 MG/5ML elixir 12.5-25 mg  12.5-25 mg Oral Q4H PRN [name], MD       docusate sodium  (COLACE) capsule 100 mg  100 mg Oral BID [name], MD   100 mg at 11/21/23 0913   feeding supplement (ENSURE ENLIVE / ENSURE PLUS) liquid 237 mL  237 mL Oral BID BM [name], MD   237 mL at 11/21/23 0914   HYDROcodone -acetaminophen  (NORCO) 7.5-325 MG per tablet 1-2 tablet  1-2 tablet Oral Q4H PRN [name], MD   1 tablet at 11/20/23 0701   HYDROcodone -acetaminophen  (NORCO/VICODIN) 5-325 MG per tablet 1-2 tablet  1-2 tablet Oral Q4H PRN [name], MD   1 tablet at 11/20/23 2124   menthol -cetylpyridinium (CEPACOL) lozenge 3 mg  1 lozenge Oral PRN [name], MD       Or   phenol (CHLORASEPTIC) mouth spray 1 spray  1 spray Mouth/Throat PRN [name], MD       methocarbamol  (ROBAXIN ) tablet 500 mg  500 mg Oral Q6H PRN [name], MD   500 mg at 11/18/23 0804   Or   methocarbamol  (ROBAXIN ) injection 500 mg  500 mg Intravenous Q6H PRN [name], MD   500 mg at 11/18/23 1542   metoCLOPramide  (REGLAN ) tablet 5-10 mg  5-10 mg Oral Q8H PRN [name], MD       Or   metoCLOPramide  (REGLAN ) injection 5-10 mg  5-10 mg Intravenous Q8H PRN [name], MD       morphine  (PF) 2 MG/ML injection 0.5-1 mg  0.5-1 mg Intravenous Q2H PRN [name], MD   1 mg at 11/18/23 7829   ondansetron  (ZOFRAN ) tablet 4 mg  4 mg Oral Q6H PRN [name], MD       Or   ondansetron  (ZOFRAN ) injection 4 mg  4 mg Intravenous Q6H PRN [name], MD   4 mg at 11/19/23 1946   Oral care mouth rinse  15 mL Mouth Rinse PRN [name], MD       pantoprazole  (PROTONIX ) EC tablet 40 mg  40 mg Oral Daily [name], MD   40 mg at 11/21/23 0913   polyethylene glycol (MIRALAX  /  GLYCOLAX ) packet 17 g  17 g Oral Daily PRN [name], MD   17 g at 11/18/23 0804     Discharge Medications: Please see discharge summary for a list of discharge medications.  Relevant Imaging Results:  Relevant Lab Results:   Additional Information SSN: [id number]  Zenon Hilda, LCSW

## 2023-11-21 NOTE — Consult Note (Addendum)
 Urology Consult Note   Requesting Attending Physician:  [name] Service Providing Consult: Urology  Consulting Attending: Dr. Freddi Jaeger   Reason for Consult:  penile edema and scrotal erythema/pain.  HPI: Chad Norris is seen in consultation for reasons noted above at the request of [name],*.  Patient is an 89 year old male presenting to [facility] emergency department for left total hip replacements with Dr. Lucienne Ryder and treatment of his osteoarthritis.  Procedure was completed 11/15/2023 and patient was discharged to the floor for monitoring and physical therapy.  During his hospitalization he has developed some penile edema. He denies trauma, pain, or itching. He is able to urinate.  ------------------  Assessment:  89 y.o. male with penial edema   Recommendations: #swollen penis #scrotal erythema and pain  Patient reports that he had had some penile swelling for several days. It was initially significant enough to where he could not utilize a urinal.  This has reduced by over 50% without intervention per his report.  Sometimes gentleman develop penile edema post procedurally due to fluid imbalance. This should improve as he becomes more ambulatory.  Most concerning would be paraphimosis since he is not circumcised but based on the fact that he has no pain, no signs of ischemia, and the swelling has gone down significantly without intervention, I think this is much less likely.  There is nothing abnormal about the scrotum.  It hurt because he was sitting on it.  Keep penis elevated. No surgical indication.  Urology will sign off.  Please feel free to have patient follow-up outpt if he has ongoing difficulty.  Reach out if he has an acute change. Thank you for this interesting consult.    Case and plan discussed with Dr. Freddi Jaeger  Past Medical History: Past Medical History:  Diagnosis Date   Arthritis    Atrial fibrillation (HCC)    Colon polyps     COVID-19    Diverticulosis    Gout    Hemorrhoids    Hyperlipemia    Nephrolithiasis    Pneumonia    Prostate cancer (HCC)    Stroke [facility])     Past Surgical History:  Past Surgical History:  Procedure Laterality Date   CATARACT EXTRACTION W/ INTRAOCULAR LENS  IMPLANT, BILATERAL     bi-lat   CERVICAL SPINE SURGERY     COLONOSCOPY W/ POLYPECTOMY     Epidural steroid injections     3-4, Dr Ellery Guthrie   RETROPUBIC PROSTATECTOMY     SHOULDER SURGERY     left   TOTAL HIP ARTHROPLASTY Left 11/15/2023   Procedure: ARTHROPLASTY, HIP, TOTAL, ANTERIOR APPROACH;  Surgeon: [name], MD;  Location: [facility] ORS;  Service: Orthopedics;  Laterality: Left;    Medication: Current Facility-Administered Medications  Medication Dose Route Frequency Provider Last Rate Last Admin   0.9 %  sodium chloride  infusion   Intravenous Continuous [name], MD   Stopped at 11/20/23 1011   acetaminophen  (TYLENOL ) tablet 325-650 mg  325-650 mg Oral Q6H PRN [name], MD   650 mg at 11/21/23 0913   alum & mag hydroxide-simeth (MAALOX/MYLANTA) 200-200-20 MG/5ML suspension 30 mL  30 mL Oral Q4H PRN [name], MD   30 mL at 11/18/23 1527   apixaban  (ELIQUIS ) tablet 5 mg  5 mg Oral BID [name], MD   5 mg at 11/21/23 0913   bisacodyl  (DULCOLAX) suppository 10 mg  10 mg Rectal Daily PRN [name],  MD   10 mg at 11/18/23 1410   Chlorhexidine  Gluconate Cloth 2 % PADS 6 each  6 each Topical Daily [name], MD   6 each at 11/18/23 1026   diltiazem  (CARDIZEM ) tablet 30 mg  30 mg Oral Q6H PRN [name], MD       diphenhydrAMINE  (BENADRYL ) 12.5 MG/5ML elixir 12.5-25 mg  12.5-25 mg Oral Q4H PRN [name], MD       docusate sodium  (COLACE) capsule 100 mg  100 mg Oral BID [name], MD   100 mg at 11/21/23 0913   feeding supplement (ENSURE ENLIVE / ENSURE PLUS) liquid 237 mL  237 mL Oral BID BM  [name], MD   237 mL at 11/21/23 0914   HYDROcodone -acetaminophen  (NORCO) 7.5-325 MG per tablet 1-2 tablet  1-2 tablet Oral Q4H PRN [name], MD   1 tablet at 11/20/23 0701   HYDROcodone -acetaminophen  (NORCO/VICODIN) 5-325 MG per tablet 1-2 tablet  1-2 tablet Oral Q4H PRN [name], MD   1 tablet at 11/20/23 2124   menthol -cetylpyridinium (CEPACOL) lozenge 3 mg  1 lozenge Oral PRN [name], MD       Or   phenol (CHLORASEPTIC) mouth spray 1 spray  1 spray Mouth/Throat PRN [name], MD       methocarbamol  (ROBAXIN ) tablet 500 mg  500 mg Oral Q6H PRN [name], MD   500 mg at 11/18/23 0454   Or   methocarbamol  (ROBAXIN ) injection 500 mg  500 mg Intravenous Q6H PRN [name], MD   500 mg at 11/18/23 1542   metoCLOPramide  (REGLAN ) tablet 5-10 mg  5-10 mg Oral Q8H PRN [name], MD       Or   metoCLOPramide  (REGLAN ) injection 5-10 mg  5-10 mg Intravenous Q8H PRN [name], MD       morphine  (PF) 2 MG/ML injection 0.5-1 mg  0.5-1 mg Intravenous Q2H PRN [name], MD   1 mg at 11/18/23 0981   ondansetron  (ZOFRAN ) tablet 4 mg  4 mg Oral Q6H PRN [name], MD       Or   ondansetron  (ZOFRAN ) injection 4 mg  4 mg Intravenous Q6H PRN [name], MD   4 mg at 11/19/23 1946   Oral care mouth rinse  15 mL Mouth Rinse PRN [name], MD       pantoprazole  (PROTONIX ) EC tablet 40 mg  40 mg Oral Daily [name], MD   40 mg at 11/21/23 0913   polyethylene glycol (MIRALAX  / GLYCOLAX ) packet 17 g  17 g Oral Daily PRN [name], MD   17 g at 11/18/23 0804    Allergies: No Known Allergies  Social History: Social History   Tobacco Use   Smoking status: Never   Smokeless tobacco: Never  Vaping Use   Vaping status: Never Used  Substance Use Topics   Alcohol use: Not Currently    Alcohol/week: 1.0  standard drink of alcohol    Types: 1 Cans of beer per week   Drug use: No    Family History Family History  Problem Relation Age of Onset   Kidney cancer Father    Colon cancer Father    Cancer Brother        ?   Heart attack Brother 55   Heart attack Sister 56   Breast cancer Sister    Diabetes Brother  Alcohol abuse Brother    Colon polyps Brother     Review of Systems  Genitourinary:  Negative for dysuria, flank pain, frequency, hematuria and urgency.     Objective   Vital signs in last 24 hours: BP 128/66 (BP Location: Left Arm)   Pulse 75   Temp 99.5 F (37.5 C) (Oral)   Resp 19   Ht 6' (1.829 m)   Wt 85.3 kg   SpO2 99%   BMI 25.50 kg/m   Physical Exam General: A&O, resting, appropriate HEENT: [place]/AT Pulmonary: Normal work of breathing Cardiovascular: no cyanosis Abdomen: Soft, NTTP, nondistended GU: normal scrotum, edematous penis.  Neuro: Appropriate, no focal neurological deficits  Most Recent Labs: Lab Results  Component Value Date   WBC 9.7 11/20/2023   HGB 8.2 (L) 11/20/2023   HCT 25.3 (L) 11/20/2023   PLT 148 (L) 11/20/2023    Lab Results  Component Value Date   NA 136 11/20/2023   K 3.5 11/20/2023   CL 108 11/20/2023   CO2 20 (L) 11/20/2023   BUN 28 (H) 11/20/2023   CREATININE 0.83 11/20/2023   CALCIUM  7.3 (L) 11/20/2023   MG 2.2 11/18/2023    Lab Results  Component Value Date   INR 1.0 01/09/2023   APTT 39 (H) 08/18/2018     Urine Culture: @[id number] (laburin,org,[id number],[id number])@   IMAGING: No results found.  ------  Alla Ar, NP Pager: [phone]   Please contact the urology consult pager with any further questions/concerns.   I have seen and examined the patient and agree with the above assessment and plan.  Patient with post-operative penile edema that is improving. Continue penoscrotal elevation and ice. Pt is voiding without difficulty. No suspicion for paraphimosis. Please call with  questions.  Matt R. Shields Pautz MD [facility]  Pager: [phone]

## 2023-11-21 NOTE — Plan of Care (Signed)
 Problem: Education: Goal: Knowledge of General Education information will improve Description: Including pain rating scale, medication(s)/side effects and non-pharmacologic comfort measures Outcome: Progressing   Problem: Health Behavior/Discharge Planning: Goal: Ability to manage health-related needs will improve Outcome: Progressing   Problem: Clinical Measurements: Goal: Ability to maintain clinical measurements within normal limits will improve Outcome: Progressing Goal: Will remain free from infection Outcome: Progressing Goal: Diagnostic test results will improve Outcome: Progressing Goal: Respiratory complications will improve Outcome: Progressing Goal: Cardiovascular complication will be avoided Outcome: Progressing   Problem: Activity: Goal: Risk for activity intolerance will decrease Outcome: Progressing   Problem: Nutrition: Goal: Adequate nutrition will be maintained Outcome: Progressing   Problem: Coping: Goal: Level of anxiety will decrease Outcome: Progressing   Problem: Elimination: Goal: Will not experience complications related to bowel motility Outcome: Progressing Goal: Will not experience complications related to urinary retention Outcome: Progressing   Problem: Pain Managment: Goal: General experience of comfort will improve and/or be controlled Outcome: Progressing   Problem: Safety: Goal: Ability to remain free from injury will improve Outcome: Progressing   Problem: Skin Integrity: Goal: Risk for impaired skin integrity will decrease Outcome: Progressing   Problem: Education: Goal: Knowledge of the prescribed therapeutic regimen will improve Outcome: Progressing Goal: Understanding of discharge needs will improve Outcome: Progressing Goal: Individualized Educational Video(s) Outcome: Progressing   Problem: Activity: Goal: Ability to avoid complications of mobility impairment will improve Outcome: Progressing Goal: Ability to  tolerate increased activity will improve Outcome: Progressing   Problem: Clinical Measurements: Goal: Postoperative complications will be avoided or minimized Outcome: Progressing   Problem: Pain Management: Goal: Pain level will decrease with appropriate interventions Outcome: Progressing   Problem: Skin Integrity: Goal: Will show signs of wound healing Outcome: Progressing   Problem: Education: Goal: Knowledge of General Education information will improve Description: Including pain rating scale, medication(s)/side effects and non-pharmacologic comfort measures Outcome: Progressing   Problem: Health Behavior/Discharge Planning: Goal: Ability to manage health-related needs will improve Outcome: Progressing   Problem: Clinical Measurements: Goal: Ability to maintain clinical measurements within normal limits will improve Outcome: Progressing Goal: Will remain free from infection Outcome: Progressing Goal: Diagnostic test results will improve Outcome: Progressing Goal: Respiratory complications will improve Outcome: Progressing Goal: Cardiovascular complication will be avoided Outcome: Progressing   Problem: Activity: Goal: Risk for activity intolerance will decrease Outcome: Progressing   Problem: Nutrition: Goal: Adequate nutrition will be maintained Outcome: Progressing   Problem: Coping: Goal: Level of anxiety will decrease Outcome: Progressing   Problem: Elimination: Goal: Will not experience complications related to bowel motility Outcome: Progressing Goal: Will not experience complications related to urinary retention Outcome: Progressing   Problem: Pain Managment: Goal: General experience of comfort will improve and/or be controlled Outcome: Progressing   Problem: Safety: Goal: Ability to remain free from injury will improve Outcome: Progressing   Problem: Skin Integrity: Goal: Risk for impaired skin integrity will decrease Outcome: Progressing    Problem: Education: Goal: Knowledge of the prescribed therapeutic regimen will improve Outcome: Progressing Goal: Understanding of discharge needs will improve Outcome: Progressing Goal: Individualized Educational Video(s) Outcome: Progressing   Problem: Activity: Goal: Ability to avoid complications of mobility impairment will improve Outcome: Progressing Goal: Ability to tolerate increased activity will improve Outcome: Progressing   Problem: Clinical Measurements: Goal: Postoperative complications will be avoided or minimized Outcome: Progressing   Problem: Pain Management: Goal: Pain level will decrease with appropriate interventions Outcome: Progressing   Problem: Skin Integrity: Goal: Will show signs of wound healing Outcome: Progressing

## 2023-11-21 NOTE — Progress Notes (Signed)
 Physical Therapy Treatment Patient Details Name: Chad Norris MRN: [id number] DOB: Jan 30, 1932 Today's Date: 11/21/2023   History of Present Illness Pt s/p L THR and with hx of afib, CVA and protstate CA    PT Comments  POD # 4 am session AxO x 3 pleasant and willing.  Progressing slowly.  Max c/o weakness/fatigue.  Assisted OOB was difficult.  General bed mobility comments: Increased time with cues for sequence and use of R LE to self assist.  used bed pad to complete scooting to EOB. General transfer comment: VC's on proper hand placement and required + 2 side by side assist to rise.  Uncontolled sit into recliner due to fatigue. General Gait Details: required + 2 asisst with recliner following and heavy lean on walker.  Used RW this session vs EVA.  Progressing.  Very limited activity tolerance with MAX c/o weakness/fatigue.  Unsteady with uneven weght shift shift.  HIGH FALL RISK. Then returned to room to perform some TE's following HEP handout.  Instructed on proper tech, freq as well as use of ICE.   Discussed with Dr Lucienne Ryder who was present during session, Pt may need ST Rehab [facility].  Will update LPT.    If plan is discharge home, recommend the following: A little help with walking and/or transfers;A little help with bathing/dressing/bathroom;Assistance with cooking/housework;Assist for transportation;Help with stairs or ramp for entrance   Can travel by private vehicle        Equipment Recommendations  Rolling walker (2 wheels)    Recommendations for Other Services       Precautions / Restrictions Precautions Precautions: Fall Precaution/Restrictions Comments: Post OP A Fib Restrictions Weight Bearing Restrictions Per Provider Order: No LLE Weight Bearing Per Provider Order: Weight bearing as tolerated     Mobility  Bed Mobility Overal bed mobility: Needs Assistance Bed Mobility: Supine to Sit     Supine to sit: Max assist, +2 for safety/equipment, HOB elevated      General bed mobility comments: Increased time with cues for sequence and use of R LE to self assist.  used bed pad to complete scooting to EOB.    Transfers Overall transfer level: Needs assistance Equipment used: Rolling walker (2 wheels) Transfers: Sit to/from Stand Sit to Stand: Max assist, +2 physical assistance, +2 safety/equipment           General transfer comment: VC's on proper hand placement and required + 2 side by side assist to rise.  Uncontolled sit into recliner due to fatigue.    Ambulation/Gait Ambulation/Gait assistance: Mod assist, Max assist, +2 physical assistance, +2 safety/equipment Gait Distance (Feet): 28 Feet Assistive device: Rolling walker (2 wheels) Gait Pattern/deviations: Step-to pattern, Decreased step length - right, Decreased step length - left, Shuffle, Trunk flexed Gait velocity: decreased     General Gait Details: required + 2 asisst with recliner following and heavy lean on walker.  Used RW this session vs EVA.  Progressing.  Very limited activity tolerance with MAX c/o weakness/fatigue.  Unsteady with uneven weght shift shift.  HIGH FALL RISK.   Stairs             Wheelchair Mobility     Tilt Bed    Modified Rankin (Stroke Patients Only)       Balance  Communication Communication Communication: Impaired Factors Affecting Communication: Hearing impaired  Cognition Arousal: Alert Behavior During Therapy: WFL for tasks assessed/performed   PT - Cognitive impairments: No apparent impairments                       PT - Cognition Comments: AxO x 3 c/o extreme fatigue.  Pleasant/motivated Following commands: Intact      Cueing Cueing Techniques: Verbal cues, Gestural cues, Tactile cues  Exercises  Total Hip Replacement TE's following HEP Handout 10 reps ankle pumps 05 reps knee presses 05 reps heel slides 05 reps SAQ's 05 reps ABD Instructed  how to use a belt loop to assist  Followed by ICE         Pertinent Vitals/Pain Pain Assessment Pain Assessment: 0-10 Pain Score: 5  Pain Location: L hip Pain Descriptors / Indicators: Aching, Sore, Tender, Operative site guarding Pain Intervention(s): Monitored during session, Premedicated before session, Repositioned, Ice applied    Home Living Family/patient expects to be discharged to:: Private residence Living Arrangements: Spouse/significant other Available Help at Discharge: Family Type of Home: House Home Access: Stairs to enter Entrance Stairs-Rails: Right Entrance Stairs-Number of Steps: 3 Alternate Level Stairs-Number of Steps: 14 Home Layout: Two level;Able to live on main level with bedroom/bathroom Home Equipment: Jeananne Mighty - single point Additional Comments: office on second floor    Prior Function            PT Goals (current goals can now be found in the care plan section) Progress towards PT goals: Progressing toward goals    Frequency    7X/week      PT Plan      Co-evaluation              AM-PAC PT "6 Clicks" Mobility   Outcome Measure  Help needed turning from your back to your side while in a flat bed without using bedrails?: A Lot Help needed moving from lying on your back to sitting on the side of a flat bed without using bedrails?: A Lot Help needed moving to and from a bed to a chair (including a wheelchair)?: A Lot Help needed standing up from a chair using your arms (e.g., wheelchair or bedside chair)?: A Lot Help needed to walk in hospital room?: A Lot Help needed climbing 3-5 steps with a railing? : Total 6 Click Score: 11    End of Session Equipment Utilized During Treatment: Gait belt Activity Tolerance: Patient limited by fatigue Patient left: in chair;with call bell/phone within reach Nurse Communication: Mobility status PT Visit Diagnosis: Unsteadiness on feet (R26.81);Muscle weakness (generalized) (M62.81);Difficulty  in walking, not elsewhere classified (R26.2);Pain Pain - Right/Left: Left Pain - part of body: Hip     Time: 8295-6213 PT Time Calculation (min) (ACUTE ONLY): 29 min  Charges:    $Gait Training: 8-22 mins $Therapeutic Exercise: 8-22 mins PT General Charges $$ ACUTE PT VISIT: 1 Visit                    Bess Broody  PTA Acute  Rehabilitation Services Office M-F          [phone]

## 2023-11-21 NOTE — Progress Notes (Signed)
 Patient ID: Chad Norris, male   DOB: 07-21-1932, 89 y.o.   MRN: [id number] The patient is awake and alert this morning.  He is fatigued.  His vital signs are stable.  His left hip dressing is clean and dry and his thigh is soft.  PT is getting ready to work with him and I have ordered an OT consult.  From looking at the notes and from therapies recommendation thus far, it sounds like he is going to need short-term skilled nursing.  We will consult the transitional care team for looking at short-term skilled nursing options and possibilities.

## 2023-11-21 NOTE — TOC Progression Note (Signed)
 Transition of Care [facility]) - Progression Note   Patient Details  Name: Chad Norris MRN: [id number] Date of Birth: 14-Jun-1932  [facility]) CM/SW Contact  Zenon Hilda, LCSW Phone Number: 11/21/2023, 1:27 PM  Clinical Narrative: [facility] consulted for SNF due to slow progression with PT. Patient and family are agreeable to SNF and would prefer [facility] or [facility] if possible. FL2 done; PASRR received. Initial referral faxed out. TOC awaiting bed offers.  Barriers to Discharge: SNF Pending bed offer, Insurance Authorization  Expected Discharge Plan and Services In-house Referral: Clinical Social Work Post Acute Care Choice: Skilled Nursing Facility              DME Arranged: Otho Blitz rolling DME Agency: [employer] Date DME Agency Contacted: 11/20/23 Time DME Agency Contacted: 1344 Representative spoke with at DME Agency: Joleen Navy HH Arranged: PT HH Agency: [facility] Representative spoke with at [facility] Agency: Prearranged in orthopedist's office  Social Determinants of Health (SDOH) Interventions SDOH Screenings   Food Insecurity: No Food Insecurity (11/15/2023)  Housing: Low Risk  (11/15/2023)  Transportation Needs: No Transportation Needs (11/15/2023)  Utilities: Not At Risk (11/15/2023)  Alcohol Screen: Low Risk  (10/26/2022)  Depression (PHQ2-9): Low Risk  (01/16/2023)  Financial Resource Strain: Low Risk  (10/26/2022)  Physical Activity: Insufficiently Active (10/26/2022)  Social Connections: Socially Integrated (11/15/2023)  Stress: No Stress Concern Present (10/26/2022)  Tobacco Use: Low Risk  (11/15/2023)   Readmission Risk Interventions     No data to display

## 2023-11-21 NOTE — Evaluation (Signed)
 Occupational Therapy Evaluation Patient Details Name: Chad Norris MRN: [id number] DOB: [birth year] Today's Date: 11/21/2023   History of Present Illness   Pt s/p L THR and with hx of afib, CVA and protstate CA     Clinical Impressions PTA, patient lives at home with wife and was independent including driving except with significant L hip pain. Currently, patient presents with deficits outlined below (see OT Problem List for details) most significantly lightheadedness with standing (BP remained 122/68), L hip pain, LE weakness, overall decreased activity tolerance and balance deficits impacting BADL's (S-min a UB and total A for LB self care) and functional mobility. Patient will required continued follow up OT services and DME/AE as recommended.      If plan is discharge home, recommend the following:   Two people to help with walking and/or transfers;Two people to help with bathing/dressing/bathroom;Assistance with cooking/housework;Direct supervision/assist for medications management;Direct supervision/assist for financial management;Assist for transportation;Help with stairs or ramp for entrance;Supervision due to cognitive status     Functional Status Assessment   Patient has had a recent decline in their functional status and demonstrates the ability to make significant improvements in function in a reasonable and predictable amount of time.     Equipment Recommendations   BSC/3in1;Other (comment) (THA kit but both may be ordered at next venue if not d/c'd home from acute)      Precautions/Restrictions   Precautions Precautions: Fall Precaution/Restrictions Comments: Post OP A Fib Restrictions Weight Bearing Restrictions Per Provider Order: No LLE Weight Bearing Per Provider Order: Weight bearing as tolerated     Mobility Bed Mobility Overal bed mobility:  (patient remained up in recliner)                  Transfers Overall transfer level: Needs  assistance Equipment used: Rolling walker (2 wheels) Transfers: Sit to/from Stand Sit to Stand: Max assist, +2 physical assistance, +2 safety/equipment           General transfer comment: difficulty with powering up with poor standing tolerance,  +2 support, new RW in room, OT adjusted to height      Balance Overall balance assessment: Needs assistance Sitting-balance support: No upper extremity supported, Feet supported Sitting balance-Leahy Scale: Fair     Standing balance support: Bilateral upper extremity supported, Reliant on assistive device for balance Standing balance-Leahy Scale: Poor                             ADL either performed or assessed with clinical judgement   ADL Overall ADL's : Needs assistance/impaired Eating/Feeding: Set up   Grooming: Wash/dry hands;Wash/dry face;Oral care;Set up;Sitting   Upper Body Bathing: Minimal assistance;Sitting   Lower Body Bathing: Total assistance;+2 for physical assistance;+2 for safety/equipment;Sit to/from stand;Sitting/lateral leans;Cueing for sequencing   Upper Body Dressing : Minimal assistance;Sitting   Lower Body Dressing: Total assistance;+2 for physical assistance;+2 for safety/equipment;Sitting/lateral leans;Sit to/from stand   Toilet Transfer: Maximal assistance;+2 for physical assistance;+2 for safety/equipment;BSC/3in1;Rolling walker (2 wheels)   Toileting- Clothing Manipulation and Hygiene: Maximal assistance;Sit to/from stand;Sitting/lateral lean;Bed level Toileting - Clothing Manipulation Details (indicate cue type and reason): used urinal but no voiding from recliner level     Functional mobility during ADLs: Maximal assistance;+2 for physical assistance;+2 for safety/equipment General ADL Comments: introduced AE for LB dressing     Vision Baseline Vision/History: 0 No visual deficits Ability to See in Adequate Light: 0 Adequate Patient Visual Report: No change from  baseline Vision  Assessment?: No apparent visual deficits     Perception Perception: Within Functional Limits       Praxis Praxis: WFL       Pertinent Vitals/Pain Pain Assessment Pain Assessment: Faces Faces Pain Scale: Hurts little more Pain Location: L hip and scrotum Pain Descriptors / Indicators: Aching, Sore, Tender Pain Intervention(s): Monitored during session, Premedicated before session, Repositioned, Ice applied (elevation of scrotum and LE's)     Extremity/Trunk Assessment Upper Extremity Assessment Upper Extremity Assessment: Right hand dominant;Overall [facility] for tasks assessed   Lower Extremity Assessment Lower Extremity Assessment: LLE deficits/detail;Defer to PT evaluation   Cervical / Trunk Assessment Cervical / Trunk Assessment: Normal   Communication Communication Communication: Impaired Factors Affecting Communication: Hearing impaired   Cognition Arousal: Alert Behavior During Therapy: WFL for tasks assessed/performed Cognition: Cognition impaired   Orientation impairments: Time Awareness: Intellectual awareness impaired Memory impairment (select all impairments): Short-term memory Attention impairment (select first level of impairment): Sustained attention Executive functioning impairment (select all impairments): Reasoning, Problem solving, Sequencing OT - Cognition Comments: HOH impacts, delayed processing                 Following commands: Intact       Cueing  General Comments   Cueing Techniques: Verbal cues;Gestural cues;Tactile cues  scrotal/penile edema, nurse aware, OT provided elevation, ice applied to L hip over gown and L hip post op dressing intact           Home Living Family/patient expects to be discharged to:: Private residence Living Arrangements: Spouse/significant other Available Help at Discharge: Family Type of Home: House Home Access: Stairs to enter Secretary/administrator of Steps: 3 Entrance Stairs-Rails: Right Home  Layout: Two level;Able to live on main level with bedroom/bathroom Alternate Level Stairs-Number of Steps: 14 Alternate Level Stairs-Rails: Left Bathroom Shower/Tub: Walk-in shower;Door   [employer] Toilet: Standard Bathroom Accessibility: Yes How Accessible: Accessible via walker Home Equipment: Cane - single point   Additional Comments: office on second floor      Prior Functioning/Environment Prior Level of Function : Independent/Modified Independent;Driving                    OT Problem List: Decreased strength;Decreased activity tolerance;Impaired balance (sitting and/or standing);Decreased cognition;Decreased safety awareness;Decreased knowledge of use of DME or AE;Decreased knowledge of precautions;Cardiopulmonary status limiting activity;Pain;Increased edema   OT Treatment/Interventions: Self-care/ADL training;Therapeutic exercise;Energy conservation;DME and/or AE instruction;Therapeutic activities;Cognitive remediation/compensation;Patient/family education;Balance training      OT Goals(Current goals can be found in the care plan section)   Acute Rehab OT Goals Patient Stated Goal: to get stronger OT Goal Formulation: With patient/family Time For Goal Achievement: 12/05/23 Potential to Achieve Goals: Good ADL Goals Pt Will Perform Lower Body Bathing: with mod assist;sitting/lateral leans;sit to/from stand;with adaptive equipment Pt Will Perform Lower Body Dressing: sitting/lateral leans;sit to/from stand;with mod assist;with adaptive equipment Pt Will Transfer to Toilet: with mod assist;with +2 assist;stand pivot transfer;bedside commode;grab bars   OT Frequency:  Min 2X/week       AM-PAC OT "6 Clicks" Daily Activity     Outcome Measure Help from another person eating meals?: A Little Help from another person taking care of personal grooming?: A Little Help from another person toileting, which includes using toliet, bedpan, or urinal?: A Lot Help from  another person bathing (including washing, rinsing, drying)?: Total Help from another person to put on and taking off regular upper body clothing?: A Little Help from another person to put on and  taking off regular lower body clothing?: A Lot 6 Click Score: 14   End of Session Equipment Utilized During Treatment: Gait belt;Rolling walker (2 wheels) Nurse Communication: Mobility status  Activity Tolerance: Patient limited by fatigue;Patient limited by pain;Other (comment) (lightheadedness with no drop in BP) Patient left: in chair;with call bell/phone within reach;with chair alarm set;with family/visitor present  OT Visit Diagnosis: Unsteadiness on feet (R26.81);Muscle weakness (generalized) (M62.81);Cognitive communication deficit (R41.841);Pain                Time: 1610-9604 OT Time Calculation (min): 37 min Charges:  OT General Charges $OT Visit: 1 Visit OT Evaluation $OT Eval Moderate Complexity: 1 Mod OT Treatments $Self Care/Home Management : 8-22 mins  Meredith Kilbride OT/L Acute Rehabilitation Department  [phone]  11/21/2023, 2:43 PM

## 2023-11-22 DIAGNOSIS — I447 Left bundle-branch block, unspecified: Secondary | ICD-10-CM | POA: Diagnosis not present

## 2023-11-22 DIAGNOSIS — M109 Gout, unspecified: Secondary | ICD-10-CM | POA: Diagnosis not present

## 2023-11-22 DIAGNOSIS — G8929 Other chronic pain: Secondary | ICD-10-CM | POA: Diagnosis not present

## 2023-11-22 DIAGNOSIS — E785 Hyperlipidemia, unspecified: Secondary | ICD-10-CM | POA: Diagnosis not present

## 2023-11-22 DIAGNOSIS — N3281 Overactive bladder: Secondary | ICD-10-CM | POA: Diagnosis not present

## 2023-11-22 DIAGNOSIS — D696 Thrombocytopenia, unspecified: Secondary | ICD-10-CM | POA: Diagnosis not present

## 2023-11-22 DIAGNOSIS — Z471 Aftercare following joint replacement surgery: Secondary | ICD-10-CM | POA: Diagnosis not present

## 2023-11-22 DIAGNOSIS — M1612 Unilateral primary osteoarthritis, left hip: Secondary | ICD-10-CM | POA: Diagnosis not present

## 2023-11-22 DIAGNOSIS — I9581 Postprocedural hypotension: Secondary | ICD-10-CM | POA: Diagnosis not present

## 2023-11-22 DIAGNOSIS — Z7401 Bed confinement status: Secondary | ICD-10-CM | POA: Diagnosis not present

## 2023-11-22 DIAGNOSIS — M25559 Pain in unspecified hip: Secondary | ICD-10-CM | POA: Diagnosis not present

## 2023-11-22 DIAGNOSIS — R2689 Other abnormalities of gait and mobility: Secondary | ICD-10-CM | POA: Diagnosis not present

## 2023-11-22 DIAGNOSIS — M51379 Other intervertebral disc degeneration, lumbosacral region without mention of lumbar back pain or lower extremity pain: Secondary | ICD-10-CM | POA: Diagnosis not present

## 2023-11-22 DIAGNOSIS — I480 Paroxysmal atrial fibrillation: Secondary | ICD-10-CM | POA: Diagnosis not present

## 2023-11-22 DIAGNOSIS — H903 Sensorineural hearing loss, bilateral: Secondary | ICD-10-CM | POA: Diagnosis not present

## 2023-11-22 DIAGNOSIS — M6281 Muscle weakness (generalized): Secondary | ICD-10-CM | POA: Diagnosis not present

## 2023-11-22 DIAGNOSIS — I69828 Other speech and language deficits following other cerebrovascular disease: Secondary | ICD-10-CM | POA: Diagnosis not present

## 2023-11-22 DIAGNOSIS — R2681 Unsteadiness on feet: Secondary | ICD-10-CM | POA: Diagnosis not present

## 2023-11-22 DIAGNOSIS — Z96642 Presence of left artificial hip joint: Secondary | ICD-10-CM | POA: Diagnosis not present

## 2023-11-22 DIAGNOSIS — I69398 Other sequelae of cerebral infarction: Secondary | ICD-10-CM | POA: Diagnosis not present

## 2023-11-22 DIAGNOSIS — Z743 Need for continuous supervision: Secondary | ICD-10-CM | POA: Diagnosis not present

## 2023-11-22 MED ORDER — METHOCARBAMOL 500 MG PO TABS: 500.0000 mg | ORAL_TABLET | Freq: Four times a day (QID) | ORAL | 1 refills | Status: DC | PRN

## 2023-11-22 MED ORDER — HYDROCODONE-ACETAMINOPHEN 5-325 MG PO TABS
1.0000 | ORAL_TABLET | Freq: Four times a day (QID) | ORAL | 0 refills | Status: DC | PRN
Start: 2023-11-22 — End: 2024-03-18

## 2023-11-22 NOTE — Plan of Care (Signed)
  Problem: Elimination: Goal: Will not experience complications related to urinary retention Outcome: Progressing   Problem: Safety: Goal: Ability to remain free from injury will improve Outcome: Progressing   Problem: Pain Management: Goal: Pain level will decrease with appropriate interventions Outcome: Progressing

## 2023-11-22 NOTE — Progress Notes (Signed)
 Patient ID: Chad Norris, male   DOB: 08/16/1931, 89 y.o.   MRN: [id number] The patient is awake and alert this morning.  His vital signs are entirely stable.  His left operative hip is stable.  I changed the dressing and now there is no drainage from his left hip incision and a new dressing is applied.  From our standpoint, he can be discharged to skilled nursing if a bed is available today.

## 2023-11-22 NOTE — Plan of Care (Signed)
   Problem: Activity: Goal: Risk for activity intolerance will decrease Outcome: Progressing   Problem: Pain Managment: Goal: General experience of comfort will improve and/or be controlled Outcome: Progressing   Problem: Safety: Goal: Ability to remain free from injury will improve Outcome: Progressing

## 2023-11-22 NOTE — Progress Notes (Signed)
 Physical Therapy Treatment Patient Details Name: Chad Norris MRN: [id number] DOB: 1932/06/20 Today's Date: 11/22/2023   History of Present Illness Pt s/p L THR and with hx of afib, CVA and protstate CA    PT Comments  POD # 5 am session Pt AxO x 3 pleasant and feeling "better".  Assisted OOB was difficult.  General bed mobility comments: Increased time with cues for sequence and use of R LE to self assist.  used bed pad to complete scooting to EOB. General transfer comment: VC's on proper hand placement and required + 2 side by side assist to rise.  Also assisted with a toilet transfer. Assisted with a wash up due to urinal spillage while in bed.  Assisted with amb in hallway.  General Gait Details: assisted with amb to bathroom then a limited distance in hallway.  Unsteady uneven gait esp with turns.  Max c/o fatigue. Performed a few TE's followed by ICE. Pt is progressing slowly.  Pt will need ST Rehab at SNF to address mobility and functional decline prior to safely returning home.    If plan is discharge home, recommend the following: A little help with walking and/or transfers;A little help with bathing/dressing/bathroom;Assistance with cooking/housework;Assist for transportation;Help with stairs or ramp for entrance   Can travel by private vehicle        Equipment Recommendations  Rolling walker (2 wheels)    Recommendations for Other Services       Precautions / Restrictions Precautions Precautions: Fall Precaution/Restrictions Comments: Post OP A Fib Restrictions Weight Bearing Restrictions Per Provider Order: No LLE Weight Bearing Per Provider Order: Weight bearing as tolerated     Mobility  Bed Mobility Overal bed mobility: Needs Assistance Bed Mobility: Supine to Sit     Supine to sit: Max assist     General bed mobility comments: Increased time with cues for sequence and use of R LE to self assist.  used bed pad to complete scooting to EOB.     Transfers Overall transfer level: Needs assistance Equipment used: Rolling walker (2 wheels)   Sit to Stand: Mod assist           General transfer comment: VC's on proper hand placement and required + 2 side by side assist to rise.  Also assisted with a toilet transfer.    Ambulation/Gait Ambulation/Gait assistance: Mod assist Gait Distance (Feet): 34 Feet Assistive device: Rolling walker (2 wheels) Gait Pattern/deviations: Step-to pattern, Decreased step length - right, Decreased step length - left, Shuffle, Trunk flexed Gait velocity: decreased     General Gait Details: assisted with amb to bathroom then a limited distance in hallway.  Unsteady uneven gait esp with turns.  Max c/o fatigue.   Stairs             Wheelchair Mobility     Tilt Bed    Modified Rankin (Stroke Patients Only)       Balance                                            Communication Communication Communication: Impaired Factors Affecting Communication: Hearing impaired  Cognition Arousal: Alert Behavior During Therapy: WFL for tasks assessed/performed   PT - Cognitive impairments: No apparent impairments                       PT -  Cognition Comments: AxO x 3 feeling "better" Following commands: Intact      Cueing Cueing Techniques: Verbal cues, Gestural cues, Tactile cues  Exercises  Total Hip Replacement TE's following HEP Handout 10 reps ankle pumps 05 reps knee presses 05 reps heel slides 05 reps SAQ's 05 reps ABD Instructed how to use a belt loop to assist  Followed by ICE     General Comments        Pertinent Vitals/Pain Pain Assessment Pain Assessment: Faces Faces Pain Scale: Hurts little more Pain Location: L hip Pain Descriptors / Indicators: Aching, Sore, Tender, Operative site guarding Pain Intervention(s): Monitored during session, Repositioned, Patient requesting pain meds-RN notified, Ice applied    Home Living                           Prior Function            PT Goals (current goals can now be found in the care plan section) Progress towards PT goals: Progressing toward goals    Frequency    7X/week      PT Plan      Co-evaluation              AM-PAC PT "6 Clicks" Mobility   Outcome Measure  Help needed turning from your back to your side while in a flat bed without using bedrails?: A Lot Help needed moving from lying on your back to sitting on the side of a flat bed without using bedrails?: A Lot Help needed moving to and from a bed to a chair (including a wheelchair)?: A Lot Help needed standing up from a chair using your arms (e.g., wheelchair or bedside chair)?: A Lot Help needed to walk in hospital room?: A Lot Help needed climbing 3-5 steps with a railing? : Total 6 Click Score: 11    End of Session Equipment Utilized During Treatment: Gait belt Activity Tolerance: Patient limited by fatigue Patient left: in chair;with call bell/phone within reach Nurse Communication: Mobility status PT Visit Diagnosis: Unsteadiness on feet (R26.81);Muscle weakness (generalized) (M62.81);Difficulty in walking, not elsewhere classified (R26.2);Pain Pain - Right/Left: Left Pain - part of body: Hip     Time: 1610-9604 PT Time Calculation (min) (ACUTE ONLY): 27 min  Charges:    $Gait Training: 8-22 mins $Therapeutic Activity: 8-22 mins PT General Charges $$ ACUTE PT VISIT: 1 Visit                     {Fiorella Hanahan  PTA Acute  Rehabilitation Services Office M-F          [phone]

## 2023-11-22 NOTE — Care Management Important Message (Signed)
 Important Message  Patient Details IM Letter given. Name: Chad Norris MRN: [id number] Date of Birth: 12/16/1931   Important Message Given:  Yes - Medicare IM     Curtiss Dowdy 11/22/2023, 9:47 AM

## 2023-11-22 NOTE — TOC Transition Note (Signed)
 Transition of Care [facility]) - Discharge Note   Patient Details  Name: Chad Norris MRN: [id number] Date of Birth: [birth year]  [facility]) CM/SW Contact:  Delilah Fend, LCSW Phone Number: 11/22/2023, 1:52 PM   Clinical Narrative:     Have reviewed SNF bed offers with pt and wife and they have accepted bed at [facility].  Have secured insurance authorization and confirmed with facility that bed ready for admission today.  Pt medically cleared.  PTAR called at 2:00 pm.  RN to call report to [phone].  No further TOC needs.  Final next level of care: Skilled Nursing Facility Barriers to Discharge: Barriers Resolved   Patient Goals and CMS Choice Patient states their goals for this hospitalization and ongoing recovery are:: Discharge home with HHPT through [facility] CMS Medicare.gov Compare Post Acute Care list provided to:: Patient Choice offered to / list presented to : Patient      Discharge Placement PASRR number recieved: 11/20/23            Patient chooses bed at: [facility] Patient to be transferred to facility by: PTAR Name of family member notified: wife Patient and family notified of of transfer: 11/22/23  Discharge Plan and Services Additional resources added to the After Visit Summary for   In-house Referral: Clinical Social Work   Post Acute Care Choice: Skilled Nursing Facility          DME Arranged: Otho Blitz rolling DME Agency: [employer] Date DME Agency Contacted: 11/20/23 Time DME Agency Contacted: 1344 Representative spoke with at DME Agency: Joleen Navy HH Arranged: NA HH Agency: NA     Representative spoke with at [facility] Agency: Prearranged in orthopedist's office  Social Drivers of Health (SDOH) Interventions SDOH Screenings   Food Insecurity: No Food Insecurity (11/15/2023)  Housing: Low Risk  (11/15/2023)  Transportation Needs: No Transportation Needs (11/15/2023)  Utilities: Not At Risk (11/15/2023)  Alcohol Screen:  Low Risk  (10/26/2022)  Depression (PHQ2-9): Low Risk  (01/16/2023)  Financial Resource Strain: Low Risk  (10/26/2022)  Physical Activity: Insufficiently Active (10/26/2022)  Social Connections: Socially Integrated (11/15/2023)  Stress: No Stress Concern Present (10/26/2022)  Tobacco Use: Low Risk  (11/15/2023)     Readmission Risk Interventions    11/22/2023    1:51 PM  Readmission Risk Prevention Plan  Post Dischage Appt Complete  Medication Screening Complete  Transportation Screening Complete

## 2023-11-22 NOTE — Discharge Summary (Signed)
 Patient ID: Chad Norris MRN: [id number] DOB/AGE: 88-Dec-1933 88 y.o.  Admit date: 11/15/2023 Discharge date: 11/22/2023  Admission Diagnoses:  Principal Problem:   Status post total replacement of left hip Active Problems:   Unilateral primary osteoarthritis, left hip   Paroxysmal atrial fibrillation (HCC)   Postprocedural hypotension   Atrial fibrillation with RVR (HCC)   Left bundle branch block   Discharge Diagnoses:  Same  Past Medical History:  Diagnosis Date   Arthritis    Atrial fibrillation (HCC)    Colon polyps    COVID-19    Diverticulosis    Gout    Hemorrhoids    Hyperlipemia    Nephrolithiasis    Pneumonia    Prostate cancer (HCC)    Stroke [facility])     Surgeries: Procedure(s): ARTHROPLASTY, HIP, TOTAL, ANTERIOR APPROACH on 11/15/2023   Consultants:   Discharged Condition: Improved  Hospital Course: Chad Norris is an 89 y.o. male who was admitted 11/15/2023 for operative treatment ofStatus post total replacement of left hip. Patient has severe unremitting pain that affects sleep, daily activities, and work/hobbies. After pre-op clearance the patient was taken to the operating room on 11/15/2023 and underwent  Procedure(s): ARTHROPLASTY, HIP, TOTAL, ANTERIOR APPROACH.    Patient was given perioperative antibiotics:  Anti-infectives (From admission, onward)    Start     Dose/Rate Route Frequency Ordered Stop   11/19/23 1600  vancomycin  (VANCOREADY) IVPB 1250 mg/250 mL  Status:  Discontinued        1,250 mg 166.7 mL/hr over 90 Minutes Intravenous Every 24 hours 11/18/23 1430 11/19/23 1313   11/18/23 1530  vancomycin  (VANCOREADY) IVPB 1750 mg/350 mL        1,750 mg 175 mL/hr over 120 Minutes Intravenous  Once 11/18/23 1430 11/18/23 1748   11/15/23 1600  ceFAZolin  (ANCEF ) IVPB 2g/100 mL premix        2 g 200 mL/hr over 30 Minutes Intravenous Every 6 hours 11/15/23 1419 11/15/23 2235   11/15/23 0745  ceFAZolin  (ANCEF ) IVPB 2g/100 mL premix         2 g 200 mL/hr over 30 Minutes Intravenous On call to O.R. 11/15/23 0732 11/15/23 1016        Patient was given sequential compression devices, early ambulation, and chemoprophylaxis to prevent DVT.  The patient's postoperative course was complicated by A-fib with RVR.  This necessitated a transfer to the stepdown unit and a cardiology consult.  He was able to convert back to a regular rate with his A-fib.  This kept him in the hospital longer.  He is a frail and weaker individual and it was recommended to therapy that he need short-term skilled nursing post his hospitalization.  He also did require transfusion secondary to acute blood loss anemia.  By the day of discharge his vital signs are stable and his left operative hip was stable.  Recent vital signs: Patient Vitals for the past 24 hrs:  BP Temp Temp src Pulse Resp SpO2  11/22/23 0523 131/64 99.2 F (37.3 C) Oral 73 17 97 %  11/22/23 0133 122/71 98.6 F (37 C) Oral 72 17 95 %  11/21/23 2232 (!) 111/94 98.4 F (36.9 C) Oral 66 17 96 %  11/21/23 1641 136/63 98.1 F (36.7 C) -- 78 20 99 %  11/21/23 1426 139/73 -- -- 80 19 100 %  11/21/23 0912 128/66 99.5 F (37.5 C) Oral 75 -- 99 %     Recent laboratory studies:  Recent Labs  11/20/23 0344 11/20/23 1702  WBC 9.7  --   HGB 7.6* 8.2*  HCT 23.6* 25.3*  PLT 148*  --   NA 136  --   K 3.5  --   CL 108  --   CO2 20*  --   BUN 28*  --   CREATININE 0.83  --   GLUCOSE 117*  --   CALCIUM  7.3*  --      Discharge Medications:   Allergies as of 11/22/2023   No Known Allergies      Medication List     TAKE these medications    acetaminophen  325 MG tablet Commonly known as: Tylenol  Take 2 tablets (650 mg total) by mouth every 6 (six) hours as needed for moderate pain.   Adult One Daily Gummies Chew Chew 2 tablets by mouth in the morning. Vitafusion Adult Gummy Vitamins for Men   apixaban  5 MG Tabs tablet Commonly known as: ELIQUIS  Take 1 tablet (5 mg total) by  mouth 2 (two) times daily.   atorvastatin  40 MG tablet Commonly known as: LIPITOR TAKE 1 TABLET(40 MG) BY MOUTH DAILY What changed:  how much to take how to take this when to take this   diltiazem  30 MG tablet Commonly known as: CARDIZEM  Take 1 tablet (30 mg total) by mouth every 6 (six) hours as needed (for heart rate greater than 100 bpm.).   HYDROcodone -acetaminophen  5-325 MG tablet Commonly known as: NORCO/VICODIN Take 1-2 tablets by mouth every 6 (six) hours as needed for moderate pain (pain score 4-6).   methocarbamol  500 MG tablet Commonly known as: ROBAXIN  Take 1 tablet (500 mg total) by mouth every 6 (six) hours as needed for muscle spasms.   triamcinolone  cream 0.1 % Commonly known as: KENALOG  APPLY TOPICALLY TO THE AFFECTED AREA TWICE DAILY What changed: See the new instructions.   VITAMIN D3 PO Take 1 tablet by mouth in the morning.               Durable Medical Equipment  (From admission, onward)           Start     Ordered   11/15/23 1420  DME 3 n 1  Once        11/15/23 1419   11/15/23 1420  DME Walker rolling  Once       Question Answer Comment  Walker: With 5 Inch Wheels   Patient needs a walker to treat with the following condition Status post total replacement of left hip      11/15/23 1419            Diagnostic Studies: ECHOCARDIOGRAM COMPLETE Result Date: 11/18/2023    ECHOCARDIOGRAM REPORT   Patient Name:   Chad Norris Date of Exam: 11/18/2023 Medical Rec #:  [id number]           Height:       72.0 in Accession #:    [id number]          Weight:       188.0 lb Date of Birth:  [birth year]           BSA:          2.075 m Patient Age:    89 years            BP:           113/76 mmHg Patient Gender: M  HR:           82 bpm. Exam Location:  Inpatient Procedure: 2D Echo, Cardiac Doppler and Color Doppler (Both Spectral and Color            Flow Doppler were utilized during procedure). Indications:    Atrial  fibrillation  History:        Patient has prior history of Echocardiogram examinations, most                 recent 08/19/2018. Stroke; Risk Factors:Dyslipidemia.  Sonographer:    Juanita Shaw Referring Phys: [id number] XIKA ZHAO  Sonographer Comments: Technically difficult study due to poor echo windows and suboptimal subcostal window. IMPRESSIONS  1. Left ventricular ejection fraction, by estimation, is 60 to 65%. The left ventricle has normal function. The left ventricle has no regional wall motion abnormalities. Left ventricular diastolic parameters were normal.  2. Right ventricular systolic function is normal. The right ventricular size is normal.  3. The mitral valve is grossly normal. No evidence of mitral valve regurgitation. No evidence of mitral stenosis.  4. The aortic valve has an indeterminant number of cusps. Aortic valve regurgitation is not visualized. Aortic valve sclerosis is present, with no evidence of aortic valve stenosis.  5. Aortic Aortic root is within normal limits for age when indexed to body surface area. FINDINGS  Left Ventricle: Left ventricular ejection fraction, by estimation, is 60 to 65%. The left ventricle has normal function. The left ventricle has no regional wall motion abnormalities. The left ventricular internal cavity size was normal in size. There is  no left ventricular hypertrophy. Left ventricular diastolic parameters were normal. Right Ventricle: The right ventricular size is normal. No increase in right ventricular wall thickness. Right ventricular systolic function is normal. Left Atrium: Left atrial size was normal in size. Right Atrium: Right atrial size was normal in size. Pericardium: Trivial pericardial effusion is present. Mitral Valve: The mitral valve is grossly normal. No evidence of mitral valve regurgitation. No evidence of mitral valve stenosis. MV peak gradient, 7.6 mmHg. The mean mitral valve gradient is 4.0 mmHg. Tricuspid Valve: The tricuspid valve is not  well visualized. Tricuspid valve regurgitation is not demonstrated. No evidence of tricuspid stenosis. Aortic Valve: The aortic valve has an indeterminant number of cusps. Aortic valve regurgitation is not visualized. Aortic valve sclerosis is present, with no evidence of aortic valve stenosis. Aortic valve mean gradient measures 8.0 mmHg. Aortic valve peak gradient measures 12.8 mmHg. Aortic valve area, by VTI measures 2.13 cm. Pulmonic Valve: The pulmonic valve was not well visualized. Pulmonic valve regurgitation is not visualized. No evidence of pulmonic stenosis. Aorta: Aortic root is within normal limits for age when indexed to body surface area. Venous: The inferior vena cava was not well visualized. IAS/Shunts: The interatrial septum was not well visualized.  LEFT VENTRICLE PLAX 2D LVIDd:         4.70 cm      Diastology LVIDs:         3.30 cm      LV e' medial:    7.29 cm/s LV PW:         1.00 cm      LV E/e' medial:  10.4 LV IVS:        1.00 cm      LV e' lateral:   7.62 cm/s LVOT diam:     2.10 cm      LV E/e' lateral: 10.0 LV SV:  63 LV SV Index:   31 LVOT Area:     3.46 cm  LV Volumes (MOD) LV vol d, MOD A2C: 145.0 ml LV vol d, MOD A4C: 164.0 ml LV vol s, MOD A2C: 70.8 ml LV vol s, MOD A4C: 57.5 ml LV SV MOD A2C:     74.2 ml LV SV MOD A4C:     164.0 ml LV SV MOD BP:      91.9 ml RIGHT VENTRICLE RV Basal diam:  4.00 cm RV Mid diam:    2.40 cm RV S prime:     17.30 cm/s TAPSE (M-mode): 2.6 cm LEFT ATRIUM             Index        RIGHT ATRIUM          Index LA diam:        4.40 cm 2.12 cm/m   RA Area:     8.54 cm LA Vol (A2C):   61.5 ml 29.63 ml/m  RA Volume:   14.40 ml 6.94 ml/m LA Vol (A4C):   60.2 ml 29.01 ml/m LA Biplane Vol: 62.1 ml 29.92 ml/m  AORTIC VALVE                     PULMONIC VALVE AV Area (Vmax):    1.95 cm      PV Vmax:       1.11 m/s AV Area (Vmean):   1.73 cm      PV Peak grad:  4.9 mmHg AV Area (VTI):     2.13 cm AV Vmax:           179.00 cm/s AV Vmean:          129.000  cm/s AV VTI:            0.298 m AV Peak Grad:      12.8 mmHg AV Mean Grad:      8.0 mmHg LVOT Vmax:         101.00 cm/s LVOT Vmean:        64.500 cm/s LVOT VTI:          0.183 m LVOT/AV VTI ratio: 0.61  AORTA Ao Root diam: 4.10 cm Ao Asc diam:  3.30 cm MITRAL VALVE MV Area (PHT): 3.55 cm     SHUNTS MV Area VTI:   3.04 cm     Systemic VTI:  0.18 m MV Peak grad:  7.6 mmHg     Systemic Diam: 2.10 cm MV Mean grad:  4.0 mmHg MV Vmax:       1.38 m/s MV Vmean:      87.4 cm/s MV E velocity: 75.90 cm/s MV A velocity: 127.00 cm/s MV E/A ratio:  0.60 Sunit Tolia Electronically signed by Olinda Bertrand Signature Date/Time: 11/18/2023/6:05:37 PM    Final    DG CHEST PORT 1 VIEW Result Date: 11/18/2023 CLINICAL DATA:  Fever. EXAM: PORTABLE CHEST 1 VIEW COMPARISON:  02/07/2023. FINDINGS: The heart size and mediastinal contours are within normal limits. Asymmetric elevation of the right hemidiaphragm. No focal consolidation, pleural effusion, or pneumothorax. Partially visualized cervical fusion hardware. No acute osseous abnormality. IMPRESSION: Asymmetric elevation of the right hemidiaphragm. Otherwise, no acute cardiopulmonary findings. Electronically Signed   By: Mannie Seek M.D.   On: 11/18/2023 13:34   DG HIP UNILAT WITH PELVIS 1V LEFT Result Date: 11/15/2023 CLINICAL DATA:  Intraoperative fluoroscopy. Total left hip arthroplasty. EXAM: DG HIP (WITH OR WITHOUT PELVIS) 1V*L* COMPARISON:  Pelvis  and left hip radiographs 10/31/2022 FINDINGS: Images were performed intraoperatively without the presence of a radiologist. The patient is undergoing total left hip arthroplasty. No hardware complication is seen. Surgical clips again overlie the prostate bed. Total fluoroscopy images: 3 Total fluoroscopy time: 24 seconds Total dose: Radiation Exposure Index (as provided by the fluoroscopic device): 3.2 mGy air Kerma Please see intraoperative findings for further detail. IMPRESSION: Intraoperative fluoroscopy for total left hip  arthroplasty. Electronically Signed   By: Bertina Broccoli M.D.   On: 11/15/2023 14:34   DG Pelvis Portable Result Date: 11/15/2023 CLINICAL DATA:  Left hip arthroplasty. EXAM: PORTABLE PELVIS 1-2 VIEWS COMPARISON:  Radiograph dated 10/31/2022. FINDINGS: There is a total left hip arthroplasty. The arthroplasty components appear intact and in anatomic alignment. There is no acute fracture or dislocation. Severe arthritic changes of the right hip. Surgical clips over the pelvis. IMPRESSION: Total left hip arthroplasty. Electronically Signed   By: Angus Bark M.D.   On: 11/15/2023 12:53   DG C-Arm 1-60 Min-No Report Result Date: 11/15/2023 Fluoroscopy was utilized by the requesting physician.  No radiographic interpretation.    Disposition: Discharge disposition: 03-Skilled Nursing Facility          Follow-up Information     [name], MD Follow up in 2 week(s).   Specialty: Orthopedic Surgery Contact information: [address] [place]  [place] [address] [phone]         [facility]  Follow up.   Why: [facility] will provide PT in the home after discharge.                 Signed: [name] 11/22/2023, 7:09 AM

## 2023-11-23 LAB — CULTURE, BLOOD (ROUTINE X 2)
Culture: NO GROWTH
Culture: NO GROWTH

## 2023-11-25 DIAGNOSIS — R2689 Other abnormalities of gait and mobility: Secondary | ICD-10-CM | POA: Diagnosis not present

## 2023-11-25 DIAGNOSIS — I480 Paroxysmal atrial fibrillation: Secondary | ICD-10-CM | POA: Diagnosis not present

## 2023-11-25 DIAGNOSIS — Z471 Aftercare following joint replacement surgery: Secondary | ICD-10-CM | POA: Diagnosis not present

## 2023-11-25 DIAGNOSIS — R2681 Unsteadiness on feet: Secondary | ICD-10-CM | POA: Diagnosis not present

## 2023-11-25 DIAGNOSIS — M6281 Muscle weakness (generalized): Secondary | ICD-10-CM | POA: Diagnosis not present

## 2023-11-28 ENCOUNTER — Encounter: Payer: Self-pay | Admitting: Orthopaedic Surgery

## 2023-11-28 ENCOUNTER — Ambulatory Visit: Admitting: Orthopaedic Surgery

## 2023-11-28 DIAGNOSIS — Z471 Aftercare following joint replacement surgery: Secondary | ICD-10-CM | POA: Diagnosis not present

## 2023-11-28 DIAGNOSIS — M6281 Muscle weakness (generalized): Secondary | ICD-10-CM | POA: Diagnosis not present

## 2023-11-28 DIAGNOSIS — M109 Gout, unspecified: Secondary | ICD-10-CM | POA: Diagnosis not present

## 2023-11-28 DIAGNOSIS — M51379 Other intervertebral disc degeneration, lumbosacral region without mention of lumbar back pain or lower extremity pain: Secondary | ICD-10-CM | POA: Diagnosis not present

## 2023-11-28 DIAGNOSIS — I447 Left bundle-branch block, unspecified: Secondary | ICD-10-CM | POA: Diagnosis not present

## 2023-11-28 DIAGNOSIS — R2689 Other abnormalities of gait and mobility: Secondary | ICD-10-CM | POA: Diagnosis not present

## 2023-11-28 DIAGNOSIS — M1612 Unilateral primary osteoarthritis, left hip: Secondary | ICD-10-CM | POA: Diagnosis not present

## 2023-11-28 DIAGNOSIS — R2681 Unsteadiness on feet: Secondary | ICD-10-CM | POA: Diagnosis not present

## 2023-11-28 DIAGNOSIS — Z96642 Presence of left artificial hip joint: Secondary | ICD-10-CM

## 2023-11-28 DIAGNOSIS — I480 Paroxysmal atrial fibrillation: Secondary | ICD-10-CM | POA: Diagnosis not present

## 2023-11-28 NOTE — Progress Notes (Signed)
 The patient is an 89 year old male who is now almost 2 weeks out from a left total hip arthroplasty.  His postoperative course was complicated by A-fib with RVR.  He ended up in the ICU for at least a day.  He then had a tough recovery after that in terms of just being anemic and needing transfusions.  He is now staying in a nursing care facility and is scheduled to be released on Sunday.  His wife is with him today.  They report that he is doing well.  His left hip incision looks good and the staples been removed and Steri-Strips applied.  He tolerates standing and is moving his left hip around.  He needs to really go slow.  I do feel it would be appropriate for him to have PT and OT at home.  From our standpoint we will see him back in 6 weeks to see how he is doing overall but no x-rays are needed.

## 2023-11-30 ENCOUNTER — Encounter: Payer: Self-pay | Admitting: Internal Medicine

## 2023-12-02 ENCOUNTER — Telehealth: Payer: Self-pay | Admitting: Orthopaedic Surgery

## 2023-12-02 ENCOUNTER — Telehealth: Payer: Self-pay

## 2023-12-02 DIAGNOSIS — I9581 Postprocedural hypotension: Secondary | ICD-10-CM | POA: Diagnosis not present

## 2023-12-02 DIAGNOSIS — D696 Thrombocytopenia, unspecified: Secondary | ICD-10-CM | POA: Diagnosis not present

## 2023-12-02 DIAGNOSIS — I447 Left bundle-branch block, unspecified: Secondary | ICD-10-CM | POA: Diagnosis not present

## 2023-12-02 DIAGNOSIS — I480 Paroxysmal atrial fibrillation: Secondary | ICD-10-CM | POA: Diagnosis not present

## 2023-12-02 DIAGNOSIS — Z96642 Presence of left artificial hip joint: Secondary | ICD-10-CM | POA: Diagnosis not present

## 2023-12-02 DIAGNOSIS — Z471 Aftercare following joint replacement surgery: Secondary | ICD-10-CM | POA: Diagnosis not present

## 2023-12-02 NOTE — Telephone Encounter (Signed)
 Chad Norris called from [facility] and needs verbal orders  for PT. 1 wk 1, 2wk 2 , 1 wk 3. CB#[phone]

## 2023-12-02 NOTE — Telephone Encounter (Signed)
 Verbal order left on VM

## 2023-12-02 NOTE — Transitions of Care (Post Inpatient/ED Visit) (Unsigned)
   12/02/2023  Name: Chad Norris MRN: [id number] DOB: July 28, 1931  Today's TOC FU Call Status: Today's TOC FU Call Status:: Unsuccessful Call (1st Attempt) Unsuccessful Call (1st Attempt) Date: 12/02/23  Attempted to reach the patient regarding the most recent Inpatient/ED visit.  Follow Up Plan: Additional outreach attempts will be made to reach the patient to complete the Transitions of Care (Post Inpatient/ED visit) call.   Signature Darrall Ellison, LPN [facility] Nurse Health Advisor Direct Dial [phone]

## 2023-12-03 DIAGNOSIS — Z471 Aftercare following joint replacement surgery: Secondary | ICD-10-CM | POA: Diagnosis not present

## 2023-12-03 DIAGNOSIS — D696 Thrombocytopenia, unspecified: Secondary | ICD-10-CM | POA: Diagnosis not present

## 2023-12-03 DIAGNOSIS — I9581 Postprocedural hypotension: Secondary | ICD-10-CM | POA: Diagnosis not present

## 2023-12-03 DIAGNOSIS — Z96642 Presence of left artificial hip joint: Secondary | ICD-10-CM | POA: Diagnosis not present

## 2023-12-03 DIAGNOSIS — I447 Left bundle-branch block, unspecified: Secondary | ICD-10-CM | POA: Diagnosis not present

## 2023-12-03 DIAGNOSIS — I480 Paroxysmal atrial fibrillation: Secondary | ICD-10-CM | POA: Diagnosis not present

## 2023-12-03 NOTE — Transitions of Care (Post Inpatient/ED Visit) (Signed)
 12/03/2023  Name: Chad Norris MRN: [id number] DOB: [birth year]  Today's TOC FU Call Status: Today's TOC FU Call Status:: Successful TOC FU Call Completed Unsuccessful Call (1st Attempt) Date: 12/02/23 [facility] FU Call Complete Date: 12/03/23 Patient's Name and Date of Birth confirmed.  Transition Care Management Follow-up Telephone Call Date of Discharge: 12/01/23 Discharge Facility: Other [facility]) Name of Other ([facility]) Discharge Facility: [facility] Type of Discharge: Inpatient Admission Primary Inpatient Discharge Diagnosis:: muscle weakness How have you been since you were released from the hospital?: Better Any questions or concerns?: No  Items Reviewed: Did you receive and understand the discharge instructions provided?: Yes Medications obtained,verified, and reconciled?: Yes (Medications Reviewed) Any new allergies since your discharge?: No Dietary orders reviewed?: Yes Do you have support at home?: Yes People in Home [RPT]: spouse  Medications Reviewed Today: Medications Reviewed Today     Reviewed by Darrall Ellison, LPN (Licensed Practical Nurse) on 12/03/23 at 1220  Med List Status: <None>   Medication Order Taking? Sig Documenting Provider Last Dose Status Informant  acetaminophen  (TYLENOL ) 325 MG tablet [id number] No Take 2 tablets (650 mg total) by mouth every 6 (six) hours as needed for moderate pain. Adolph Hoop, PA-C Past Week Active Spouse/Significant Other  apixaban  (ELIQUIS ) 5 MG TABS tablet [id number] No Take 1 tablet (5 mg total) by mouth 2 (two) times daily. Bridgette Campus, MD 11/11/2023 Active Spouse/Significant Other  atorvastatin  (LIPITOR) 40 MG tablet [id number] No TAKE 1 TABLET(40 MG) BY MOUTH DAILY  Patient taking differently: Take 40 mg by mouth at bedtime. TAKE 1 TABLET(40 MG) BY MOUTH DAILY   Burns, Beckey Bourgeois, MD Past Week Active Spouse/Significant Other  Cholecalciferol (VITAMIN D3 PO) [id number] No Take 1 tablet by mouth in the  morning. [provider] Past Week Active Spouse/Significant Other  diltiazem  (CARDIZEM ) 30 MG tablet [id number] No Take 1 tablet (30 mg total) by mouth every 6 (six) hours as needed (for heart rate greater than 100 bpm.). Bridgette Campus, MD Past Week Active Spouse/Significant Other  HYDROcodone -acetaminophen  (NORCO/VICODIN) 5-325 MG tablet [id number]  Take 1-2 tablets by mouth every 6 (six) hours as needed for moderate pain (pain score 4-6). [name], MD  Active   methocarbamol  (ROBAXIN ) 500 MG tablet [id number]  Take 1 tablet (500 mg total) by mouth every 6 (six) hours as needed for muscle spasms. [name], MD  Active   Multiple Vitamins-Minerals (ADULT ONE DAILY GUMMIES) CHEW [id number] No Chew 2 tablets by mouth in the morning. Vitafusion Adult Gummy Vitamins for Men [provider] Past Week Active Spouse/Significant Other  triamcinolone  cream (KENALOG ) 0.1 % [id number] No APPLY TOPICALLY TO THE AFFECTED AREA TWICE DAILY  Patient taking differently: Apply 1 Application topically 2 (two) times daily as needed (skin irritation/rash).   Colene Dauphin, MD Past Week Active Spouse/Significant Other            Home Care and Equipment/Supplies: Were Home Health Services Ordered?: Yes Name of Home Health Agency:: unknown Has Agency set up a time to come to your home?: Yes First Home Health Visit Date: 12/03/23 Any new equipment or medical supplies ordered?: NA  Functional Questionnaire: Do you need assistance with bathing/showering or dressing?: No Do you need assistance with meal preparation?: No Do you need assistance with eating?: No Do you have difficulty maintaining continence: No Do you need assistance with getting out of bed/getting out of a chair/moving?: No Do you have difficulty managing or taking your medications?: No  Follow up appointments reviewed: PCP Follow-up appointment confirmed?: No (Dr Donnette Gal told wife to schedule in 6  weeks) MD Provider Line Number:[phone] Given: No Specialist Hospital Follow-up appointment confirmed?: NA Do you need transportation to your follow-up appointment?: No Do you understand care options if your condition(s) worsen?: Yes-patient verbalized understanding    SIGNATURE Darrall Ellison, LPN [facility] Nurse Health Advisor Direct Dial [phone]

## 2023-12-04 ENCOUNTER — Inpatient Hospital Stay: Admitting: Internal Medicine

## 2023-12-04 ENCOUNTER — Telehealth: Payer: Self-pay | Admitting: Orthopaedic Surgery

## 2023-12-04 ENCOUNTER — Encounter: Payer: Self-pay | Admitting: Internal Medicine

## 2023-12-04 NOTE — Telephone Encounter (Signed)
 Haskell Linker with [facility] called. She would like OT 1x wk for 6 wks. Safety with self care and transfers verbal orders. Her cb# [phone]

## 2023-12-04 NOTE — Telephone Encounter (Signed)
 Verbal order left on VM

## 2023-12-05 DIAGNOSIS — Z96642 Presence of left artificial hip joint: Secondary | ICD-10-CM | POA: Diagnosis not present

## 2023-12-05 DIAGNOSIS — I9581 Postprocedural hypotension: Secondary | ICD-10-CM | POA: Diagnosis not present

## 2023-12-05 DIAGNOSIS — I480 Paroxysmal atrial fibrillation: Secondary | ICD-10-CM | POA: Diagnosis not present

## 2023-12-05 DIAGNOSIS — Z471 Aftercare following joint replacement surgery: Secondary | ICD-10-CM | POA: Diagnosis not present

## 2023-12-05 DIAGNOSIS — I447 Left bundle-branch block, unspecified: Secondary | ICD-10-CM | POA: Diagnosis not present

## 2023-12-05 DIAGNOSIS — D696 Thrombocytopenia, unspecified: Secondary | ICD-10-CM | POA: Diagnosis not present

## 2023-12-12 DIAGNOSIS — Z96642 Presence of left artificial hip joint: Secondary | ICD-10-CM | POA: Diagnosis not present

## 2023-12-12 DIAGNOSIS — I9581 Postprocedural hypotension: Secondary | ICD-10-CM | POA: Diagnosis not present

## 2023-12-12 DIAGNOSIS — I447 Left bundle-branch block, unspecified: Secondary | ICD-10-CM | POA: Diagnosis not present

## 2023-12-12 DIAGNOSIS — I480 Paroxysmal atrial fibrillation: Secondary | ICD-10-CM | POA: Diagnosis not present

## 2023-12-12 DIAGNOSIS — Z471 Aftercare following joint replacement surgery: Secondary | ICD-10-CM | POA: Diagnosis not present

## 2023-12-12 DIAGNOSIS — D696 Thrombocytopenia, unspecified: Secondary | ICD-10-CM | POA: Diagnosis not present

## 2023-12-13 DIAGNOSIS — Z96642 Presence of left artificial hip joint: Secondary | ICD-10-CM | POA: Diagnosis not present

## 2023-12-13 DIAGNOSIS — I447 Left bundle-branch block, unspecified: Secondary | ICD-10-CM | POA: Diagnosis not present

## 2023-12-13 DIAGNOSIS — Z471 Aftercare following joint replacement surgery: Secondary | ICD-10-CM | POA: Diagnosis not present

## 2023-12-13 DIAGNOSIS — I9581 Postprocedural hypotension: Secondary | ICD-10-CM | POA: Diagnosis not present

## 2023-12-13 DIAGNOSIS — D696 Thrombocytopenia, unspecified: Secondary | ICD-10-CM | POA: Diagnosis not present

## 2023-12-13 DIAGNOSIS — I480 Paroxysmal atrial fibrillation: Secondary | ICD-10-CM | POA: Diagnosis not present

## 2023-12-17 ENCOUNTER — Ambulatory Visit: Payer: Self-pay | Admitting: Medical Oncology

## 2023-12-17 ENCOUNTER — Inpatient Hospital Stay: Payer: Medicare Other | Attending: Family

## 2023-12-17 ENCOUNTER — Inpatient Hospital Stay (HOSPITAL_BASED_OUTPATIENT_CLINIC_OR_DEPARTMENT_OTHER): Payer: Medicare Other | Admitting: Medical Oncology

## 2023-12-17 ENCOUNTER — Encounter: Payer: Self-pay | Admitting: Medical Oncology

## 2023-12-17 VITALS — BP 124/66 | HR 57 | Temp 98.0°F | Resp 18 | Ht 72.0 in | Wt 188.1 lb

## 2023-12-17 DIAGNOSIS — I480 Paroxysmal atrial fibrillation: Secondary | ICD-10-CM | POA: Diagnosis not present

## 2023-12-17 DIAGNOSIS — D696 Thrombocytopenia, unspecified: Secondary | ICD-10-CM

## 2023-12-17 DIAGNOSIS — Z7901 Long term (current) use of anticoagulants: Secondary | ICD-10-CM | POA: Diagnosis not present

## 2023-12-17 DIAGNOSIS — D693 Immune thrombocytopenic purpura: Secondary | ICD-10-CM | POA: Insufficient documentation

## 2023-12-17 DIAGNOSIS — I9581 Postprocedural hypotension: Secondary | ICD-10-CM | POA: Diagnosis not present

## 2023-12-17 DIAGNOSIS — Z96642 Presence of left artificial hip joint: Secondary | ICD-10-CM | POA: Diagnosis not present

## 2023-12-17 DIAGNOSIS — I4891 Unspecified atrial fibrillation: Secondary | ICD-10-CM | POA: Diagnosis not present

## 2023-12-17 DIAGNOSIS — I447 Left bundle-branch block, unspecified: Secondary | ICD-10-CM | POA: Diagnosis not present

## 2023-12-17 DIAGNOSIS — Z471 Aftercare following joint replacement surgery: Secondary | ICD-10-CM | POA: Diagnosis not present

## 2023-12-17 LAB — CMP (CANCER CENTER ONLY)
ALT: 9 U/L (ref 0–44)
AST: 16 U/L (ref 15–41)
Albumin: 4.6 g/dL (ref 3.5–5.0)
Alkaline Phosphatase: 55 U/L (ref 38–126)
Anion gap: 7 (ref 5–15)
BUN: 16 mg/dL (ref 8–23)
CO2: 27 mmol/L (ref 22–32)
Calcium: 9.2 mg/dL (ref 8.9–10.3)
Chloride: 106 mmol/L (ref 98–111)
Creatinine: 1.12 mg/dL (ref 0.61–1.24)
GFR, Estimated: >60 mL/min (ref >60)
Glucose, Bld: 93 mg/dL (ref 70–99)
Potassium: 4.7 mmol/L (ref 3.5–5.1)
Sodium: 140 mmol/L (ref 135–145)
Total Bilirubin: 1 mg/dL (ref 0.0–1.2)
Total Protein: 6.6 g/dL (ref 6.5–8.1)

## 2023-12-17 LAB — CBC WITH DIFFERENTIAL (CANCER CENTER ONLY)
Abs Immature Granulocytes: 0.02 10*3/uL (ref 0.00–0.07)
Basophils Absolute: 0 10*3/uL (ref 0.0–0.1)
Basophils Relative: 1 %
Eosinophils Absolute: 0.2 10*3/uL (ref 0.0–0.5)
Eosinophils Relative: 3 %
HCT: 34.5 % — ABNORMAL LOW (ref 39.0–52.0)
Hemoglobin: 11.2 g/dL — ABNORMAL LOW (ref 13.0–17.0)
Immature Granulocytes: 0 %
Lymphocytes Relative: 37 %
Lymphs Abs: 1.7 10*3/uL (ref 0.7–4.0)
MCH: 31.1 pg (ref 26.0–34.0)
MCHC: 32.5 g/dL (ref 30.0–36.0)
MCV: 95.8 fL (ref 80.0–100.0)
Monocytes Absolute: 0.8 10*3/uL (ref 0.1–1.0)
Monocytes Relative: 17 %
Neutro Abs: 1.9 10*3/uL (ref 1.7–7.7)
Neutrophils Relative %: 42 %
Platelet Count: 80 10*3/uL — ABNORMAL LOW (ref 150–400)
RBC: 3.6 MIL/uL — ABNORMAL LOW (ref 4.22–5.81)
RDW: 15.9 % — ABNORMAL HIGH (ref 11.5–15.5)
WBC Count: 4.6 10*3/uL (ref 4.0–10.5)
nRBC: 0 % (ref 0.0–0.2)

## 2023-12-17 LAB — LACTATE DEHYDROGENASE: LDH: 263 U/L — ABNORMAL HIGH (ref 98–192)

## 2023-12-17 LAB — SAVE SMEAR(SSMR), FOR PROVIDER SLIDE REVIEW

## 2023-12-17 NOTE — Progress Notes (Signed)
 Hematology and Oncology Follow Up Visit  Chad Norris [id number] 08-09-1931 89 y.o. 12/17/2023   Principle Diagnosis:  Chronic ITP  Current Therapy:   Observation   Interim History:  Chad Norris is here today for follow up of his thrombocytopenia:  He is doing really well in general. He had his right hip replaced in April. Unfortunatly during recovery he had an episode of Afib and wound up in the ICU. He is doing much better now.  No recent falls He does have history of stroke with left leg weakness.  He has A. Fib for which he takes Eliquis  and Cardizem  He has occasional blurry vision lasting only a few minutes.  No new falls, no syncope.  No swelling, tenderness, numbness or tingling in his extremities. No fever, chills, n/v, cough, rash, SOB, chest pain, palpitations, abdominal pain or changes in bowel or bladder habits.   Appetite is good Wt Readings from Last 3 Encounters:  12/17/23 188 lb 1.9 oz (85.3 kg)  11/15/23 188 lb (85.3 kg)  09/10/23 188 lb (85.3 kg)   ECOG Performance Status: 1 - Symptomatic but completely ambulatory  Medications:  Allergies as of 12/17/2023   No Known Allergies      Medication List        Accurate as of Dec 17, 2023 10:52 AM. If you have any questions, ask your nurse or doctor.          acetaminophen  325 MG tablet Commonly known as: Tylenol  Take 2 tablets (650 mg total) by mouth every 6 (six) hours as needed for moderate pain.   Adult One Daily Gummies Chew Chew 2 tablets by mouth in the morning. Vitafusion Adult Gummy Vitamins for Men   apixaban  5 MG Tabs tablet Commonly known as: ELIQUIS  Take 1 tablet (5 mg total) by mouth 2 (two) times daily.   atorvastatin  40 MG tablet Commonly known as: LIPITOR TAKE 1 TABLET(40 MG) BY MOUTH DAILY What changed:  how much to take how to take this when to take this   diltiazem  30 MG tablet Commonly known as: CARDIZEM  Take 1 tablet (30 mg total) by mouth every 6 (six) hours as  needed (for heart rate greater than 100 bpm.).   HYDROcodone -acetaminophen  5-325 MG tablet Commonly known as: NORCO/VICODIN Take 1-2 tablets by mouth every 6 (six) hours as needed for moderate pain (pain score 4-6).   methocarbamol  500 MG tablet Commonly known as: ROBAXIN  Take 1 tablet (500 mg total) by mouth every 6 (six) hours as needed for muscle spasms.   triamcinolone  cream 0.1 % Commonly known as: KENALOG  APPLY TOPICALLY TO THE AFFECTED AREA TWICE DAILY What changed: See the new instructions.   VITAMIN D3 PO Take 1 tablet by mouth in the morning.        Allergies: No Known Allergies  Past Medical History, Surgical history, Social history, and Family History were reviewed and updated.  Review of Systems: All other 10 point review of systems is negative.   Physical Exam:  height is 6' (1.829 m) and weight is 188 lb 1.9 oz (85.3 kg). His oral temperature is 98 F (36.7 C). His blood pressure is 124/66 and his pulse is 57 (abnormal). His respiration is 18 and oxygen saturation is 100%.   Wt Readings from Last 3 Encounters:  12/17/23 188 lb 1.9 oz (85.3 kg)  11/15/23 188 lb (85.3 kg)  09/10/23 188 lb (85.3 kg)    Ocular: Sclerae unicteric, pupils equal, round and reactive to light Ear-nose-throat: Oropharynx  clear, dentition fair Lymphatic: No cervical or supraclavicular adenopathy Lungs no rales or rhonchi, good excursion bilaterally Heart regular rate and rhythm, no murmur appreciated Abd soft, nontender, positive bowel sounds MSK no focal spinal tenderness, no joint edema Neuro: non-focal, well-oriented, appropriate affect   Lab Results  Component Value Date   WBC 4.6 12/17/2023   HGB 11.2 (L) 12/17/2023   HCT 34.5 (L) 12/17/2023   MCV 95.8 12/17/2023   PLT 80 (L) 12/17/2023   Lab Results  Component Value Date   FERRITIN 307 07/06/2021   IRON 93 07/06/2021   TIBC 262 07/06/2021   IRONPCTSAT 35 07/06/2021   Lab Results  Component Value Date   RBC  3.60 (L) 12/17/2023   No results found for: "KPAFRELGTCHN", "LAMBDASER", "KAPLAMBRATIO" No results found for: "IGGSERUM", "IGA", "IGMSERUM" No results found for: "TOTALPROTELP", "ALBUMINELP", "A1GS", "A2GS", "BETS", "BETA2SER", "GAMS", "MSPIKE", "SPEI"   Chemistry      Component Value Date/Time   NA 140 12/17/2023 0955   NA 139 03/01/2021 1031   K 4.7 12/17/2023 0955   CL 106 12/17/2023 0955   CO2 27 12/17/2023 0955   BUN 16 12/17/2023 0955   BUN 20 03/01/2021 1031   CREATININE 1.12 12/17/2023 0955      Component Value Date/Time   CALCIUM  9.2 12/17/2023 0955   ALKPHOS 55 12/17/2023 0955   AST 16 12/17/2023 0955   ALT 9 12/17/2023 0955   BILITOT 1.0 12/17/2023 0955     Encounter Diagnosis  Name Primary?   Thrombocytopenia (HCC) Yes    Impression and Plan:  Chad Norris is a very pleasant 89 yo caucasian gentleman with thrombocytopenia first noted in September 2022- suggestive chronic ITP on previous smear. He is on Eliquis  for A. Fib for his history of stroke.   Hgb looks great.  Platelets are 80 today - baseline is around 100- no bleeding We will reduce his follow up from 6 months to 3 months to ensure stability Continue Eliquis  at this time however we may reduce dose or hold if platelets reduce below 50 or he have any bleeding events  RTC 3 months APP/MD, labs (CBC w/, CMP, save smear, LDH)-[place]   Sharla Davis, PA-C [id number] AM

## 2023-12-18 DIAGNOSIS — Z96642 Presence of left artificial hip joint: Secondary | ICD-10-CM | POA: Diagnosis not present

## 2023-12-18 DIAGNOSIS — Z471 Aftercare following joint replacement surgery: Secondary | ICD-10-CM | POA: Diagnosis not present

## 2023-12-18 DIAGNOSIS — D696 Thrombocytopenia, unspecified: Secondary | ICD-10-CM | POA: Diagnosis not present

## 2023-12-18 DIAGNOSIS — I480 Paroxysmal atrial fibrillation: Secondary | ICD-10-CM | POA: Diagnosis not present

## 2023-12-18 DIAGNOSIS — I9581 Postprocedural hypotension: Secondary | ICD-10-CM | POA: Diagnosis not present

## 2023-12-18 DIAGNOSIS — I447 Left bundle-branch block, unspecified: Secondary | ICD-10-CM | POA: Diagnosis not present

## 2023-12-19 DIAGNOSIS — I447 Left bundle-branch block, unspecified: Secondary | ICD-10-CM | POA: Diagnosis not present

## 2023-12-19 DIAGNOSIS — D696 Thrombocytopenia, unspecified: Secondary | ICD-10-CM | POA: Diagnosis not present

## 2023-12-19 DIAGNOSIS — Z471 Aftercare following joint replacement surgery: Secondary | ICD-10-CM | POA: Diagnosis not present

## 2023-12-19 DIAGNOSIS — I9581 Postprocedural hypotension: Secondary | ICD-10-CM | POA: Diagnosis not present

## 2023-12-19 DIAGNOSIS — Z96642 Presence of left artificial hip joint: Secondary | ICD-10-CM | POA: Diagnosis not present

## 2023-12-19 DIAGNOSIS — I480 Paroxysmal atrial fibrillation: Secondary | ICD-10-CM | POA: Diagnosis not present

## 2023-12-23 DIAGNOSIS — I447 Left bundle-branch block, unspecified: Secondary | ICD-10-CM | POA: Diagnosis not present

## 2023-12-23 DIAGNOSIS — I9581 Postprocedural hypotension: Secondary | ICD-10-CM | POA: Diagnosis not present

## 2023-12-23 DIAGNOSIS — D696 Thrombocytopenia, unspecified: Secondary | ICD-10-CM | POA: Diagnosis not present

## 2023-12-23 DIAGNOSIS — Z96642 Presence of left artificial hip joint: Secondary | ICD-10-CM | POA: Diagnosis not present

## 2023-12-23 DIAGNOSIS — I480 Paroxysmal atrial fibrillation: Secondary | ICD-10-CM | POA: Diagnosis not present

## 2023-12-23 DIAGNOSIS — Z471 Aftercare following joint replacement surgery: Secondary | ICD-10-CM | POA: Diagnosis not present

## 2023-12-30 DIAGNOSIS — I9581 Postprocedural hypotension: Secondary | ICD-10-CM | POA: Diagnosis not present

## 2023-12-30 DIAGNOSIS — Z96642 Presence of left artificial hip joint: Secondary | ICD-10-CM | POA: Diagnosis not present

## 2023-12-30 DIAGNOSIS — Z471 Aftercare following joint replacement surgery: Secondary | ICD-10-CM | POA: Diagnosis not present

## 2023-12-30 DIAGNOSIS — I480 Paroxysmal atrial fibrillation: Secondary | ICD-10-CM | POA: Diagnosis not present

## 2023-12-30 DIAGNOSIS — D696 Thrombocytopenia, unspecified: Secondary | ICD-10-CM | POA: Diagnosis not present

## 2023-12-30 DIAGNOSIS — I447 Left bundle-branch block, unspecified: Secondary | ICD-10-CM | POA: Diagnosis not present

## 2024-01-01 DIAGNOSIS — I9581 Postprocedural hypotension: Secondary | ICD-10-CM | POA: Diagnosis not present

## 2024-01-01 DIAGNOSIS — I447 Left bundle-branch block, unspecified: Secondary | ICD-10-CM | POA: Diagnosis not present

## 2024-01-01 DIAGNOSIS — I480 Paroxysmal atrial fibrillation: Secondary | ICD-10-CM | POA: Diagnosis not present

## 2024-01-01 DIAGNOSIS — Z96642 Presence of left artificial hip joint: Secondary | ICD-10-CM | POA: Diagnosis not present

## 2024-01-01 DIAGNOSIS — D696 Thrombocytopenia, unspecified: Secondary | ICD-10-CM | POA: Diagnosis not present

## 2024-01-01 DIAGNOSIS — Z471 Aftercare following joint replacement surgery: Secondary | ICD-10-CM | POA: Diagnosis not present

## 2024-01-09 ENCOUNTER — Encounter: Payer: Self-pay | Admitting: Orthopaedic Surgery

## 2024-01-09 ENCOUNTER — Ambulatory Visit (INDEPENDENT_AMBULATORY_CARE_PROVIDER_SITE_OTHER): Admitting: Orthopaedic Surgery

## 2024-01-09 DIAGNOSIS — Z96642 Presence of left artificial hip joint: Secondary | ICD-10-CM

## 2024-01-09 DIAGNOSIS — Z471 Aftercare following joint replacement surgery: Secondary | ICD-10-CM | POA: Diagnosis not present

## 2024-01-09 DIAGNOSIS — I9581 Postprocedural hypotension: Secondary | ICD-10-CM | POA: Diagnosis not present

## 2024-01-09 DIAGNOSIS — D696 Thrombocytopenia, unspecified: Secondary | ICD-10-CM | POA: Diagnosis not present

## 2024-01-09 DIAGNOSIS — I480 Paroxysmal atrial fibrillation: Secondary | ICD-10-CM | POA: Diagnosis not present

## 2024-01-09 DIAGNOSIS — I447 Left bundle-branch block, unspecified: Secondary | ICD-10-CM | POA: Diagnosis not present

## 2024-01-09 NOTE — Progress Notes (Signed)
 The patient is here today in follow-up getting close to 8 weeks status post a left total hip replacement to treat significant left hip pain and arthritis.  He is doing a lot of activities in his extremities causing discomfort but overall he is doing well.  He is 89 years old and walking without assistive device.  He has got excellent range of motion of his left hip with no stiffness and no pain.  His right hip also moves smoothly.  He walks without a limp.  He will continue increase his activities as comfort allows.  The next time we need to see him is not for 6 months unless he is having issues.  Will have a standing low AP pelvis and a lateral of his left operative hip at that visit.  If there are issues before then he knows to reach out and let us  know.

## 2024-01-15 NOTE — Progress Notes (Unsigned)
 Cardiology Office Note:    Date:  01/16/2024   ID:  Chad Norris, DOB 06-Feb-1932, MRN [id number]  PCP:  Geofm Glade PARAS, MD  Cardiologist:  None  Electrophysiologist:  None   Referring MD: Geofm Glade PARAS, MD   Chief Complaint  Patient presents with   Atrial Fibrillation    History of Present Illness:    Chad Norris is a 89 y.o. male with a hx of paroxysmal atrial fibrillation, CVA, ITP, hyperlipidemia who presents for follow-up.  Previously followed with Dr. Harvie.  Echocardiogram 10/2023 showed EF 60 to 65%, normal RV function, no significant valvular disease.  Since last clinic visit, he reports he is doing well.  Denies any chest pain, dyspnea, lightheadedness, syncope, lower extremity edema, or palpitations.  He had some A-fib with RVR after his hip surgery in April, otherwise has not noted any A-fib.  He has not had to take any diltiazem .  He walks 1/8 of a mile every morning and also lifts weights.  He is taking Eliquis , denies any bleeding issues but is concerned about the price of Eliquis .  Past Medical History:  Diagnosis Date   Arthritis    Atrial fibrillation (HCC)    Colon polyps    COVID-19    Diverticulosis    Gout    Hemorrhoids    Hyperlipemia    Nephrolithiasis    Pneumonia    Prostate cancer (HCC)    Stroke [facility])     Past Surgical History:  Procedure Laterality Date   CATARACT EXTRACTION W/ INTRAOCULAR LENS  IMPLANT, BILATERAL     bi-lat   CERVICAL SPINE SURGERY     COLONOSCOPY W/ POLYPECTOMY     Epidural steroid injections     3-4, Dr Colon   RETROPUBIC PROSTATECTOMY     SHOULDER SURGERY     left   TOTAL HIP ARTHROPLASTY Left 11/15/2023   Procedure: ARTHROPLASTY, HIP, TOTAL, ANTERIOR APPROACH;  Surgeon: Vernetta Lonni GRADE, MD;  Location: [facility];  Service: Orthopedics;  Laterality: Left;    Current Medications: Current Meds  Medication Sig   acetaminophen  (TYLENOL ) 325 MG tablet Take 2 tablets (650 mg total) by mouth every  6 (six) hours as needed for moderate pain.   atorvastatin  (LIPITOR) 40 MG tablet TAKE 1 TABLET(40 MG) BY MOUTH DAILY   Cholecalciferol (VITAMIN D3 PO) Take 1 tablet by mouth in the morning.   dabigatran (PRADAXA) 150 MG CAPS capsule Take 1 capsule (150 mg total) by mouth 2 (two) times daily.   diltiazem  (CARDIZEM ) 30 MG tablet Take 1 tablet (30 mg total) by mouth every 6 (six) hours as needed (for heart rate greater than 100 bpm.).   methocarbamol  (ROBAXIN ) 500 MG tablet Take 1 tablet (500 mg total) by mouth every 6 (six) hours as needed for muscle spasms.   Multiple Vitamins-Minerals (ADULT ONE DAILY GUMMIES) CHEW Chew 2 tablets by mouth in the morning. Vitafusion Adult Gummy Vitamins for Men   triamcinolone  cream (KENALOG ) 0.1 % APPLY TOPICALLY TO THE AFFECTED AREA TWICE DAILY   [DISCONTINUED] apixaban  (ELIQUIS ) 5 MG TABS tablet Take 1 tablet (5 mg total) by mouth 2 (two) times daily.     Allergies:   Patient has no known allergies.   Social History   Socioeconomic History   Marital status: Married    Spouse name: Not on file   Number of children: 2   Years of education: Not on file   Highest education level: 12th grade  Occupational History  Occupation: retired  Tobacco Use   Smoking status: Never   Smokeless tobacco: Never  Vaping Use   Vaping status: Never Used  Substance and Sexual Activity   Alcohol use: Not Currently    Alcohol/week: 1.0 standard drink of alcohol    Types: 1 Cans of beer per week   Drug use: No   Sexual activity: Not Currently  Other Topics Concern   Not on file  Social History Narrative   Not on file   Social Drivers of Health   Financial Resource Strain: Low Risk  (10/26/2022)   Overall Financial Resource Strain (CARDIA)    Difficulty of Paying Living Expenses: Not very hard  Food Insecurity: No Food Insecurity (11/15/2023)   Hunger Vital Sign    Worried About Running Out of Food in the Last Year: Never true    Ran Out of Food in the Last Year:  Never true  Transportation Needs: No Transportation Needs (11/15/2023)   PRAPARE - Administrator, Civil Service (Medical): No    Lack of Transportation (Non-Medical): No  Physical Activity: Insufficiently Active (10/26/2022)   Exercise Vital Sign    Days of Exercise per Week: 1 day    Minutes of Exercise per Session: 10 min  Stress: No Stress Concern Present (10/26/2022)   [employer] of Occupational Health - Occupational Stress Questionnaire    Feeling of Stress : Only a little  Social Connections: Socially Integrated (11/15/2023)   Social Connection and Isolation Panel    Frequency of Communication with Friends and Family: More than three times a week    Frequency of Social Gatherings with Friends and Family: More than three times a week    Attends Religious Services: 1 to 4 times per year    Active Member of [employer] or Organizations: Yes    Attends Engineer, structural: 1 to 4 times per year    Marital Status: Married     Family History: The patient's family history includes Alcohol abuse in his brother; Breast cancer in his sister; Cancer in his brother; Colon cancer in his father; Colon polyps in his brother; Diabetes in his brother; Heart attack (age of onset: 64) in his brother; Heart attack (age of onset: 53) in his sister; Kidney cancer in his father.  ROS:   Please see the history of present illness.     All other systems reviewed and are negative.  EKGs/Labs/Other Studies Reviewed:    The following studies were reviewed today:   EKG:  EKG is not ordered today.    Recent Labs: 11/18/2023: Magnesium 2.2; TSH 1.730 12/17/2023: ALT 9; BUN 16; Creatinine 1.12; Hemoglobin 11.2; Platelet Count 80; Potassium 4.7; Sodium 140  Recent Lipid Panel    Component Value Date/Time   CHOL 195 06/06/2022 1025   CHOL 127 04/09/2019 1540   CHOL 216 (H) 01/13/2014 1037   TRIG 170.0 (H) 06/06/2022 1025   TRIG 245 (H) 01/13/2014 1037   HDL 34.90 (L) 06/06/2022 1025    HDL 41 04/09/2019 1540   HDL 41 01/13/2014 1037   CHOLHDL 6 06/06/2022 1025   VLDL 34.0 06/06/2022 1025   LDLCALC 126 (H) 06/06/2022 1025   LDLCALC 65 04/09/2019 1540   LDLCALC 126 (H) 01/13/2014 1037   LDLDIRECT 122.0 03/27/2018 0852    Physical Exam:    VS:  BP 132/71 (BP Location: Left Arm, Patient Position: Sitting)   Pulse (!) 59   Ht 6' (1.829 m)   Wt 186 lb  3.2 oz (84.5 kg)   SpO2 99%   BMI 25.25 kg/m     Wt Readings from Last 3 Encounters:  01/16/24 186 lb 3.2 oz (84.5 kg)  12/17/23 188 lb 1.9 oz (85.3 kg)  11/15/23 188 lb (85.3 kg)     GEN:  Well nourished, well developed in no acute distress HEENT: Normal NECK: No JVD; No carotid bruits LYMPHATICS: No lymphadenopathy CARDIAC: RRR, no murmurs, rubs, gallops RESPIRATORY:  Clear to auscultation without rales, wheezing or rhonchi  ABDOMEN: Soft, non-tender, non-distended MUSCULOSKELETAL:  No edema; No deformity  SKIN: Warm and dry NEUROLOGIC:  Alert and oriented x 3 PSYCHIATRIC:  Normal affect   ASSESSMENT:    1. Paroxysmal atrial fibrillation (HCC)   2. Hyperlipidemia, unspecified hyperlipidemia type   3. Chronic ITP (idiopathic thrombocytopenia) (HCC)   4. History of CVA (cerebrovascular accident)    PLAN:    Paroxysmal atrial fibrillation: CHA2DS2-VASc 4 (age x 2, CVA).  Echocardiogram 10/2023 showed EF 60 to 65%, normal RV function, no significant valvular disease. - He has been on Eliquis  but reports is too expensive.  Discussed warfarin, but he was not interested.  Will see if dabigatran will be cheaper for him - Continue diltiazem  30 mg every 6 hours as needed - Recommend Zio patch x 2 weeks to quantify A-fib burden  CVA: Continue Eliquis , statin  Hyperlipidemia: On atorvastatin  40 mg daily.  Update lipid panel  ITP: Platelets 80 on 12/17/2023.  Check CBC  RTC in 6 months  Medication Adjustments/Labs and Tests Ordered: Current medicines are reviewed at length with the patient today.  Concerns  regarding medicines are outlined above.  Orders Placed This Encounter  Procedures   CBC   Basic Metabolic Panel (BMET)   Lipid panel   LONG TERM MONITOR (3-14 DAYS)   Meds ordered this encounter  Medications   dabigatran (PRADAXA) 150 MG CAPS capsule    Sig: Take 1 capsule (150 mg total) by mouth 2 (two) times daily.    Dispense:  60 capsule    Refill:  3    Patient Instructions  Medication Instructions:  - Start dabigatran(PRADAXA) 150 MG TWICE DAILY    *If you need a refill on your cardiac medications before your next appointment, please call your pharmacy*   Lab Work: CBC BMET  LIPIDS   If you have labs (blood work) drawn today and your tests are completely normal, you will receive your results only by: MyChart Message (if you have MyChart) OR A paper copy in the mail If you have any lab test that is abnormal or we need to change your treatment, we will call you to review the results.   Testing/Procedures: GEOFFRY HEWS- Long Term Monitor Instructions  Your physician has requested you wear a ZIO patch monitor for 14 days.  This is a single patch monitor. [employer] supplies one patch monitor per enrollment. Additional stickers are not available. Please do not apply patch if you will be having a Nuclear Stress Test,  Echocardiogram, Cardiac CT, MRI, or Chest Xray during the period you would be wearing the  monitor. The patch cannot be worn during these tests. You cannot remove and re-apply the  ZIO XT patch monitor.  Your ZIO patch monitor will be mailed 3 day USPS to your address on file. It may take 3-5 days  to receive your monitor after you have been enrolled.  Once you have received your monitor, please review the enclosed instructions. Your monitor  has already been  registered assigning a specific monitor serial # to you.  Billing and Patient Assistance Program Information  We have supplied [employer] with any of your insurance information on file for billing  purposes. [employer] offers a sliding scale Patient Assistance Program for patients that do not have  insurance, or whose insurance does not completely cover the cost of the ZIO monitor.  You must apply for the Patient Assistance Program to qualify for this discounted rate.  To apply, please call [employer] at [phone], select option 4, select option 2, ask to apply for  Patient Assistance Program. Meredeth will ask your household income, and how many people  are in your household. They will quote your out-of-pocket cost based on that information.  [employer] will also be able to set up a 58-month, interest-free payment plan if needed.  Applying the monitor   Shave hair from upper left chest.  Hold abrader disc by orange tab. Rub abrader in 40 strokes over the upper left chest as  indicated in your monitor instructions.  Clean area with 4 enclosed alcohol pads. Let dry.  Apply patch as indicated in monitor instructions. Patch will be placed under collarbone on left  side of chest with arrow pointing upward.  Rub patch adhesive wings for 2 minutes. Remove white label marked 1. Remove the white  label marked 2. Rub patch adhesive wings for 2 additional minutes.  While looking in a mirror, press and release button in center of patch. A small green light will  flash 3-4 times. This will be your only indicator that the monitor has been turned on.  Do not shower for the first 24 hours. You may shower after the first 24 hours.  Press the button if you feel a symptom. You will hear a small click. Record Date, Time and  Symptom in the Patient Logbook.  When you are ready to remove the patch, follow instructions on the last 2 pages of Patient  Logbook. Stick patch monitor onto the last page of Patient Logbook.  Place Patient Logbook in the blue and white box. Use locking tab on box and tape box closed  securely. The blue and white box has prepaid postage on it. Please place it in the mailbox as  soon  as possible. Your physician should have your test results approximately 7 days after the  monitor has been mailed back to [facility].  Call [facility] Customer Care at [phone] if you have questions regarding  your ZIO XT patch monitor. Call them immediately if you see an orange light blinking on your  monitor.  If your monitor falls off in less than 4 days, contact our Monitor department at [phone].  If your monitor becomes loose or falls off after 4 days call [employer] at [phone] for  suggestions on securing your monitor    Follow-Up: At [facility], you and your health needs are our priority.  As part of our continuing mission to provide you with exceptional heart care, we have created designated Provider Care Teams.  These Care Teams include your primary Cardiologist (physician) and Advanced Practice Providers (APPs -  Physician Assistants and Nurse Practitioners) who all work together to provide you with the care you need, when you need it.  We recommend signing up for the patient portal called MyChart.  Sign up information is provided on this After Visit Summary.  MyChart is used to connect with patients for Virtual Visits (Telemedicine).  Patients are able to view lab/test results, encounter notes, upcoming  appointments, etc.  Non-urgent messages can be sent to your provider as well.   To learn more about what you can do with MyChart, go to [email or web address].    Your next appointment:   6 month(s)  The format for your next appointment:   In Person  Provider:   Lonni Nanas, MD    Other Instructions     Signed, Lonni LITTIE Nanas, MD  01/16/2024 8:40 AM    [facility]

## 2024-01-16 ENCOUNTER — Encounter: Payer: Self-pay | Admitting: Cardiology

## 2024-01-16 ENCOUNTER — Ambulatory Visit: Attending: Cardiology | Admitting: Cardiology

## 2024-01-16 ENCOUNTER — Ambulatory Visit

## 2024-01-16 VITALS — BP 132/71 | HR 59 | Ht 72.0 in | Wt 186.2 lb

## 2024-01-16 DIAGNOSIS — I480 Paroxysmal atrial fibrillation: Secondary | ICD-10-CM

## 2024-01-16 DIAGNOSIS — Z8673 Personal history of transient ischemic attack (TIA), and cerebral infarction without residual deficits: Secondary | ICD-10-CM

## 2024-01-16 DIAGNOSIS — E785 Hyperlipidemia, unspecified: Secondary | ICD-10-CM

## 2024-01-16 DIAGNOSIS — D693 Immune thrombocytopenic purpura: Secondary | ICD-10-CM | POA: Diagnosis not present

## 2024-01-16 MED ORDER — DABIGATRAN ETEXILATE MESYLATE 150 MG PO CAPS: 150.0000 mg | ORAL_CAPSULE | Freq: Two times a day (BID) | ORAL | 3 refills | Status: DC

## 2024-01-16 NOTE — Progress Notes (Unsigned)
 Applied a 14 day Zio XT monitor to patient in the office ?

## 2024-01-16 NOTE — Patient Instructions (Addendum)
 Medication Instructions:  - Start dabigatran(PRADAXA) 150 MG TWICE DAILY    *If you need a refill on your cardiac medications before your next appointment, please call your pharmacy*   Lab Work: CBC BMET  LIPIDS   If you have labs (blood work) drawn today and your tests are completely normal, you will receive your results only by: MyChart Message (if you have MyChart) OR A paper copy in the mail If you have any lab test that is abnormal or we need to change your treatment, we will call you to review the results.   Testing/Procedures: GEOFFRY HEWS- Long Term Monitor Instructions  Your physician has requested you wear a ZIO patch monitor for 14 days.  This is a single patch monitor. [employer] supplies one patch monitor per enrollment. Additional stickers are not available. Please do not apply patch if you will be having a Nuclear Stress Test,  Echocardiogram, Cardiac CT, MRI, or Chest Xray during the period you would be wearing the  monitor. The patch cannot be worn during these tests. You cannot remove and re-apply the  ZIO XT patch monitor.  Your ZIO patch monitor will be mailed 3 day USPS to your address on file. It may take 3-5 days  to receive your monitor after you have been enrolled.  Once you have received your monitor, please review the enclosed instructions. Your monitor  has already been registered assigning a specific monitor serial # to you.  Billing and Patient Assistance Program Information  We have supplied [employer] with any of your insurance information on file for billing purposes. [employer] offers a sliding scale Patient Assistance Program for patients that do not have  insurance, or whose insurance does not completely cover the cost of the ZIO monitor.  You must apply for the Patient Assistance Program to qualify for this discounted rate.  To apply, please call [employer] at [phone], select option 4, select option 2, ask to apply for  Patient Assistance Program.  Meredeth will ask your household income, and how many people  are in your household. They will quote your out-of-pocket cost based on that information.  [employer] will also be able to set up a 50-month, interest-free payment plan if needed.  Applying the monitor   Shave hair from upper left chest.  Hold abrader disc by orange tab. Rub abrader in 40 strokes over the upper left chest as  indicated in your monitor instructions.  Clean area with 4 enclosed alcohol pads. Let dry.  Apply patch as indicated in monitor instructions. Patch will be placed under collarbone on left  side of chest with arrow pointing upward.  Rub patch adhesive wings for 2 minutes. Remove white label marked 1. Remove the white  label marked 2. Rub patch adhesive wings for 2 additional minutes.  While looking in a mirror, press and release button in center of patch. A small green light will  flash 3-4 times. This will be your only indicator that the monitor has been turned on.  Do not shower for the first 24 hours. You may shower after the first 24 hours.  Press the button if you feel a symptom. You will hear a small click. Record Date, Time and  Symptom in the Patient Logbook.  When you are ready to remove the patch, follow instructions on the last 2 pages of Patient  Logbook. Stick patch monitor onto the last page of Patient Logbook.  Place Patient Logbook in the blue and white box. Use locking tab on  box and tape box closed  securely. The blue and white box has prepaid postage on it. Please place it in the mailbox as  soon as possible. Your physician should have your test results approximately 7 days after the  monitor has been mailed back to [facility].  Call [facility] Customer Care at [phone] if you have questions regarding  your ZIO XT patch monitor. Call them immediately if you see an orange light blinking on your  monitor.  If your monitor falls off in less than 4 days, contact our Monitor  department at [phone].  If your monitor becomes loose or falls off after 4 days call [employer] at [phone] for  suggestions on securing your monitor    Follow-Up: At [facility], you and your health needs are our priority.  As part of our continuing mission to provide you with exceptional heart care, we have created designated Provider Care Teams.  These Care Teams include your primary Cardiologist (physician) and Advanced Practice Providers (APPs -  Physician Assistants and Nurse Practitioners) who all work together to provide you with the care you need, when you need it.  We recommend signing up for the patient portal called MyChart.  Sign up information is provided on this After Visit Summary.  MyChart is used to connect with patients for Virtual Visits (Telemedicine).  Patients are able to view lab/test results, encounter notes, upcoming appointments, etc.  Non-urgent messages can be sent to your provider as well.   To learn more about what you can do with MyChart, go to [email or web address].    Your next appointment:   6 month(s)  The format for your next appointment:   In Person  Provider:   Lonni Nanas, MD    Other Instructions

## 2024-01-17 LAB — BASIC METABOLIC PANEL WITH GFR
BUN/Creatinine Ratio: 17 (ref 10–24)
BUN: 17 mg/dL (ref 10–36)
CO2: 21 mmol/L (ref 20–29)
Calcium: 9.4 mg/dL (ref 8.6–10.2)
Chloride: 104 mmol/L (ref 96–106)
Creatinine, Ser: 1.03 mg/dL (ref 0.76–1.27)
Glucose: 93 mg/dL (ref 70–99)
Potassium: 4.6 mmol/L (ref 3.5–5.2)
Sodium: 141 mmol/L (ref 134–144)
eGFR: 69 mL/min/{1.73_m2} (ref >59)

## 2024-01-17 LAB — CBC
Hematocrit: 40.6 % (ref 37.5–51.0)
Hemoglobin: 13.3 g/dL (ref 13.0–17.7)
MCH: 31.7 pg (ref 26.6–33.0)
MCHC: 32.8 g/dL (ref 31.5–35.7)
MCV: 97 fL (ref 79–97)
Platelets: 90 10*3/uL — CL (ref 150–450)
RBC: 4.2 x10E6/uL (ref 4.14–5.80)
RDW: 13.9 % (ref 11.6–15.4)
WBC: 4.1 10*3/uL (ref 3.4–10.8)

## 2024-01-17 LAB — LIPID PANEL
Chol/HDL Ratio: 3.2 ratio (ref 0.0–5.0)
Cholesterol, Total: 145 mg/dL (ref 100–199)
HDL: 46 mg/dL (ref >39)
LDL Chol Calc (NIH): 79 mg/dL (ref 0–99)
Triglycerides: 109 mg/dL (ref 0–149)
VLDL Cholesterol Cal: 20 mg/dL (ref 5–40)

## 2024-01-19 ENCOUNTER — Ambulatory Visit: Payer: Self-pay | Admitting: Cardiology

## 2024-01-20 ENCOUNTER — Other Ambulatory Visit (HOSPITAL_COMMUNITY): Payer: Self-pay

## 2024-01-20 ENCOUNTER — Telehealth: Payer: Self-pay | Admitting: Pharmacy Technician

## 2024-01-20 DIAGNOSIS — I480 Paroxysmal atrial fibrillation: Secondary | ICD-10-CM

## 2024-01-20 NOTE — Telephone Encounter (Signed)
 Received notification that pradaxa  needs a prior auth. Per test claim for the brand and generic of pradaxa : product not covered/benefit exclusion   Per 01/16/24 encounter: Paroxysmal atrial fibrillation: CHA2DS2-VASc 4 (age x 2, CVA). Echocardiogram 10/2023 showed EF 60 to 65%, normal RV function, no significant valvular disease. - He has been on eliquis  but reports is too expensive. Discussed warfarin, but he was not interested. Will see if dabigatran  will be cheaper for him  Ran a test claim for xarelto 10mg  and copay came back 47.00 for 30 days. But to be noted:The eliquis  also came back as 47.00 for 30 days as well per test claim. I called [facility] to ask what he paid in February when he last got eliquis  and they said he paid 396.00 for 90 days. I called the patient and left him a message to see if the 47.00 a month would be affordable and then he could go back to eliquis  instead of changing to the pradaxa  that his insurance won't cover.

## 2024-01-21 MED ORDER — APIXABAN 5 MG PO TABS: 5.0000 mg | ORAL_TABLET | Freq: Two times a day (BID) | ORAL | 5 refills | Status: DC

## 2024-01-21 NOTE — Telephone Encounter (Signed)
 Prescription refill request for Eliquis  received. Indication: PAF Last office visit: 01/16/24 Scr: 1.03 Age: 89 Weight: 84kg

## 2024-01-21 NOTE — Telephone Encounter (Signed)
 Rx sent to [facility]

## 2024-01-21 NOTE — Telephone Encounter (Signed)
 I called and spoke to the wife and they are ok with the eliquis  at 47.00 a month. They are asking if the pradaxa  can be cancelled since the insurance wont cover it and asking if the eliquis  can be sent in to there [facility] pharmacy instead please and thank you!

## 2024-02-06 DIAGNOSIS — I480 Paroxysmal atrial fibrillation: Secondary | ICD-10-CM | POA: Diagnosis not present

## 2024-02-15 DIAGNOSIS — I480 Paroxysmal atrial fibrillation: Secondary | ICD-10-CM

## 2024-03-18 ENCOUNTER — Encounter: Payer: Self-pay | Admitting: Medical Oncology

## 2024-03-18 ENCOUNTER — Inpatient Hospital Stay (HOSPITAL_BASED_OUTPATIENT_CLINIC_OR_DEPARTMENT_OTHER): Admitting: Medical Oncology

## 2024-03-18 ENCOUNTER — Inpatient Hospital Stay: Attending: Medical Oncology

## 2024-03-18 VITALS — BP 127/55 | HR 56 | Temp 98.0°F | Resp 18 | Wt 191.1 lb

## 2024-03-18 DIAGNOSIS — D696 Thrombocytopenia, unspecified: Secondary | ICD-10-CM

## 2024-03-18 DIAGNOSIS — D693 Immune thrombocytopenic purpura: Secondary | ICD-10-CM | POA: Insufficient documentation

## 2024-03-18 DIAGNOSIS — Z7901 Long term (current) use of anticoagulants: Secondary | ICD-10-CM | POA: Insufficient documentation

## 2024-03-18 LAB — CMP (CANCER CENTER ONLY)
ALT: 10 U/L (ref 0–44)
AST: 20 U/L (ref 15–41)
Albumin: 4.6 g/dL (ref 3.5–5.0)
Alkaline Phosphatase: 46 U/L (ref 38–126)
Anion gap: 13 (ref 5–15)
BUN: 21 mg/dL (ref 8–23)
CO2: 25 mmol/L (ref 22–32)
Calcium: 9.5 mg/dL (ref 8.9–10.3)
Chloride: 106 mmol/L (ref 98–111)
Creatinine: 1.28 mg/dL — ABNORMAL HIGH (ref 0.61–1.24)
GFR, Estimated: 53 mL/min — ABNORMAL LOW (ref >60)
Glucose, Bld: 86 mg/dL (ref 70–99)
Potassium: 4.1 mmol/L (ref 3.5–5.1)
Sodium: 143 mmol/L (ref 135–145)
Total Bilirubin: 1 mg/dL (ref 0.0–1.2)
Total Protein: 7.3 g/dL (ref 6.5–8.1)

## 2024-03-18 LAB — CBC
HCT: 36.6 % — ABNORMAL LOW (ref 39.0–52.0)
Hemoglobin: 12.4 g/dL — ABNORMAL LOW (ref 13.0–17.0)
MCH: 32 pg (ref 26.0–34.0)
MCHC: 33.9 g/dL (ref 30.0–36.0)
MCV: 94.6 fL (ref 80.0–100.0)
Platelets: 86 K/uL — ABNORMAL LOW (ref 150–400)
RBC: 3.87 MIL/uL — ABNORMAL LOW (ref 4.22–5.81)
RDW: 13.1 % (ref 11.5–15.5)
WBC: 4.7 K/uL (ref 4.0–10.5)
nRBC: 0 % (ref 0.0–0.2)

## 2024-03-18 LAB — LACTATE DEHYDROGENASE: LDH: 244 U/L — ABNORMAL HIGH (ref 98–192)

## 2024-03-18 NOTE — Progress Notes (Signed)
 Hematology and Oncology Follow Up Visit  Chad Norris [id number] 1932-07-09 89 y.o. 03/18/2024   Principle Diagnosis:  Chronic ITP  Current Therapy:   Observation   Interim History:  Chad Norris is here today for follow up of his thrombocytopenia:  He states that he is doing really well. He had a quite celebration of his 89th birthday which was fine for him. He is the only living sibling of 9 children. His mom lived to be almost 40 years old. His father lived to be in his late 82s.  No recent falls He does have history of stroke with left leg weakness.  He has A. Fib for which he takes Eliquis  and Cardizem  He has occasional blurry vision lasting only a few minutes.  No new falls, no syncope.  No swelling, tenderness, numbness or tingling in his extremities. No fever, chills, n/v, cough, rash, SOB, chest pain, palpitations, abdominal pain or changes in bowel or bladder habits.   Appetite is good Wt Readings from Last 3 Encounters:  03/18/24 191 lb 1.3 oz (86.7 kg)  01/16/24 186 lb 3.2 oz (84.5 kg)  12/17/23 188 lb 1.9 oz (85.3 kg)   ECOG Performance Status: 1 - Symptomatic but completely ambulatory  Medications:  Allergies as of 03/18/2024   No Known Allergies      Medication List        Accurate as of March 18, 2024 12:49 PM. If you have any questions, ask your nurse or doctor.          acetaminophen  325 MG tablet Commonly known as: Tylenol  Take 2 tablets (650 mg total) by mouth every 6 (six) hours as needed for moderate pain.   Adult One Daily Gummies Chew Chew 2 tablets by mouth in the morning. Vitafusion Adult Gummy Vitamins for Men   apixaban  5 MG Tabs tablet Commonly known as: ELIQUIS  Take 1 tablet (5 mg total) by mouth 2 (two) times daily.   atorvastatin  40 MG tablet Commonly known as: LIPITOR TAKE 1 TABLET(40 MG) BY MOUTH DAILY   diltiazem  30 MG tablet Commonly known as: CARDIZEM  Take 1 tablet (30 mg total) by mouth every 6 (six) hours as  needed (for heart rate greater than 100 bpm.).   HYDROcodone -acetaminophen  5-325 MG tablet Commonly known as: NORCO/VICODIN Take 1-2 tablets by mouth every 6 (six) hours as needed for moderate pain (pain score 4-6).   methocarbamol  500 MG tablet Commonly known as: ROBAXIN  Take 1 tablet (500 mg total) by mouth every 6 (six) hours as needed for muscle spasms.   triamcinolone  cream 0.1 % Commonly known as: KENALOG  APPLY TOPICALLY TO THE AFFECTED AREA TWICE DAILY   VITAMIN D3 PO Take 1 tablet by mouth in the morning.        Allergies: No Known Allergies  Past Medical History, Surgical history, Social history, and Family History were reviewed and updated.  Review of Systems: All other 10 point review of systems is negative.   Physical Exam:  weight is 191 lb 1.3 oz (86.7 kg). His oral temperature is 98 F (36.7 C). His blood pressure is 127/55 (abnormal) and his pulse is 56 (abnormal). His respiration is 18 and oxygen saturation is 100%.   Wt Readings from Last 3 Encounters:  03/18/24 191 lb 1.3 oz (86.7 kg)  01/16/24 186 lb 3.2 oz (84.5 kg)  12/17/23 188 lb 1.9 oz (85.3 kg)    Ocular: Sclerae unicteric, pupils equal, round and reactive to light Ear-nose-throat: Oropharynx clear, dentition fair Lymphatic: No  cervical or supraclavicular adenopathy Lungs no rales or rhonchi, good excursion bilaterally Heart regular rate and rhythm, no murmur appreciated Abd soft, nontender, positive bowel sounds MSK no focal spinal tenderness, no joint edema Neuro: non-focal, well-oriented, appropriate affect   Lab Results  Component Value Date   WBC 4.7 03/18/2024   HGB 12.4 (L) 03/18/2024   HCT 36.6 (L) 03/18/2024   MCV 94.6 03/18/2024   PLT 86 (L) 03/18/2024   Lab Results  Component Value Date   FERRITIN 307 07/06/2021   IRON 93 07/06/2021   TIBC 262 07/06/2021   IRONPCTSAT 35 07/06/2021   Lab Results  Component Value Date   RBC 3.87 (L) 03/18/2024   No results found  for: KPAFRELGTCHN, LAMBDASER, KAPLAMBRATIO No results found for: IGGSERUM, IGA, IGMSERUM No results found for: STEPHANY CARLOTA BENSON Chad Norris Chad Norris Chad Norris Chad Norris, Chad Norris   Chemistry      Component Value Date/Time   NA 143 03/18/2024 1019   NA 141 01/16/2024 0928   K 4.1 03/18/2024 1019   CL 106 03/18/2024 1019   CO2 25 03/18/2024 1019   BUN 21 03/18/2024 1019   BUN 17 01/16/2024 0928   CREATININE 1.28 (H) 03/18/2024 1019      Component Value Date/Time   CALCIUM  9.5 03/18/2024 1019   ALKPHOS 46 03/18/2024 1019   AST 20 03/18/2024 1019   ALT 10 03/18/2024 1019   BILITOT 1.0 03/18/2024 1019     Encounter Diagnosis  Name Primary?   Thrombocytopenia (HCC) Yes   Impression and Plan:  Chad Norris is a very pleasant 89 yo caucasian gentleman with thrombocytopenia first noted in September 2022- suggestive chronic ITP on previous smear. He is on Eliquis  for A. Fib for his history of stroke. We reduced his follow up from 6 months to 3 months after his last set of labs showed a drop in his platelets from a baseline of 100 to 80.   Hgb is 12.4 Platelets are stable with a value of 86.  Continue Eliquis  at this time however we may reduce dose or hold if platelets reduce below 50 or he have any bleeding events  RTC 6 months APP/MD, labs (CBC w/, CMP, save smear, LDH)-Chad Norris   Lauraine CHRISTELLA Dais, PA-C [phone] PM

## 2024-03-24 ENCOUNTER — Other Ambulatory Visit: Payer: Self-pay | Admitting: Internal Medicine

## 2024-03-24 DIAGNOSIS — E7849 Other hyperlipidemia: Secondary | ICD-10-CM

## 2024-07-06 ENCOUNTER — Other Ambulatory Visit: Payer: Self-pay | Admitting: Internal Medicine

## 2024-07-06 DIAGNOSIS — E7849 Other hyperlipidemia: Secondary | ICD-10-CM

## 2024-07-09 ENCOUNTER — Ambulatory Visit: Admitting: Orthopaedic Surgery

## 2024-07-13 ENCOUNTER — Other Ambulatory Visit (INDEPENDENT_AMBULATORY_CARE_PROVIDER_SITE_OTHER): Payer: Self-pay

## 2024-07-13 ENCOUNTER — Ambulatory Visit: Admitting: Orthopaedic Surgery

## 2024-07-13 ENCOUNTER — Encounter: Payer: Self-pay | Admitting: Orthopaedic Surgery

## 2024-07-13 DIAGNOSIS — Z96642 Presence of left artificial hip joint: Secondary | ICD-10-CM

## 2024-07-13 NOTE — Progress Notes (Signed)
 The patient is a 89 year old gentleman who is now 8 months status post a left total hip replacement to treat significant left hip pain and arthritis.  He says the hip is doing great he has no issues with it at all.  He is walking without assistive device.  He gets an occasional pain in his right hip but nothing significant.  He is very pleased and satisfied.  He is walking with a normal gait.  His leg lengths are equal.  He tolerates us  easily putting his left hip through rotation.  His right hip also has good rotation with minimal discomfort.  An AP pelvis and lateral the left hip shows a well-seated left total hip arthroplasty with no complicating features.  The right hip joint space is narrowing but is not severe yet.  At this point follow-up for his hip can be as needed.  If he does develop any issues with his left hip or his right hip or other orthopedic issues he knows to reach out to us .  All questions and concerns were addressed and answered.

## 2024-07-19 NOTE — Progress Notes (Unsigned)
 " Cardiology Office Note:    Date:  07/21/2024   ID:  Chad Norris, DOB 01-Nov-1931, MRN [id number]  PCP:  Geofm Glade PARAS, MD  Cardiologist:  None  Electrophysiologist:  None   Referring MD: Geofm Glade PARAS, MD   Chief Complaint  Patient presents with   Atrial Fibrillation    History of Present Illness:    Chad Norris is a 89 y.o. male with a hx of paroxysmal atrial fibrillation, CVA, ITP, hyperlipidemia who presents for follow-up.  Previously followed with Dr. Alvan.  Echocardiogram 10/2023 showed EF 60 to 65%, normal RV function, no significant valvular disease.  Zio patch x 14 days 01/2024 showed no significant arrhythmias.  Since last clinic visit, he reports some lightheadedness but denies any syncope.  States the main issue is he feels tired all the time.  He is taking Eliquis , denies any bleeding issues.  Denies any chest pain or dyspnea.  Denies any lower extremity edema or palpitations.  He walks about 1.5 miles in the morning.  Past Medical History:  Diagnosis Date   Arthritis    Atrial fibrillation (HCC)    Colon polyps    COVID-19    Diverticulosis    Gout    Hemorrhoids    Hyperlipemia    Nephrolithiasis    Pneumonia    Prostate cancer (HCC)    Stroke [facility])     Past Surgical History:  Procedure Laterality Date   CATARACT EXTRACTION W/ INTRAOCULAR LENS  IMPLANT, BILATERAL     bi-lat   CERVICAL SPINE SURGERY     COLONOSCOPY W/ POLYPECTOMY     Epidural steroid injections     3-4, Dr Colon   RETROPUBIC PROSTATECTOMY     SHOULDER SURGERY     left   TOTAL HIP ARTHROPLASTY Left 11/15/2023   Procedure: ARTHROPLASTY, HIP, TOTAL, ANTERIOR APPROACH;  Surgeon: Vernetta Lonni GRADE, MD;  Location: [facility];  Service: Orthopedics;  Laterality: Left;    Current Medications: Current Meds  Medication Sig   acetaminophen  (TYLENOL ) 325 MG tablet Take 2 tablets (650 mg total) by mouth every 6 (six) hours as needed for moderate pain.   apixaban  (ELIQUIS ) 5  MG TABS tablet Take 1 tablet (5 mg total) by mouth 2 (two) times daily.   atorvastatin  (LIPITOR) 40 MG tablet TAKE 1 TABLET(40 MG) BY MOUTH DAILY   diltiazem  (CARDIZEM ) 30 MG tablet Take 1 tablet (30 mg total) by mouth every 6 (six) hours as needed (for heart rate greater than 100 bpm.).   Multiple Vitamins-Minerals (ADULT ONE DAILY GUMMIES) CHEW Chew 2 tablets by mouth in the morning. Vitafusion Adult Gummy Vitamins for Men   triamcinolone  cream (KENALOG ) 0.1 % APPLY TOPICALLY TO THE AFFECTED AREA TWICE DAILY     Allergies:   Patient has no known allergies.   Social History   Socioeconomic History   Marital status: Married    Spouse name: Not on file   Number of children: 2   Years of education: Not on file   Highest education level: 12th grade  Occupational History   Occupation: retired  Tobacco Use   Smoking status: Never   Smokeless tobacco: Never  Vaping Use   Vaping status: Never Used  Substance and Sexual Activity   Alcohol use: Not Currently    Alcohol/week: 1.0 standard drink of alcohol    Types: 1 Cans of beer per week   Drug use: No   Sexual activity: Not Currently  Other Topics Concern  Not on file  Social History Narrative   Not on file   Social Drivers of Health   Tobacco Use: Low Risk (07/21/2024)   Patient History    Smoking Tobacco Use: Never    Smokeless Tobacco Use: Never    Passive Exposure: Not on file  Financial Resource Strain: Low Risk (10/26/2022)   Overall Financial Resource Strain (CARDIA)    Difficulty of Paying Living Expenses: Not very hard  Food Insecurity: No Food Insecurity (11/15/2023)   Hunger Vital Sign    Worried About Running Out of Food in the Last Year: Never true    Ran Out of Food in the Last Year: Never true  Transportation Needs: No Transportation Needs (11/15/2023)   PRAPARE - Administrator, Civil Service (Medical): No    Lack of Transportation (Non-Medical): No  Physical Activity: Insufficiently Active  (10/26/2022)   Exercise Vital Sign    Days of Exercise per Week: 1 day    Minutes of Exercise per Session: 10 min  Stress: No Stress Concern Present (10/26/2022)   [employer] of Occupational Health - Occupational Stress Questionnaire    Feeling of Stress : Only a little  Social Connections: Socially Integrated (11/15/2023)   Social Connection and Isolation Panel    Frequency of Communication with Friends and Family: More than three times a week    Frequency of Social Gatherings with Friends and Family: More than three times a week    Attends Religious Services: 1 to 4 times per year    Active Member of Clubs or Organizations: Yes    Attends Banker Meetings: 1 to 4 times per year    Marital Status: Married  Depression (PHQ2-9): Low Risk (03/18/2024)   Depression (PHQ2-9)    PHQ-2 Score: 0  Alcohol Screen: Low Risk (10/26/2022)   Alcohol Screen    Last Alcohol Screening Score (AUDIT): 1  Housing: Low Risk (11/15/2023)   Housing Stability Vital Sign    Unable to Pay for Housing in the Last Year: No    Number of Times Moved in the Last Year: 0    Homeless in the Last Year: No  Utilities: Not At Risk (11/15/2023)   AHC Utilities    Threatened with loss of utilities: No  Health Literacy: Not on file     Family History: The patient's family history includes Alcohol abuse in his brother; Breast cancer in his sister; Cancer in his brother; Colon cancer in his father; Colon polyps in his brother; Diabetes in his brother; Heart attack (age of onset: 56) in his brother; Heart attack (age of onset: 49) in his sister; Kidney cancer in his father.  ROS:   Please see the history of present illness.     All other systems reviewed and are negative.  EKGs/Labs/Other Studies Reviewed:    The following studies were reviewed today:   EKG:   07/21/2024: Sinus bradycardia, rate 49, left bundle branch block  Recent Labs: 11/18/2023: Magnesium 2.2; TSH 1.730 03/18/2024: ALT 10; BUN  21; Creatinine 1.28; Hemoglobin 12.4; Platelets 86; Potassium 4.1; Sodium 143  Recent Lipid Panel    Component Value Date/Time   CHOL 145 01/16/2024 0929   CHOL 216 (H) 01/13/2014 1037   TRIG 109 01/16/2024 0929   TRIG 245 (H) 01/13/2014 1037   HDL 46 01/16/2024 0929   HDL 41 01/13/2014 1037   CHOLHDL 3.2 01/16/2024 0929   CHOLHDL 6 06/06/2022 1025   VLDL 34.0 06/06/2022 1025  LDLCALC 79 01/16/2024 0929   LDLCALC 126 (H) 01/13/2014 1037   LDLDIRECT 122.0 03/27/2018 0852    Physical Exam:    VS:  BP 134/72 (BP Location: Left Arm, Patient Position: Sitting, Cuff Size: Normal)   Pulse (!) 49   Ht 6' (1.829 m)   Wt 200 lb 9.6 oz (91 kg)   SpO2 98%   BMI 27.21 kg/m     Wt Readings from Last 3 Encounters:  07/21/24 200 lb 9.6 oz (91 kg)  03/18/24 191 lb 1.3 oz (86.7 kg)  01/16/24 186 lb 3.2 oz (84.5 kg)     GEN:  Well nourished, well developed in no acute distress HEENT: Normal NECK: No JVD; No carotid bruits LYMPHATICS: No lymphadenopathy CARDIAC: RRR, no murmurs, rubs, gallops RESPIRATORY:  Clear to auscultation without rales, wheezing or rhonchi  ABDOMEN: Soft, non-tender, non-distended MUSCULOSKELETAL:  No edema; No deformity  SKIN: Warm and dry NEUROLOGIC:  Alert and oriented x 3 PSYCHIATRIC:  Normal affect   ASSESSMENT:    1. Paroxysmal atrial fibrillation (HCC)   2. Hyperlipidemia, unspecified hyperlipidemia type   3. Chronic ITP (idiopathic thrombocytopenia) (HCC)   4. History of CVA (cerebrovascular accident)     PLAN:    Paroxysmal atrial fibrillation: AF with RVR in 10/2023. CHA2DS2-VASc 4 (age x 2, CVA).  Echocardiogram 10/2023 showed EF 60 to 65%, normal RV function, no significant valvular disease. Zio patch x 14 days 01/2024 showed no significant arrhythmias. - Continue Eliquis  - Having tachybrady syndrome, has A-fib with RVR but baseline bradycardia, resting heart rate in high 40s today.  Reports feeling very fatigued and intermittent lightheadedness,  may be related to bradycardia.  On as needed diltiazem , would not start scheduled AV nodal blocker given bradycardia.  May ultimately need pacemaker if having tachybrady issues.  Will refer to EP for evaluation  CVA: Continue Eliquis , statin  Hyperlipidemia: On atorvastatin  40 mg daily.  LDL 79 12/2023  ITP: Platelets 86 on 02/2024.  Check CBC  RTC in 6 months  Medication Adjustments/Labs and Tests Ordered: Current medicines are reviewed at length with the patient today.  Concerns regarding medicines are outlined above.  Orders Placed This Encounter  Procedures   Basic Metabolic Panel (BMET)   CBC   Ambulatory referral to Cardiac Electrophysiology   EKG 12-Lead   No orders of the defined types were placed in this encounter.   Patient Instructions  Medication Instructions:  Your physician recommends that you continue on your current medications as directed. Please refer to the Current Medication list given to you today. *If you need a refill on your cardiac medications before your next appointment, please call your pharmacy*  Lab Work: TODAY-BMET & CBC If you have labs (blood work) drawn today and your tests are completely normal, you will receive your results only by: MyChart Message (if you have MyChart) OR A paper copy in the mail If you have any lab test that is abnormal or we need to change your treatment, we will call you to review the results.  Testing/Procedures: NONE ORDERED  Follow-Up: At [facility], you and your health needs are our priority.  As part of our continuing mission to provide you with exceptional heart care, our providers are all part of one team.  This team includes your primary Cardiologist (physician) and Advanced Practice Providers or APPs (Physician Assistants and Nurse Practitioners) who all work together to provide you with the care you need, when you need it.  Your next appointment:  6 month(s)  Provider:   Lonni Nanas, MD     We recommend signing up for the patient portal called MyChart.  Sign up information is provided on this After Visit Summary.  MyChart is used to connect with patients for Virtual Visits (Telemedicine).  Patients are able to view lab/test results, encounter notes, upcoming appointments, etc.  Non-urgent messages can be sent to your provider as well.   To learn more about what you can do with MyChart, go to [email or web address].   Other Instructions You have been referred to EP CARDIOLOGY.            Signed, Lonni LITTIE Nanas, MD  07/21/2024 12:10 PM    [facility] "

## 2024-07-21 ENCOUNTER — Encounter: Payer: Self-pay | Admitting: Cardiology

## 2024-07-21 ENCOUNTER — Ambulatory Visit: Attending: Cardiology | Admitting: Cardiology

## 2024-07-21 VITALS — BP 134/72 | HR 49 | Ht 72.0 in | Wt 200.6 lb

## 2024-07-21 DIAGNOSIS — I480 Paroxysmal atrial fibrillation: Secondary | ICD-10-CM | POA: Diagnosis not present

## 2024-07-21 DIAGNOSIS — D693 Immune thrombocytopenic purpura: Secondary | ICD-10-CM | POA: Diagnosis not present

## 2024-07-21 DIAGNOSIS — E785 Hyperlipidemia, unspecified: Secondary | ICD-10-CM | POA: Diagnosis not present

## 2024-07-21 DIAGNOSIS — Z8673 Personal history of transient ischemic attack (TIA), and cerebral infarction without residual deficits: Secondary | ICD-10-CM | POA: Diagnosis not present

## 2024-07-21 NOTE — Patient Instructions (Addendum)
 Medication Instructions:  Your physician recommends that you continue on your current medications as directed. Please refer to the Current Medication list given to you today. *If you need a refill on your cardiac medications before your next appointment, please call your pharmacy*  Lab Work: TODAY-BMET & CBC If you have labs (blood work) drawn today and your tests are completely normal, you will receive your results only by: MyChart Message (if you have MyChart) OR A paper copy in the mail If you have any lab test that is abnormal or we need to change your treatment, we will call you to review the results.  Testing/Procedures: NONE ORDERED  Follow-Up: At [facility], you and your health needs are our priority.  As part of our continuing mission to provide you with exceptional heart care, our providers are all part of one team.  This team includes your primary Cardiologist (physician) and Advanced Practice Providers or APPs (Physician Assistants and Nurse Practitioners) who all work together to provide you with the care you need, when you need it.  Your next appointment:   6 month(s)  Provider:   Lonni Nanas, MD    We recommend signing up for the patient portal called MyChart.  Sign up information is provided on this After Visit Summary.  MyChart is used to connect with patients for Virtual Visits (Telemedicine).  Patients are able to view lab/test results, encounter notes, upcoming appointments, etc.  Non-urgent messages can be sent to your provider as well.   To learn more about what you can do with MyChart, go to [email or web address].   Other Instructions You have been referred to [facility].

## 2024-07-22 ENCOUNTER — Ambulatory Visit: Payer: Self-pay | Admitting: Cardiology

## 2024-07-22 LAB — CBC
Hematocrit: 38 % (ref 37.5–51.0)
Hemoglobin: 12.8 g/dL — ABNORMAL LOW (ref 13.0–17.7)
MCH: 32.6 pg (ref 26.6–33.0)
MCHC: 33.7 g/dL (ref 31.5–35.7)
MCV: 97 fL (ref 79–97)
Platelets: 78 x10E3/uL — CL (ref 150–450)
RBC: 3.93 x10E6/uL — ABNORMAL LOW (ref 4.14–5.80)
RDW: 12.5 % (ref 11.6–15.4)
WBC: 6.3 x10E3/uL (ref 3.4–10.8)

## 2024-07-22 LAB — BASIC METABOLIC PANEL WITH GFR
BUN/Creatinine Ratio: 17 (ref 10–24)
BUN: 22 mg/dL (ref 10–36)
CO2: 18 mmol/L — ABNORMAL LOW (ref 20–29)
Calcium: 9.3 mg/dL (ref 8.6–10.2)
Chloride: 105 mmol/L (ref 96–106)
Creatinine, Ser: 1.33 mg/dL — ABNORMAL HIGH (ref 0.76–1.27)
Glucose: 85 mg/dL (ref 70–99)
Potassium: 4.4 mmol/L (ref 3.5–5.2)
Sodium: 141 mmol/L (ref 134–144)
eGFR: 51 mL/min/1.73 — ABNORMAL LOW

## 2024-08-05 ENCOUNTER — Encounter: Payer: Self-pay | Admitting: Internal Medicine

## 2024-08-05 DIAGNOSIS — N1831 Chronic kidney disease, stage 3a: Secondary | ICD-10-CM | POA: Insufficient documentation

## 2024-08-05 NOTE — Patient Instructions (Addendum)
" ° ° ° ° ° ° °  Medications changes include :   None       Return in about 6 months (around 02/03/2025) for Physical Exam.  "

## 2024-08-05 NOTE — Progress Notes (Signed)
 "     Subjective:    Patient ID: Chad Norris, male    DOB: 09-17-1931, 89 y.o.   MRN: [id number]     HPI Chad Norris is here for follow up of his chronic medical problems.  Discussed the use of AI scribe software for clinical note transcription with the patient, who gave verbal consent to proceed.  History of Present Illness Chad Norris is a 89 year old male who presents for a routine follow-up.  He has a painful inner lip ulcer, which he attributes to accidentally hitting his lip and then biting it while eating. He has been gargling with salt water, which he finds helpful.  He feels easily tired, attributing this to his age. Despite this fatigue, he maintains a routine of waking up at 6 AM, making coffee, dressing, and walking up to a mile and a half daily. He experiences no shortness of breath, leg swelling, or chest pain, except for temporary congestion when exposed to cold weather.  He is currently taking Eliquis , two Tylenol  every morning and afternoon, a daily arthritis medication, and two gummy vitamins daily. He reports no issues with his medications.  He experiences episodes of atrial fibrillation, for which he takes medication that resolves the symptoms within an hour. He has been informed that his cardiologist is considering a pacemaker and that his heart rhythm is slow at times, but he has not been contacted yet regarding this.  He reports frequent nighttime urination, which he attributes to late fluid intake. He drinks about 22 to 30 ounces of water daily. No fever, heart racing, or significant dizziness, except during atrial fibrillation episodes.  He has no issues with his hip, which was previously operated on, and states that it is doing great.  He received a flu shot in April and is up to date with his vaccinations. He mentions easy bruising, which he attributes to his blood thinner medication. He has an upcoming appointment with an oncologist to  monitor his platelet count, which was noted to be low in recent blood work.     Medications and allergies reviewed with patient and updated if appropriate.  Medications Ordered Prior to Encounter[1]   Review of Systems  Constitutional:  Negative for fever.  Respiratory:  Negative for cough, shortness of breath and wheezing.   Cardiovascular:  Negative for chest pain, palpitations and leg swelling.  Gastrointestinal:  Negative for abdominal pain, blood in stool, constipation and diarrhea.       No gerd  Neurological:  Positive for dizziness (with Afib only). Negative for light-headedness and headaches.  Hematological:  Does not bruise/bleed easily.  Psychiatric/Behavioral:  Negative for sleep disturbance.        Objective:   Vitals:   08/06/24 1117  BP: 132/74  Pulse: 68  Temp: 98.1 F (36.7 C)  SpO2: 98%   BP Readings from Last 3 Encounters:  08/06/24 132/74  07/21/24 134/72  03/18/24 (!) 127/55   Wt Readings from Last 3 Encounters:  08/06/24 188 lb (85.3 kg)  07/21/24 200 lb 9.6 oz (91 kg)  03/18/24 191 lb 1.3 oz (86.7 kg)   Body mass index is 25.5 kg/m.    Physical Exam Constitutional:      General: He is not in acute distress.    Appearance: Normal appearance. He is not ill-appearing.  HENT:     Head: Normocephalic and atraumatic.  Eyes:     Conjunctiva/sclera: Conjunctivae normal.  Cardiovascular:     Rate and  Rhythm: Normal rate and regular rhythm.     Heart sounds: Normal heart sounds.  Pulmonary:     Effort: Pulmonary effort is normal. No respiratory distress.     Breath sounds: Normal breath sounds. No wheezing or rales.  Musculoskeletal:     Right lower leg: No edema.     Left lower leg: No edema.  Skin:    General: Skin is warm and dry.     Findings: No rash.  Neurological:     Mental Status: He is alert. Mental status is at baseline.  Psychiatric:        Mood and Affect: Mood normal.        Lab Results  Component Value Date   WBC  6.3 07/21/2024   HGB 12.8 (L) 07/21/2024   HCT 38.0 07/21/2024   PLT 78 (LL) 07/21/2024   GLUCOSE 85 07/21/2024   CHOL 145 01/16/2024   TRIG 109 01/16/2024   HDL 46 01/16/2024   LDLDIRECT 122.0 03/27/2018   LDLCALC 79 01/16/2024   ALT 10 03/18/2024   AST 20 03/18/2024   NA 141 07/21/2024   K 4.4 07/21/2024   CL 105 07/21/2024   CREATININE 1.33 (H) 07/21/2024   BUN 22 07/21/2024   CO2 18 (L) 07/21/2024   TSH 1.730 11/18/2023   PSA 0.01 (L) 01/13/2014   INR 1.0 01/09/2023   HGBA1C 5.1 06/06/2022     Assessment & Plan:    See Problem List for Assessment and Plan of chronic medical problems.        [1]  Current Outpatient Medications on File Prior to Visit  Medication Sig Dispense Refill   acetaminophen  (TYLENOL ) 325 MG tablet Take 2 tablets (650 mg total) by mouth every 6 (six) hours as needed for moderate pain. 30 tablet 0   apixaban  (ELIQUIS ) 5 MG TABS tablet Take 1 tablet (5 mg total) by mouth 2 (two) times daily. 60 tablet 5   atorvastatin  (LIPITOR) 40 MG tablet TAKE 1 TABLET(40 MG) BY MOUTH DAILY 90 tablet 0   Cholecalciferol (VITAMIN D3 PO) Take 1 tablet by mouth in the morning.     diltiazem  (CARDIZEM ) 30 MG tablet Take 1 tablet (30 mg total) by mouth every 6 (six) hours as needed (for heart rate greater than 100 bpm.). 60 tablet 3   Multiple Vitamins-Minerals (ADULT ONE DAILY GUMMIES) CHEW Chew 2 tablets by mouth in the morning. Vitafusion Adult Gummy Vitamins for Men     triamcinolone  cream (KENALOG ) 0.1 % APPLY TOPICALLY TO THE AFFECTED AREA TWICE DAILY 30 g 0   No current facility-administered medications on file prior to visit.   "

## 2024-08-06 ENCOUNTER — Ambulatory Visit: Admitting: Internal Medicine

## 2024-08-06 VITALS — BP 132/74 | HR 68 | Temp 98.1°F | Ht 72.0 in | Wt 188.0 lb

## 2024-08-06 DIAGNOSIS — N1831 Chronic kidney disease, stage 3a: Secondary | ICD-10-CM

## 2024-08-06 DIAGNOSIS — D696 Thrombocytopenia, unspecified: Secondary | ICD-10-CM

## 2024-08-06 DIAGNOSIS — I480 Paroxysmal atrial fibrillation: Secondary | ICD-10-CM | POA: Diagnosis not present

## 2024-08-06 DIAGNOSIS — E7800 Pure hypercholesterolemia, unspecified: Secondary | ICD-10-CM | POA: Diagnosis not present

## 2024-08-06 DIAGNOSIS — E7849 Other hyperlipidemia: Secondary | ICD-10-CM

## 2024-08-06 DIAGNOSIS — R7303 Prediabetes: Secondary | ICD-10-CM

## 2024-08-06 DIAGNOSIS — Z8673 Personal history of transient ischemic attack (TIA), and cerebral infarction without residual deficits: Secondary | ICD-10-CM

## 2024-08-06 MED ORDER — ATORVASTATIN CALCIUM 40 MG PO TABS: ORAL_TABLET | ORAL | 3 refills | Status: AC

## 2024-08-06 NOTE — Assessment & Plan Note (Addendum)
Chronic Regular exercise and healthy diet encouraged Continue atorvastatin 40 mg daily 

## 2024-08-06 NOTE — Assessment & Plan Note (Signed)
 Chronic Lab Results  Component Value Date   HGBA1C 5.1 06/06/2022   Low sugar / carb diet Stressed regular exercise

## 2024-08-06 NOTE — Assessment & Plan Note (Addendum)
 Chronic Platelet count variable Recent cbc with lower platelet count but overall stable Bruising from eliquis  but nothing concerning Has appt with hem/onc soon

## 2024-08-06 NOTE — Assessment & Plan Note (Signed)
 Chronic Mild and stable Recent blood work reviewed Continue good water intake, does not take nsaids BP well controlled

## 2024-08-06 NOTE — Assessment & Plan Note (Signed)
 Chronic-history of CVA No residual Continue atorvastatin  40 mg daily, eliquis  5 mg bid Blood pressure well controlled Check CBC, CMP, lipid

## 2024-08-06 NOTE — Assessment & Plan Note (Addendum)
 Chronic Following with cardiology On Eliquis  5 mg twice daily Will take diltiazem  30 mg every 6 hours as needed.

## 2024-08-17 ENCOUNTER — Other Ambulatory Visit: Payer: Self-pay | Admitting: Cardiology

## 2024-08-17 DIAGNOSIS — I480 Paroxysmal atrial fibrillation: Secondary | ICD-10-CM

## 2024-08-24 ENCOUNTER — Telehealth: Payer: Self-pay | Admitting: Cardiology

## 2024-08-24 NOTE — Telephone Encounter (Signed)
 He has a $355 deductible. Once that is paid (probably after second fill), cost should be ~50/month. Aspirin  does not protect against stroke.

## 2024-08-24 NOTE — Telephone Encounter (Signed)
 Informed pt of pharmacy information. Verbalizes understanding, will pick up medication today.

## 2024-09-17 ENCOUNTER — Inpatient Hospital Stay

## 2024-09-17 ENCOUNTER — Ambulatory Visit: Admitting: Medical Oncology

## 2024-09-24 ENCOUNTER — Ambulatory Visit: Admitting: Cardiology

## 2025-02-03 ENCOUNTER — Encounter: Admitting: Internal Medicine
